# Patient Record
Sex: Female | Born: 1955 | Race: White | Hispanic: No | Marital: Single | Smoking: Former smoker
Health system: Southern US, Community
[De-identification: ages and names within clinical notes are randomized; demographics above are authoritative.]

## PROBLEM LIST (undated history)

## (undated) DIAGNOSIS — C189 Malignant neoplasm of colon, unspecified: Secondary | ICD-10-CM

## (undated) DIAGNOSIS — E039 Hypothyroidism, unspecified: Secondary | ICD-10-CM

## (undated) HISTORY — DX: Malignant neoplasm of colon, unspecified: C18.9

---

## 2014-06-03 ENCOUNTER — Emergency Department (HOSPITAL_COMMUNITY): Payer: BC Managed Care – PPO

## 2014-06-03 ENCOUNTER — Emergency Department (HOSPITAL_COMMUNITY)
Admission: EM | Admit: 2014-06-03 | Discharge: 2014-06-03 | Disposition: A | Discharge status: Home / Self Care | Payer: BC Managed Care – PPO | Attending: Emergency Medicine | Admitting: Emergency Medicine

## 2014-06-03 ENCOUNTER — Encounter (HOSPITAL_COMMUNITY): Payer: Self-pay | Admitting: Emergency Medicine

## 2014-06-03 DIAGNOSIS — K529 Noninfective gastroenteritis and colitis, unspecified: Secondary | ICD-10-CM

## 2014-06-03 DIAGNOSIS — R1031 Right lower quadrant pain: Secondary | ICD-10-CM | POA: Diagnosis present

## 2014-06-03 DIAGNOSIS — Z9889 Other specified postprocedural states: Secondary | ICD-10-CM | POA: Insufficient documentation

## 2014-06-03 DIAGNOSIS — R1909 Other intra-abdominal and pelvic swelling, mass and lump: Secondary | ICD-10-CM | POA: Diagnosis not present

## 2014-06-03 DIAGNOSIS — Z79899 Other long term (current) drug therapy: Secondary | ICD-10-CM | POA: Diagnosis not present

## 2014-06-03 DIAGNOSIS — K5289 Other specified noninfective gastroenteritis and colitis: Secondary | ICD-10-CM | POA: Diagnosis not present

## 2014-06-03 LAB — CBC WITH DIFFERENTIAL/PLATELET
BASOS ABS: 0 10*3/uL (ref 0.0–0.1)
BASOS PCT: 0 % (ref 0–1)
EOS PCT: 1 % (ref 0–5)
Eosinophils Absolute: 0.1 10*3/uL (ref 0.0–0.7)
HEMATOCRIT: 35.5 % — AB (ref 36.0–46.0)
Hemoglobin: 12.1 g/dL (ref 12.0–15.0)
Lymphocytes Relative: 14 % (ref 12–46)
Lymphs Abs: 1.6 10*3/uL (ref 0.7–4.0)
MCH: 30 pg (ref 26.0–34.0)
MCHC: 34.1 g/dL (ref 30.0–36.0)
MCV: 88.1 fL (ref 78.0–100.0)
MONO ABS: 1 10*3/uL (ref 0.1–1.0)
Monocytes Relative: 9 % (ref 3–12)
NEUTROS ABS: 9.1 10*3/uL — AB (ref 1.7–7.7)
Neutrophils Relative %: 76 % (ref 43–77)
Platelets: 292 10*3/uL (ref 150–400)
RBC: 4.03 MIL/uL (ref 3.87–5.11)
RDW: 13.8 % (ref 11.5–15.5)
WBC: 11.8 10*3/uL — ABNORMAL HIGH (ref 4.0–10.5)

## 2014-06-03 LAB — URINE MICROSCOPIC-ADD ON

## 2014-06-03 LAB — BASIC METABOLIC PANEL
ANION GAP: 12 (ref 5–15)
BUN: 7 mg/dL (ref 6–23)
CALCIUM: 9.8 mg/dL (ref 8.4–10.5)
CO2: 28 mEq/L (ref 19–32)
CREATININE: 0.85 mg/dL (ref 0.50–1.10)
Chloride: 98 mEq/L (ref 96–112)
GFR calc non Af Amer: 75 mL/min — ABNORMAL LOW (ref >90)
GFR, EST AFRICAN AMERICAN: 87 mL/min — AB (ref >90)
Glucose, Bld: 101 mg/dL — ABNORMAL HIGH (ref 70–99)
Potassium: 4.4 mEq/L (ref 3.7–5.3)
Sodium: 138 mEq/L (ref 137–147)

## 2014-06-03 LAB — URINALYSIS, ROUTINE W REFLEX MICROSCOPIC
BILIRUBIN URINE: NEGATIVE
Glucose, UA: NEGATIVE mg/dL
Hgb urine dipstick: NEGATIVE
KETONES UR: NEGATIVE mg/dL
Nitrite: NEGATIVE
Protein, ur: NEGATIVE mg/dL
Specific Gravity, Urine: <1.005 — ABNORMAL LOW (ref 1.005–1.030)
UROBILINOGEN UA: 0.2 mg/dL (ref 0.0–1.0)
pH: 7 (ref 5.0–8.0)

## 2014-06-03 MED ORDER — ONDANSETRON HCL 4 MG PO TABS: 4.0000 mg | ORAL_TABLET | Freq: Four times a day (QID) | ORAL | Status: DC

## 2014-06-03 MED ORDER — SODIUM CHLORIDE 0.9 % IJ SOLN
INTRAMUSCULAR | Status: AC
  Filled 2014-06-03: qty 750

## 2014-06-03 MED ORDER — SODIUM CHLORIDE 0.9 % IJ SOLN
INTRAMUSCULAR | Status: DC
Start: 2014-06-03 — End: 2014-06-03
  Filled 2014-06-03: qty 60

## 2014-06-03 MED ORDER — CIPROFLOXACIN IN D5W 400 MG/200ML IV SOLN
400.0000 mg | Freq: Once | INTRAVENOUS | Status: AC
  Administered 2014-06-03: 400 mg via INTRAVENOUS
  Filled 2014-06-03: qty 200

## 2014-06-03 MED ORDER — METRONIDAZOLE 500 MG PO TABS: 500.0000 mg | ORAL_TABLET | Freq: Two times a day (BID) | ORAL | Status: DC

## 2014-06-03 MED ORDER — METRONIDAZOLE IN NACL 5-0.79 MG/ML-% IV SOLN
500.0000 mg | Freq: Once | INTRAVENOUS | Status: AC
  Administered 2014-06-03: 500 mg via INTRAVENOUS
  Filled 2014-06-03: qty 100

## 2014-06-03 MED ORDER — IOHEXOL 300 MG/ML  SOLN
50.0000 mL | Freq: Once | INTRAMUSCULAR | Status: AC | PRN
  Administered 2014-06-03: 50 mL via ORAL

## 2014-06-03 MED ORDER — CIPROFLOXACIN HCL 500 MG PO TABS: 500.0000 mg | ORAL_TABLET | Freq: Two times a day (BID) | ORAL | Status: DC

## 2014-06-03 MED ORDER — IOHEXOL 300 MG/ML  SOLN
100.0000 mL | Freq: Once | INTRAMUSCULAR | Status: AC | PRN
  Administered 2014-06-03: 100 mL via INTRAVENOUS

## 2014-06-03 MED ORDER — HYDROCODONE-ACETAMINOPHEN 5-325 MG PO TABS: 2.0000 | ORAL_TABLET | ORAL | Status: DC | PRN

## 2014-06-03 NOTE — Discharge Instructions (Signed)
Abdominal Pain Take the antibiotics as prescribed for inflammation of your colon. Follow up with gastroenterologist for a colonoscopy. Return to the ED if you develop new or worsening symptoms. Many things can cause abdominal pain. Usually, abdominal pain is not caused by a disease and will improve without treatment. It can often be observed and treated at home. Your health care provider will do a physical exam and possibly order blood tests and X-rays to help determine the seriousness of your pain. However, in many cases, more time must pass before a clear cause of the pain can be found. Before that point, your health care provider may not know if you need more testing or further treatment. HOME CARE INSTRUCTIONS  Monitor your abdominal pain for any changes. The following actions may help to alleviate any discomfort you are experiencing:  Only take over-the-counter or prescription medicines as directed by your health care provider.  Do not take laxatives unless directed to do so by your health care provider.  Try a clear liquid diet (broth, tea, or water) as directed by your health care provider. Slowly move to a bland diet as tolerated. SEEK MEDICAL CARE IF:  You have unexplained abdominal pain.  You have abdominal pain associated with nausea or diarrhea.  You have pain when you urinate or have a bowel movement.  You experience abdominal pain that wakes you in the night.  You have abdominal pain that is worsened or improved by eating food.  You have abdominal pain that is worsened with eating fatty foods.  You have a fever. SEEK IMMEDIATE MEDICAL CARE IF:   Your pain does not go away within 2 hours.  You keep throwing up (vomiting).  Your pain is felt only in portions of the abdomen, such as the right side or the left lower portion of the abdomen.  You pass bloody or black tarry stools. MAKE SURE YOU:  Understand these instructions.   Will watch your condition.   Will get  help right away if you are not doing well or get worse.  Document Released: 06/07/2005 Document Revised: 09/02/2013 Document Reviewed: 05/07/2013 [facility] Patient Information 2015 [place], [place]. This information is not intended to replace advice given to you by your health care provider. Make sure you discuss any questions you have with your health care provider.

## 2014-06-03 NOTE — ED Notes (Addendum)
Pt sent from [place] office for further evaluation of RLQ pain that started 5 days ago. Denies fever, denies n/v and reports she has had loose stools. Reports pain "comes and goes". Denies burning or frequency with urination.

## 2014-06-03 NOTE — ED Provider Notes (Signed)
CSN: [id number]     Arrival date & time 06/03/14  1249 History  This chart was scribed for Ezequiel Essex, MD by Molli Posey, ED Scribe. This patient was seen in room APA16A/APA16A and the patient's care was started 3:45 PM.   Chief Complaint  Patient presents with  . Abdominal Pain    The history is provided by the patient. No language interpreter was used.   HPI Comments: Martha Becker is a 58 y.o. female who presents to the Emergency Department complaining of 1 week of constant RLQ abdominal pain. She state the pain is worse with movement and applied pressure. She describes the pain as a pulling sensation. She reports having a similar episode 2 months ago and was prescribed Abx for a possible ovarian cyst. She denies having abdominal US or CT scan at that time. She reports complete resolution of symptoms until onset of current episode. She reports that she is eating and drinking normally. She has not taken any OTC medications for her symptoms. She denies  vomiting, fever, nausea, diarrhea, dysuria, hematuria, vaginal bleeding, vaginal discharge. She denies history of abdominal surgery aside from cesarean section.    History reviewed. No pertinent past medical history. Past Surgical History  Procedure Laterality Date  . Cesarean section     No family history on file. History  Substance Use Topics  . Smoking status: Not on file  . Smokeless tobacco: Not on file  . Alcohol Use: Not on file   OB History   Grav Para Term Preterm Abortions TAB SAB Ect Mult Living                 Review of Systems  A complete 10 system review of systems was obtained and all systems are negative except as noted in the HPI and PMH.   Allergies  Codeine  Home Medications   Prior to Admission medications   Medication Sig Start Date End Date Taking? Authorizing Provider  Calcium Carb-Cholecalciferol (CALCIUM + D3) 600-200 MG-UNIT TABS Take 1 tablet by mouth daily.   Yes Historical Provider, MD   Cholecalciferol (VITAMIN D3) 2000 UNITS TABS Take 2,000 mg by mouth daily.   Yes Historical Provider, MD  ferrous sulfate 325 (65 FE) MG tablet Take 325 mg by mouth daily with breakfast.   Yes Historical Provider, MD  Multiple Vitamin (MULTIVITAMIN) tablet Take 1 tablet by mouth daily.   Yes Historical Provider, MD  Omega-3 Fatty Acids (FISH OIL) 1200 MG CAPS Take 1,200 mg by mouth 2 (two) times daily.   Yes Historical Provider, MD  vitamin B-12 (CYANOCOBALAMIN) 1000 MCG tablet Take 1,000 mcg by mouth daily.   Yes Historical Provider, MD  ciprofloxacin (CIPRO) 500 MG tablet Take 1 tablet (500 mg total) by mouth every 12 (twelve) hours. 06/03/14   Ezequiel Essex, MD  HYDROcodone-acetaminophen (NORCO/VICODIN) 5-325 MG per tablet Take 2 tablets by mouth every 4 (four) hours as needed. 06/03/14   Ezequiel Essex, MD  metroNIDAZOLE (FLAGYL) 500 MG tablet Take 1 tablet (500 mg total) by mouth 2 (two) times daily. 06/03/14   Ezequiel Essex, MD  ondansetron (ZOFRAN) 4 MG tablet Take 1 tablet (4 mg total) by mouth every 6 (six) hours. 06/03/14   Ezequiel Essex, MD   BP 115/77  Pulse 82  Temp(Src) 98.7 F (37.1 C) (Oral)  Resp 14  Ht 5\' 5"  (1.651 m)  Wt 143 lb (64.864 kg)  BMI 23.80 kg/m2  SpO2 100%  Physical Exam  Nursing note and vitals reviewed.  Constitutional: She is oriented to person, place, and time. She appears well-developed and well-nourished. No distress.  Non-toxic, no distress  HENT:  Head: Normocephalic and atraumatic.  Mouth/Throat: Oropharynx is clear and moist. No oropharyngeal exudate.  Eyes: Conjunctivae and EOM are normal. Pupils are equal, round, and reactive to light.  Neck: Normal range of motion. Neck supple.  No meningismus.  Cardiovascular: Normal rate, regular rhythm, normal heart sounds and intact distal pulses.   No murmur heard. Pulmonary/Chest: Effort normal and breath sounds normal. No respiratory distress.  Abdominal: Soft. She exhibits mass (Palpable mass  RLQ). There is tenderness (RLQ). There is no rebound and no guarding.  Genitourinary:  No CVA tenderness  Musculoskeletal: Normal range of motion. She exhibits no edema and no tenderness.  Neurological: She is alert and oriented to person, place, and time. No cranial nerve deficit. She exhibits normal muscle tone. Coordination normal.  No ataxia on finger to nose bilaterally. No pronator drift. 5/5 strength throughout. CN 2-12 intact. Negative Romberg. Equal grip strength. Sensation intact. Gait is normal.   Skin: Skin is warm.  Psychiatric: She has a normal mood and affect. Her behavior is normal.    ED Course  Procedures (including critical care time) DIAGNOSTIC STUDIES: Oxygen Saturation is 99% on RA, normal by my interpretation.    COORDINATION OF CARE: 3:50 PM Discussed treatment plan with pt at bedside and pt agreed to plan.  Labs Review Labs Reviewed  CBC WITH DIFFERENTIAL - Abnormal; Notable for the following:    WBC 11.8 (*)    HCT 35.5 (*)    Neutro Abs 9.1 (*)    All other components within normal limits  BASIC METABOLIC PANEL - Abnormal; Notable for the following:    Glucose, Bld 101 (*)    GFR calc non Af Amer 75 (*)    GFR calc Af Amer 87 (*)    All other components within normal limits  URINALYSIS, ROUTINE W REFLEX MICROSCOPIC - Abnormal; Notable for the following:    Specific Gravity, Urine <1.005 (*)    Leukocytes, UA SMALL (*)    All other components within normal limits  URINE MICROSCOPIC-ADD ON - Abnormal; Notable for the following:    Bacteria, UA FEW (*)    All other components within normal limits    Imaging Review US Transvaginal Non-ob  06/03/2014   CLINICAL DATA:  Pain for 5 days. Right lower quadrant centered. Palpable abnormality.  EXAM: TRANSABDOMINAL AND TRANSVAGINAL ULTRASOUND OF PELVIS  DOPPLER ULTRASOUND OF OVARIES  TECHNIQUE: Both transabdominal and transvaginal ultrasound examinations of the pelvis were performed. Transabdominal technique  was performed for global imaging of the pelvis including uterus, ovaries, adnexal regions, and pelvic cul-de-sac.  It was necessary to proceed with endovaginal exam following the transabdominal exam to visualize the uterus, ovaries, and adnexa. Color and duplex Doppler ultrasound was utilized to evaluate blood flow to the ovaries.  COMPARISON:  None.  FINDINGS: Uterus:  7.7 x 2.5 x 4.1 cm.  Normal in morphology.  Endometrium:  Normal, 5 mm.  Right Ovary:  3.2 x 1.0 x 2.0 cm.  Normal in morphology.  Left Ovary:  2.9 x 1.3 x 1.9 cm.  Normal in morphology.  Other Findings:  Trace free pelvic fluid.  Pulsed Doppler evaluation of both ovaries demonstrates normal low-resistance arterial and venous waveforms.  IMPRESSION:  1. Normal appearance of the uterus and ovaries, without evidence of ovarian or adnexal torsion. 2. Trace fluid in the pelvis, likely related to the right-sided  colonic abnormality which will be detailed on CT.   Electronically Signed   By: Abigail Miyamoto M.D.   On: 06/03/2014 17:29   US Pelvis Complete  06/03/2014   CLINICAL DATA:  Pain for 5 days. Right lower quadrant centered. Palpable abnormality.  EXAM: TRANSABDOMINAL AND TRANSVAGINAL ULTRASOUND OF PELVIS  DOPPLER ULTRASOUND OF OVARIES  TECHNIQUE: Both transabdominal and transvaginal ultrasound examinations of the pelvis were performed. Transabdominal technique was performed for global imaging of the pelvis including uterus, ovaries, adnexal regions, and pelvic cul-de-sac.  It was necessary to proceed with endovaginal exam following the transabdominal exam to visualize the uterus, ovaries, and adnexa. Color and duplex Doppler ultrasound was utilized to evaluate blood flow to the ovaries.  COMPARISON:  None.  FINDINGS: Uterus:  7.7 x 2.5 x 4.1 cm.  Normal in morphology.  Endometrium:  Normal, 5 mm.  Right Ovary:  3.2 x 1.0 x 2.0 cm.  Normal in morphology.  Left Ovary:  2.9 x 1.3 x 1.9 cm.  Normal in morphology.  Other Findings:  Trace free pelvic  fluid.  Pulsed Doppler evaluation of both ovaries demonstrates normal low-resistance arterial and venous waveforms.  IMPRESSION:  1. Normal appearance of the uterus and ovaries, without evidence of ovarian or adnexal torsion. 2. Trace fluid in the pelvis, likely related to the right-sided colonic abnormality which will be detailed on CT.   Electronically Signed   By: Abigail Miyamoto M.D.   On: 06/03/2014 17:29   Ct Abdomen Pelvis W Contrast  06/03/2014   CLINICAL DATA:  58 year old female with right lower quadrant pain intermittent for 3 weeks. Previous ovarian cyst several months prior.  EXAM: CT ABDOMEN AND PELVIS WITH CONTRAST  TECHNIQUE: Multidetector CT imaging of the abdomen and pelvis was performed using the standard protocol following bolus administration of intravenous contrast.  CONTRAST:  26mL OMNIPAQUE IOHEXOL 300 MG/ML SOLN, 152mL OMNIPAQUE IOHEXOL 300 MG/ML SOLN  COMPARISON:  No prior CT for comparison.  FINDINGS: Chest:  Unremarkable appearance of the soft tissues of the chest wall. Respiratory motion somewhat limits evaluation of the visualized lungs for small nodules. No pleural effusion or visualized pneumothorax.  Heart size within normal limits.  No pericardial fluid/ thickening.  Abdomen:  Craniocaudal span of the liver measures approximately 15 cm. There are a few small hypodense rounded lesions within the right liver lobe, image 10, 18, 16, 25. All of these are too small to characterize though are most likely benign biliary cysts. Focal fatty infiltration adjacent to the false form ligament. The intervening liver parenchyma is unremarkable in attenuation/ enhancement.  Unremarkable appearance of the spleen.  Uniform attenuation/enhancement of pancreatic parenchyma. No pericholecystic fluid or inflammatory changes. No radiopaque gallstones. No intrahepatic or extrahepatic biliary ductal dilatation.  Unremarkable appearance of the kidneys without hydronephrosis or nephrolithiasis.  No abnormally  distended small bowel or colon.  Inflammatory changes are present at the wall of the cecum and proximal ascending colon with edema in the adjacent mesenteric. There are several borderline enlarged lymph nodes in the in the ileocolic nodal station. No evidence of associated free air.  Unremarkable appearance of the urinary bladder. Unremarkable appearance of the uterus and adnexum.  Trace free fluid within the pelvis.  Vascular:  No evidence of aneurysm, dissection flap, or periaortic fluid. No significant atherosclerotic changes.  IMPRESSION: Nonspecific inflammatory changes involving the cecum and proximal ascending colon, with borderline enlarged lymph nodes in the ileocolic nodal station. Differential diagnosis includes infectious/inflammatory process such as enterocolitis, primary conditions such  as a inflammatory bowel syndrome, or potentially diverticulitis. Once the patient has been treated for this acute process, she should be referred for GI evaluation and colonoscopy, as malignancy could have this appearance.  Signed,  Dulcy Fanny. Earleen Newport, DO  [facility]  [facility]   Electronically Signed   By: Corrie Mckusick O.D.   On: 06/03/2014 18:01   [place] Art/ven Flow Abd Pelv Doppler  06/03/2014   CLINICAL DATA:  Pain for 5 days. Right lower quadrant centered. Palpable abnormality.  EXAM: TRANSABDOMINAL AND TRANSVAGINAL ULTRASOUND OF PELVIS  DOPPLER ULTRASOUND OF OVARIES  TECHNIQUE: Both transabdominal and transvaginal ultrasound examinations of the pelvis were performed. Transabdominal technique was performed for global imaging of the pelvis including uterus, ovaries, adnexal regions, and pelvic cul-de-sac.  It was necessary to proceed with endovaginal exam following the transabdominal exam to visualize the uterus, ovaries, and adnexa. Color and duplex Doppler ultrasound was utilized to evaluate blood flow to the ovaries.  COMPARISON:  None.  FINDINGS: Uterus:  7.7 x 2.5  x 4.1 cm.  Normal in morphology.  Endometrium:  Normal, 5 mm.  Right Ovary:  3.2 x 1.0 x 2.0 cm.  Normal in morphology.  Left Ovary:  2.9 x 1.3 x 1.9 cm.  Normal in morphology.  Other Findings:  Trace free pelvic fluid.  Pulsed Doppler evaluation of both ovaries demonstrates normal low-resistance arterial and venous waveforms.  IMPRESSION:  1. Normal appearance of the uterus and ovaries, without evidence of ovarian or adnexal torsion. 2. Trace fluid in the pelvis, likely related to the right-sided colonic abnormality which will be detailed on CT.   Electronically Signed   By: Abigail Miyamoto M.D.   On: 06/03/2014 17:29     EKG Interpretation None      MDM   Final diagnoses:  Right lower quadrant abdominal pain  Colitis   Several month history of right lower quadrant abdominal pain intermittent but constant for the past one week. No associated symptoms. No fever.  WBC 11.8, UA negative. Normal uterus and ovaries on [place], no evidence of torsion.  CT scan shows inflammatory change at cecum and proximal descending colon. Differential discussed with patient including risk of malignancy. Discussed with Dr. Earleen Newport radiology. Appendix is normal.  Discussed with Dr. Gala Romney who agrees with treating with antibiotics at this time and he will arrange followup for colonoscopy.  Patient understands need for GI followup and risk of possible malignancy.  Will discharge with cipro, flagyl, pain control.  Follow up with GI.  Return to the ED with new or worsening symptoms.  BP 115/77  Pulse 82  Temp(Src) 98.7 F (37.1 C) (Oral)  Resp 14  Ht 5\' 5"  (1.651 m)  Wt 143 lb (64.864 kg)  BMI 23.80 kg/m2  SpO2 100%  I personally performed the services described in this documentation, which was scribed in my presence. The recorded information has been reviewed and is accurate.     Ezequiel Essex, MD 06/03/14 [phone]

## 2014-06-03 NOTE — ED Notes (Signed)
Pt back from US/CT

## 2014-06-04 ENCOUNTER — Encounter: Payer: Self-pay | Admitting: Internal Medicine

## 2014-07-01 ENCOUNTER — Ambulatory Visit: Payer: BC Managed Care – PPO | Admitting: Gastroenterology

## 2014-07-14 ENCOUNTER — Telehealth: Payer: Self-pay | Admitting: Gastroenterology

## 2014-07-14 NOTE — Telephone Encounter (Signed)
I called and LMOM for pt to return call.

## 2014-07-14 NOTE — Telephone Encounter (Signed)
I received a referral on this patient once before and she cancelled her OV because she wanted to pick her own GI doctor. I received another referral today from Dr Nadara Mustard and called patient to see if she wanted to make OV or not. She agreed to be seen on 12/11 at 1030 with LSL, but wants to be seen sooner and have her procedure done ASAP and she has multiple medical questions with what is going on with her. I told her that I would let the nurse review her records and call her back at [phone]

## 2014-07-15 ENCOUNTER — Telehealth: Payer: Self-pay

## 2014-07-15 NOTE — Telephone Encounter (Addendum)
I returned pt's call. She stated she first started having problems with her stomach at the end of June. She has been on antibiotics several times. At this time she is feeling much better and has gone back to work.  Her CT of abdomen and pelvis done on 06/03/2014 at [facility]:  Impression: Nonspecific inflammatory changes involving the cecum and proximal ascending colon, with borderline enlarged lymph nodes.  Differential Diagnosis includes infectious/inflammatory process such as enterocolitis, primary conditions such a inflammatory bowel syndrome or potentially diverticulitis. Once the pt has been treated for this acute process, she should be referred for GI evaluation and colonosocpy, as malignancy could have this appearance.  I told the patient that since she is doing better and I do not any earlier appts at this time, I will leave her on the schedule for 08/21/2014 at 10:30 Am with Neil Crouch, PA.   She agreed to call her PCP or here if her symptoms worsen and if she has any severe pain she will go to the ED.  I told her we will put her on the cancellation list and she said if you find out a day before it would be very helpful, instead of day of.

## 2014-07-15 NOTE — Telephone Encounter (Signed)
Agree 

## 2014-07-15 NOTE — Telephone Encounter (Signed)
See note dated 07/14/2014.

## 2014-07-15 NOTE — Telephone Encounter (Signed)
PATIENT RETURNED [place], [place]

## 2014-08-11 DIAGNOSIS — C189 Malignant neoplasm of colon, unspecified: Secondary | ICD-10-CM

## 2014-08-11 HISTORY — DX: Malignant neoplasm of colon, unspecified: C18.9

## 2014-08-21 ENCOUNTER — Other Ambulatory Visit: Payer: Self-pay

## 2014-08-21 ENCOUNTER — Ambulatory Visit (INDEPENDENT_AMBULATORY_CARE_PROVIDER_SITE_OTHER): Payer: BC Managed Care – PPO | Admitting: Gastroenterology

## 2014-08-21 ENCOUNTER — Telehealth: Payer: Self-pay | Admitting: Gastroenterology

## 2014-08-21 ENCOUNTER — Encounter: Payer: Self-pay | Admitting: Gastroenterology

## 2014-08-21 VITALS — BP 107/70 | HR 78 | Temp 98.9°F | Ht 66.0 in | Wt 148.0 lb

## 2014-08-21 DIAGNOSIS — R933 Abnormal findings on diagnostic imaging of other parts of digestive tract: Secondary | ICD-10-CM

## 2014-08-21 DIAGNOSIS — R1031 Right lower quadrant pain: Secondary | ICD-10-CM | POA: Insufficient documentation

## 2014-08-21 MED ORDER — CIPROFLOXACIN HCL 500 MG PO TABS: 500.0000 mg | ORAL_TABLET | Freq: Two times a day (BID) | ORAL | Status: DC

## 2014-08-21 MED ORDER — METRONIDAZOLE 500 MG PO TABS: 500.0000 mg | ORAL_TABLET | Freq: Three times a day (TID) | ORAL | Status: DC

## 2014-08-21 MED ORDER — PEG-KCL-NACL-NASULF-NA ASC-C 100 G PO SOLR: 1.0000 | Freq: Once | ORAL | Status: DC

## 2014-08-21 NOTE — Telephone Encounter (Signed)
Candy is aware.

## 2014-08-21 NOTE — Patient Instructions (Signed)
1. RX for cipro and flagyl sent to your pharmacy. 2. Colonoscopy with Dr. Oneida Alar as scheduled.  3. Hold iron for now.

## 2014-08-21 NOTE — Progress Notes (Signed)
Primary Care Physician:  [name], MD  Primary Gastroenterologist:  Barney Drain, MD   Chief Complaint  Patient presents with  . Abdominal Pain    R side since June 2015    HPI:  Martha Becker is a 58 y.o. female here for further evaluation of abdominal pain and abnormal CT scan. Patient referred by Dr. [name]. Patient reports first episode of abdominal pain dating back to June 2015. Patient states she was told that she may have had a ruptured ovarian cyst and was given antibiotic therapy. Symptoms improved. Next episode began in September at which time she was evaluated in the emergency department. She had a CT of the abdomen and pelvis with contrast which revealed a few small hypodense rounded lesions within the right liver lobe too small to characterize, focal fatty infiltration adjacent to the falciform ligament, inflammatory changes present at the wall of the cecum and proximal ascending colon with edema in the adjacent mesenteric. Several borderline enlarged lymph nodes in the ileocolic nodal station. White count was mildly elevated 11,800 at that time. Normal hemoglobin. Again treated with antibiotic therapy and patient felt she improved. Patient reports that she was referred to GI at that time but she was feeling better and canceled the appointment. She developed recurrent abdominal pain again in October and treated with antibiotics for third time. Patient states current episode began about 2 days ago when she started Flagyl that she had left over at home. Feels slightly better at this time.  Pain is located in the right lower quadrant/right mid abdomen, cramping in nature. Bowel movements generally normal except for on antibiotics the stools become looser. Complains of associated fatigue. She has known mildly elevated TSH but she's been trying to use naturalistic products/foods to improve this. Patient also reports couple month h/o feeling a fullness or mass in RLQ. Notes it was  present when she had her CT done. No gerd, dysphagia, weight loss. Appetite is good.     Current Outpatient Prescriptions  Medication Sig Dispense Refill  . Calcium Carb-Cholecalciferol (CALCIUM + D3) 600-200 MG-UNIT TABS Take 1 tablet by mouth daily.    . ferrous sulfate 325 (65 FE) MG tablet Take 325 mg by mouth daily with breakfast.    . metroNIDAZOLE (FLAGYL) 500 MG tablet Take 1 tablet (500 mg total) by mouth 2 (two) times daily. 30 tablet 0  . Multiple Vitamin (MULTIVITAMIN) tablet Take 1 tablet by mouth daily.    . Omega-3 Fatty Acids (FISH OIL) 1200 MG CAPS Take 1,200 mg by mouth 2 (two) times daily.    . Probiotic Product (PROBIOTIC DAILY PO) Take by mouth daily.    . vitamin B-12 (CYANOCOBALAMIN) 1000 MCG tablet Take 1,000 mcg by mouth daily.     No current facility-administered medications for this visit.    Allergies as of 08/21/2014 - Review Complete 08/21/2014  Allergen Reaction Noted  . Codeine Nausea And Vomiting 06/03/2014    No past medical history on file.  Past Surgical History  Procedure Laterality Date  . Cesarean section      Family History  Problem Relation Age of Onset  . Colon cancer Cousin     age late 15s  . Inflammatory bowel disease Neg Hx     History   Social History  . Marital Status: Single    Spouse Name: N/A    Number of Children: 2  . Years of Education: N/A   Occupational History  . Not on file.  Social History Main Topics  . Smoking status: Never Smoker   . Smokeless tobacco: Not on file  . Alcohol Use: 0.0 oz/week    0 Not specified per week     Comment: occasional  . Drug Use: No  . Sexual Activity: Not on file   Other Topics Concern  . Not on file   Social History Narrative      ROS:  General: +fatigue. No weight loss.  Eyes: Negative for vision changes.  ENT: Negative for hoarseness, difficulty swallowing , nasal congestion. CV: Negative for chest pain, angina, palpitations, dyspnea on exertion, peripheral  edema.  Respiratory: Negative for dyspnea at rest, dyspnea on exertion, cough, sputum, wheezing.  GI: See history of present illness. GU:  Negative for dysuria, hematuria, urinary incontinence, urinary frequency, nocturnal urination.  MS: Negative for joint pain, low back pain.  Derm: Negative for rash or itching.  Neuro: Negative for weakness, abnormal sensation, seizure, frequent headaches, memory loss, confusion.  Psych: Negative for anxiety, depression, suicidal ideation, hallucinations.  Endo: Negative for unusual weight change.  Heme: Negative for bruising or bleeding. Allergy: Negative for rash or hives.    Physical Examination:  BP 107/70 mmHg  Pulse 78  Temp(Src) 98.9 F (37.2 C) (Oral)  Ht 5\' 6"  (1.676 m)  Wt 148 lb (67.132 kg)  BMI 23.90 kg/m2   General: Well-nourished, well-developed in no acute distress.  Head: Normocephalic, atraumatic.   Eyes: Conjunctiva pink, no icterus. Mouth: Oropharyngeal mucosa moist and pink , no lesions erythema or exudate. Neck: Supple without thyromegaly, masses, or lymphadenopathy.  Lungs: Clear to auscultation bilaterally.  Heart: Regular rate and rhythm, no murmurs rubs or gallops.  Abdomen: Bowel sounds are normal. She has fullness noted in the right mid abd extending to RLQ for several inches. Tender with palpation, no guarding. No hepatosplenomegaly, no abdominal bruits or    hernia , no rebound or guarding.   Rectal: not performed Extremities: No lower extremity edema. No clubbing or deformities.  Neuro: Alert and oriented x 4 , grossly normal neurologically.  Skin: Warm and dry, no rash or jaundice.   Psych: Alert and cooperative, normal mood and affect.  Labs: Lab Results  Component Value Date   WBC 11.8* 06/03/2014   HGB 12.1 06/03/2014   HCT 35.5* 06/03/2014   MCV 88.1 06/03/2014   PLT 292 06/03/2014   Lab Results  Component Value Date   CREATININE 0.85 06/03/2014   BUN 7 06/03/2014   NA 138 06/03/2014   K 4.4  06/03/2014   CL 98 06/03/2014   CO2 28 06/03/2014   Labs from 06/29/2014 White blood cell count 7400, hemoglobin 11.4 normal, hematocrit 34.6 normal, MCV 89, platelets 478,000, BUN 9, creatinine 0.9, total bilirubin less than 0.2, alkaline phosphatase 47, AST 14, ALT 10, albumin 3.9, TSH 7.280  Imaging Studies:  CLINICAL DATA: 58 year old female with right lower quadrant pain intermittent for 3 weeks. Previous ovarian cyst several months prior.  EXAM: CT ABDOMEN AND PELVIS WITH CONTRAST  TECHNIQUE: Multidetector CT imaging of the abdomen and pelvis was performed using the standard protocol following bolus administration of intravenous contrast.  CONTRAST: 15mL OMNIPAQUE IOHEXOL 300 MG/ML SOLN, 175mL OMNIPAQUE IOHEXOL 300 MG/ML SOLN  COMPARISON: No prior CT for comparison.  FINDINGS: Chest:  Unremarkable appearance of the soft tissues of the chest wall. Respiratory motion somewhat limits evaluation of the visualized lungs for small nodules. No pleural effusion or visualized pneumothorax.  Heart size within normal limits. No pericardial fluid/ thickening.  Abdomen:  Craniocaudal span of the liver measures approximately 15 cm. There are a few small hypodense rounded lesions within the right liver lobe, image 10, 18, 16, 25. All of these are too small to characterize though are most likely benign biliary cysts. Focal fatty infiltration adjacent to the false form ligament. The intervening liver parenchyma is unremarkable in attenuation/ enhancement.  Unremarkable appearance of the spleen.  Uniform attenuation/enhancement of pancreatic parenchyma. No pericholecystic fluid or inflammatory changes. No radiopaque gallstones. No intrahepatic or extrahepatic biliary ductal dilatation.  Unremarkable appearance of the kidneys without hydronephrosis or nephrolithiasis.  No abnormally distended small bowel or colon.  Inflammatory changes are present at the  wall of the cecum and proximal ascending colon with edema in the adjacent mesenteric. There are several borderline enlarged lymph nodes in the in the ileocolic nodal station. No evidence of associated free air.  Unremarkable appearance of the urinary bladder. Unremarkable appearance of the uterus and adnexum.  Trace free fluid within the pelvis.  Vascular:  No evidence of aneurysm, dissection flap, or periaortic fluid. No significant atherosclerotic changes.  IMPRESSION: Nonspecific inflammatory changes involving the cecum and proximal ascending colon, with borderline enlarged lymph nodes in the ileocolic nodal station. Differential diagnosis includes infectious/inflammatory process such as enterocolitis, primary conditions such as a inflammatory bowel syndrome, or potentially diverticulitis. Once the patient has been treated for this acute process, she should be referred for GI evaluation and colonoscopy, as malignancy could have this appearance.  Signed,  Dulcy Fanny. Earleen Newport, DO  [facility]  [facility]   Electronically Signed  By: Corrie Mckusick O.D.  On: 06/03/2014 18:01

## 2014-08-21 NOTE — Telephone Encounter (Signed)
Please let patient know that Dr. Oneida Alar is requesting she have a CT A/P with contrast prior to her scheduled colonoscopy. Need to get it done sometime early next week. Please schedule.  Her last creatinine was 0.9 on 06/19/14.

## 2014-08-24 ENCOUNTER — Telehealth: Payer: Self-pay

## 2014-08-24 ENCOUNTER — Other Ambulatory Visit: Payer: Self-pay

## 2014-08-24 DIAGNOSIS — R933 Abnormal findings on diagnostic imaging of other parts of digestive tract: Secondary | ICD-10-CM

## 2014-08-24 DIAGNOSIS — R1031 Right lower quadrant pain: Secondary | ICD-10-CM

## 2014-08-24 NOTE — Assessment & Plan Note (Signed)
58 year old female with recurrent right lower quadrant pain and abnormal CT scan as outlined above. While the patient was here today, I spoke with radiologist Dr. Weber Cooks about her September CT scan. My concern was that she has recurrent abdominal pain and a palpable fullness/mass in the right mid to lower quadrant. He reports the previous CT scan showed significant abnormality in that region extending approximately 10 cm in length. He did not feel it was related to appendicitis and actually in retrospect findings may be more consistent with mass rather than inflammatory component. He encouraged colonoscopy. I discussed findings with Dr. Oneida Alar. Dr. Oneida Alar recommended updated CT scan prior to colonoscopy given current CT is 70 months old. Based on findings will likely proceed with colonoscopy. Ddx includes malignancy, IBD.  Discussed plan with patient. She is in agreement. Discussed colonoscopy as we will likely proceed with this as the next step.  I have discussed the risks, alternatives, benefits with regards to but not limited to the risk of reaction to medication, bleeding, infection, perforation and the patient is agreeable to proceed. Written consent to be obtained.  Patient requested continuing antibiotic therapy until further work up is done. 10 day course of cipro/flagyl provided.

## 2014-08-24 NOTE — Telephone Encounter (Signed)
Called pt insurance and no pre-cert is needed.  Ref. # [id number].  Procedure is scheduled for 08/27/2014 @ 1000am. Called pt to notify her. No answer. LMOM and asked pt to call office back to verify orders.

## 2014-08-24 NOTE — Telephone Encounter (Signed)
Pt called this am in regards to her CT Scan that was ordered. Pt is upset and is wondering why she is having to have another one since she just had one 3 months ago. I told pt that the doctors are wanting to compare the previous scan with the new one ordered. She stated that having another scan is an expensive comparison and wants to talk to Neil Crouch, [place].

## 2014-08-24 NOTE — Telephone Encounter (Signed)
Ok thanks 

## 2014-08-24 NOTE — Telephone Encounter (Signed)
Discussed need for CT with patient. Patient took antibiotics for 2 days but had to stop due to nausea and pain. She will call and reschedule her CT hopefully for tomorrow.

## 2014-08-24 NOTE — Progress Notes (Signed)
cc'ed to pcp °

## 2014-08-25 ENCOUNTER — Telehealth: Payer: Self-pay | Admitting: Gastroenterology

## 2014-08-25 NOTE — Telephone Encounter (Signed)
PATIENT HAD A QUESTION ABOUT INFECTED AREA IN THE COLON SEEN ON CT SCAN IN October.  [place] HER [id number]

## 2014-08-26 NOTE — Telephone Encounter (Signed)
I spoke with the pt, she wants to know again what the size of the part of the colon that we were worried about. I talked to LSL- area is approximately 10cm or 5 inches. I informed pt and she said she understood. Her ct is scheduled for tomorrow.

## 2014-08-26 NOTE — Telephone Encounter (Signed)
Martha Becker, please touch base with patient first.

## 2014-08-26 NOTE — Telephone Encounter (Signed)
Tried to call pt- LMOM to return call 

## 2014-08-27 ENCOUNTER — Telehealth: Payer: Self-pay | Admitting: Internal Medicine

## 2014-08-27 ENCOUNTER — Ambulatory Visit (HOSPITAL_COMMUNITY): Payer: BC Managed Care – PPO

## 2014-08-27 ENCOUNTER — Ambulatory Visit (HOSPITAL_COMMUNITY)
Admission: RE | Admit: 2014-08-27 | Discharge: 2014-08-27 | Disposition: A | Discharge status: Home / Self Care | Payer: BC Managed Care – PPO | Source: Ambulatory Visit | Attending: Gastroenterology | Admitting: Gastroenterology

## 2014-08-27 ENCOUNTER — Telehealth: Payer: Self-pay | Admitting: Gastroenterology

## 2014-08-27 DIAGNOSIS — R140 Abdominal distension (gaseous): Secondary | ICD-10-CM

## 2014-08-27 DIAGNOSIS — C7801 Secondary malignant neoplasm of right lung: Secondary | ICD-10-CM | POA: Diagnosis present

## 2014-08-27 DIAGNOSIS — R933 Abnormal findings on diagnostic imaging of other parts of digestive tract: Secondary | ICD-10-CM | POA: Insufficient documentation

## 2014-08-27 DIAGNOSIS — C182 Malignant neoplasm of ascending colon: Principal | ICD-10-CM | POA: Diagnosis present

## 2014-08-27 DIAGNOSIS — K56600 Partial intestinal obstruction, unspecified as to cause: Secondary | ICD-10-CM

## 2014-08-27 DIAGNOSIS — R1031 Right lower quadrant pain: Secondary | ICD-10-CM | POA: Insufficient documentation

## 2014-08-27 DIAGNOSIS — C7802 Secondary malignant neoplasm of left lung: Secondary | ICD-10-CM | POA: Diagnosis present

## 2014-08-27 DIAGNOSIS — D638 Anemia in other chronic diseases classified elsewhere: Secondary | ICD-10-CM | POA: Diagnosis present

## 2014-08-27 DIAGNOSIS — K56609 Unspecified intestinal obstruction, unspecified as to partial versus complete obstruction: Secondary | ICD-10-CM

## 2014-08-27 DIAGNOSIS — C786 Secondary malignant neoplasm of retroperitoneum and peritoneum: Secondary | ICD-10-CM | POA: Diagnosis present

## 2014-08-27 DIAGNOSIS — R112 Nausea with vomiting, unspecified: Secondary | ICD-10-CM | POA: Diagnosis present

## 2014-08-27 MED ORDER — IOHEXOL 300 MG/ML  SOLN
100.0000 mL | Freq: Once | INTRAMUSCULAR | Status: AC | PRN
  Administered 2014-08-27: 100 mL via INTRAVENOUS

## 2014-08-27 NOTE — Telephone Encounter (Signed)
Pt called this afternoon. She had a CT done today around 1pm and afterwards ate a biscuit and now she is having severe abdominal pains and is crying on the phone because she doesn't know what to do.  She was seen recently and said she has had problems with being bloated, but today she is having abdomen pains. Please advise. Call transferred to [facility]

## 2014-08-27 NOTE — Telephone Encounter (Signed)
I spoke with the pt. She said she felt ok immediately after the ct but on the way home, she stopped for something to eat and began to have severe nausea. She said she has had 3 episodes of diarrhea since she got home. Pt sounds winded on th phone but when I asked her if she had SOB, she said she didn't know if her breathing was really SOB or if she was doing it to try to control her nausea. I spoke with Randall Hiss, ok for pt to take benadryl 25mg  and she needs to sit down and try to relax. If her throats starts to swell she needs to go to the ED. Pt stated her throat feels fine, she will try the benadryl and I will call pt back to check on her before I leave today.

## 2014-08-27 NOTE — Telephone Encounter (Signed)
Radiology called with CT report.  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 I called pt this evening.   Pt vomited after scan after eating a "biscuit"; feels better this pm; +BM today; no nausea or vomiting currently.  I informed her of current CT findings. I recommended she come to hospital or wait until am to get instructions from Dr. Oneida Alar.  She stated she was doing Ok at home.  I instructed her to back off on diet to clear liquids; if worsening of symptoms come to the ED; she voiced understanding of the plan.

## 2014-08-27 NOTE — Telephone Encounter (Signed)
Tried to call pt- LMOM- informed pt that I was calling to check up on her and reminded her that if she started developing hives or severe SOB or she feels like her throat is closing she should go to the ED. Asked her to call me in the AM and let me know how she is doing.

## 2014-08-28 ENCOUNTER — Inpatient Hospital Stay (HOSPITAL_COMMUNITY): Payer: BC Managed Care – PPO

## 2014-08-28 ENCOUNTER — Inpatient Hospital Stay (HOSPITAL_COMMUNITY)
Admission: AD | Admit: 2014-08-28 | Discharge: 2014-09-04 | DRG: 330 | Disposition: A | Discharge status: Home / Self Care | Payer: BC Managed Care – PPO | Source: Ambulatory Visit | Attending: General Surgery | Admitting: General Surgery

## 2014-08-28 ENCOUNTER — Encounter: Payer: Self-pay | Admitting: Internal Medicine

## 2014-08-28 ENCOUNTER — Encounter (HOSPITAL_COMMUNITY): Payer: Self-pay | Admitting: *Deleted

## 2014-08-28 DIAGNOSIS — C7800 Secondary malignant neoplasm of unspecified lung: Secondary | ICD-10-CM | POA: Diagnosis present

## 2014-08-28 DIAGNOSIS — R112 Nausea with vomiting, unspecified: Secondary | ICD-10-CM | POA: Diagnosis present

## 2014-08-28 DIAGNOSIS — R1031 Right lower quadrant pain: Secondary | ICD-10-CM | POA: Diagnosis present

## 2014-08-28 DIAGNOSIS — R918 Other nonspecific abnormal finding of lung field: Secondary | ICD-10-CM | POA: Diagnosis present

## 2014-08-28 DIAGNOSIS — C801 Malignant (primary) neoplasm, unspecified: Secondary | ICD-10-CM

## 2014-08-28 DIAGNOSIS — C7801 Secondary malignant neoplasm of right lung: Secondary | ICD-10-CM | POA: Diagnosis present

## 2014-08-28 DIAGNOSIS — C7802 Secondary malignant neoplasm of left lung: Secondary | ICD-10-CM | POA: Diagnosis present

## 2014-08-28 DIAGNOSIS — K5660 Unspecified intestinal obstruction: Secondary | ICD-10-CM | POA: Diagnosis present

## 2014-08-28 DIAGNOSIS — D638 Anemia in other chronic diseases classified elsewhere: Secondary | ICD-10-CM | POA: Diagnosis present

## 2014-08-28 DIAGNOSIS — C786 Secondary malignant neoplasm of retroperitoneum and peritoneum: Secondary | ICD-10-CM | POA: Diagnosis present

## 2014-08-28 DIAGNOSIS — K56609 Unspecified intestinal obstruction, unspecified as to partial versus complete obstruction: Secondary | ICD-10-CM | POA: Diagnosis present

## 2014-08-28 DIAGNOSIS — C180 Malignant neoplasm of cecum: Secondary | ICD-10-CM | POA: Diagnosis present

## 2014-08-28 DIAGNOSIS — C182 Malignant neoplasm of ascending colon: Secondary | ICD-10-CM | POA: Diagnosis present

## 2014-08-28 DIAGNOSIS — Z01811 Encounter for preprocedural respiratory examination: Secondary | ICD-10-CM

## 2014-08-28 LAB — PROTIME-INR
INR: 1.08 (ref 0.00–1.49)
Prothrombin Time: 14.1 seconds (ref 11.6–15.2)

## 2014-08-28 LAB — CBC
HEMATOCRIT: 34.3 % — AB (ref 36.0–46.0)
Hemoglobin: 11.7 g/dL — ABNORMAL LOW (ref 12.0–15.0)
MCH: 29.2 pg (ref 26.0–34.0)
MCHC: 34.1 g/dL (ref 30.0–36.0)
MCV: 85.5 fL (ref 78.0–100.0)
Platelets: 459 10*3/uL — ABNORMAL HIGH (ref 150–400)
RBC: 4.01 MIL/uL (ref 3.87–5.11)
RDW: 14.4 % (ref 11.5–15.5)
WBC: 9.3 10*3/uL (ref 4.0–10.5)

## 2014-08-28 LAB — COMPREHENSIVE METABOLIC PANEL
ALBUMIN: 3.2 g/dL — AB (ref 3.5–5.2)
ALT: 12 U/L (ref 0–35)
AST: 20 U/L (ref 0–37)
Alkaline Phosphatase: 63 U/L (ref 39–117)
Anion gap: 14 (ref 5–15)
BUN: 5 mg/dL — ABNORMAL LOW (ref 6–23)
CO2: 25 mEq/L (ref 19–32)
CREATININE: 0.75 mg/dL (ref 0.50–1.10)
Calcium: 9.2 mg/dL (ref 8.4–10.5)
Chloride: 93 mEq/L — ABNORMAL LOW (ref 96–112)
GFR calc Af Amer: >90 mL/min (ref >90)
GFR calc non Af Amer: >90 mL/min (ref >90)
Glucose, Bld: 107 mg/dL — ABNORMAL HIGH (ref 70–99)
Potassium: 4 mEq/L (ref 3.7–5.3)
SODIUM: 132 meq/L — AB (ref 137–147)
Total Bilirubin: 0.2 mg/dL — ABNORMAL LOW (ref 0.3–1.2)
Total Protein: 6.6 g/dL (ref 6.0–8.3)

## 2014-08-28 MED ORDER — CIPROFLOXACIN IN D5W 400 MG/200ML IV SOLN
400.0000 mg | Freq: Two times a day (BID) | INTRAVENOUS | Status: DC
  Administered 2014-08-28 – 2014-09-03 (×11): 400 mg via INTRAVENOUS
  Filled 2014-08-28 (×11): qty 200

## 2014-08-28 MED ORDER — SODIUM CHLORIDE 0.9 % IJ SOLN: 3.0000 mL | INTRAMUSCULAR | Status: DC | PRN

## 2014-08-28 MED ORDER — ACETAMINOPHEN 325 MG PO TABS: 650.0000 mg | ORAL_TABLET | Freq: Four times a day (QID) | ORAL | Status: DC | PRN

## 2014-08-28 MED ORDER — SODIUM CHLORIDE 0.9 % IJ SOLN
3.0000 mL | Freq: Two times a day (BID) | INTRAMUSCULAR | Status: DC
  Administered 2014-08-28 – 2014-09-04 (×7): 3 mL via INTRAVENOUS

## 2014-08-28 MED ORDER — ONDANSETRON HCL 4 MG/2ML IJ SOLN
4.0000 mg | Freq: Four times a day (QID) | INTRAMUSCULAR | Status: DC | PRN
  Administered 2014-08-30 – 2014-09-04 (×8): 4 mg via INTRAVENOUS
  Filled 2014-08-28 (×9): qty 2

## 2014-08-28 MED ORDER — ONDANSETRON HCL 4 MG PO TABS: 4.0000 mg | ORAL_TABLET | Freq: Four times a day (QID) | ORAL | Status: DC | PRN

## 2014-08-28 MED ORDER — SODIUM CHLORIDE 0.9 % IV SOLN
250.0000 mL | INTRAVENOUS | Status: DC | PRN
  Administered 2014-08-28: 250 mL via INTRAVENOUS

## 2014-08-28 MED ORDER — ACETAMINOPHEN 650 MG RE SUPP: 650.0000 mg | Freq: Four times a day (QID) | RECTAL | Status: DC | PRN

## 2014-08-28 MED ORDER — ALUM & MAG HYDROXIDE-SIMETH 200-200-20 MG/5ML PO SUSP: 30.0000 mL | Freq: Four times a day (QID) | ORAL | Status: DC | PRN

## 2014-08-28 NOTE — Progress Notes (Signed)
Chart is reviewed.  This appears to be a surgical issue at this time.  We will see the patient in consultation next week.  CT chest without contrast ordered for complete staging.  Biopsy will be needed for diagnosis.  KEFALAS,THOMAS 08/28/2014

## 2014-08-28 NOTE — H&P (Signed)
[facility] History and Physical  Kauri Garson DPO:[id number] DOB: 20-Jan-1956 DOA: 08/28/2014  Referring physician: fields PCP: [name], MD   Chief Complaint: sbo  HPI: Martha Becker is a 58 y.o. female with a history of recurrent right lower quadrant pain and an abnormal CT who is directly admitted to room 312 from Dr. Oneida Alar office for diagnosis of small bowel obstruction discovered on abdominal CT done 12/17.  Patient reports several day history of abdominal pain nausea with vomiting unable to keep food down. She states she called her gastroenterologist who ordered a CT which revealed high-grade small bowel obstruction. Associated symptoms include nausea with vomiting and diarrhea. She denies any emesis of coffee-ground nature bright red blood she denies melena or bright red blood per rectum. She denies fever chills or unintentional weight loss.  Admitting labs pending at the time of this dictation. CT of the abdomen done yesterday revealsProgression of cecal mass, compatible with colon cancer. There is extension of tumor from the cecum to the proximal transverse colon which is now adherent to the cecal mass. Calcified ileocecal lymph node is present and there is now LEFT (contralateral) retroperitoneal spread of disease. New high grade partial small bowel obstruction associated with the cecal mass. Surgical diversion should be considered based on the small bowel obstruction. RIGHT lower quadrant peritoneal fat stranding consistent with peritoneal carcinomatosis. New pulmonary nodules compatible with hematogenous metastases. The appendix is fluid-filled and mildly dilated and enhancing, secondary to obstruction of the appendiceal orifice at the cecal mass. Primary acute appendicitis is unlikely and chronic antibioticsmay have suppressed acute appendicitis associated with appendiceal obstruction. On admission she is afebrile hemodynamically stable and not hypoxic. She is being admitted  for surgical consult and consideration of hemicolectomy as well as oncology consult  Review of Systems:  10 point review of systems complete and all systems are negative except as indicated by history of present illness History reviewed. No pertinent past medical history. Past Surgical History  Procedure Laterality Date  . Cesarean section     Social History:  reports that she has never smoked. She does not have any smokeless tobacco history on file. She reports that she drinks alcohol. She reports that she does not use illicit drugs.  Allergies  Allergen Reactions  . Codeine Nausea And Vomiting    Family History  Problem Relation Age of Onset  . Colon cancer Cousin     age late 64s  . Inflammatory bowel disease Neg Hx      Prior to Admission medications   Medication Sig Start Date End Date Taking? Authorizing Provider  B Complex-C-E-Zn (B COMPLEX-C-E-ZINC) tablet Take 1 tablet by mouth daily.    Historical Provider, MD  Calcium Carb-Cholecalciferol (CALCIUM + D3) 600-200 MG-UNIT TABS Take 1 tablet by mouth daily.    Historical Provider, MD  ciprofloxacin (CIPRO) 500 MG tablet Take 1 tablet (500 mg total) by mouth 2 (two) times daily. Patient not taking: Reported on 08/25/2014 08/21/14   Mahala Menghini, PA-C  ferrous sulfate 325 (65 FE) MG tablet Take 325 mg by mouth daily with breakfast.    Historical Provider, MD  metroNIDAZOLE (FLAGYL) 500 MG tablet Take 1 tablet (500 mg total) by mouth 3 (three) times daily. 08/21/14   Mahala Menghini, PA-C  Omega-3 Fatty Acids (FISH OIL) 1200 MG CAPS Take 1,200 mg by mouth 2 (two) times daily.    Historical Provider, MD  peg 3350 powder (MOVIPREP) 100 G SOLR Take 1 kit (200 g total) by  mouth once. 08/21/14 09/20/14  Mahala Menghini, PA-C  Probiotic Product (PROBIOTIC DAILY PO) Take 1 tablet by mouth daily.     Historical Provider, MD  vitamin B-12 (CYANOCOBALAMIN) 1000 MCG tablet Take 1,000 mcg by mouth daily.    Historical Provider, MD    Physical Exam: Filed Vitals:   08/28/14 1338  BP: 126/75  Pulse: 81  Temp: 98.2 F (36.8 C)  TempSrc: Oral  Resp: 20  Height: _0  (1.676 m)  Weight: 65.545 kg (144 lb 8 oz)  SpO2: 98%    Wt Readings from Last 3 Encounters:  08/28/14 65.545 kg (144 lb 8 oz)  08/21/14 67.132 kg (148 lb)  06/03/14 64.864 kg (143 lb)    General:  Appears somewhat anxious but comfortable Eyes: PERRL, normal lids, irises & conjunctiva ENT: grossly normal hearing, lips & tongue Neck: no LAD, masses or thyromegaly Cardiovascular: RRR, no m/r/g. No LE edema.   Respiratory: CTA bilaterally, no w/r/r. Normal respiratory effort. Abdomen: Mildly distended mildly tender particularly in the lower quadrants Skin: no rash or induration seen on limited exam Musculoskeletal: grossly normal tone BUE/BLE Psychiatric: grossly normal mood and affect, speech fluent and appropriate Neurologic: grossly non-focal.          Labs on Admission:  Basic Metabolic Panel: No results for input(s): NA, K, CL, CO2, GLUCOSE, BUN, CREATININE, CALCIUM, MG, PHOS in the last 168 hours. Liver Function Tests: No results for input(s): AST, ALT, ALKPHOS, BILITOT, PROT, ALBUMIN in the last 168 hours. No results for input(s): LIPASE, AMYLASE in the last 168 hours. No results for input(s): AMMONIA in the last 168 hours. CBC: No results for input(s): WBC, NEUTROABS, HGB, HCT, MCV, PLT in the last 168 hours. Cardiac Enzymes: No results for input(s): CKTOTAL, CKMB, CKMBINDEX, TROPONINI in the last 168 hours.  BNP (last 3 results) No results for input(s): PROBNP in the last 8760 hours. CBG: No results for input(s): GLUCAP in the last 168 hours.  Radiological Exams on Admission: Ct Abdomen Pelvis W Contrast  08/27/2014   CLINICAL DATA:  Inflamed area in the RIGHT side of the colon. On antibiotics for 3 months. Bloating for 1 week. RIGHT lower quadrant pain. Abnormal CT scan.  EXAM: EXAM CT ABDOMEN AND PELVIS WITH CONTRAST   TECHNIQUE: Multidetector CT imaging of the abdomen and pelvis was performed using the standard protocol following bolus administration of intravenous contrast.  CONTRAST:  133m OMNIPAQUE IOHEXOL 300 MG/ML  SOLN  COMPARISON:  06/03/2014.  FINDINGS: Musculoskeletal: Thoracolumbar vertebral bodies appear within normal limits. Visible ribs appear normal. No osseous metastatic disease is identified.  Lung Bases: New pulmonary nodules are present at the lung bases, compatible with hematogenous metastatic disease. The largest is in the LEFT lower lobe and measures 11 mm. This demonstrates low attenuation centrally suggesting mucinous neoplasm.  Liver: Multiple hepatic cystic lesions appears similar to the prior exam. Fatty infiltration at the falciform ligament of the liver. No evidence of hepatic metastatic disease by CT.  Spleen:  Normal.  Gallbladder:  Partially contracted.  No calcified stones.  Common bile duct:  Normal.  Pancreas:  Normal.  Adrenal glands:  Normal bilaterally.  Kidneys: Normal enhancement and delayed excretion of contrast. No ureteral obstruction. There is LEFT retroperitoneal adenopathy with a conglomeration of LEFT retroperitoneal  Renal and periaortic lymph nodes just deep to the LEFT renal vein measuring 24 mm x 39 mm (image 25 series 2). Other smaller LEFT retroperitoneal lymph nodes are present.  Stomach: Mild thickening of the  antrum of the stomach is present, probably due to underdistention. Attention on follow-up is recommended. No gastric outlet obstruction.  Small bowel: Small bowel obstruction is present with fecalization of distal small bowel contents. This becomes progressively worse approaching the ileocecal valve which appears to be the point of obstruction.  Colon: Cecal mass is present. This is at the ileocecal valve. Enhancing mural mass measures up to 3.3 cm in thickness. The appendix extends off the inferior aspect of the cecum in demonstrates mild mucosal hyper enhancement  although the appearance is unlikely to represent acute appendicitis. On the coronal images, there is extension of tumor from the cecum to the proximal transverse colon. Small amount of fluid is present around the ascending colon, probably representing malignant ascites. 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  Pelvic Genitourinary: Uterus and urinary bladder appear within normal limits.  Peritoneum: Stranding of the small bowel mesenteric and peritoneal surfaces present, most prominent in the RIGHT lower quadrant. This is most compatible with peritoneal carcinomatosis in this patient with a likely advanced neoplasm of the cecum.  Vasculature: Retroaortic LEFT renal vein. No acute vascular abnormality.  Body Wall: Tiny fat containing periumbilical hernia.  IMPRESSION: 1. Progression of cecal mass, compatible with colon cancer. There is extension of tumor from the cecum to the proximal transverse colon which is now adherent to the cecal mass. Calcified ileocecal lymph node is present and there is now LEFT (contralateral) retroperitoneal spread of disease. 2. New high grade partial small bowel obstruction associated with the cecal mass. Surgical diversion should be considered based on the small bowel obstruction. 3. RIGHT lower quadrant peritoneal fat stranding consistent with peritoneal carcinomatosis. 4. New pulmonary nodules compatible with hematogenous metastases. 5. The appendix is fluid-filled and mildly dilated and enhancing, secondary to obstruction of the appendiceal orifice at the cecal mass. Primary acute appendicitis is unlikely and chronic antibiotics may have suppressed acute appendicitis associated with appendiceal obstruction. 6. These results will be called to the ordering clinician or representative by the Radiologist Assistant, and communication documented in the PACS or zVision Dashboard.   Electronically  Signed   By: Dereck Ligas M.D.   On: 08/27/2014 17:40    EKG:   Assessment/Plan Principal Problem:   Small bowel obstruction: Per CT. Admitting to medical floor requested surgical consult may need hemicolectomy. Clear liquids initially will provide pain medicine and nausea medicine as needed  Active Problems:   Abnormal CT scan, colon: See above cecum mass along with pulmonary nodules concern for metastatic disease. Have requested oncology consult    RLQ abdominal pain: Related to #1 and #2 pain medicine as needed    Nausea with vomiting: Currently stable will provide clear liquids initially. Zofran as needed for nausea and vomiting    Pulmonary nodules: Concern for metastatic disease. Have requested oncology consult. Will defer any further imaging to oncology    Cecal mass: Per CT. Surgical consult as indicated above. Oncology consult as noted above.    Peritoneal carcinomatosis; prior CT. Oncology consult.     Code Status: full (must indicate code status--if unknown or must be presumed, indicate so) DVT Prophylaxis: Family Communication: none present Disposition Plan: home when ready  Time spent: 60 minutes  [facility]

## 2014-08-28 NOTE — Addendum Note (Signed)
Addended by: Danie Binder on: 08/28/2014 11:04 AM   Modules accepted: Orders

## 2014-08-28 NOTE — Telephone Encounter (Addendum)
I PERSONALLY REVIEWED THE CT WITH DR. Thornton Papas. PT HAS HIGH GRADE SMALL BOWEL OBSTRUCTION DISCUSSED WITH DR. Arnoldo Morale. RECOMMENDED PT BE ADMITTED. CONSIDER R HEMICOLECTOMY. PT NEED ONCOLOGY CONSULT.  1037: AWAITING CALL FROM DR. HERNANDEZ FOR DIRECT ADMISSION. 1047: ACCEPTED PT FOR ADMISSION. SPOKE TO PT. THINKS SX BETTER WITH ABX. VOMITED AFTER CT BUT NOT REALLY HAVING NV. HAD RLQ PAIN OFF AND ON. BEEN EATING WELL. STOMACH PUFFY OVER PAST WEEK. EXPLAINED FINDINGS. ANSWERED QUESTIONS. EXPLAINED SHE NEEDS TO BE ADMITTED AND HAVE SURGERY. PT MAY HAVE SOFT DIET IF TOLERATED. OTHERWISE SHE NEEDS FULL LIQUIDS. PROCEED TO ADMISSIONS.

## 2014-08-29 ENCOUNTER — Inpatient Hospital Stay (HOSPITAL_COMMUNITY): Payer: BC Managed Care – PPO

## 2014-08-29 DIAGNOSIS — K56609 Unspecified intestinal obstruction, unspecified as to partial versus complete obstruction: Secondary | ICD-10-CM | POA: Diagnosis present

## 2014-08-29 DIAGNOSIS — D638 Anemia in other chronic diseases classified elsewhere: Secondary | ICD-10-CM | POA: Diagnosis present

## 2014-08-29 DIAGNOSIS — K5669 Other intestinal obstruction: Secondary | ICD-10-CM

## 2014-08-29 DIAGNOSIS — C7801 Secondary malignant neoplasm of right lung: Secondary | ICD-10-CM

## 2014-08-29 DIAGNOSIS — C180 Malignant neoplasm of cecum: Secondary | ICD-10-CM

## 2014-08-29 DIAGNOSIS — C7800 Secondary malignant neoplasm of unspecified lung: Secondary | ICD-10-CM | POA: Diagnosis present

## 2014-08-29 DIAGNOSIS — C786 Secondary malignant neoplasm of retroperitoneum and peritoneum: Secondary | ICD-10-CM

## 2014-08-29 DIAGNOSIS — C7802 Secondary malignant neoplasm of left lung: Secondary | ICD-10-CM

## 2014-08-29 DIAGNOSIS — C801 Malignant (primary) neoplasm, unspecified: Secondary | ICD-10-CM

## 2014-08-29 LAB — CBC
HCT: 31 % — ABNORMAL LOW (ref 36.0–46.0)
HEMOGLOBIN: 10.6 g/dL — AB (ref 12.0–15.0)
MCH: 29.4 pg (ref 26.0–34.0)
MCHC: 34.2 g/dL (ref 30.0–36.0)
MCV: 85.9 fL (ref 78.0–100.0)
Platelets: 407 10*3/uL — ABNORMAL HIGH (ref 150–400)
RBC: 3.61 MIL/uL — ABNORMAL LOW (ref 3.87–5.11)
RDW: 14.7 % (ref 11.5–15.5)
WBC: 6.7 10*3/uL (ref 4.0–10.5)

## 2014-08-29 LAB — COMPREHENSIVE METABOLIC PANEL
ALK PHOS: 56 U/L (ref 39–117)
ALT: 12 U/L (ref 0–35)
AST: 20 U/L (ref 0–37)
Albumin: 2.7 g/dL — ABNORMAL LOW (ref 3.5–5.2)
Anion gap: 13 (ref 5–15)
BILIRUBIN TOTAL: 0.2 mg/dL — AB (ref 0.3–1.2)
BUN: 4 mg/dL — ABNORMAL LOW (ref 6–23)
CO2: 24 meq/L (ref 19–32)
Calcium: 9 mg/dL (ref 8.4–10.5)
Chloride: 102 mEq/L (ref 96–112)
Creatinine, Ser: 0.78 mg/dL (ref 0.50–1.10)
GFR calc non Af Amer: >90 mL/min (ref >90)
GLUCOSE: 106 mg/dL — AB (ref 70–99)
POTASSIUM: 3.8 meq/L (ref 3.7–5.3)
Sodium: 139 mEq/L (ref 137–147)
TOTAL PROTEIN: 5.7 g/dL — AB (ref 6.0–8.3)

## 2014-08-29 LAB — APTT: aPTT: 26 seconds (ref 24–37)

## 2014-08-29 MED ORDER — HYDROMORPHONE HCL 1 MG/ML IJ SOLN
0.5000 mg | INTRAMUSCULAR | Status: DC | PRN
  Administered 2014-08-31 (×2): 0.5 mg via INTRAVENOUS
  Filled 2014-08-29 (×2): qty 1

## 2014-08-29 NOTE — Progress Notes (Signed)
Patient ID: Martha Becker, female   DOB: 13-Jan-1956, 58 y.o.   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 TOLERATING POs. PASSING GAS AND STOOL.  PLAN:  1. ANSWERED QUESTIONS REGARDING CURRENT PLAN. 2. R HEMICOLECTOMY  SCHEDULED FOR MON 3. CHECK CEA TODAY   Subjective: PT ADMITTED TOLERATING POS. MILD RLQ PAIN. BM: TODAY. NO NAUSEA OR VOMITING.  Objective: Vital signs in last 24 hours: Filed Vitals:   08/29/14 0633  BP: 111/74  Pulse: 60  Temp: 98 F (36.7 C)  Resp: 20   General appearance: alert, cooperative and no distress Resp: clear to auscultation bilaterally Cardio: regular rate and rhythm GI: soft, MILD RLQ TTP, NO REBOUND OR GUARDING, MILD DISTENTION, bowel sounds normal;   Lab Results: REVIEWED  Studies/Results: Dg Chest 2 View  08/28/2014   CLINICAL DATA:  Small bowel obstruction, cecal wall thickening question neoplasm, preoperative evaluation  EXAM: CHEST  2 VIEW  COMPARISON:  None  FINDINGS: Normal heart size, mediastinal contours, and pulmonary vascularity.  Emphysematous and bronchitic changes consistent with COPD.  Small BILATERAL pulmonary nodules identified concerning for pulmonary metastases.  These measure 8 mm diameter inferior RIGHT chest medially and 8 mm diameter at lateral lower LEFT lung.  Suspect additional small nodules 7 mm diameter at inferior LEFT lung adjacent to nipple shadow.  No acute infiltrate, pleural effusion, or pneumothorax.  Osseous structures unremarkable.  IMPRESSION: Bilateral pulmonary nodules worrisome for pulmonary metastases.  Recommend further evaluation by CT chest with contrast for further evaluation.   Electronically Signed   By: [name] M.D.   On: 08/28/2014 16:30    Medications: I have reviewed the patient's current medications.   LOS: 5 days   Barney Drain 02/19/2014, 2:23 PM

## 2014-08-29 NOTE — Consult Note (Signed)
Reason for Consult: Bowel obstruction secondary to colonic mass Referring Physician: Dr. Barney Drain  Martha Becker is an 58 y.o. female.  HPI: Patient is a 58 year old white female who in September of this year was found to have an inflammatory mass in the cecum. This was initially treated with antibiotics. She underwent a follow-up CT scan several days ago which revealed a bowel obstruction at the cecum with an enlarging inflammatory mass present. There are several liver lesions present. Multiple enlarged lymph nodes are noted in the abdomen. She is also had a CT scan of the chest which reveals multiple pulmonary nodules, consistent with metastatic disease.  History reviewed. No pertinent past medical history.  Past Surgical History  Procedure Laterality Date  . Cesarean section      Family History  Problem Relation Age of Onset  . Colon cancer Cousin     age late 58s  . Inflammatory bowel disease Neg Hx     Social History:  reports that she has never smoked. She does not have any smokeless tobacco history on file. She reports that she drinks alcohol. She reports that she does not use illicit drugs.  Allergies:  Allergies  Allergen Reactions  . Codeine Nausea And Vomiting    Medications: I have reviewed the patient's current medications.  Results for orders placed or performed during the hospital encounter of 08/28/14 (from the past 48 hour(s))  Comprehensive metabolic panel     Status: Abnormal   Collection Time: 08/28/14  3:49 PM  Result Value Ref Range   Sodium 132 (L) 137 - 147 mEq/L   Potassium 4.0 3.7 - 5.3 mEq/L   Chloride 93 (L) 96 - 112 mEq/L   CO2 25 19 - 32 mEq/L   Glucose, Bld 107 (H) 70 - 99 mg/dL   BUN 5 (L) 6 - 23 mg/dL   Creatinine, Ser 0.75 0.50 - 1.10 mg/dL   Calcium 9.2 8.4 - 10.5 mg/dL   Total Protein 6.6 6.0 - 8.3 g/dL   Albumin 3.2 (L) 3.5 - 5.2 g/dL   AST 20 0 - 37 U/L   ALT 12 0 - 35 U/L   Alkaline Phosphatase 63 39 - 117 U/L   Total Bilirubin  0.2 (L) 0.3 - 1.2 mg/dL   GFR calc non Af Amer >90 >90 mL/min   GFR calc Af Amer >90 >90 mL/min    Comment: (NOTE) The eGFR has been calculated using the CKD EPI equation. This calculation has not been validated in all clinical situations. eGFR's persistently <90 mL/min signify possible Chronic Kidney Disease.    Anion gap 14 5 - 15  CBC     Status: Abnormal   Collection Time: 08/28/14  3:49 PM  Result Value Ref Range   WBC 9.3 4.0 - 10.5 K/uL   RBC 4.01 3.87 - 5.11 MIL/uL   Hemoglobin 11.7 (L) 12.0 - 15.0 g/dL   HCT 34.3 (L) 36.0 - 46.0 %   MCV 85.5 78.0 - 100.0 fL   MCH 29.2 26.0 - 34.0 pg   MCHC 34.1 30.0 - 36.0 g/dL   RDW 14.4 11.5 - 15.5 %   Platelets 459 (H) 150 - 400 K/uL  Protime-INR     Status: None   Collection Time: 08/28/14  3:49 PM  Result Value Ref Range   Prothrombin Time 14.1 11.6 - 15.2 seconds   INR 1.08 0.00 - 1.49  Comprehensive metabolic panel     Status: Abnormal   Collection Time: 08/29/14  6:13  AM  Result Value Ref Range   Sodium 139 137 - 147 mEq/L    Comment: DELTA CHECK NOTED   Potassium 3.8 3.7 - 5.3 mEq/L   Chloride 102 96 - 112 mEq/L   CO2 24 19 - 32 mEq/L   Glucose, Bld 106 (H) 70 - 99 mg/dL   BUN 4 (L) 6 - 23 mg/dL   Creatinine, Ser 0.78 0.50 - 1.10 mg/dL   Calcium 9.0 8.4 - 10.5 mg/dL   Total Protein 5.7 (L) 6.0 - 8.3 g/dL   Albumin 2.7 (L) 3.5 - 5.2 g/dL   AST 20 0 - 37 U/L   ALT 12 0 - 35 U/L   Alkaline Phosphatase 56 39 - 117 U/L   Total Bilirubin 0.2 (L) 0.3 - 1.2 mg/dL   GFR calc non Af Amer >90 >90 mL/min   GFR calc Af Amer >90 >90 mL/min    Comment: (NOTE) The eGFR has been calculated using the CKD EPI equation. This calculation has not been validated in all clinical situations. eGFR's persistently <90 mL/min signify possible Chronic Kidney Disease.    Anion gap 13 5 - 15  CBC     Status: Abnormal   Collection Time: 08/29/14  6:13 AM  Result Value Ref Range   WBC 6.7 4.0 - 10.5 K/uL   RBC 3.61 (L) 3.87 - 5.11 MIL/uL    Hemoglobin 10.6 (L) 12.0 - 15.0 g/dL   HCT 31.0 (L) 36.0 - 46.0 %   MCV 85.9 78.0 - 100.0 fL   MCH 29.4 26.0 - 34.0 pg   MCHC 34.2 30.0 - 36.0 g/dL   RDW 14.7 11.5 - 15.5 %   Platelets 407 (H) 150 - 400 K/uL  APTT     Status: None   Collection Time: 08/29/14  6:13 AM  Result Value Ref Range   aPTT 26 24 - 37 seconds    Dg Chest 2 View  08/28/2014   CLINICAL DATA:  Small bowel obstruction, cecal wall thickening question neoplasm, preoperative evaluation  EXAM: CHEST  2 VIEW  COMPARISON:  None  FINDINGS: Normal heart size, mediastinal contours, and pulmonary vascularity.  Emphysematous and bronchitic changes consistent with COPD.  Small BILATERAL pulmonary nodules identified concerning for pulmonary metastases.  These measure 8 mm diameter inferior RIGHT chest medially and 8 mm diameter at lateral lower LEFT lung.  Suspect additional small nodules 7 mm diameter at inferior LEFT lung adjacent to nipple shadow.  No acute infiltrate, pleural effusion, or pneumothorax.  Osseous structures unremarkable.  IMPRESSION: Bilateral pulmonary nodules worrisome for pulmonary metastases.  Recommend further evaluation by CT chest with contrast for further evaluation.   Electronically Signed   By: [name] M.D.   On: 08/28/2014 16:30   Ct Chest Wo Contrast  08/29/2014   CLINICAL DATA:  Pt. Has cecal mass seen on CT abdomen. Lung nodules seen on CT. History of SBO.  EXAM: CT CHEST WITHOUT CONTRAST  TECHNIQUE: Multidetector CT imaging of the chest was performed following the standard protocol without IV contrast.  COMPARISON:  Chest radiograph, 08/28/2014 showing evidence of bilateral pulmonary nodules.  FINDINGS: There are multiple bilateral pulmonary nodules. On the right, the largest Hise peripherally in the right lower lobe, image 42, series 3, measuring 8.4 mm. On the left the largest lies in the left lower lobe at the posterior diaphragmatic lung base, image 54, series 3, measuring 10.4 mm. Nodules are seen  and all lobes. Nodules predominate in the lower lobes.  Small  right pleural effusion. There is no lung consolidation to suggest pneumonia. No pulmonary edema. Minor apical parenchymal scarring is noted.  Heart is normal in size and configuration. No neck base, axillary, mediastinal or hilar masses or adenopathy.  There are low-density liver lesions in the visualized upper abdomen, better evaluated on the previous day's contrast enhanced abdomen and pelvis CT.  No osteoblastic or osteolytic lesions.  IMPRESSION: 1. Multiple pulmonary nodules consistent with metastatic disease. 2. No acute findings. No other evidence of metastatic disease to the chest. 3. Small right pleural effusion.   Electronically Signed   By: [name] M.D.   On: 08/29/2014 08:45   Ct Abdomen Pelvis W Contrast  08/27/2014   CLINICAL DATA:  Inflamed area in the RIGHT side of the colon. On antibiotics for 3 months. Bloating for 1 week. RIGHT lower quadrant pain. Abnormal CT scan.  EXAM: EXAM CT ABDOMEN AND PELVIS WITH CONTRAST  TECHNIQUE: Multidetector CT imaging of the abdomen and pelvis was performed using the standard protocol following bolus administration of intravenous contrast.  CONTRAST:  143m OMNIPAQUE IOHEXOL 300 MG/ML  SOLN  COMPARISON:  06/03/2014.  FINDINGS: Musculoskeletal: Thoracolumbar vertebral bodies appear within normal limits. Visible ribs appear normal. No osseous metastatic disease is identified.  Lung Bases: New pulmonary nodules are present at the lung bases, compatible with hematogenous metastatic disease. The largest is in the LEFT lower lobe and measures 11 mm. This demonstrates low attenuation centrally suggesting mucinous neoplasm.  Liver: Multiple hepatic cystic lesions appears similar to the prior exam. Fatty infiltration at the falciform ligament of the liver. No evidence of hepatic metastatic disease by CT.  Spleen:  Normal.  Gallbladder:  Partially contracted.  No calcified stones.  Common bile duct:   Normal.  Pancreas:  Normal.  Adrenal glands:  Normal bilaterally.  Kidneys: Normal enhancement and delayed excretion of contrast. No ureteral obstruction. There is LEFT retroperitoneal adenopathy with a conglomeration of LEFT retroperitoneal  Renal and periaortic lymph nodes just deep to the LEFT renal vein measuring 24 mm x 39 mm (image 25 series 2). Other smaller LEFT retroperitoneal lymph nodes are present.  Stomach: Mild thickening of the antrum of the stomach is present, probably due to underdistention. Attention on follow-up is recommended. No gastric outlet obstruction.  Small bowel: Small bowel obstruction is present with fecalization of distal small bowel contents. This becomes progressively worse approaching the ileocecal valve which appears to be the point of obstruction.  Colon: Cecal mass is present. This is at the ileocecal valve. Enhancing mural mass measures up to 3.3 cm in thickness. The appendix extends off the inferior aspect of the cecum in demonstrates mild mucosal hyper enhancement although the appearance is unlikely to represent acute appendicitis. On the coronal images, there is extension of tumor from the cecum to the proximal transverse colon. Small amount of fluid is present around the ascending colon, probably representing malignant ascites. 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  Pelvic Genitourinary: Uterus and urinary bladder appear within normal limits.  Peritoneum: Stranding of the small bowel mesenteric and peritoneal surfaces present, most prominent in the RIGHT lower quadrant. This is most compatible with peritoneal carcinomatosis in this patient with a likely advanced neoplasm of the cecum.  Vasculature: Retroaortic LEFT renal vein. No acute vascular abnormality.  Body Wall: Tiny fat containing periumbilical hernia.  IMPRESSION: 1. Progression of cecal mass, compatible with colon cancer.  There is extension of tumor from the cecum to the proximal transverse colon which is now adherent to the cecal mass. Calcified ileocecal lymph node is present and there is now LEFT (contralateral) retroperitoneal spread of disease. 2. New high grade partial small bowel obstruction associated with the cecal mass. Surgical diversion should be considered based on the small bowel obstruction. 3. RIGHT lower quadrant peritoneal fat stranding consistent with peritoneal carcinomatosis. 4. New pulmonary nodules compatible with hematogenous metastases. 5. The appendix is fluid-filled and mildly dilated and enhancing, secondary to obstruction of the appendiceal orifice at the cecal mass. Primary acute appendicitis is unlikely and chronic antibiotics may have suppressed acute appendicitis associated with appendiceal obstruction. 6. These results will be called to the ordering clinician or representative by the Radiologist Assistant, and communication documented in the PACS or zVision Dashboard.   Electronically Signed   By: Dereck Ligas M.D.   On: 08/27/2014 17:40    ROS: See chart Blood pressure 111/74, pulse 60, temperature 98 F (36.7 C), temperature source Oral, resp. rate 20, height 5' 6"  (1.676 m), weight 65.545 kg (144 lb 8 oz), SpO2 100 %. Physical Exam: Pleasant white female in no acute distress. Abdomen is soft with some fullness in the right side of the abdomen. No significant distention noted. No hepatosplenomegaly, rigidity, or hernias are noted.  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 We will start bowel prep tomorrow for right hemicolectomy on Monday.  Martha Becker A 08/29/2014, 11:06 AM

## 2014-08-29 NOTE — Discharge Instructions (Addendum)
Metastatic Cancer, Questions and Answers KEY POINTS  Cancer happens when cells become abnormal and grow without control.  Where the cancer started is called the primary cancer or the primary tumor.  Metastatic cancer happens when cancer cells spread from the place where it started to other parts of the body.  When cancer spreads, the metastatic cancer keeps the same type of cells and the same name as the primary tumor.  The most common sites of metastasis are the lungs, bones, liver, and brain.  Treatment for metastatic cancer usually depends on the type of cancer. It also depends on the size and location of the metastasis. WHAT IS CANCER?   Cancer is a group of many related diseases. All cancers begin in cells. Cells are the building blocks that make up tissues. Cancer that arises from organs and solid tissues is called a solid tumor. Cancer that begins in blood cells is called leukemia, multiple myeloma, or lymphoma.  Normally, cells grow and divide to form new cells as the body needs them. When cells grow old and die, new cells take their place. Sometimes this orderly process goes wrong. New cells form when the body does not need them. Old cells do not die when they should.  The extra cells form a mass of tissue. This is called a growth or tumor. Tumors can be either not cancerous (benign) or cancerous (malignant). Benign tumors do not spread to other parts of the body. They are rarely a threat to life. Malignant tumors can spread (metastasize) and may be life threatening. WHAT IS PRIMARY CANCER?  Cancer can begin in any organ or tissue of the body. The original tumor is called the primary cancer or primary tumor. It is usually named for the part of the body or the type of cell in which it begins. WHAT IS METASTASIS, AND HOW DOES IT HAPPEN?   Metastasis means the spread of cancer. Cancer cells can break away from a primary tumor and enter the bloodstream or lymphatic system. This is the  system that produces, stores, and carries the cells that fight infections. That is how cancer cells spread to other parts of the body.  When cancer cells spread and form a new tumor in a different organ, the new tumor is a metastatic tumor. The cells in the metastatic tumor come from the original tumor. For example, if breast cancer spreads to the lungs, the metastatic tumor in the lung is made up of cancerous breast cells. It is not made of lung cells. In this case, the disease in the lungs is metastatic breast cancer (not lung cancer). Under a microscope, metastatic breast cancer cells generally look the same as the cancer cells in the breast. [place]?   Cancer cells can spread to almost any part of the body. Cancer cells frequently spread to lymph nodes (rounded masses of lymphatic tissue) near the primary tumor (regional lymph nodes). This is called lymph node involvement or regional disease. Cancer that spreads to other organs or to lymph nodes far from the primary tumor is called metastatic disease. Caregivers sometimes also call this distant disease.  The most common sites of metastasis from solid tumors are the lungs, bones, liver, and brain. Some cancers tend to spread to certain parts of the body. For example, lung cancer often metastasizes to the brain or bones. Colon cancer often spreads to the liver. Prostate cancer tends to spread to the bones. Breast cancer commonly spreads to the bones, lungs, liver,  the liver. Prostate cancer tends to spread to the bones. Breast cancer commonly spreads to the bones, lungs, liver, or brain. But each of these cancers can spread to other parts of the body as well.  · Because blood cells travel throughout the body, leukemia, multiple myeloma, and lymphoma cells are usually not localized when the cancer is diagnosed. Tumor cells may be found in the blood, several lymph nodes, or other parts of the body such as the liver or bones. This type of spread is not referred to as metastasis.  ARE THERE SYMPTOMS OF METASTATIC CANCER?   · Some people with metastatic  cancer do not have symptoms. Their metastases are found by X-rays and other tests performed for other reasons.  · When symptoms of metastatic cancer occur, the type and frequency of the symptoms will depend on the size and location of the metastasis. For example, cancer that spreads to the bones is likely to cause pain and can lead to bone fractures. Cancer that spreads to the brain can cause a variety of symptoms. These include headaches, seizures, and unsteadiness. Shortness of breath may be a sign of lung involvement. Abdominal swelling or yellowing of the skin (jaundice) can indicate that cancer has spread to the liver.  · Sometimes a person's primary cancer is discovered only after the metastatic tumor causes symptoms. For example, a man whose prostate cancer has spread to the bones in his pelvis may have lower back pain (caused by the cancer in his bones) before he experiences any symptoms from the primary tumor in his prostate.  HOW DOES THE CAREGIVER KNOW WHETHER A CANCER IS PRIMARY OR A METASTATIC TUMOR?  · To determine whether a tumor is primary or metastatic, the tumor will be examined under a microscope. In general, cancer cells look like abnormal versions of cells in the tissue where the cancer began. Using specialized diagnostic tests, a trained person is often able to tell where the cancer cells came from. Markers or antigens found in or on the cancer cells can indicate the primary site of the cancer.  · Metastatic cancers may be found before or at the same time as the primary tumor, or months or years later. When a new tumor is found in a patient who has been treated for cancer in the past, it is more often a metastasis than another primary tumor.  IS IT POSSIBLE TO HAVE A METASTATIC TUMOR WITHOUT HAVING A PRIMARY CANCER?   No. A metastatic tumor always starts from cancer cells in another part of the body. In most cases, when a metastatic tumor is found first, the primary tumor can be found. The  search for the primary tumor may involve lab tests, X-rays, and other procedures. However, in a small number of cases, a metastatic tumor is diagnosed but the primary tumor cannot be found, in spite of extensive tests. The tumor is metastatic because the cells are not like those in the organ or tissue in which the tumor is found. The primary tumor is called unknown or hidden (occult). The patient is said to have cancer of unknown primary origin (CUP). Because diagnostic techniques are constantly improving, the number of cases of CUP is going down.   WHAT TREATMENTS ARE USED FOR METASTATIC CANCER?   · When cancer has metastasized, it may be treated with:  ¨ Chemotherapy.  ¨ Radiation therapy.  ¨ Biological therapy.  ¨ Hormone therapy.  ¨ Surgery.  ¨ Cryosurgery.  ¨ A combination of these.  ·   patients with CUP, it is possible to treat the disease even though the primary tumor has not been located. The goal of treatment may be to control the cancer, or to relieve symptoms or side effects of treatment. ARE NEW TREATMENTS FOR METASTATIC CANCER BEING DEVELOPED?  Yes, many new cancer treatments are under study. To develop new treatments, the [facility] sponsors clinical trials (research studies) with cancer patients in many hospitals, universities, medical schools, and cancer centers around the country. Clinical trials are a critical step in the improvement of treatment. Before any new treatment can be recommended for general use, doctors conduct studies to find out whether the treatment is both safe for patients and effective against the disease. The results of such studies have led to progress not only in the treatment of cancer, but in the detection, diagnosis, and prevention of the disease as well. Patients  interested in taking part in a clinical trial should talk with their caregivers. [place] www.cancer.gov Document Released: 01/02/2005 Document Revised: 11/20/2011 Document Reviewed: 08/20/2008 [facility] Patient Information 2015 [place], [place]. This information is not intended to replace advice given to you by your health care provider. Make sure you discuss any questions you have with your health care provider. Open Colectomy, Care After Refer to this sheet in the next few weeks. These instructions provide you with information on caring for yourself after your procedure. Your health care provider may also give you more specific instructions. Your treatment has been planned according to current medical practices, but problems sometimes occur. Call your health care provider if you have any problems or questions after your procedure. WHAT TO EXPECT AFTER THE PROCEDURE After your procedure, it is typical to have the following:  Pain in your abdomen, especially along your incision. You will be given medicines to control the pain.  Tiredness. This is a normal part of the recovery process. Your energy level will return to normal over the next several weeks.  Constipation. You may be given a stool softener to prevent this. HOME CARE INSTRUCTIONS  Only take over-the-counter or prescription medicines as directed by your health care provider.  Ask your health care provider whether you may take a shower when you go home.  You may resume a normal diet and activities as directed. Eat plenty of fruits and vegetables to help prevent constipation.  Drink enough fluids to keep your urine clear or pale yellow. This also helps prevent constipation.  Take rest breaks during the day as needed.  Avoid lifting anything heavier than 25 pounds (11.3 kg) or driving for 4 weeks or until your health care provider says it is okay.  Follow up with your health care provider  as directed. Ask your health care provider when you need to return to have your stitches or staples removed. SEEK MEDICAL CARE IF:  You have redness, swelling, or increasing pain in the incision area.  You see pus coming from the incision area.  You have a fever. SEEK IMMEDIATE MEDICAL CARE IF:   You have chest pain or shortness of breath.  You have pain or swelling in your legs.  You have persistent nausea and vomiting.  Your wound breaks open after stitches or staples have been removed.  You have increasing abdominal pain that is not relieved with medicine. Document Released: 03/21/2011 Document Revised: 06/18/2013 Document Reviewed: 04/09/2013 [facility] Patient Information 2015 [place], [place]. This information is not intended to replace advice given to you by your health care provider.  Make sure you discuss any questions you have with your health care provider. ° °

## 2014-08-29 NOTE — Progress Notes (Signed)
[facility] PROGRESS NOTE  Martha Becker MOL:[id number] DOB: [birth year] DOA: 08/28/2014 PCP: [name], MD    Code Status: Full code Family Communication: Discussed with patient; family not available. Disposition Plan: Discharge when clinically appropriate.   Consultants:  Gen. surgery, Dr. Arnoldo Morale, pending.  (Referring physician gastroenterologist, Dr. Oneida Alar)  Curbside consult oncology, full consult pending biopsy diagnosis.  Procedures:  None  Antibiotics:  None  HPI/Subjective: Patient has mild intermittent right lower quadrant abdominal pain that eases with pain medication. She denies nausea vomiting this morning. She is tolerating her clear liquid diet.  Objective: Filed Vitals:   08/29/14 0633  BP: 111/74  Pulse: 60  Temp: 98 F (36.7 C)  Resp: 20   oxygen saturation 100%.  Intake/Output Summary (Last 24 hours) at 08/29/14 0851 Last data filed at 08/29/14 2010  Gross per 24 hour  Intake      0 ml  Output   1450 ml  Net  -1450 ml   Filed Weights   08/28/14 1338  Weight: 65.545 kg (144 lb 8 oz)    Exam:   General:  Pleasant alert 58 year old sitting up in bed, in no acute distress.  Cardiovascular: S1, S2, with no murmurs rubs or gallops.  Respiratory: Clear to auscultation bilaterally.  Abdomen: Positive bowel sounds, soft, mildly tender right lower quadrant; minimal distention; no masses palpated.  Musculoskeletal: No acute hot red joints. Pedal pulses palpable. No pedal edema.  Neurologic: She is alert and oriented 3. Cranial nerves II through XII are intact.   Data Reviewed: Basic Metabolic Panel:  Recent Labs Lab 08/28/14 1549 08/29/14 0613  NA 132* 139  K 4.0 3.8  CL 93* 102  CO2 25 24  GLUCOSE 107* 106*  BUN 5* 4*  CREATININE 0.75 0.78  CALCIUM 9.2 9.0   Liver Function Tests:  Recent Labs Lab 08/28/14 1549 08/29/14 0613  AST 20 20  ALT 12 12  ALKPHOS 63 56  BILITOT 0.2* 0.2*  PROT 6.6 5.7*  ALBUMIN  3.2* 2.7*   No results for input(s): LIPASE, AMYLASE in the last 168 hours. No results for input(s): AMMONIA in the last 168 hours. CBC:  Recent Labs Lab 08/28/14 1549 08/29/14 0613  WBC 9.3 6.7  HGB 11.7* 10.6*  HCT 34.3* 31.0*  MCV 85.5 85.9  PLT 459* 407*   Cardiac Enzymes: No results for input(s): CKTOTAL, CKMB, CKMBINDEX, TROPONINI in the last 168 hours. BNP (last 3 results) No results for input(s): PROBNP in the last 8760 hours. CBG: No results for input(s): GLUCAP in the last 168 hours.  No results found for this or any previous visit (from the past 240 hour(s)).   Studies: Dg Chest 2 View  08/28/2014   CLINICAL DATA:  Small bowel obstruction, cecal wall thickening question neoplasm, preoperative evaluation  EXAM: CHEST  2 VIEW  COMPARISON:  None  FINDINGS: Normal heart size, mediastinal contours, and pulmonary vascularity.  Emphysematous and bronchitic changes consistent with COPD.  Small BILATERAL pulmonary nodules identified concerning for pulmonary metastases.  These measure 8 mm diameter inferior RIGHT chest medially and 8 mm diameter at lateral lower LEFT lung.  Suspect additional small nodules 7 mm diameter at inferior LEFT lung adjacent to nipple shadow.  No acute infiltrate, pleural effusion, or pneumothorax.  Osseous structures unremarkable.  IMPRESSION: Bilateral pulmonary nodules worrisome for pulmonary metastases.  Recommend further evaluation by CT chest with contrast for further evaluation.   Electronically Signed   By: [name] M.D.   On: 08/28/2014  16:30   Ct Chest Wo Contrast  08/29/2014   CLINICAL DATA:  Pt. Has cecal mass seen on CT abdomen. Lung nodules seen on CT. History of SBO.  EXAM: CT CHEST WITHOUT CONTRAST  TECHNIQUE: Multidetector CT imaging of the chest was performed following the standard protocol without IV contrast.  COMPARISON:  Chest radiograph, 08/28/2014 showing evidence of bilateral pulmonary nodules.  FINDINGS: There are multiple  bilateral pulmonary nodules. On the right, the largest Hise peripherally in the right lower lobe, image 42, series 3, measuring 8.4 mm. On the left the largest lies in the left lower lobe at the posterior diaphragmatic lung base, image 54, series 3, measuring 10.4 mm. Nodules are seen and all lobes. Nodules predominate in the lower lobes.  Small right pleural effusion. There is no lung consolidation to suggest pneumonia. No pulmonary edema. Minor apical parenchymal scarring is noted.  Heart is normal in size and configuration. No neck base, axillary, mediastinal or hilar masses or adenopathy.  There are low-density liver lesions in the visualized upper abdomen, better evaluated on the previous day's contrast enhanced abdomen and pelvis CT.  No osteoblastic or osteolytic lesions.  IMPRESSION: 1. Multiple pulmonary nodules consistent with metastatic disease. 2. No acute findings. No other evidence of metastatic disease to the chest. 3. Small right pleural effusion.   Electronically Signed   By: [name] M.D.   On: 08/29/2014 08:45   Ct Abdomen Pelvis W Contrast  08/27/2014   CLINICAL DATA:  Inflamed area in the RIGHT side of the colon. On antibiotics for 3 months. Bloating for 1 week. RIGHT lower quadrant pain. Abnormal CT scan.  EXAM: EXAM CT ABDOMEN AND PELVIS WITH CONTRAST  TECHNIQUE: Multidetector CT imaging of the abdomen and pelvis was performed using the standard protocol following bolus administration of intravenous contrast.  CONTRAST:  153mL OMNIPAQUE IOHEXOL 300 MG/ML  SOLN  COMPARISON:  06/03/2014.  FINDINGS: Musculoskeletal: Thoracolumbar vertebral bodies appear within normal limits. Visible ribs appear normal. No osseous metastatic disease is identified.  Lung Bases: New pulmonary nodules are present at the lung bases, compatible with hematogenous metastatic disease. The largest is in the LEFT lower lobe and measures 11 mm. This demonstrates low attenuation centrally suggesting mucinous  neoplasm.  Liver: Multiple hepatic cystic lesions appears similar to the prior exam. Fatty infiltration at the falciform ligament of the liver. No evidence of hepatic metastatic disease by CT.  Spleen:  Normal.  Gallbladder:  Partially contracted.  No calcified stones.  Common bile duct:  Normal.  Pancreas:  Normal.  Adrenal glands:  Normal bilaterally.  Kidneys: Normal enhancement and delayed excretion of contrast. No ureteral obstruction. There is LEFT retroperitoneal adenopathy with a conglomeration of LEFT retroperitoneal  Renal and periaortic lymph nodes just deep to the LEFT renal vein measuring 24 mm x 39 mm (image 25 series 2). Other smaller LEFT retroperitoneal lymph nodes are present.  Stomach: Mild thickening of the antrum of the stomach is present, probably due to underdistention. Attention on follow-up is recommended. No gastric outlet obstruction.  Small bowel: Small bowel obstruction is present with fecalization of distal small bowel contents. This becomes progressively worse approaching the ileocecal valve which appears to be the point of obstruction.  Colon: Cecal mass is present. This is at the ileocecal valve. Enhancing mural mass measures up to 3.3 cm in thickness. The appendix extends off the inferior aspect of the cecum in demonstrates mild mucosal hyper enhancement although the appearance is unlikely to represent acute appendicitis. On  the coronal images, there is extension of tumor from the cecum to the proximal transverse colon. Small amount of fluid is present around the ascending colon, probably representing malignant ascites. 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  Pelvic Genitourinary: Uterus and urinary bladder appear within normal limits.  Peritoneum: Stranding of the small bowel mesenteric and peritoneal surfaces present, most prominent in the RIGHT lower quadrant. This is most compatible with  peritoneal carcinomatosis in this patient with a likely advanced neoplasm of the cecum.  Vasculature: Retroaortic LEFT renal vein. No acute vascular abnormality.  Body Wall: Tiny fat containing periumbilical hernia.  IMPRESSION: 1. Progression of cecal mass, compatible with colon cancer. There is extension of tumor from the cecum to the proximal transverse colon which is now adherent to the cecal mass. Calcified ileocecal lymph node is present and there is now LEFT (contralateral) retroperitoneal spread of disease. 2. New high grade partial small bowel obstruction associated with the cecal mass. Surgical diversion should be considered based on the small bowel obstruction. 3. RIGHT lower quadrant peritoneal fat stranding consistent with peritoneal carcinomatosis. 4. New pulmonary nodules compatible with hematogenous metastases. 5. The appendix is fluid-filled and mildly dilated and enhancing, secondary to obstruction of the appendiceal orifice at the cecal mass. Primary acute appendicitis is unlikely and chronic antibiotics may have suppressed acute appendicitis associated with appendiceal obstruction. 6. These results will be called to the ordering clinician or representative by the Radiologist Assistant, and communication documented in the PACS or zVision Dashboard.   Electronically Signed   By: Dereck Ligas M.D.   On: 08/27/2014 17:40    Scheduled Meds: . ciprofloxacin  400 mg Intravenous Q12H  . sodium chloride  3 mL Intravenous Q12H   Continuous Infusions:   Assessment and plan: Principal Problem:   Colonic obstruction Active Problems:   Pulmonary nodules   Lung metastasis   Cecal cancer   Peritoneal carcinomatosis   RLQ abdominal pain   Nausea with vomiting   Anemia of chronic disease    1. Cecal mass, consistent with cecal cancer, with associated peritoneal carcinomatosis liver metastasis. Oncology has deferred full consultation until tissue biopsy. Gastroenterologist, Dr. Oneida Alar who  referred patient for admission yesterday, has discussed the patient with Dr. Arnoldo Morale. His full consultation is pending. We'll add when necessary IV hydromorphone for pain. Continue Cipro for consideration of superimposed colonic infection; continued from outpatient management. Flagyl not restarted, ? because of nausea.  High-grade small bowel obstruction. Surprisingly, the patient has no significant nausea or vomiting. She had a bowel movement a couple days ago, nonbloody according to her. She has no significant distention on exam. Dr. Arnoldo Morale consultation pending for consideration of a hemicolectomy. Continue when necessary Zofran.  Pulmonary nodules, consistent with metastatic disease.  Anemia, likely of chronic disease-metastatic cancer. The patient does not endorse hematochezia or melena. We'll continue to hold her sulfate.    Time spent: 30 minutes.    [facility] Pager [phone]. If 7PM-7AM, please contact night-coverage at [email or web address], password [facility] 08/29/2014, 8:51 AM  LOS: 1 day

## 2014-08-30 LAB — BASIC METABOLIC PANEL
ANION GAP: 11 (ref 5–15)
BUN: 5 mg/dL — ABNORMAL LOW (ref 6–23)
CALCIUM: 9.3 mg/dL (ref 8.4–10.5)
CHLORIDE: 102 meq/L (ref 96–112)
CO2: 26 mEq/L (ref 19–32)
Creatinine, Ser: 0.86 mg/dL (ref 0.50–1.10)
GFR calc Af Amer: 86 mL/min — ABNORMAL LOW (ref >90)
GFR calc non Af Amer: 74 mL/min — ABNORMAL LOW (ref >90)
Glucose, Bld: 125 mg/dL — ABNORMAL HIGH (ref 70–99)
Potassium: 4.1 mEq/L (ref 3.7–5.3)
Sodium: 139 mEq/L (ref 137–147)

## 2014-08-30 LAB — CBC WITH DIFFERENTIAL/PLATELET
BASOS ABS: 0 10*3/uL (ref 0.0–0.1)
BASOS PCT: 0 % (ref 0–1)
Eosinophils Absolute: 0.4 10*3/uL (ref 0.0–0.7)
Eosinophils Relative: 5 % (ref 0–5)
HEMATOCRIT: 31.4 % — AB (ref 36.0–46.0)
Hemoglobin: 10.6 g/dL — ABNORMAL LOW (ref 12.0–15.0)
Lymphocytes Relative: 20 % (ref 12–46)
Lymphs Abs: 1.4 10*3/uL (ref 0.7–4.0)
MCH: 29.5 pg (ref 26.0–34.0)
MCHC: 33.8 g/dL (ref 30.0–36.0)
MCV: 87.5 fL (ref 78.0–100.0)
Monocytes Absolute: 0.7 10*3/uL (ref 0.1–1.0)
Monocytes Relative: 9 % (ref 3–12)
NEUTROS ABS: 4.8 10*3/uL (ref 1.7–7.7)
Neutrophils Relative %: 66 % (ref 43–77)
Platelets: 415 10*3/uL — ABNORMAL HIGH (ref 150–400)
RBC: 3.59 MIL/uL — ABNORMAL LOW (ref 3.87–5.11)
RDW: 14.9 % (ref 11.5–15.5)
WBC: 7.3 10*3/uL (ref 4.0–10.5)

## 2014-08-30 LAB — HEMOGLOBIN A1C
HEMOGLOBIN A1C: 5.5 % (ref <5.7)
MEAN PLASMA GLUCOSE: 111 mg/dL (ref <117)

## 2014-08-30 LAB — ABO/RH: ABO/RH(D): A POS

## 2014-08-30 LAB — CEA: CEA: 2.2 ng/mL (ref 0.0–5.0)

## 2014-08-30 LAB — SURGICAL PCR SCREEN
MRSA, PCR: NEGATIVE
STAPHYLOCOCCUS AUREUS: NEGATIVE

## 2014-08-30 LAB — PREPARE RBC (CROSSMATCH)

## 2014-08-30 MED ORDER — ALVIMOPAN 12 MG PO CAPS: 12.0000 mg | ORAL_CAPSULE | Freq: Once | ORAL | Status: DC

## 2014-08-30 MED ORDER — CHLORHEXIDINE GLUCONATE CLOTH 2 % EX PADS
6.0000 | MEDICATED_PAD | Freq: Once | CUTANEOUS | Status: AC
Start: 2014-08-30 — End: 2014-08-30
  Administered 2014-08-30: 6 via TOPICAL

## 2014-08-30 MED ORDER — DEXTROSE 5 % IV SOLN: 2.0000 g | INTRAVENOUS | Status: DC

## 2014-08-30 MED ORDER — SODIUM CHLORIDE 0.9 % IV SOLN: 250.0000 mL | INTRAVENOUS | Status: DC | PRN

## 2014-08-30 MED ORDER — NEOMYCIN SULFATE 500 MG PO TABS
1000.0000 mg | ORAL_TABLET | ORAL | Status: AC
  Administered 2014-08-30 – 2014-08-31 (×3): 1000 mg via ORAL
  Filled 2014-08-30 (×3): qty 2

## 2014-08-30 MED ORDER — ENOXAPARIN SODIUM 40 MG/0.4ML ~~LOC~~ SOLN
40.0000 mg | Freq: Every day | SUBCUTANEOUS | Status: DC
  Administered 2014-08-30 – 2014-09-04 (×6): 40 mg via SUBCUTANEOUS
  Filled 2014-08-30 (×6): qty 0.4

## 2014-08-30 MED ORDER — ERYTHROMYCIN BASE 250 MG PO TABS
1000.0000 mg | ORAL_TABLET | ORAL | Status: AC
  Administered 2014-08-30 – 2014-08-31 (×3): 1000 mg via ORAL
  Filled 2014-08-30 (×3): qty 4

## 2014-08-30 NOTE — Progress Notes (Signed)
Patient received bowel prep today for right hemicolectomy tomorrow. The risks and benefits of the procedure including bleeding, infection, cardiopulmonary difficulties, and the possibility of a blood transfusion were fully explained to the patient, who gave informed consent. All questions were answered. She is aware of all the test results. CEA is pending. I have told her that I am concerned for metastatic colon cancer. I have transferred care to my service.

## 2014-08-31 ENCOUNTER — Inpatient Hospital Stay (HOSPITAL_COMMUNITY): Payer: BC Managed Care – PPO | Admitting: Anesthesiology

## 2014-08-31 ENCOUNTER — Encounter (HOSPITAL_COMMUNITY): Payer: Self-pay | Admitting: *Deleted

## 2014-08-31 ENCOUNTER — Encounter (HOSPITAL_COMMUNITY)
Admission: AD | Disposition: A | Discharge status: Home / Self Care | Payer: Self-pay | Source: Ambulatory Visit | Attending: General Surgery

## 2014-08-31 ENCOUNTER — Encounter (HOSPITAL_COMMUNITY): Admission: RE | Payer: Self-pay | Source: Ambulatory Visit

## 2014-08-31 ENCOUNTER — Ambulatory Visit (HOSPITAL_COMMUNITY)
Admission: RE | Admit: 2014-08-31 | Payer: BC Managed Care – PPO | Source: Ambulatory Visit | Admitting: Gastroenterology

## 2014-08-31 HISTORY — PX: PARTIAL COLECTOMY: SHX5273

## 2014-08-31 HISTORY — PX: LIVER BIOPSY: SHX301

## 2014-08-31 SURGERY — COLECTOMY, PARTIAL
Anesthesia: General | Site: Abdomen

## 2014-08-31 SURGERY — COLONOSCOPY
Anesthesia: Moderate Sedation

## 2014-08-31 MED ORDER — LORAZEPAM 2 MG/ML IJ SOLN
0.5000 mg | Freq: Once | INTRAMUSCULAR | Status: AC
  Administered 2014-08-31: 0.5 mg via INTRAVENOUS

## 2014-08-31 MED ORDER — ALVIMOPAN 12 MG PO CAPS
12.0000 mg | ORAL_CAPSULE | Freq: Two times a day (BID) | ORAL | Status: DC
  Administered 2014-09-01 – 2014-09-04 (×7): 12 mg via ORAL
  Filled 2014-08-31 (×7): qty 1

## 2014-08-31 MED ORDER — BUPIVACAINE LIPOSOME 1.3 % IJ SUSP
INTRAMUSCULAR | Status: DC | PRN
  Administered 2014-08-31: 15 mL

## 2014-08-31 MED ORDER — MIDAZOLAM HCL 2 MG/2ML IJ SOLN
1.0000 mg | INTRAMUSCULAR | Status: DC | PRN
  Administered 2014-08-31: 2 mg via INTRAVENOUS

## 2014-08-31 MED ORDER — ALVIMOPAN 12 MG PO CAPS
12.0000 mg | ORAL_CAPSULE | Freq: Once | ORAL | Status: AC
  Administered 2014-08-31: 12 mg via ORAL

## 2014-08-31 MED ORDER — KETOROLAC TROMETHAMINE 30 MG/ML IJ SOLN
INTRAMUSCULAR | Status: AC
  Filled 2014-08-31: qty 1

## 2014-08-31 MED ORDER — FENTANYL CITRATE 0.05 MG/ML IJ SOLN
INTRAMUSCULAR | Status: DC | PRN
  Administered 2014-08-31 (×2): 50 ug via INTRAVENOUS
  Administered 2014-08-31: 25 ug via INTRAVENOUS
  Administered 2014-08-31: 50 ug via INTRAVENOUS
  Administered 2014-08-31: 25 ug via INTRAVENOUS
  Administered 2014-08-31: 50 ug via INTRAVENOUS

## 2014-08-31 MED ORDER — LACTATED RINGERS IV SOLN
INTRAVENOUS | Status: DC
  Administered 2014-09-01 – 2014-09-02 (×2): via INTRAVENOUS

## 2014-08-31 MED ORDER — ACETAMINOPHEN 325 MG PO TABS: 650.0000 mg | ORAL_TABLET | Freq: Four times a day (QID) | ORAL | Status: DC | PRN

## 2014-08-31 MED ORDER — POVIDONE-IODINE 10 % EX OINT
TOPICAL_OINTMENT | CUTANEOUS | Status: AC
  Filled 2014-08-31: qty 2

## 2014-08-31 MED ORDER — GLYCOPYRROLATE 0.2 MG/ML IJ SOLN
INTRAMUSCULAR | Status: DC | PRN
  Administered 2014-08-31: 0.4 mg via INTRAVENOUS

## 2014-08-31 MED ORDER — LACTATED RINGERS IV SOLN
INTRAVENOUS | Status: DC
  Administered 2014-08-31: 15:00:00 via INTRAVENOUS
  Administered 2014-08-31: 1000 mL via INTRAVENOUS

## 2014-08-31 MED ORDER — PROPOFOL 10 MG/ML IV BOLUS
INTRAVENOUS | Status: DC | PRN
  Administered 2014-08-31: 30 mg via INTRAVENOUS
  Administered 2014-08-31: 150 mg via INTRAVENOUS
  Administered 2014-08-31: 20 mg via INTRAVENOUS

## 2014-08-31 MED ORDER — 0.9 % SODIUM CHLORIDE (POUR BTL) OPTIME
TOPICAL | Status: DC | PRN
  Administered 2014-08-31: 1000 mL

## 2014-08-31 MED ORDER — DEXTROSE 5 % IV SOLN
2.0000 g | INTRAVENOUS | Status: AC
  Administered 2014-08-31: 2 g via INTRAVENOUS
  Filled 2014-08-31: qty 2

## 2014-08-31 MED ORDER — FENTANYL CITRATE 0.05 MG/ML IJ SOLN
INTRAMUSCULAR | Status: AC
  Filled 2014-08-31: qty 2

## 2014-08-31 MED ORDER — BUPIVACAINE LIPOSOME 1.3 % IJ SUSP
INTRAMUSCULAR | Status: AC
  Filled 2014-08-31: qty 20

## 2014-08-31 MED ORDER — LORAZEPAM 2 MG/ML IJ SOLN
INTRAMUSCULAR | Status: AC
  Filled 2014-08-31: qty 1

## 2014-08-31 MED ORDER — ONDANSETRON HCL 4 MG/2ML IJ SOLN
4.0000 mg | Freq: Once | INTRAMUSCULAR | Status: AC
  Administered 2014-08-31: 4 mg via INTRAVENOUS

## 2014-08-31 MED ORDER — DEXTROSE 5 % IV SOLN
2.0000 g | INTRAVENOUS | Status: DC
  Filled 2014-08-31: qty 2

## 2014-08-31 MED ORDER — ROCURONIUM BROMIDE 100 MG/10ML IV SOLN
INTRAVENOUS | Status: DC | PRN
  Administered 2014-08-31: 5 mg via INTRAVENOUS
  Administered 2014-08-31: 25 mg via INTRAVENOUS
  Administered 2014-08-31: 10 mg via INTRAVENOUS

## 2014-08-31 MED ORDER — HYDROMORPHONE HCL 1 MG/ML IJ SOLN
1.0000 mg | INTRAMUSCULAR | Status: DC | PRN
  Administered 2014-08-31 – 2014-09-03 (×5): 1 mg via INTRAVENOUS
  Filled 2014-08-31 (×5): qty 1

## 2014-08-31 MED ORDER — ONDANSETRON HCL 4 MG/2ML IJ SOLN: 4.0000 mg | Freq: Once | INTRAMUSCULAR | Status: DC | PRN

## 2014-08-31 MED ORDER — NEOSTIGMINE METHYLSULFATE 10 MG/10ML IV SOLN
INTRAVENOUS | Status: DC | PRN
Start: 2014-08-31 — End: 2014-08-31
  Administered 2014-08-31: 2 mg via INTRAVENOUS

## 2014-08-31 MED ORDER — KETOROLAC TROMETHAMINE 30 MG/ML IJ SOLN
30.0000 mg | Freq: Four times a day (QID) | INTRAMUSCULAR | Status: AC | PRN
  Administered 2014-08-31 – 2014-09-02 (×3): 30 mg via INTRAVENOUS
  Filled 2014-08-31 (×2): qty 1

## 2014-08-31 MED ORDER — LIDOCAINE HCL (CARDIAC) 10 MG/ML IV SOLN
INTRAVENOUS | Status: DC | PRN
  Administered 2014-08-31: 20 mg via INTRAVENOUS

## 2014-08-31 MED ORDER — POVIDONE-IODINE 10 % EX OINT
TOPICAL_OINTMENT | CUTANEOUS | Status: DC | PRN
  Administered 2014-08-31: 2 via TOPICAL

## 2014-08-31 MED ORDER — FENTANYL CITRATE 0.05 MG/ML IJ SOLN
25.0000 ug | INTRAMUSCULAR | Status: DC | PRN
  Administered 2014-08-31: 50 ug via INTRAVENOUS

## 2014-08-31 SURGICAL SUPPLY — 71 items
APPLIER CLIP 11 MED OPEN (CLIP)
APPLIER CLIP 13 LRG OPEN (CLIP)
BAG HAMPER (MISCELLANEOUS) ×4 IMPLANT
BARRIER SKIN 2 3/4 (OSTOMY) IMPLANT
BARRIER SKIN 2 3/4 INCH (OSTOMY)
CHLORAPREP W/TINT 26ML (MISCELLANEOUS) ×4 IMPLANT
CLAMP POUCH DRAINAGE QUIET (OSTOMY) IMPLANT
CLIP APPLIE 11 MED OPEN (CLIP) IMPLANT
CLIP APPLIE 13 LRG OPEN (CLIP) IMPLANT
CLOTH BEACON ORANGE TIMEOUT ST (SAFETY) ×4 IMPLANT
COVER LIGHT HANDLE STERIS (MISCELLANEOUS) ×8 IMPLANT
COVER MAYO STAND XLG (DRAPE) ×4 IMPLANT
DRAPE UTILITY W/TAPE 26X15 (DRAPES) ×8 IMPLANT
DRAPE WARM FLUID 44X44 (DRAPE) ×4 IMPLANT
DRSG OPSITE POSTOP 4X10 (GAUZE/BANDAGES/DRESSINGS) ×4 IMPLANT
DRSG OPSITE POSTOP 4X8 (GAUZE/BANDAGES/DRESSINGS) ×8 IMPLANT
DRSG TELFA 3X8 NADH (GAUZE/BANDAGES/DRESSINGS) ×4 IMPLANT
ELECT BLADE 6 FLAT ULTRCLN (ELECTRODE) ×4 IMPLANT
ELECT REM PT RETURN 9FT ADLT (ELECTROSURGICAL) ×4
ELECTRODE REM PT RTRN 9FT ADLT (ELECTROSURGICAL) ×2 IMPLANT
FORMALIN 10 PREFIL 480ML (MISCELLANEOUS) IMPLANT
GLOVE BIOGEL PI IND STRL 7.0 (GLOVE) ×4 IMPLANT
GLOVE BIOGEL PI INDICATOR 7.0 (GLOVE) ×4
GLOVE ECLIPSE 6.5 STRL STRAW (GLOVE) ×16 IMPLANT
GLOVE SURG SS PI 7.5 STRL IVOR (GLOVE) ×8 IMPLANT
GOWN STRL REUS W/ TWL XL LVL3 (GOWN DISPOSABLE) ×4 IMPLANT
GOWN STRL REUS W/TWL LRG LVL3 (GOWN DISPOSABLE) ×16 IMPLANT
GOWN STRL REUS W/TWL XL LVL3 (GOWN DISPOSABLE) ×4
INST SET MAJOR GENERAL (KITS) ×4 IMPLANT
KIT BLADEGUARD II DBL (SET/KITS/TRAYS/PACK) ×4 IMPLANT
KIT ROOM TURNOVER APOR (KITS) ×4 IMPLANT
LIGASURE IMPACT 36 18CM CVD LR (INSTRUMENTS) ×4 IMPLANT
MANIFOLD NEPTUNE II (INSTRUMENTS) ×4 IMPLANT
NEEDLE BIOPSY 14GX4.5 SOFT TIS (NEEDLE) ×4 IMPLANT
NEEDLE HYPO 18GX1.5 BLUNT FILL (NEEDLE) ×4 IMPLANT
NEEDLE HYPO 21X1.5 SAFETY (NEEDLE) ×4 IMPLANT
NEEDLE HYPO 25X1 1.5 SAFETY (NEEDLE) ×4 IMPLANT
NS IRRIG 1000ML POUR BTL (IV SOLUTION) ×8 IMPLANT
PACK ABDOMINAL MAJOR (CUSTOM PROCEDURE TRAY) ×4 IMPLANT
PAD ARMBOARD 7.5X6 YLW CONV (MISCELLANEOUS) ×4 IMPLANT
PENCIL HANDSWITCHING (ELECTRODE) ×4 IMPLANT
POUCH OSTOMY 2 3/4  H 3804 (WOUND CARE)
POUCH OSTOMY 2 PC DRNBL 2.25 (WOUND CARE) IMPLANT
POUCH OSTOMY 2 PC DRNBL 2.75 (WOUND CARE) IMPLANT
POUCH OSTOMY DRNBL 2 1/4 (WOUND CARE)
RELOAD LINEAR CUT PROX 55 BLUE (ENDOMECHANICALS) IMPLANT
RELOAD PROXIMATE 75MM BLUE (ENDOMECHANICALS) ×8 IMPLANT
RETRACTOR WND ALEXIS 25 LRG (MISCELLANEOUS) ×2 IMPLANT
RTRCTR WOUND ALEXIS 25CM LRG (MISCELLANEOUS) ×4
SET BASIN LINEN APH (SET/KITS/TRAYS/PACK) ×4 IMPLANT
SPONGE LAP 18X18 X RAY DECT (DISPOSABLE) ×4 IMPLANT
STAPLER GUN LINEAR PROX 60 (STAPLE) ×4 IMPLANT
STAPLER PROXIMATE 55 BLUE (STAPLE) IMPLANT
STAPLER PROXIMATE 75MM BLUE (STAPLE) ×4 IMPLANT
STAPLER VISISTAT (STAPLE) IMPLANT
SUCTION POOLE TIP (SUCTIONS) ×8 IMPLANT
SUT CHROMIC 0 SH (SUTURE) IMPLANT
SUT CHROMIC 2 0 SH (SUTURE) ×4 IMPLANT
SUT CHROMIC 3 0 SH 27 (SUTURE) IMPLANT
SUT NOVA NAB GS-26 0 60 (SUTURE) IMPLANT
SUT PDS AB 0 CTX 60 (SUTURE) IMPLANT
SUT PDS AB CT VIOLET #0 27IN (SUTURE) ×8 IMPLANT
SUT SILK 2 0 (SUTURE)
SUT SILK 2 0 REEL (SUTURE) IMPLANT
SUT SILK 2-0 18XBRD TIE 12 (SUTURE) IMPLANT
SUT SILK 3 0 SH CR/8 (SUTURE) ×4 IMPLANT
SYR 20CC LL (SYRINGE) ×4 IMPLANT
TOWEL BLUE STERILE X RAY DET (MISCELLANEOUS) ×4 IMPLANT
TOWEL OR 17X26 4PK STRL BLUE (TOWEL DISPOSABLE) ×4 IMPLANT
TRAY FOLEY CATH 16FR SILVER (SET/KITS/TRAYS/PACK) ×4 IMPLANT
YANKAUER SUCT BULB TIP NO VENT (SUCTIONS) ×8 IMPLANT

## 2014-08-31 NOTE — Op Note (Signed)
Patient:  Martha Becker  DOB:  03-Feb-1956  MRN:  [id number]   Preop Diagnosis:  Right colon neoplasm, obstructing, liver lesions  Postop Diagnosis:  Same  Procedure:  Right hemicolectomy, liver biopsy  Surgeon:  Aviva Signs, M.D.  Anes:  Gen. endotracheal  Indications:  Patient is a 58 year old white female who was found by CAT scan recently to have a near obstructing mass in the ascending colon. She is also had a workup which reveals several possible lesions in the liver as well as multiple lung nodules. The patient now presents for a right hemicolectomy with possible liver biopsy. Risks and benefits of the procedure including bleeding, infection, cardiopulmonary difficulties, and a strong possibly malignancy, and the possible blood transfusion were fully explained to the patient, who gave informed consent.  Procedure note:  The patient was placed the supine position. After induction of general endotracheal anesthesia, the abdomen was prepped and draped using usual sterile technique with ChloraPrep. Surgical site confirmation was performed.  A midline incision was made from just above the umbilicus to below the umbilicus. The peritoneal cavity was entered into without difficulty. On inspection of liver, several minute nodules noted in the right lobe of liver. A Tru-Cut needle biopsy of one of these close to the falciform ligament was done and sent to pathology. The puncture site was cauterized using Bovie electrocautery. A large mass lesion was noted in the cecal area of the colon. Multiple lymph nodes were palpable down to the base of the sputum mesenteric artery middle colic artery. The right colon was mobilized along the peritoneal reflection. The hepatic flexure was taken down using the LigaSure. A GIA stapler was placed across the terminal ileum and fired. This was likewise done across the mid transverse colon, proximal to mid colic artery takeoff. The descending colon was within normal  limits. No palpable lesions noted. The mesentery of the ascending colon was divided using the LigaSure. The specimen was removed from the operative field and sent to pathology for further examination. A side-to-side ileocolic anastomosis was performed using a GIA-75 stapler. The enterotomy was closed using a TA 60 stapler. The staple line was bolstered using 3-0 silk sutures. Surrounding greater omentum was then placed over the anastomosis and secured to it using 3-0 silk sutures. The mesenteric defect was closed using a 2-0 chromic gut running suture. The bowel was returned into the abdominal cavity an orderly fashion. The abdominal cavity was copiously irrigated with normal saline. All operating personnel then changed gowns and gloves. A new setup was then used.  The fascia was reapproximated using an 0 PDS running suture. Subcutaneous layer was irrigated normal saline and the skin was closed using staples. A Ointment and dry sterile dressings were applied.  All tape and needle counts were correct at the end of the procedure. The patient was extubated in the operating room and transferred to PACU in stable condition.  Complications:  None  EBL:  25 cc  Specimen:  Right colon, liver biopsy

## 2014-08-31 NOTE — Anesthesia Postprocedure Evaluation (Signed)
  Anesthesia Post-op Note  Patient: Martha Becker  Procedure(s) Performed: Procedure(s): Right Colon Resection  (N/A) LIVER BIOPSY (N/A)  Patient Location: PACU  Anesthesia Type:General  Level of Consciousness: awake and patient cooperative  Airway and Oxygen Therapy: Patient Spontanous Breathing and non-rebreather face mask  Post-op Pain: mild  Post-op Assessment: Post-op Vital signs reviewed, Patient's Cardiovascular Status Stable, Respiratory Function Stable and Patent Airway  Post-op Vital Signs: Reviewed and stable  Last Vitals:  Filed Vitals:   08/31/14 1515  BP: 129/78  Pulse: 71  Temp:   Resp: 20    Complications: No apparent anesthesia complications

## 2014-08-31 NOTE — Consult Note (Signed)
[facility] Consultation Oncology  Name: Martha Becker      MRN: [id number]    Location: A312/A312-01  Date: 08/31/2014 Time:9:01 AM   REFERRING PHYSICIAN:  Radene Gunning, NP  REASON FOR CONSULT:  New diagnosis colon cancer   DIAGNOSIS:  Metastatic malignancy, not yet biopsy proven, likely colonic primary by radiographic review.  HISTORY OF PRESENT ILLNESS:   [name] 58 year old Caucasian female who was directly admitted to the [facility] from Dr. Oneida Alar' office for a a new high grade partial small bowel obstruction associated with a cecal mass who has an unsignificant past medical history according to chart review in [facility].  The patient is seen earlier this AM and she was not too interested in waking up to discuss her current situation and how it relates to the [facility].  Therefore, I ascertained a brief history.  She reports that she lives in [place], [place].  She is not married.  She has 1 living child, a 68 yo son.  Her other son, who was a twin, passed away last year secondary to chronic co-morbid conditions.  She works part time (somewhere) in order to [employer].  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  She admits to EtOH socially, but denies any heavy drinking.  She admits to smoking 1-1.5 ppd when she was young, quitting 10-15 years ago (approximately a 45 pack years).    She admits to nausea without vomiting.  She denies any pain.  She reports a history with bowel habits, improved with probiotics.  She denies any blood in stool, black tarry stool, change in caliber or frequency, pencil thin stools, etc.  She denies hemoptysis.  She admits to RUQ abdominal pain.  She denies any unintentional weight loss, but notes a 5 lb weight loss with changes in her diet.   Chart is reviewed.  I reviewed her labs and imaging.  She is not interested in waking up to review data.  She is scheduled for a right hemicolectomy by Dr. Arnoldo Morale  later today.  After pathology is ascertained, we will see the patient again.  She appears to have a Stage IV malignancy, likely colon primary.   PAST MEDICAL HISTORY:   History reviewed. No pertinent past medical history.  ALLERGIES: Allergies  Allergen Reactions  . Codeine Nausea And Vomiting      MEDICATIONS: I have reviewed the patient's current medications.     PAST SURGICAL HISTORY Past Surgical History  Procedure Laterality Date  . Cesarean section      FAMILY HISTORY: Family History  Problem Relation Age of Onset  . Colon cancer Cousin     age late 40s  . Inflammatory bowel disease Neg Hx     SOCIAL HISTORY:  reports that she has never smoked. She does not have any smokeless tobacco history on file. She reports that she drinks alcohol. She reports that she does not use illicit drugs.  PERFORMANCE STATUS: The patient's performance status is 1 - Symptomatic but completely ambulatory  PHYSICAL EXAM: Most Recent Vital Signs: Blood pressure 93/57, pulse 62, temperature 97.5 F (36.4 C), temperature source Oral, resp. rate 20, height _0  (1.676 m), weight 144 lb 8 oz (65.545 kg), SpO2 98 %. General appearance: cooperative, no distress and uncooperative Head: Normocephalic, without obvious abnormality, atraumatic Neurologic: Grossly normal Lack of patient participation.  LABORATORY DATA:  Results for orders placed or performed during the hospital encounter of 08/28/14 (  from the past 48 hour(s))  CEA     Status: None   Collection Time: 08/29/14 12:35 PM  Result Value Ref Range   CEA 2.2 0.0 - 5.0 ng/mL    Comment: Performed at [facility] metabolic panel     Status: Abnormal   Collection Time: 08/30/14  5:50 AM  Result Value Ref Range   Sodium 139 137 - 147 mEq/L   Potassium 4.1 3.7 - 5.3 mEq/L   Chloride 102 96 - 112 mEq/L   CO2 26 19 - 32 mEq/L   Glucose, Bld 125 (H) 70 - 99 mg/dL   BUN 5 (L) 6 - 23 mg/dL   Creatinine, Ser 0.86 0.50 - 1.10  mg/dL   Calcium 9.3 8.4 - 10.5 mg/dL   GFR calc non Af Amer 74 (L) >90 mL/min   GFR calc Af Amer 86 (L) >90 mL/min    Comment: (NOTE) The eGFR has been calculated using the CKD EPI equation. This calculation has not been validated in all clinical situations. eGFR's persistently <90 mL/min signify possible Chronic Kidney Disease.    Anion gap 11 5 - 15  Hemoglobin A1c     Status: None   Collection Time: 08/30/14  5:50 AM  Result Value Ref Range   Hgb A1c MFr Bld 5.5 <5.7 %    Comment: (NOTE)                                                                       According to the ADA Clinical Practice Recommendations for 2011, when HbA1c is used as a screening test:  >=6.5%   Diagnostic of Diabetes Mellitus           (if abnormal result is confirmed) 5.7-6.4%   Increased risk of developing Diabetes Mellitus References:Diagnosis and Classification of Diabetes Mellitus,Diabetes UXLK,4401,02(VOZDG 1):S62-S69 and Standards of Medical Care in         Diabetes - 2011,Diabetes Care,2011,34 (Suppl 1):S11-S61.    Mean Plasma Glucose 111 <117 mg/dL    Comment: Performed at [facility]  CBC WITH DIFFERENTIAL     Status: Abnormal   Collection Time: 08/30/14  5:52 AM  Result Value Ref Range   WBC 7.3 4.0 - 10.5 K/uL   RBC 3.59 (L) 3.87 - 5.11 MIL/uL   Hemoglobin 10.6 (L) 12.0 - 15.0 g/dL   HCT 31.4 (L) 36.0 - 46.0 %   MCV 87.5 78.0 - 100.0 fL   MCH 29.5 26.0 - 34.0 pg   MCHC 33.8 30.0 - 36.0 g/dL   RDW 14.9 11.5 - 15.5 %   Platelets 415 (H) 150 - 400 K/uL   Neutrophils Relative % 66 43 - 77 %   Neutro Abs 4.8 1.7 - 7.7 K/uL   Lymphocytes Relative 20 12 - 46 %   Lymphs Abs 1.4 0.7 - 4.0 K/uL   Monocytes Relative 9 3 - 12 %   Monocytes Absolute 0.7 0.1 - 1.0 K/uL   Eosinophils Relative 5 0 - 5 %   Eosinophils Absolute 0.4 0.0 - 0.7 K/uL   Basophils Relative 0 0 - 1 %   Basophils Absolute 0.0 0.0 - 0.1 K/uL  Prepare RBC (crossmatch)     Status: None  Collection Time: 08/30/14   7:53 AM  Result Value Ref Range   Order Confirmation ORDER PROCESSED BY BLOOD BANK   Type and screen     Status: None (Preliminary result)   Collection Time: 08/30/14  7:53 AM  Result Value Ref Range   ABO/RH(D) A POS    Antibody Screen NEG    Sample Expiration 09/02/2014    Unit Number [id number]    Blood Component Type RED CELLS,LR    Unit division 00    Status of Unit ALLOCATED    Transfusion Status OK TO TRANSFUSE    Crossmatch Result Compatible    Unit Number [id number]    Blood Component Type RED CELLS,LR    Unit division 00    Status of Unit ALLOCATED    Transfusion Status OK TO TRANSFUSE    Crossmatch Result Compatible   ABO/Rh     Status: None   Collection Time: 08/30/14  7:54 AM  Result Value Ref Range   ABO/RH(D) A POS   Surgical pcr screen     Status: None   Collection Time: 08/30/14 12:56 PM  Result Value Ref Range   MRSA, PCR NEGATIVE NEGATIVE   Staphylococcus aureus NEGATIVE NEGATIVE    Comment:        The Xpert SA Assay (FDA approved for NASAL specimens in patients over 39 years of age), is one component of a comprehensive surveillance program.  Test performance has been validated by [employer] for patients greater than or equal to 28 year old. It is not intended to diagnose infection nor to guide or monitor treatment.       RADIOGRAPHY: No results found.     PATHOLOGY:  Pending  ASSESSMENT:  1. Metastatic, Stage IV malignancy, S/P right hemicolectomy for nearly obstructing colonic mass, suspect colorectal carcinoma with contralateral retroperitoneal spread, peritoneal carcinomatosis, and pulmonary metastases with a small right pleural effusion. 2. High grade small bowel obstruction  Patient Active Problem List   Diagnosis Date Noted  . Lung metastasis 08/29/2014  . Anemia of chronic disease 08/29/2014  . SBO (small bowel obstruction) 08/29/2014  . Colonic obstruction 08/28/2014  . Nausea with vomiting 08/28/2014  . Pulmonary  nodules 08/28/2014  . Cecal cancer 08/28/2014  . Peritoneal carcinomatosis 08/28/2014  . Abnormal CT scan, colon 08/21/2014  . RLQ abdominal pain 08/21/2014    PLAN:  1. Chart reviewed. 2. Imaging studies reviewed 3. Labs reviewed 4. Patient was not participating this AM and therefore, I ascertained a brief history.  Physical exam was deferred due to lack of patient participation.   5. After biopsy results, we will see the patient again, discussed her malignancy, its treatment options, prognosis, etc. 6. Will follow along as an inpatient.  She will need outpatient follow-up at the [facility] after discharge.  All questions were answered. The patient knows to call the clinic with any problems, questions or concerns. We can certainly see the patient much sooner if necessary.  Patient and plan discussed with Dr. Ancil Linsey and she is in agreement with the aforementioned.   KEFALAS,THOMAS  08/31/2014      Agree with above.  We will check back with the patient again prior to her discharge to review available records and discuss current diagnosis. Donald Pore MD

## 2014-08-31 NOTE — Anesthesia Preprocedure Evaluation (Signed)
Anesthesia Evaluation  Patient identified by MRN, date of birth, ID band Patient awake    Reviewed: Allergy & Precautions, H&P , NPO status , Patient's Chart, lab work & pertinent test results  Airway Mallampati: II  TM Distance: >3 FB     Dental  (+) Poor Dentition, Missing   Pulmonary  Lung mets  breath sounds clear to auscultation        Cardiovascular negative cardio ROS  Rhythm:Regular Rate:Normal     Neuro/Psych    GI/Hepatic Obstructing colon neoplasm    Endo/Other    Renal/GU      Musculoskeletal   Abdominal   Peds  Hematology  (+) anemia ,   Anesthesia Other Findings   Reproductive/Obstetrics                             Anesthesia Physical Anesthesia Plan  ASA: III  Anesthesia Plan: General   Post-op Pain Management:    Induction: Intravenous, Rapid sequence and Cricoid pressure planned  Airway Management Planned: Oral ETT  Additional Equipment:   Intra-op Plan:   Post-operative Plan: Extubation in OR  Informed Consent: I have reviewed the patients History and Physical, chart, labs and discussed the procedure including the risks, benefits and alternatives for the proposed anesthesia with the patient or authorized representative who has indicated his/her understanding and acceptance.     Plan Discussed with:   Anesthesia Plan Comments:         Anesthesia Quick Evaluation

## 2014-08-31 NOTE — Transfer of Care (Signed)
Immediate Anesthesia Transfer of Care Note  Patient: Martha Becker  Procedure(s) Performed: Procedure(s) (LRB): Right Colon Resection  (N/A) LIVER BIOPSY (N/A)  Patient Location: PACU  Anesthesia Type: General  Level of Consciousness: awake  Airway & Oxygen Therapy: Patient Spontanous Breathing and non-rebreather face mask  Post-op Assessment: Report given to PACU RN, Post -op Vital signs reviewed and stable and Patient moving all extremities  Post vital signs: Reviewed and stable  Complications: No apparent anesthesia complications

## 2014-09-01 ENCOUNTER — Encounter (HOSPITAL_COMMUNITY): Payer: Self-pay | Admitting: General Surgery

## 2014-09-01 LAB — BASIC METABOLIC PANEL
Anion gap: 5 (ref 5–15)
BUN: <5 mg/dL — ABNORMAL LOW (ref 6–23)
CALCIUM: 8.7 mg/dL (ref 8.4–10.5)
CO2: 29 mmol/L (ref 19–32)
Chloride: 99 mEq/L (ref 96–112)
Creatinine, Ser: 0.78 mg/dL (ref 0.50–1.10)
GFR calc Af Amer: >90 mL/min (ref >90)
GFR calc non Af Amer: >90 mL/min (ref >90)
GLUCOSE: 112 mg/dL — AB (ref 70–99)
Potassium: 4.3 mmol/L (ref 3.5–5.1)
SODIUM: 133 mmol/L — AB (ref 135–145)

## 2014-09-01 LAB — CBC
HEMATOCRIT: 32.6 % — AB (ref 36.0–46.0)
HEMOGLOBIN: 10.9 g/dL — AB (ref 12.0–15.0)
MCH: 28.9 pg (ref 26.0–34.0)
MCHC: 33.4 g/dL (ref 30.0–36.0)
MCV: 86.5 fL (ref 78.0–100.0)
Platelets: 408 10*3/uL — ABNORMAL HIGH (ref 150–400)
RBC: 3.77 MIL/uL — ABNORMAL LOW (ref 3.87–5.11)
RDW: 14.6 % (ref 11.5–15.5)
WBC: 9.4 10*3/uL (ref 4.0–10.5)

## 2014-09-01 LAB — PHOSPHORUS: Phosphorus: 3.4 mg/dL (ref 2.3–4.6)

## 2014-09-01 LAB — MAGNESIUM: Magnesium: 1.6 mg/dL (ref 1.5–2.5)

## 2014-09-01 MED ORDER — SODIUM CHLORIDE 0.9 % IV BOLUS (SEPSIS)
500.0000 mL | Freq: Once | INTRAVENOUS | Status: AC
  Administered 2014-09-01: 500 mL via INTRAVENOUS

## 2014-09-01 MED ORDER — PROMETHAZINE HCL 25 MG/ML IJ SOLN
25.0000 mg | Freq: Four times a day (QID) | INTRAMUSCULAR | Status: DC | PRN
  Administered 2014-09-01: 25 mg via INTRAVENOUS
  Filled 2014-09-01 (×2): qty 1

## 2014-09-01 NOTE — Progress Notes (Signed)
1 Day Post-Op  Subjective: Moderate incisional pain, well controlled.  Objective: Vital signs in last 24 hours: Temp:  [97.5 F (36.4 C)-98.9 F (37.2 C)] 98.6 F (37 C) (12/22 0625) Pulse Rate:  [58-78] 63 (12/22 0625) Resp:  [13-26] 16 (12/22 0625) BP: (106-129)/(63-78) 112/71 mmHg (12/22 0625) SpO2:  [94 %-100 %] 100 % (12/22 0625) Last BM Date: 08/31/14  Intake/Output from previous day: 12/21 0701 - 12/22 0700 In: 1600 [I.V.:1600] Out: 365 [Urine:340; Blood:25] Intake/Output this shift:    General appearance: alert, cooperative and no distress Resp: clear to auscultation bilaterally Cardio: regular rate and rhythm, S1, S2 normal, no murmur, click, rub or gallop GI: Soft, incision healing well. No bowel sounds appreciated yet. Nondistended.  Lab Results:   Recent Labs  08/30/14 0552 09/01/14 0547  WBC 7.3 9.4  HGB 10.6* 10.9*  HCT 31.4* 32.6*  PLT 415* 408*   BMET  Recent Labs  08/30/14 0550 09/01/14 0547  NA 139 133*  K 4.1 4.3  CL 102 99  CO2 26 29  GLUCOSE 125* 112*  BUN 5* <5*  CREATININE 0.86 0.78  CALCIUM 9.3 8.7   PT/INR No results for input(s): LABPROT, INR in the last 72 hours.  Studies/Results: No results found.  Anti-infectives: Anti-infectives    Start     Dose/Rate Route Frequency Ordered Stop   09/01/14 0600  cefoTEtan (CEFOTAN) 2 g in dextrose 5 % 50 mL IVPB  Status:  Discontinued     2 g100 mL/hr over 30 Minutes Intravenous On call to O.R. 08/31/14 1227 08/31/14 1235   08/31/14 1245  cefoTEtan (CEFOTAN) 2 g in dextrose 5 % 50 mL IVPB     2 g100 mL/hr over 30 Minutes Intravenous On call to O.R. 08/31/14 1235 08/31/14 1349   08/30/14 1400  neomycin (MYCIFRADIN) tablet 1,000 mg     1,000 mg Oral 3 times per day 08/30/14 1110 08/31/14 0031   08/30/14 1400  erythromycin (E-MYCIN) tablet 1,000 mg     1,000 mg Oral 3 times per day 08/30/14 1110 08/31/14 0032   08/30/14 1115  cefoTEtan (CEFOTAN) 2 g in dextrose 5 % 50 mL IVPB  Status:   Discontinued     2 g100 mL/hr over 30 Minutes Intravenous On call to O.R. 08/30/14 1110 08/30/14 1113   08/28/14 1600  ciprofloxacin (CIPRO) IVPB 400 mg     400 mg200 mL/hr over 60 Minutes Intravenous Every 12 hours 08/28/14 1535        Assessment/Plan: s/p Procedure(s): Right Colon Resection  LIVER BIOPSY Impression: Stable on postoperative day 1. Urine output was low, thus will give Becker fluid bolus. Lab work is within normal limits. The patient is fully aware that I'm concerned that she does have metastatic colon cancer. Awaiting return of bowel function.  LOS: 4 days    Martha Becker 09/01/2014

## 2014-09-01 NOTE — Addendum Note (Signed)
Addendum  created 09/01/14 1106 by Charmaine Downs, CRNA   Modules edited: Notes Section   Notes Section:  File: [id number]

## 2014-09-01 NOTE — Care Management Utilization Note (Signed)
UR complete 

## 2014-09-01 NOTE — Anesthesia Postprocedure Evaluation (Signed)
  Anesthesia Post-op Note  Patient: Martha Becker  Procedure(s) Performed: Procedure(s): Right Colon Resection  (N/A) LIVER BIOPSY (N/A)  Patient Location: room 312  Anesthesia Type:General  Level of Consciousness: awake, alert , oriented and patient cooperative  Airway and Oxygen Therapy: Patient Spontanous Breathing  Post-op Pain: 4 /10, moderate  Post-op Assessment: Post-op Vital signs reviewed, Patient's Cardiovascular Status Stable, Respiratory Function Stable, Patent Airway and No signs of Nausea or vomiting  Post-op Vital Signs: Reviewed and stable  Last Vitals:  Filed Vitals:   09/01/14 0625  BP: 112/71  Pulse: 63  Temp: 37 C  Resp: 16    Complications: No apparent anesthesia complications

## 2014-09-01 NOTE — Care Management Note (Addendum)
    Page 1 of 1   09/03/2014     12:32:37 PM CARE MANAGEMENT NOTE 09/03/2014  Patient:  Martha Becker, Martha Becker   Account Number:  [id number]  Date Initiated:  09/01/2014  Documentation initiated by:  Jolene Provost  Subjective/Objective Assessment:   Pt is from home with self care. Pt admitted for partial hemicolectomy. Pt independent at home with self care. Pt plans to discharge back home with self care. Pt had no CM needs at this time. Will continue to follow for CM needs.     Action/Plan:   Anticipated DC Date:  09/03/2014   Anticipated DC Plan:  [place]  CM consult      Choice offered to / List presented to:             Status of service:  Completed, signed off Medicare Important Message given?   (If response is "NO", the following Medicare IM given date fields will be blank) Date Medicare IM given:   Medicare IM given by:   Date Additional Medicare IM given:   Additional Medicare IM given by:    Discharge Disposition:  HOME/SELF CARE  Per UR Regulation:  Reviewed for med. necessity/level of care/duration of stay  If discussed at [place] of Stay Meetings, dates discussed:   09/03/2014    Comments:  09/03/2014 1200 Jessic aChildress, RN, MSN, PCCN Pt plans to discharge home with self care in next 24-48 hours. No CM needs at this time.  09/01/2014 [place], RN, MSN, [facility]

## 2014-09-02 DIAGNOSIS — C169 Malignant neoplasm of stomach, unspecified: Secondary | ICD-10-CM

## 2014-09-02 LAB — BASIC METABOLIC PANEL
ANION GAP: 5 (ref 5–15)
CALCIUM: 9 mg/dL (ref 8.4–10.5)
CHLORIDE: 99 meq/L (ref 96–112)
CO2: 30 mmol/L (ref 19–32)
CREATININE: 0.73 mg/dL (ref 0.50–1.10)
GFR calc Af Amer: >90 mL/min (ref >90)
GFR calc non Af Amer: >90 mL/min (ref >90)
Glucose, Bld: 107 mg/dL — ABNORMAL HIGH (ref 70–99)
Potassium: 3.8 mmol/L (ref 3.5–5.1)
Sodium: 134 mmol/L — ABNORMAL LOW (ref 135–145)

## 2014-09-02 LAB — CBC
HEMATOCRIT: 30.3 % — AB (ref 36.0–46.0)
Hemoglobin: 10.3 g/dL — ABNORMAL LOW (ref 12.0–15.0)
MCH: 29.3 pg (ref 26.0–34.0)
MCHC: 34 g/dL (ref 30.0–36.0)
MCV: 86.1 fL (ref 78.0–100.0)
PLATELETS: 377 10*3/uL (ref 150–400)
RBC: 3.52 MIL/uL — ABNORMAL LOW (ref 3.87–5.11)
RDW: 14.8 % (ref 11.5–15.5)
WBC: 6.7 10*3/uL (ref 4.0–10.5)

## 2014-09-02 LAB — PHOSPHORUS: PHOSPHORUS: 3.2 mg/dL (ref 2.3–4.6)

## 2014-09-02 LAB — MAGNESIUM: MAGNESIUM: 1.7 mg/dL (ref 1.5–2.5)

## 2014-09-02 NOTE — Progress Notes (Signed)
2 Days Post-Op  Subjective: No bowel movement or flatus yet. Incisional pain well controlled.  Objective: Vital signs in last 24 hours: Temp:  [98.2 F (36.8 C)-99.1 F (37.3 C)] 98.6 F (37 C) (12/23 0634) Pulse Rate:  [65-72] 72 (12/23 0634) Resp:  [16] 16 (12/23 0634) BP: (104-126)/(63-78) 116/75 mmHg (12/23 0634) SpO2:  [94 %-97 %] 96 % (12/23 0634) Last BM Date: 08/31/14  Intake/Output from previous day: 12/22 0701 - 12/23 0700 In: 3365.4 [P.O.:480; I.V.:2685.4; IV Piggyback:200] Out: 4600 [Urine:4600] Intake/Output this shift:    General appearance: alert, cooperative and no distress Resp: clear to auscultation bilaterally Cardio: regular rate and rhythm, S1, S2 normal, no murmur, click, rub or gallop GI: Soft, incision healing well. Minimal bowel sounds appreciated.  Lab Results:   Recent Labs  09/01/14 0547 09/02/14 0610  WBC 9.4 6.7  HGB 10.9* 10.3*  HCT 32.6* 30.3*  PLT 408* 377   BMET  Recent Labs  09/01/14 0547 09/02/14 0610  NA 133* 134*  K 4.3 3.8  CL 99 99  CO2 29 30  GLUCOSE 112* 107*  BUN <5* <5*  CREATININE 0.78 0.73  CALCIUM 8.7 9.0   PT/INR No results for input(s): LABPROT, INR in the last 72 hours.  Studies/Results: No results found.  Anti-infectives: Anti-infectives    Start     Dose/Rate Route Frequency Ordered Stop   09/01/14 0600  cefoTEtan (CEFOTAN) 2 g in dextrose 5 % 50 mL IVPB  Status:  Discontinued     2 g100 mL/hr over 30 Minutes Intravenous On call to O.R. 08/31/14 1227 08/31/14 1235   08/31/14 1245  cefoTEtan (CEFOTAN) 2 g in dextrose 5 % 50 mL IVPB     2 g100 mL/hr over 30 Minutes Intravenous On call to O.R. 08/31/14 1235 08/31/14 1349   08/30/14 1400  neomycin (MYCIFRADIN) tablet 1,000 mg     1,000 mg Oral 3 times per day 08/30/14 1110 08/31/14 0031   08/30/14 1400  erythromycin (E-MYCIN) tablet 1,000 mg     1,000 mg Oral 3 times per day 08/30/14 1110 08/31/14 0032   08/30/14 1115  cefoTEtan (CEFOTAN) 2 g in  dextrose 5 % 50 mL IVPB  Status:  Discontinued     2 g100 mL/hr over 30 Minutes Intravenous On call to O.R. 08/30/14 1110 08/30/14 1113   08/28/14 1600  ciprofloxacin (CIPRO) IVPB 400 mg     400 mg200 mL/hr over 60 Minutes Intravenous Every 12 hours 08/28/14 1535        Assessment/Plan: s/p Procedure(s): Right Colon Resection  LIVER BIOPSY Impression: Stable postoperatively. Awaiting return of bowel function. Will start patient ambulating.  LOS: 5 days    Stylianos Stradling A 09/02/2014

## 2014-09-02 NOTE — Progress Notes (Signed)
Martha Becker   DOB:1955-11-01   PR#:[id number]    Subjective: doing better today.  Aware of possibility of stage IV CRC. Somewhat anxious but tryint to stay positive.  Objective:  Filed Vitals:   09/02/14 0634  BP: 116/75  Pulse: 72  Temp: 98.6 F (37 C)  Resp: 16     Intake/Output Summary (Last 24 hours) at 09/02/14 1403 Last data filed at 09/02/14 0500  Gross per 24 hour  Intake 3125.42 ml  Output   4600 ml  Net -1474.58 ml    GENERAL:alert, no distress and comfortable SKIN: skin color, texture, turgor are normal, no rashes or significant lesions EYES: normal, Conjunctiva are pink and non-injected, sclera clear OROPHARYNX:no exudate, no erythema and lips, buccal mucosa, and tongue normal  NECK: supple, thyroid normal size, non-tender, without nodularity LYMPH:  no palpable lymphadenopathy in the cervical, axillary or inguinal LUNGS: clear to auscultation and percussion with normal breathing effort HEART: regular rate & rhythm and no murmurs and no lower extremity edema ABDOMEN:Incision well healing, hypoactive BS NEURO: alert & oriented x 3 with fluent speech, no focal motor/sensory deficits   Labs:  Lab Results  Component Value Date   WBC 6.7 09/02/2014   HGB 10.3* 09/02/2014   HCT 30.3* 09/02/2014   MCV 86.1 09/02/2014   PLT 377 09/02/2014   NEUTROABS 4.8 08/30/2014    Lab Results  Component Value Date   NA 134* 09/02/2014   K 3.8 09/02/2014   CL 99 09/02/2014   CO2 30 09/02/2014    Studies:  Path Pending  Assessment & Plan:  Pleasant 58 year old female with 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 Doing fairly well. Possible discharge tomorrow.  Will arrange for our patient navigator to touch base with her at the beginning of the week and plan on seeing her in the office right after the new year. Please call with problems or concerns.Molli Hazard, MD 09/02/2014  2:03 PM

## 2014-09-03 LAB — TYPE AND SCREEN
ABO/RH(D): A POS
ANTIBODY SCREEN: NEGATIVE
UNIT DIVISION: 0
Unit division: 0

## 2014-09-03 MED ORDER — OXYCODONE-ACETAMINOPHEN 7.5-325 MG PO TABS: 1.0000 | ORAL_TABLET | ORAL | Status: DC | PRN

## 2014-09-03 MED ORDER — OXYCODONE-ACETAMINOPHEN 5-325 MG PO TABS
1.0000 | ORAL_TABLET | ORAL | Status: DC | PRN
  Administered 2014-09-03 – 2014-09-04 (×4): 2 via ORAL
  Filled 2014-09-03 (×5): qty 2

## 2014-09-03 MED ORDER — MAGNESIUM HYDROXIDE 400 MG/5ML PO SUSP
30.0000 mL | Freq: Two times a day (BID) | ORAL | Status: DC
  Administered 2014-09-03: 30 mL via ORAL
  Filled 2014-09-03 (×2): qty 30

## 2014-09-03 MED ORDER — ONDANSETRON HCL 4 MG PO TABS
4.0000 mg | ORAL_TABLET | Freq: Once | ORAL | Status: AC
Start: 2014-09-03 — End: 2014-09-03
  Administered 2014-09-03: 4 mg via ORAL
  Filled 2014-09-03: qty 1

## 2014-09-03 NOTE — Progress Notes (Signed)
3 Days Post-Op  Subjective: No flatus or bowel movement yet. Some indigestion noted. No emesis noted.  Objective: Vital signs in last 24 hours: Temp:  [98.6 F (37 C)] 98.6 F (37 C) (12/24 0600) Pulse Rate:  [72-75] 75 (12/24 0600) Resp:  [20] 20 (12/24 0600) BP: (113-117)/(59-83) 117/83 mmHg (12/24 0600) SpO2:  [65 %-98 %] 98 % (12/24 0600) Last BM Date: 08/31/14  Intake/Output from previous day: 12/23 0701 - 12/24 0700 In: 480 [P.O.:480] Out: -  Intake/Output this shift:    General appearance: alert, cooperative and no distress Resp: clear to auscultation bilaterally Cardio: regular rate and rhythm, S1, S2 normal, no murmur, click, rub or gallop GI: Soft, incision healing well. Occasional bowel sounds appreciated.  Lab Results:   Recent Labs  09/01/14 0547 09/02/14 0610  WBC 9.4 6.7  HGB 10.9* 10.3*  HCT 32.6* 30.3*  PLT 408* 377   BMET  Recent Labs  09/01/14 0547 09/02/14 0610  NA 133* 134*  K 4.3 3.8  CL 99 99  CO2 29 30  GLUCOSE 112* 107*  BUN <5* <5*  CREATININE 0.78 0.73  CALCIUM 8.7 9.0   PT/INR No results for input(s): LABPROT, INR in the last 72 hours.  Studies/Results: No results found.  Anti-infectives: Anti-infectives    Start     Dose/Rate Route Frequency Ordered Stop   09/01/14 0600  cefoTEtan (CEFOTAN) 2 g in dextrose 5 % 50 mL IVPB  Status:  Discontinued     2 g100 mL/hr over 30 Minutes Intravenous On call to O.R. 08/31/14 1227 08/31/14 1235   08/31/14 1245  cefoTEtan (CEFOTAN) 2 g in dextrose 5 % 50 mL IVPB     2 g100 mL/hr over 30 Minutes Intravenous On call to O.R. 08/31/14 1235 08/31/14 1349   08/30/14 1400  neomycin (MYCIFRADIN) tablet 1,000 mg     1,000 mg Oral 3 times per day 08/30/14 1110 08/31/14 0031   08/30/14 1400  erythromycin (E-MYCIN) tablet 1,000 mg     1,000 mg Oral 3 times per day 08/30/14 1110 08/31/14 0032   08/30/14 1115  cefoTEtan (CEFOTAN) 2 g in dextrose 5 % 50 mL IVPB  Status:  Discontinued     2 g100  mL/hr over 30 Minutes Intravenous On call to O.R. 08/30/14 1110 08/30/14 1113   08/28/14 1600  ciprofloxacin (CIPRO) IVPB 400 mg     400 mg200 mL/hr over 60 Minutes Intravenous Every 12 hours 08/28/14 1535        Assessment/Plan: s/p Procedure(s): Right Colon Resection  LIVER BIOPSY Impression: Stable, awaiting full return of bowel function. Final pathology still pending. If patient does have a bowel movement today, will discharge home with follow-up in my office next week.  LOS: 6 days    Amarissa Koerner A 09/03/2014

## 2014-09-03 NOTE — Progress Notes (Signed)
Patient complaining of 10/10 pain.  Notified MD.  Started new IV in patient's right forearm.  Patient received IV pain medication.  Will continue to monitor patient.

## 2014-09-03 NOTE — Progress Notes (Signed)
Patient complaining of nausea and has no IV access.  Attempted to start new IV in patient but patient refused.  Notified MD.  Received order for one time dose of Zofran, 4 mg tablet.  Will continue to monitor patient.

## 2014-09-04 MED ORDER — ONDANSETRON HCL 4 MG PO TABS: 4.0000 mg | ORAL_TABLET | Freq: Three times a day (TID) | ORAL | Status: DC | PRN

## 2014-09-04 NOTE — Progress Notes (Signed)
Patient with orders to be discharge home. Discharge instructions given, patient verbalized understanding. Prescriptions given. There is no pharmacy open within a 20 mile radius. Dr. Arnoldo Morale notified. New order for pre filled prescriptions, Percocet 5-325mg , 6 tablets and Zofran 4mg , 4 tablets from Emergency Department. This was given to patient till pharmacy is open tomorrow. Patient stable. Patient left in private vehicle with family.

## 2014-09-04 NOTE — Discharge Summary (Signed)
Physician Discharge Summary  Patient ID: Martha Becker MRN: [id number] DOB/AGE: 1956-03-03 58 y.o.  Admit date: 08/28/2014 Discharge date: 09/04/2014  Admission Diagnoses: Colon neoplasm, bowel obstruction  Discharge Diagnoses: Same, T4, N1, M? Adenocarcinoma of colon Principal Problem:   Colonic obstruction Active Problems:   RLQ abdominal pain   Nausea with vomiting   Pulmonary nodules   Cecal cancer   Peritoneal carcinomatosis   Lung metastasis   Anemia of chronic disease   SBO (small bowel obstruction)   Discharged Condition: good  Hospital Course: Patient is a 58 year old white female who was found on an outpatient CT scan the abdomen to have a near obstructing mass in the ascending colon. Further workup revealed pulmonary nodules on CT scan the chest. She underwent a right hemicolectomy with liver biopsy on 08/31/2014. Her preoperative CEA level was 2.2. Her diet was advanced without difficulty once her bowel function returned. She has been seen by oncology and they will see her as an outpatient. Final pathology revealed a T4, N1, M? Adenocarcinoma of the colon. Her liver biopsy was negative. She is being discharged home in good improving condition. She is fully aware of her diagnosis.  Treatments: surgery: Right hemicolectomy, liver biopsy on 08/31/2014  Discharge Exam: Blood pressure 113/70, pulse 63, temperature 97.9 F (36.6 C), temperature source Oral, resp. rate 18, height 5' 6"  (1.676 m), weight 65.545 kg (144 lb 8 oz), SpO2 97 %. General appearance: alert, cooperative and no distress Resp: clear to auscultation bilaterally Cardio: regular rate and rhythm, S1, S2 normal, no murmur, click, rub or gallop GI: Soft. Incision healing well. Bowel sounds active.  Disposition: 01-Home or Self Care     Medication List    TAKE these medications        b complex-C-E-zinc tablet  Take 1 tablet by mouth daily.     Calcium + D3 600-200 MG-UNIT Tabs  Take 1 tablet by  mouth daily.     ciprofloxacin 500 MG tablet  Commonly known as:  CIPRO  Take 1 tablet (500 mg total) by mouth 2 (two) times daily.     ferrous sulfate 325 (65 FE) MG tablet  Take 325 mg by mouth daily with breakfast.     Fish Oil 1200 MG Caps  Take 1,200 mg by mouth 2 (two) times daily.     metroNIDAZOLE 500 MG tablet  Commonly known as:  FLAGYL  Take 1 tablet (500 mg total) by mouth 3 (three) times daily.     ondansetron 4 MG tablet  Commonly known as:  ZOFRAN  Take 1 tablet (4 mg total) by mouth every 8 (eight) hours as needed for nausea or vomiting.     oxyCODONE-acetaminophen 7.5-325 MG per tablet  Commonly known as:  PERCOCET  Take 1-2 tablets by mouth every 4 (four) hours as needed.     peg 3350 powder 100 G Solr  Commonly known as:  MOVIPREP  Take 1 kit (200 g total) by mouth once.     PROBIOTIC DAILY PO  Take 1 tablet by mouth daily.     vitamin B-12 1000 MCG tablet  Commonly known as:  CYANOCOBALAMIN  Take 1,000 mcg by mouth daily.           Follow-up Information    Follow up with Jamesetta So, MD. Schedule an appointment as soon as possible for a visit on 09/10/2014.   Specialty:  General Surgery   Contact information:   [address] [address] [phone]  SignedAviva Signs A 09/04/2014, 10:24 AM

## 2014-09-04 NOTE — Progress Notes (Signed)
Patient reports that she is feeling better and pain is being managed with PO pain medication. Instructed patient to not let pain get to 7/10-10/10 before asking for pain medication. Patient voiced understanding. Patient also reports that she is now passing gas and had another small bowel movement.

## 2014-09-07 MED FILL — Oxycodone w/ Acetaminophen Tab 5-325 MG: ORAL | Qty: 6 | Status: AC

## 2014-09-07 MED FILL — Ondansetron HCl Tab 4 MG: ORAL | Qty: 4 | Status: AC

## 2014-09-14 ENCOUNTER — Encounter (HOSPITAL_COMMUNITY): Payer: Self-pay

## 2014-09-14 NOTE — Progress Notes (Signed)
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 Lorenda Peck 09/01/2014  CEA 2.2 ng/ml prior to surgery  CURRENT THERAPY:  INTERVAL HISTORY: Martha Becker 59 y.o. female returns for follow-up of newly diagnosed Colon Cancer. She is doing very well after her surgery. She was first seen in the ED at [facility] in September.  She had an appointment arranged with GI after that visit. She had to cancel that appointment, but notes she was feeling well.. She then followed up with the PA at her PCP's office. She was referred back to Dr. Oneida Alar but prior to that appointment was admitted with a SBO.  She is doing well from a surgical perspective.  Her diagnosis has taken her completely off guard.  MEDICAL HISTORY: Past Medical History  Diagnosis Date  . Colon cancer     has Abnormal CT scan, colon; RLQ abdominal pain; Colonic obstruction; Nausea with vomiting; Pulmonary nodules; Cecal cancer; Peritoneal carcinomatosis; Lung metastasis; Anemia of chronic disease; SBO (small bowel obstruction); and Hypothyroidism on her problem list.     No history exists.     is allergic to codeine.  Martha Becker had no medications administered during this visit.  SURGICAL HISTORY: Past Surgical History  Procedure Laterality Date  . Cesarean section    . Partial colectomy N/A 08/31/2014    Procedure: Right Colon Resection ;  Surgeon: Jamesetta So, MD;  Location: [facility];  Service: General;  Laterality: N/A;  . Liver biopsy N/A 08/31/2014    Procedure: LIVER BIOPSY;  Surgeon: Jamesetta So, MD;  Location: [facility];  Service: General;  Laterality: N/A;    SOCIAL HISTORY: History   Social History  . Marital Status: Single    Spouse Name: N/A    Number of Children: 2  . Years of Education: N/A   Occupational History  . Not on file.   Social History Main Topics  . Smoking status: Former Smoker -- 1.50 packs/day for 18 years      Types: Cigarettes    Quit date: 09/15/1994  . Smokeless tobacco: Not on file  . Alcohol Use: 0.6 oz/week    0 Not specified, 1 Glasses of wine per week     Comment: Maybe 1 glass a week  . Drug Use: No  . Sexual Activity: Not on file   Other Topics Concern  . Not on file   Social History Narrative  Had twins, one son had multiple complications at birth  Died at age 25. Works part time for [employer] in [place]. Divorced 6 years ago.   FAMILY HISTORY: Family History  Problem Relation Age of Onset  . Colon cancer Cousin     age late 70s  . Inflammatory bowel disease Neg Hx     Review of Systems  Constitutional: Negative.   HENT: Negative.   Eyes: Negative.   Respiratory: Negative.   Cardiovascular: Negative.   Gastrointestinal: Positive for constipation. Negative for heartburn, nausea, vomiting, abdominal pain, blood in stool and melena.  Genitourinary: Negative.   Musculoskeletal: Negative.   Skin: Negative.   Neurological: Negative.   Endo/Heme/Allergies: Negative.   Psychiatric/Behavioral: Negative.     PHYSICAL EXAMINATION  ECOG PERFORMANCE STATUS: 0 - Asymptomatic  Filed Vitals:   09/15/14 1204  BP: 123/91  Pulse: 93  Temp: 97.7 F (36.5 C)  Resp: 20    Physical Exam  Constitutional: She is  oriented to person, place, and time and well-developed, well-nourished, and in no distress.  HENT:  Head: Normocephalic and atraumatic.  Nose: Nose normal.  Mouth/Throat: Oropharynx is clear and moist. No oropharyngeal exudate.  Eyes: Conjunctivae and EOM are normal. Pupils are equal, round, and reactive to light. Right eye exhibits no discharge. Left eye exhibits no discharge. No scleral icterus.  Neck: Normal range of motion. Neck supple. No tracheal deviation present. No thyromegaly present.  Cardiovascular: Normal rate, regular rhythm and normal heart sounds.  Exam reveals no gallop and no friction rub.   No murmur heard. Pulmonary/Chest: Effort normal and  breath sounds normal. She has no wheezes. She has no rales.  Abdominal: Soft. Bowel sounds are normal. She exhibits no distension and no mass. There is no tenderness. There is no rebound and no guarding.  Well healing surgical incision site  Musculoskeletal: Normal range of motion. She exhibits no edema.  Lymphadenopathy:    She has no cervical adenopathy.  Neurological: She is alert and oriented to person, place, and time. She has normal reflexes. No cranial nerve deficit. Gait normal. Coordination normal.  Skin: Skin is warm and dry. No rash noted.  Psychiatric: Mood, memory, affect and judgment normal.  Nursing note and vitals reviewed.   LABORATORY DATA:  CBC    Component Value Date/Time   WBC 8.6 09/15/2014 1324   RBC 3.73* 09/15/2014 1324   HGB 10.9* 09/15/2014 1324   HCT 32.3* 09/15/2014 1324   PLT 399 09/15/2014 1324   MCV 86.6 09/15/2014 1324   MCH 29.2 09/15/2014 1324   MCHC 33.7 09/15/2014 1324   RDW 15.2 09/15/2014 1324   LYMPHSABS 1.5 09/15/2014 1324   MONOABS 0.7 09/15/2014 1324   EOSABS 0.4 09/15/2014 1324   BASOSABS 0.0 09/15/2014 1324   CMP     Component Value Date/Time   NA 138 09/15/2014 1324   K 3.8 09/15/2014 1324   CL 102 09/15/2014 1324   CO2 28 09/15/2014 1324   GLUCOSE 103* 09/15/2014 1324   BUN 9 09/15/2014 1324   CREATININE 0.80 09/15/2014 1324   CALCIUM 9.9 09/15/2014 1324   PROT 7.1 09/15/2014 1324   ALBUMIN 3.9 09/15/2014 1324   AST 20 09/15/2014 1324   ALT 16 09/15/2014 1324   ALKPHOS 73 09/15/2014 1324   BILITOT 0.5 09/15/2014 1324   GFRNONAA 81* 09/15/2014 1324   GFRAA >90 09/15/2014 1324     PENDING LABS:   RADIOGRAPHIC STUDIES:  Comparison Scans 06/03/2014 08/27/2014 EXAM CT ABDOMEN AND PELVIS WITH CONTRAST Liver: Multiple hepatic cystic lesions appears similar to the prior exam. Fatty infiltration at the falciform ligament of the liver. No evidence of hepatic metastatic disease by CT. IMPRESSION: 1. Progression of  cecal mass, compatible with colon cancer. There is extension of tumor from the cecum to the proximal transverse colon which is now adherent to the cecal mass. Calcified ileocecal lymph node is present and there is now LEFT (contralateral) retroperitoneal spread of disease. 2. New high grade partial small bowel obstruction associated with the cecal mass. Surgical diversion should be considered based on the small bowel obstruction. 3. RIGHT lower quadrant peritoneal fat stranding consistent with peritoneal carcinomatosis. 4. New pulmonary nodules compatible with hematogenous metastases. 5. The appendix is fluid-filled and mildly dilated and enhancing, secondary to obstruction of the appendiceal orifice at the cecal mass. Primary acute appendicitis is unlikely and chronic antibiotics may have suppressed acute appendicitis associated with appendiceal obstruction. 6. These results will be called to the ordering  clinician or representative by the Radiologist Assistant, and communication documented in the PACS or zVision Dashboard.  Electronically Signed  By: Dereck Ligas M.D.  On: 08/27/2014 17:40  EXAM: CT CHEST WITHOUT CONTRAST IMPRESSION: 1. Multiple pulmonary nodules consistent with metastatic disease. 2. No acute findings. No other evidence of metastatic disease to the chest. 3. Small right pleural effusion.  Electronically Signed  By: [name] M.D.  On: 08/29/2014 08:45   PATHOLOGY:  Diagnosis 1. Colon, segmental resection for tumor, right - INVASIVE MODERATELY DIFFERENTIATED COLONIC ADENOCARCINOMA, INVADING THROUGH THE MUSCULARIS PROPRIA INTO PERICOLONIC FATTY TISSUE, INVOLVING THE SEROSA, AND ADHERENT TO THE DISTAL COLON SEGMENT.. - A SEPARATE TUBULAR ADENOMA WITH NO EVIDENCE OF HIGH GRADE DYSPLASIA OR MALIGNANCY. - THREE OF FOURTEEN LYMPH NODES, POSITIVE FOR METASTATIC CARCINOMA (3/14). - APPENDIX: FIBROUS OBLITERANS, NO EVIDENCE OF ACTIVE INFLAMMATION  OR MALIGNANCY. - RESECTION MARGINS, NEGATIVE FOR ATYPIA OR MALIGNANCY. 2. Liver, biopsy - LIVER PARENCHYMA, NO EVIDENCE OF METASTATIC CARCINOMA.   ASSESSMENT and THERAPY PLAN:    Cecal cancer  59 year old female who presented with small bowel obstruction, she underwent surgical therapy consisting of a right colon resection and liver biopsy with Dr. Arnoldo Morale on 09/01/2014. Preoperative CEA was not elevated. CT imaging shows multiple pulmonary nodules. She presented back initially in September with abdominal pain and was treated for potential diverticulitis. She had an abnormal CT of the abdomen in the ER, and was referred to GI. She reports she was feeling better and canceled her GI appointment.  She is now been diagnosed with colorectal cancer. Imaging studies are suggestive of stage IV disease. Becker discussed with Martha Becker would recommend additional imaging with PET. Based upon the results of PET imaging we will refer her for additional biopsy. She has a difficult time understanding how she could have cancer. She also has a difficult time accepting that it may be advanced. Becker spent a good amount of time addressing these issues with her. We will obtain laboratory studies today including CBC, iron studies and repeat CMP. Becker will see her back post PET.     Hypothyroidism She reports a history of hypothyroidism but does not take medication as she fears the side effects. Becker will recheck her TSH today and address this at her follow-up as well.          All questions were answered. The patient knows to call the clinic with any problems, questions or concerns. We can certainly see the patient much sooner if necessary.  Molli Hazard 09/28/2014

## 2014-09-15 ENCOUNTER — Encounter (HOSPITAL_COMMUNITY): Payer: Self-pay | Admitting: Hematology & Oncology

## 2014-09-15 ENCOUNTER — Encounter (HOSPITAL_COMMUNITY): Payer: 59 | Attending: Hematology & Oncology | Admitting: Hematology & Oncology

## 2014-09-15 ENCOUNTER — Encounter (HOSPITAL_COMMUNITY): Payer: 59

## 2014-09-15 VITALS — BP 123/91 | HR 93 | Temp 97.7°F | Resp 20 | Ht 66.0 in | Wt 142.0 lb

## 2014-09-15 DIAGNOSIS — E039 Hypothyroidism, unspecified: Secondary | ICD-10-CM | POA: Insufficient documentation

## 2014-09-15 DIAGNOSIS — E038 Other specified hypothyroidism: Secondary | ICD-10-CM

## 2014-09-15 DIAGNOSIS — Z9049 Acquired absence of other specified parts of digestive tract: Secondary | ICD-10-CM | POA: Diagnosis not present

## 2014-09-15 DIAGNOSIS — Z87891 Personal history of nicotine dependence: Secondary | ICD-10-CM | POA: Diagnosis not present

## 2014-09-15 DIAGNOSIS — C19 Malignant neoplasm of rectosigmoid junction: Secondary | ICD-10-CM | POA: Insufficient documentation

## 2014-09-15 DIAGNOSIS — C779 Secondary and unspecified malignant neoplasm of lymph node, unspecified: Secondary | ICD-10-CM | POA: Diagnosis not present

## 2014-09-15 DIAGNOSIS — C786 Secondary malignant neoplasm of retroperitoneum and peritoneum: Secondary | ICD-10-CM | POA: Diagnosis not present

## 2014-09-15 DIAGNOSIS — C7800 Secondary malignant neoplasm of unspecified lung: Secondary | ICD-10-CM | POA: Diagnosis not present

## 2014-09-15 DIAGNOSIS — R300 Dysuria: Secondary | ICD-10-CM

## 2014-09-15 DIAGNOSIS — R911 Solitary pulmonary nodule: Secondary | ICD-10-CM

## 2014-09-15 DIAGNOSIS — C180 Malignant neoplasm of cecum: Secondary | ICD-10-CM | POA: Insufficient documentation

## 2014-09-15 LAB — URINALYSIS, DIPSTICK ONLY
Bilirubin Urine: NEGATIVE
GLUCOSE, UA: NEGATIVE mg/dL
HGB URINE DIPSTICK: NEGATIVE
Ketones, ur: NEGATIVE mg/dL
LEUKOCYTES UA: NEGATIVE
Nitrite: NEGATIVE
Protein, ur: NEGATIVE mg/dL
Specific Gravity, Urine: 1.01 (ref 1.005–1.030)
Urobilinogen, UA: 0.2 mg/dL (ref 0.0–1.0)
pH: 6.5 (ref 5.0–8.0)

## 2014-09-15 LAB — COMPREHENSIVE METABOLIC PANEL
ALT: 16 U/L (ref 0–35)
ANION GAP: 8 (ref 5–15)
AST: 20 U/L (ref 0–37)
Albumin: 3.9 g/dL (ref 3.5–5.2)
Alkaline Phosphatase: 73 U/L (ref 39–117)
BUN: 9 mg/dL (ref 6–23)
CALCIUM: 9.9 mg/dL (ref 8.4–10.5)
CO2: 28 mmol/L (ref 19–32)
Chloride: 102 mEq/L (ref 96–112)
Creatinine, Ser: 0.8 mg/dL (ref 0.50–1.10)
GFR calc Af Amer: >90 mL/min (ref >90)
GFR, EST NON AFRICAN AMERICAN: 81 mL/min — AB (ref >90)
Glucose, Bld: 103 mg/dL — ABNORMAL HIGH (ref 70–99)
Potassium: 3.8 mmol/L (ref 3.5–5.1)
Sodium: 138 mmol/L (ref 135–145)
Total Bilirubin: 0.5 mg/dL (ref 0.3–1.2)
Total Protein: 7.1 g/dL (ref 6.0–8.3)

## 2014-09-15 LAB — CBC WITH DIFFERENTIAL/PLATELET
BASOS PCT: 1 % (ref 0–1)
Basophils Absolute: 0 10*3/uL (ref 0.0–0.1)
EOS ABS: 0.4 10*3/uL (ref 0.0–0.7)
EOS PCT: 4 % (ref 0–5)
HCT: 32.3 % — ABNORMAL LOW (ref 36.0–46.0)
Hemoglobin: 10.9 g/dL — ABNORMAL LOW (ref 12.0–15.0)
LYMPHS ABS: 1.5 10*3/uL (ref 0.7–4.0)
Lymphocytes Relative: 17 % (ref 12–46)
MCH: 29.2 pg (ref 26.0–34.0)
MCHC: 33.7 g/dL (ref 30.0–36.0)
MCV: 86.6 fL (ref 78.0–100.0)
MONO ABS: 0.7 10*3/uL (ref 0.1–1.0)
MONOS PCT: 8 % (ref 3–12)
NEUTROS PCT: 70 % (ref 43–77)
Neutro Abs: 6 10*3/uL (ref 1.7–7.7)
PLATELETS: 399 10*3/uL (ref 150–400)
RBC: 3.73 MIL/uL — ABNORMAL LOW (ref 3.87–5.11)
RDW: 15.2 % (ref 11.5–15.5)
WBC: 8.6 10*3/uL (ref 4.0–10.5)

## 2014-09-15 LAB — TSH: TSH: 7.553 u[IU]/mL — AB (ref 0.350–4.500)

## 2014-09-15 LAB — IRON AND TIBC
IRON: 36 ug/dL — AB (ref 42–145)
Saturation Ratios: 11 % — ABNORMAL LOW (ref 20–55)
TIBC: 321 ug/dL (ref 250–470)
UIBC: 285 ug/dL (ref 125–400)

## 2014-09-15 LAB — FERRITIN: Ferritin: 28 ng/mL (ref 10–291)

## 2014-09-15 NOTE — Patient Instructions (Addendum)
[facility] Discharge Instructions  RECOMMENDATIONS MADE BY THE CONSULTANT AND ANY TEST RESULTS WILL BE SENT TO YOUR REFERRING PHYSICIAN.  EXAM FINDINGS BY THE PHYSICIAN TODAY AND SIGNS OR SYMPTOMS TO REPORT TO CLINIC OR PRIMARY PHYSICIAN:    Your diagnosis: Adenocarcinoma of Colon (cancer). This is the most common type of colon cancer. It was in some of the lymph nodes that Dr. Arnoldo Morale removed. This is at least a Stage III colon cancer because it was in the lymph nodes.  Surgery alone the cure rate is 50%.   Chemotherapy with FOLFOX (Oxaliplatin, Leucovorin, 5FU) increases your chances of cure from this cancer to 85%. With chemotherapy, it is less likely that this cancer will reoccur.   There were 14 lymph nodes removed from your colonic area during surgery. 3 out of the 14 lymph nodes were positive for cancer. It was determined that there was cancer in these 3 lymph nodes. The staging of a cancer is based on 3 variables "T"umor, "L"ymph nodes, and "M"etastasis - did it spread/go anywhere else.   The Next Step for You: We are going to have you do a PET scan to look @ the pulmonary nodules a little closer. Prior to the PET Scan you will not need to eat or drink 6 hours prior to the test. No gum, hard candy, or sugarfree candy 6 hours prior to the test either. This test will take place @ [facility].    We are also giving you some additional reading materials/education on Adenocarcinoma of the Colon.  National Guidelines for Cancer Treatment are called NCCN guidelines. That national standard of care for STAGE III colon cancer is FOLFOX (oxaliplatin, leucovorin, 5FU) x 12 treatments. This is given on 1 day every 14 days. This is approximately 6 months of treatment.   The goal is for you to go through chemotherapy with minimal side effects. Patients mostly go through this chemotherapy regimen with little problems/side effects.  We are checking your  urine today to see if you have any infection. If you do, we will call you and call in an antibiotic for you.  Return to see Dr. Whitney Muse after the PET scan to schedule the next steps.   Please call [facility] Nurse Navigator @ [place] @ [phone] for any questions.    Thank you for choosing [facility] to provide your oncology and hematology care.  To afford each patient quality time with our providers, please arrive at least 15 minutes before your scheduled appointment time.  With your help, our goal is to use those 15 minutes to complete the necessary work-up to ensure our physicians have the information they need to help with your evaluation and healthcare recommendations.    Effective January 1st, 2014, we ask that you re-schedule your appointment with our physicians should you arrive 10 or more minutes late for your appointment.  We strive to give you quality time with our providers, and arriving late affects you and other patients whose appointments are after yours.    Again, thank you for choosing [facility].  Our hope is that these requests will decrease the amount of time that you wait before being seen by our physicians.       _____________________________________________________________  Should you have questions after your visit to [facility], please contact our office at [phone] between the hours of 8:30 a.m. and 4:30 p.m.  Voicemails left after 4:30 p.m.  will not be returned until the following business day.  For prescription refill requests, have your pharmacy contact our office with your prescription refill request.    _______________________________________________________________  We hope that we have given you very good care.  You may receive a patient satisfaction survey in the mail, please complete it and return it as soon as possible.  We value your  feedback!  _______________________________________________________________  Have you asked about our STAR program?  STAR stands for Survivorship Training and Rehabilitation, and this is a nationally recognized cancer care program that focuses on survivorship and rehabilitation.  Cancer and cancer treatments may cause problems, such as, pain, making you feel tired and keeping you from doing the things that you need or want to do. Cancer rehabilitation can help. Our goal is to reduce these troubling effects and help you have the best quality of life possible.  You may receive a survey from a nurse that asks questions about your current state of health.  Based on the survey results, all eligible patients will be referred to the [facility] program for an evaluation so we can better serve you!  A frequently asked questions sheet is available upon request.  Positron Emission Tomography (PET Scan) PET stands for positron emission tomography. This is a test similar to an X-ray. Pictures can be taken of a body part after injection of a very small dose of a chemical called a radionuclide. This is combined with sugar, water, or ammonia to give off tiny particles called positrons. The positrons emitted are like small bursts of energy that can be detected by a scanner. They are processed by a computer to create images. These images can be used to study different diseases. They are often used to study cancer and cancer therapy. A scan of the entire body can be done and used to study all its parts. Because this test is tagged to a sugar used by cells, the bursts of energy show up differently in cells that use sugar faster. The computer is able to produce a color-coded picture based on this. The colors and amount of brightness on a PET image show different levels of tissue or organ function. For example, a cancer grows faster than healthy tissue and uses more sugar than normal tissue. It will absorb more of the substance  injected. This causes it to appear brighter than normal tissue on the PET image. A specialist will read and explain the images. Other examinations, such as recent CT (or CAT) scans or MRI scans may help with interpretation and should be brought along. There are usually no restrictions after the test. You should drink plenty of fluids to flush the radioactive substance from your body. BEFORE THE PROCEDURE   PET is usually an outpatient procedure. Wear comfortable, loose-fitting clothes.  Do not eat for four hours before the scan. You will be encouraged to drink water.  Your caregiver will instruct you regarding the use of medications before the test.  Note: Diabetic patients should ask for any specific diet guidelines to control glucose (sugar) levels during the day of the test. There are limitations with the test if your blood sugar is not controlled during or before the test.  Be on time because of the rapid decay of the radioactive material that must be injected. PROCEDURE  Before the procedure begins a small amount of harmless radioactive material will be injected into a vein. This means you will have a needle stick. It will take from 30 minutes to  one hour for the material to travel around your body in preparation for the scan. You will lie on a cushioned table and be moved through the center of a machine that looks like a large doughnut. This is the machine that detects the positrons. It is connected to a computer that produces images that can be viewed on a monitor. This will take about 30 minutes to an hour, during which you must remain still. Let your caregiver know if this will be difficult for you. Also, let your caregiver know if you need a sedative or help dealing with claustrophobia (feeling uncomfortable in enclosed spaces). HOME CARE INSTRUCTIONS   For the protection of your privacy, test results can not be given over the phone. Make sure you receive the results of your test. Ask as  to how these results are to be obtained if you have not been informed. It is your responsibility to obtain your test results.  Drink several 8-once glasses of water following the test to flush the small amount of radioactive material out of your body.  Keep your follow-up appointments. Document Released: 03/04/2003 Document Revised: 11/20/2011 Document Reviewed: 12/10/2013 [facility] Patient Information 2015 [place], [place]. This information is not intended to replace advice given to you by your health care provider. Make sure you discuss any questions you have with your health care provider. Colorectal Cancer Colorectal cancer is an abnormal growth of tissue (tumor) in the colon or rectum that is cancerous (malignant). Unlike noncancerous (benign) tumors, malignant tumors can spread to other parts of your body. The colon is the large bowel or large intestine. The rectum is the last several inches of the colon.  RISK FACTORS The exact cause of colorectal cancer is unknown. However, the following factors may increase your chances of getting colorectal cancer:   Age older than 26 years.   Abnormal growths (polyps) on the inner wall of the colon or rectum.   Diabetes.   African American race.   Family history of hereditary nonpolyposis colorectal cancer. This condition is caused by changes in the genes that are responsible for repairing mismatched DNA.   Personal history of cancer. A person who has already had colorectal cancer may develop it a second time. Also, women with a history of ovarian, uterine, or breast cancer are at a somewhat higher risk of developing colorectal cancer.  Certain hereditary conditions.  Eating a diet that is high in fat (especially animal fat) and low in fiber, fruits, and vegetables.  Sedentary lifestyle.  Inflammatory bowel disease, including ulcerative colitis and Crohn's disease.   Smoking.   Excessive alcohol use.  SYMPTOMS Early colorectal  cancer often does not cause symptoms. As the cancer grows, symptoms may include:   Changes in bowel habits.  Diarrhea.   Constipation.   Feeling like the bowel does not empty completely after a bowel movement.   Blood in the stool.   Stools that are narrower than usual.   Abdominal discomfort, pain, bloating, fullness, or cramps.  Frequent gas pain.   Unexplained weight loss.   Constant tiredness.   Nausea and vomiting.  DIAGNOSIS  Your health care provider will ask about your medical history. He or she may also perform a number of procedures, such as:   A physical exam.  A digital rectal exam.  A fecal occult blood test.  A barium enema.  Blood tests.   X-rays.   Imaging tests, such as CT scans or MRIs.   Taking a tissue sample (biopsy)  from your colon or rectum to look for cancer cells.   A sigmoidoscopy to view the inside of the last part of your colon.   A colonoscopy to view the inside of your entire colon.   An endorectal ultrasound to see how deep a rectal tumor has grown and whether the cancer has spread to lymph nodes or other nearby tissues.  Your cancer will be staged to determine its severity and extent. Staging is a careful attempt to find out the size of the tumor, whether the cancer has spread, and if so, to what parts of the body. You may need to have more tests to determine the stage of your cancer. The test results will help determine what treatment plan is best for you.   Stage 0. The cancer is found only in the innermost lining of the colon or rectum.   Stage I. The cancer has grown into the inner wall of the colon or rectum. The cancer has not yet reached the outer wall of the colon.   Stage II. The cancer extends more deeply into or through the wall of the colon or rectum. It may have invaded nearby tissue, but cancer cells have not spread to the lymph nodes.   Stage III. The cancer has spread to nearby lymph nodes but  not to other parts of the body.   Stage IV. The cancer has spread to other parts of the body, such as the liver or lungs.  Your health care provider may tell you the detailed stage of your cancer, which includes both a number and a letter.  TREATMENT  Depending on the type and stage, colorectal cancer may be treated with surgery, radiation therapy, chemotherapy, targeted therapy, or radiofrequency ablation. Some people have a combination of these therapies. Surgery may be done to remove the polyps from your colon. In early stages, your health care provider may be able to do this during a colonoscopy. In later stages, surgery may be done to remove part of your colon.  HOME CARE INSTRUCTIONS   Take medicines only as directed by your health care provider.   Maintain a healthy diet.   Consider joining a support group. This may help you learn to cope with the stress of having colorectal cancer.   Seek advice to help you manage treatment of side effects.   Keep all follow-up visits as directed by your health care provider.   Inform your cancer specialist if you are admitted to the hospital.  SEEK MEDICAL CARE IF:  Your diarrhea or constipation does not go away.   Your bowel habits change.  You have increased abdominal pain.   You notice new fatigue or weakness.  You lose weight. Document Released: 08/28/2005 Document Revised: 01/12/2014 Document Reviewed: 02/20/2013 [facility] Patient Information 2015 [place], [place]. This information is not intended to replace advice given to you by your health care provider. Make sure you discuss any questions you have with your health care provider. Chemotherapy Many people are apprehensive about chemotherapy due to concerns over uncomfortable side effects. However, managements for side effects have come a long way. Many side effects once associated with chemotherapy can be prevented and/or controlled. WHAT IS CHEMOTHERAPY? Chemotherapy is  the general term for any treatment involving the use of chemical agents. Chemotherapy can be given through a vein, most commonly through an implanted port* or PICC line.* It can also be delivered by mouth (orally) in the form of a pill. The main goal of chemotherapy is  to kill cancer cells and stop them from growing. It can destroy and eliminate cancer cells where the cancer started (primary tumor location) and throughout the body, often far away from the original cancer. It is a treatment that not only targets the original cancer location, but also the entire body (systemic treatment) for full effect and results. Chemotherapy works by destroying cancer cells. Unfortunately, it cannot tell the difference between a cancer cell and some healthy cells. This results in the death of noncancerous cells, such as hair and blood cells. Harm to healthy cells is what causes side effects. These cells usually repair themselves after chemotherapy. Because some drugs work better together rather than alone, 2 or more drugs are often given at the same time. This is called combination chemotherapy. Depending on the type of cancer and how advanced it is, chemotherapy can be used for different goals:  Cure the cancer.  Keep the cancer from spreading.  Slow the cancer's growth.  Kill cancer cells that may have spread to other parts of the body from the original tumor.  Relieve symptoms caused by cancer. You and your caregiver will decide what drug or combination of drugs you will get. Your caregiver will choose the doses, how the drugs will be given, how often, and how long you will get treatment. All of these decisions will depend on the type of cancer, where it is, how big it is, and how it is affecting your normal body functions and overall health. *Implanted port - A device that is implanted under your skin so that medicines may be delivered directly into your blood system. *PICC line (peripherally inserted central  catheter) - A long, slender, flexible tube. This tube is often inserted into a vein, typically in the upper arm. The tip stops in the large central vein that leads to your heart. Document Released: 06/25/2007 Document Revised: 11/20/2011 Document Reviewed: 12/10/2008 [facility] Patient Information 2015 [place], [place]. This information is not intended to replace advice given to you by your health care provider. Make sure you discuss any questions you have with your health care provider.

## 2014-09-16 ENCOUNTER — Other Ambulatory Visit (HOSPITAL_COMMUNITY): Payer: Self-pay | Admitting: Hematology & Oncology

## 2014-09-16 DIAGNOSIS — E038 Other specified hypothyroidism: Secondary | ICD-10-CM

## 2014-09-16 DIAGNOSIS — D509 Iron deficiency anemia, unspecified: Secondary | ICD-10-CM

## 2014-09-16 MED ORDER — POLYSACCHARIDE IRON COMPLEX 150 MG PO CAPS: 150.0000 mg | ORAL_CAPSULE | Freq: Every day | ORAL | Status: DC

## 2014-09-16 MED ORDER — LEVOTHYROXINE SODIUM 25 MCG PO TABS: 25.0000 ug | ORAL_TABLET | Freq: Every day | ORAL | Status: DC

## 2014-09-21 ENCOUNTER — Encounter: Payer: Self-pay | Admitting: Gastroenterology

## 2014-09-21 ENCOUNTER — Telehealth: Payer: Self-pay | Admitting: Gastroenterology

## 2014-09-21 NOTE — Telephone Encounter (Signed)
I checked with Manuela Schwartz on this. She said the paperwork that was received ( Dated 08/28/2014) got mixed up with Tamara's papers and it is now in Dr. Nona Dell chair. Pt is needing this right away, Please advise!

## 2014-09-21 NOTE — Telephone Encounter (Signed)
PATIENT CALLED STATING THAT SHE WAS TOLD THAT HER HR DEPARTMENT FAXED PAPERS HERE FOR HER FMLA AND THEY NEVER GOT ANYTHING BACK FROM [place].  PLEASE ADVISE [phone]   PATIENT NEEDS TO TURN IN TODAY THE INFORMATION THAT SHE NEEDS FOR WORK.

## 2014-09-22 ENCOUNTER — Ambulatory Visit (HOSPITAL_COMMUNITY)
Admission: RE | Admit: 2014-09-22 | Discharge: 2014-09-22 | Disposition: A | Discharge status: Home / Self Care | Payer: 59 | Source: Ambulatory Visit | Attending: Hematology & Oncology | Admitting: Hematology & Oncology

## 2014-09-22 ENCOUNTER — Encounter (HOSPITAL_COMMUNITY): Payer: Self-pay

## 2014-09-22 DIAGNOSIS — C180 Malignant neoplasm of cecum: Secondary | ICD-10-CM | POA: Diagnosis present

## 2014-09-22 DIAGNOSIS — C7800 Secondary malignant neoplasm of unspecified lung: Secondary | ICD-10-CM | POA: Insufficient documentation

## 2014-09-22 DIAGNOSIS — R300 Dysuria: Secondary | ICD-10-CM

## 2014-09-22 LAB — GLUCOSE, CAPILLARY: Glucose-Capillary: 84 mg/dL (ref 70–99)

## 2014-09-22 MED ORDER — FLUDEOXYGLUCOSE F - 18 (FDG) INJECTION
7.9400 | Freq: Once | INTRAVENOUS | Status: AC | PRN
  Administered 2014-09-22: 7.94 via INTRAVENOUS

## 2014-09-22 NOTE — Telephone Encounter (Signed)
Left the message on VM the full message.

## 2014-09-22 NOTE — Telephone Encounter (Signed)
PLEASE CALL PT. SHE WILL NEED TO PAY $29 BEFORE PAPERWORK IS FAXED. IT WILL BE COMPLETE TOMORROW BY [place]B.

## 2014-09-22 NOTE — Telephone Encounter (Signed)
PLEASE CALL PT. i will complete the PAPERWORK THROUGH  MAR 2016, BUT ONCOLOGY WILL HAVE TO RENEW IT.

## 2014-09-22 NOTE — Telephone Encounter (Signed)
I called pt to inform her the paperwork will be faxed tomorrow by the close of the day, she just needs to get the $29.00 here before then. She said she does have an appt in [place] tomorrow and she will bring the money tomorrow.  She said to tell Dr. Oneida Alar that the date should begin on 08/26/2014. She will be beginning chemo and if the date could say thru 3 months such as 11/25/2014 and then if she has to renew it she can.

## 2014-09-23 ENCOUNTER — Telehealth: Payer: Self-pay | Admitting: Gastroenterology

## 2014-09-23 ENCOUNTER — Encounter (HOSPITAL_BASED_OUTPATIENT_CLINIC_OR_DEPARTMENT_OTHER): Payer: 59 | Admitting: Hematology & Oncology

## 2014-09-23 ENCOUNTER — Encounter (HOSPITAL_COMMUNITY): Payer: Self-pay | Admitting: Hematology & Oncology

## 2014-09-23 DIAGNOSIS — E038 Other specified hypothyroidism: Secondary | ICD-10-CM

## 2014-09-23 DIAGNOSIS — C7802 Secondary malignant neoplasm of left lung: Principal | ICD-10-CM

## 2014-09-23 DIAGNOSIS — R918 Other nonspecific abnormal finding of lung field: Secondary | ICD-10-CM

## 2014-09-23 DIAGNOSIS — C189 Malignant neoplasm of colon, unspecified: Secondary | ICD-10-CM

## 2014-09-23 DIAGNOSIS — C7801 Secondary malignant neoplasm of right lung: Secondary | ICD-10-CM

## 2014-09-23 DIAGNOSIS — R935 Abnormal findings on diagnostic imaging of other abdominal regions, including retroperitoneum: Secondary | ICD-10-CM

## 2014-09-23 DIAGNOSIS — K56609 Unspecified intestinal obstruction, unspecified as to partial versus complete obstruction: Secondary | ICD-10-CM

## 2014-09-23 DIAGNOSIS — C180 Malignant neoplasm of cecum: Secondary | ICD-10-CM

## 2014-09-23 NOTE — Patient Instructions (Signed)
[facility] at [facility]  Discharge Instructions:   Please call with any problems or concerns prior to follow-up I will see you back one week after your biopsy _______________________________________________________________  Thank you for choosing [place] at [facility] to provide your oncology and hematology care.  To afford each patient quality time with our providers, please arrive at least 15 minutes before your scheduled appointment.  You need to re-schedule your appointment if you arrive 10 or more minutes late.  We strive to give you quality time with our providers, and arriving late affects you and other patients whose appointments are after yours.  Also, if you no show three or more times for appointments you may be dismissed from the clinic.  Again, thank you for choosing [place] at [facility] hope is that these requests will allow you access to exceptional care and in a timely manner. _______________________________________________________________  If you have questions after your visit, please contact our office at [phone] between the hours of 8:30 a.m. and 5:00 p.m. Voicemails left after 4:30 p.m. will not be returned until the following business day. _______________________________________________________________  For prescription refill requests, have your pharmacy contact our office. _______________________________________________________________  Recommendations made by the consultant and any test results will be sent to your referring physician. _______________________________________________________________

## 2014-09-23 NOTE — Telephone Encounter (Signed)
FMLA PAPERWORK COMPLETE.

## 2014-09-23 NOTE — Telephone Encounter (Signed)
Manuela Schwartz has paperwork for pt to fax or give to her after the patient comes by and pays the fee today.

## 2014-09-23 NOTE — Telephone Encounter (Signed)
Pt came by to pay for her FMLA forms and wasn't happy with the price. She said that they needed to be faxed to Leron Croak in HR ASAP.  I told her that I was faxing them now as Erline Levine gave her her receipt.  Pt didn't take her return to work slip. I made a copy to be scanned and mailed her the original.

## 2014-09-25 ENCOUNTER — Telehealth: Payer: Self-pay | Admitting: Gastroenterology

## 2014-09-25 ENCOUNTER — Other Ambulatory Visit: Payer: Self-pay | Admitting: Radiology

## 2014-09-25 NOTE — Telephone Encounter (Signed)
Leron Croak with HR at patient's employer called to say she did receive the FMLA paper, but on page 3 she needed SF to change the date to [place] [address] to a specific date in March. Either March 1st or March 31st. Also, she needs a doctor's note saying that she was out from a particular date and can return on a particular date.  Patient has already paid for these forms to be completed and HR are needing them ASAP with corrections. I have laid them back in [facility] office chair.

## 2014-09-25 NOTE — Telephone Encounter (Signed)
Pt is aware and she would like a copy of the note to be faxed to HR also.

## 2014-09-25 NOTE — Telephone Encounter (Signed)
PLEASE CALL PT will make corrections. Pt already had a work note that was in her FMLA paperwork when she picked it up. She should let us know if she needs something else.

## 2014-09-27 NOTE — Progress Notes (Addendum)
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 Lorenda Peck 09/01/2014  Pre-operative CEA 2.2 ng/ml   EXAM: 09/22/2013 NUCLEAR MEDICINE PET SKULL BASE TO THIGH IMPRESSION: Retroperitoneal hypermetabolic nodal metastasis. The left periaortic index nodal mass measures 3.9 x 1.9 cm and a S.U.V. max of 6.9 on image 125. 3.9 x 2.4 cm on 08/27/2014.  1. Status post right hemicolectomy with hypermetabolic pulmonary and abdominal nodal metastasis. The pulmonary metastasis appear slightly progressive since 08/29/2014. 2. Findings suspicious for isolated hepatic metastasis. 3. Thyroid hypermetabolism which is nonspecific but could represent thyroiditis.  CURRENT THERAPY: workup in progress  INTERVAL HISTORY: Martha Becker 59 y.o. female returns for follow-up of newly diagnosed Colon Cancer. She is doing very well after her surgery.  Her brother is with her today.  She is extremely frustrated about her diagnosis. "I do not have time for this." She is recently divorced and is afraid of losing everything she has worked on to get her "life back on track."  She is here today for review of her PET/CT and additional recommendations.   MEDICAL HISTORY: Past Medical History  Diagnosis Date  . Colon cancer     has Abnormal CT scan, colon; RLQ abdominal pain; Colonic obstruction; Nausea with vomiting; Pulmonary nodules; Cecal cancer; Peritoneal carcinomatosis; Lung metastasis; Anemia of chronic disease; SBO (small bowel obstruction); and Hypothyroidism on her problem list.     No history exists.     is allergic to codeine.  Martha Becker does not currently have medications on file.  SURGICAL HISTORY: Past Surgical History  Procedure Laterality Date  . Cesarean section    . Partial colectomy N/A 08/31/2014    Procedure: Right Colon Resection ;  Surgeon: Jamesetta So, Becker;  Location: [facility];  Service: General;   Laterality: N/A;  . Liver biopsy N/A 08/31/2014    Procedure: LIVER BIOPSY;  Surgeon: Jamesetta So, Becker;  Location: [facility];  Service: General;  Laterality: N/A;    SOCIAL HISTORY: History   Social History  . Marital Status: Single    Spouse Name: N/A    Number of Children: 2  . Years of Education: N/A   Occupational History  . Not on file.   Social History Main Topics  . Smoking status: Former Smoker -- 1.50 packs/day for 18 years    Types: Cigarettes    Quit date: 09/15/1994  . Smokeless tobacco: Not on file  . Alcohol Use: 0.6 oz/week    0 Not specified, 1 Glasses of wine per week     Comment: Maybe 1 glass a week  . Drug Use: No  . Sexual Activity: Not on file   Other Topics Concern  . Not on file   Social History Narrative  Had twins, one son had multiple complications at birth  Died at age 37. Works part time for [employer] in [place]. Divorced 6 years ago.   FAMILY HISTORY: Family History  Problem Relation Age of Onset  . Colon cancer Cousin     age late 22s  . Inflammatory bowel disease Neg Hx     Review of Systems  Constitutional: Negative for fever, chills, weight loss and malaise/fatigue.  HENT: Negative for congestion, hearing loss, nosebleeds, sore throat and tinnitus.   Eyes: Negative for blurred vision, double vision, pain and discharge.  Respiratory: Negative for cough, hemoptysis, sputum production, shortness of breath and wheezing.  Cardiovascular: Negative for chest pain, palpitations, claudication, leg swelling and PND.  Gastrointestinal: Negative for heartburn, nausea, vomiting, abdominal pain, diarrhea, constipation, blood in stool and melena.  Genitourinary: Negative for dysuria, urgency, frequency and hematuria.  Musculoskeletal: Negative for myalgias, joint pain and falls.  Skin: Negative for itching and rash.  Neurological: Negative for dizziness, tingling, tremors, sensory change, speech change, focal weakness, seizures, loss of  consciousness, weakness and headaches.  Endo/Heme/Allergies: Does not bruise/bleed easily.  Psychiatric/Behavioral: Negative for depression, suicidal ideas, memory loss and substance abuse. The patient is not nervous/anxious and does not have insomnia.     PHYSICAL EXAMINATION  ECOG PERFORMANCE STATUS: 0 - Asymptomatic  There were no vitals filed for this visit.  Physical Exam  Constitutional: She is oriented to person, place, and time and well-developed, well-nourished, and in no distress.  HENT:  Head: Normocephalic and atraumatic.  Nose: Nose normal.  Mouth/Throat: Oropharynx is clear and moist. No oropharyngeal exudate.  Eyes: Conjunctivae and EOM are normal. Pupils are equal, round, and reactive to light. Right eye exhibits no discharge. Left eye exhibits no discharge. No scleral icterus.  Neck: Normal range of motion. Neck supple. No tracheal deviation present. No thyromegaly present.  Cardiovascular: Normal rate, regular rhythm and normal heart sounds.  Exam reveals no gallop and no friction rub.   No murmur heard. Pulmonary/Chest: Effort normal and breath sounds normal. She has no wheezes. She has no rales.  Abdominal: Soft. Bowel sounds are normal. She exhibits no distension and no mass. There is no tenderness. There is no rebound and no guarding.  Well healing midline surgical incision site  Musculoskeletal: Normal range of motion. She exhibits no edema.  Lymphadenopathy:    She has no cervical adenopathy.  Neurological: She is alert and oriented to person, place, and time. She has normal reflexes. No cranial nerve deficit. Gait normal. Coordination normal.  Skin: Skin is warm and dry. No rash noted.  Psychiatric: Mood, memory, affect and judgment normal.  Nursing note and vitals reviewed.   LABORATORY DATA:  CBC    Component Value Date/Time   WBC 8.6 09/15/2014 1324   RBC 3.73* 09/15/2014 1324   HGB 10.9* 09/15/2014 1324   HCT 32.3* 09/15/2014 1324   PLT 399  09/15/2014 1324   MCV 86.6 09/15/2014 1324   MCH 29.2 09/15/2014 1324   MCHC 33.7 09/15/2014 1324   RDW 15.2 09/15/2014 1324   LYMPHSABS 1.5 09/15/2014 1324   MONOABS 0.7 09/15/2014 1324   EOSABS 0.4 09/15/2014 1324   BASOSABS 0.0 09/15/2014 1324   CMP     Component Value Date/Time   NA 138 09/15/2014 1324   K 3.8 09/15/2014 1324   CL 102 09/15/2014 1324   CO2 28 09/15/2014 1324   GLUCOSE 103* 09/15/2014 1324   BUN 9 09/15/2014 1324   CREATININE 0.80 09/15/2014 1324   CALCIUM 9.9 09/15/2014 1324   PROT 7.1 09/15/2014 1324   ALBUMIN 3.9 09/15/2014 1324   AST 20 09/15/2014 1324   ALT 16 09/15/2014 1324   ALKPHOS 73 09/15/2014 1324   BILITOT 0.5 09/15/2014 1324   GFRNONAA 81* 09/15/2014 1324   GFRAA >90 09/15/2014 1324     PENDING LABS:   RADIOGRAPHIC STUDIES:  Comparison Scans 06/03/2014 08/27/2014 EXAM CT ABDOMEN AND PELVIS WITH CONTRAST Liver: Multiple hepatic cystic lesions appears similar to the prior exam. Fatty infiltration at the falciform ligament of the liver. No evidence of hepatic metastatic disease by CT. IMPRESSION: 1. Progression of cecal mass, compatible  with colon cancer. There is extension of tumor from the cecum to the proximal transverse colon which is now adherent to the cecal mass. Calcified ileocecal lymph node is present and there is now LEFT (contralateral) retroperitoneal spread of disease. 2. New high grade partial small bowel obstruction associated with the cecal mass. Surgical diversion should be considered based on the small bowel obstruction. 3. RIGHT lower quadrant peritoneal fat stranding consistent with peritoneal carcinomatosis. 4. New pulmonary nodules compatible with hematogenous metastases. 5. The appendix is fluid-filled and mildly dilated and enhancing, secondary to obstruction of the appendiceal orifice at the cecal mass. Primary acute appendicitis is unlikely and chronic antibiotics may have suppressed acute appendicitis  associated with appendiceal obstruction. 6. These results will be called to the ordering clinician or representative by the Radiologist Assistant, and communication documented in the PACS or zVision Dashboard.  Electronically Signed  By: Dereck Ligas M.D.  On: 08/27/2014 17:40  EXAM: CT CHEST WITHOUT CONTRAST IMPRESSION: 1. Multiple pulmonary nodules consistent with metastatic disease. 2. No acute findings. No other evidence of metastatic disease to the chest. 3. Small right pleural effusion.  Electronically Signed  By: [name] M.D.  On: 08/29/2014 08:45   PATHOLOGY:  Diagnosis 1. Colon, segmental resection for tumor, right - INVASIVE MODERATELY DIFFERENTIATED COLONIC ADENOCARCINOMA, INVADING THROUGH THE MUSCULARIS PROPRIA INTO PERICOLONIC FATTY TISSUE, INVOLVING THE SEROSA, AND ADHERENT TO THE DISTAL COLON SEGMENT.. - A SEPARATE TUBULAR ADENOMA WITH NO EVIDENCE OF HIGH GRADE DYSPLASIA OR MALIGNANCY. - THREE OF FOURTEEN LYMPH NODES, POSITIVE FOR METASTATIC CARCINOMA (3/14). - APPENDIX: FIBROUS OBLITERANS, NO EVIDENCE OF ACTIVE INFLAMMATION OR MALIGNANCY. - RESECTION MARGINS, NEGATIVE FOR ATYPIA OR MALIGNANCY. 2. Liver, biopsy - LIVER PARENCHYMA, NO EVIDENCE OF METASTATIC CARCINOMA.  Mismatch Repair (MMR) Protein Immunohistochemistry (IHC)  Interpretation: NORMAL There is preserved expression of the major and minor MMR proteins. There is a very low probability that microsatellite instability (MSI) is present.   ASSESSMENT and THERAPY PLAN:    Cecal cancer 59 year old female who presented with small bowel obstruction, she underwent surgical therapy consisting of a right colon resection and liver biopsy with Dr. Arnoldo Morale on 09/01/2014. Preoperative CEA was not elevated. PET scan was reviewed with the patient in detail. I expressed my concerns that she may have stage IV disease based upon her current imaging studies. I advised Anthea that we must biopsy and  histologically confirm our suspicion. I have provided her with the NCCN guidelines for patients., She did read them and I answered her questions today.  She would like to have every suspicious area biopsied. I discussed with Phillis in detail but that is not how we typically proceed. That whether we obtain a positive biopsy in the lung or in the retroperitoneal mass, she has stage IV disease.   She was very frustrated that the radiologist did not come out and tell her the results of her imaging after it was complete. I discussed with her that this is also not how things are done. It is in her best interest to review the results of her scans with her medical oncologist.  We will set her up for a biopsy in interventional radiology. I have also recommended referring her to an outside institution for additional input.      Hypothyroidism I reviewed the results of her TSH today and advised her that I agree with her primary physician, she would benefit from thyroid replacement. In addition, her PET imaging suggests thyroiditis. She states the side effects of Synthroid scare her  more than hypothyroidism. I spent a long time discussing with Sonny untreated hypothyroidism and the risks that it presents. She is currently unwilling to take any other medication.     All questions were answered. The patient knows to call the clinic with any problems, questions or concerns. We can certainly see the patient much sooner if necessary.  Molli Hazard 09/28/2014

## 2014-09-28 DIAGNOSIS — E039 Hypothyroidism, unspecified: Secondary | ICD-10-CM | POA: Insufficient documentation

## 2014-09-28 NOTE — Assessment & Plan Note (Addendum)
59 year old female who presented with small bowel obstruction, she underwent surgical therapy consisting of a right colon resection and liver biopsy with Dr. Arnoldo Morale on 09/01/2014. Preoperative CEA was not elevated. PET scan was reviewed with the patient in detail. I expressed my concerns that she may have stage IV disease based upon her current imaging studies. I advised Vita that we must biopsy and histologically confirm our suspicion. I have provided her with the NCCN guidelines for patients., She did read them and I answered her questions today.  She would like to have every suspicious area biopsied. I discussed with Phillis in detail but that is not how we typically proceed. That whether we obtain a positive biopsy in the lung or in the retroperitoneal mass, she has stage IV disease.   She was very frustrated that the radiologist did not come out and tell her the results of her imaging after it was complete. I discussed with her that this is also not how things are done. It is in her best interest to review the results of her scans with her medical oncologist.  We will set her up for a biopsy in interventional radiology. I have also recommended referring her to an outside institution for additional input.

## 2014-09-28 NOTE — Telephone Encounter (Signed)
Paper work faxed by Almyra Free on 09/25/2014.

## 2014-09-28 NOTE — Assessment & Plan Note (Signed)
She reports a history of hypothyroidism but does not take medication as she fears the side effects. I will recheck her TSH today and address this at her follow-up as well.

## 2014-09-28 NOTE — Assessment & Plan Note (Addendum)
I reviewed the results of her TSH today and advised her that I agree with her primary physician, she would benefit from thyroid replacement. In addition, her PET imaging suggests thyroiditis. She states the side effects of Synthroid scare her more than hypothyroidism. I spent a long time discussing with Martha Becker untreated hypothyroidism and the risks that it presents. She is currently unwilling to take any other medication.

## 2014-09-28 NOTE — Assessment & Plan Note (Signed)
  60 year old female who presented with small bowel obstruction, she underwent surgical therapy consisting of a right colon resection and liver biopsy with Dr. Arnoldo Morale on 09/01/2014. Preoperative CEA was not elevated. CT imaging shows multiple pulmonary nodules. She presented back initially in September with abdominal pain and was treated for potential diverticulitis. She had an abnormal CT of the abdomen in the ER, and was referred to GI. She reports she was feeling better and canceled her GI appointment.  She is now been diagnosed with colorectal cancer. Imaging studies are suggestive of stage IV disease. I discussed with Martha Becker I would recommend additional imaging with PET. Based upon the results of PET imaging we will refer her for additional biopsy. She has a difficult time understanding how she could have cancer. She also has a difficult time accepting that it may be advanced. I spent a good amount of time addressing these issues with her. We will obtain laboratory studies today including CBC, iron studies and repeat CMP. I will see her back post PET.

## 2014-09-29 ENCOUNTER — Ambulatory Visit (HOSPITAL_COMMUNITY): Admission: RE | Admit: 2014-09-29 | Payer: 59 | Source: Ambulatory Visit

## 2014-09-29 NOTE — Telephone Encounter (Signed)
REVIEWED-NO ADDITIONAL RECOMMENDATIONS. 

## 2014-09-29 NOTE — Telephone Encounter (Signed)
FMLA papers have been completed, paid for, faxed to HR and are being sent to be scanned.

## 2014-10-08 ENCOUNTER — Ambulatory Visit (HOSPITAL_COMMUNITY): Payer: 59 | Admitting: Hematology & Oncology

## 2014-10-13 ENCOUNTER — Telehealth: Payer: Self-pay | Admitting: Oncology

## 2014-10-13 NOTE — Telephone Encounter (Signed)
Gave np referral to Dr. Sherrill for review. °

## 2014-10-14 ENCOUNTER — Telehealth: Payer: Self-pay | Admitting: *Deleted

## 2014-10-14 NOTE — Telephone Encounter (Signed)
Left VM for patient to return call and request GI Navigator.

## 2014-10-15 ENCOUNTER — Other Ambulatory Visit: Payer: Self-pay | Admitting: *Deleted

## 2014-10-15 ENCOUNTER — Telehealth: Payer: Self-pay | Admitting: Oncology

## 2014-10-15 NOTE — Telephone Encounter (Signed)
Delivered chart

## 2014-10-19 ENCOUNTER — Telehealth: Payer: Self-pay | Admitting: *Deleted

## 2014-10-19 NOTE — Telephone Encounter (Signed)
Left VM to confirm her appointment with Dr. Benay Spice tomorrow at 2 pm.

## 2014-10-20 ENCOUNTER — Encounter: Payer: Self-pay | Admitting: Oncology

## 2014-10-20 ENCOUNTER — Ambulatory Visit (HOSPITAL_BASED_OUTPATIENT_CLINIC_OR_DEPARTMENT_OTHER): Payer: 59 | Admitting: Oncology

## 2014-10-20 ENCOUNTER — Ambulatory Visit: Payer: 59

## 2014-10-20 ENCOUNTER — Telehealth: Payer: Self-pay | Admitting: Oncology

## 2014-10-20 VITALS — BP 129/75 | HR 69 | Temp 98.4°F | Resp 18 | Ht 66.0 in | Wt 143.1 lb

## 2014-10-20 DIAGNOSIS — C7802 Secondary malignant neoplasm of left lung: Secondary | ICD-10-CM

## 2014-10-20 DIAGNOSIS — C7801 Secondary malignant neoplasm of right lung: Secondary | ICD-10-CM

## 2014-10-20 DIAGNOSIS — Z809 Family history of malignant neoplasm, unspecified: Secondary | ICD-10-CM

## 2014-10-20 DIAGNOSIS — Z800 Family history of malignant neoplasm of digestive organs: Secondary | ICD-10-CM

## 2014-10-20 DIAGNOSIS — C180 Malignant neoplasm of cecum: Secondary | ICD-10-CM

## 2014-10-20 DIAGNOSIS — C772 Secondary and unspecified malignant neoplasm of intra-abdominal lymph nodes: Secondary | ICD-10-CM

## 2014-10-20 NOTE — Telephone Encounter (Signed)
per pof to sch pt appt-cld [facility] office to get appt for poracath-left pt demo-adv to c/b with pt appt

## 2014-10-20 NOTE — Progress Notes (Signed)
[facility] Patient Consult   Referring MD: [facility]  Martha Becker 59 y.o.  01-25-1956    Reason for Referral: Colon cancer   HPI: Martha Becker reports intermittent right lower abdomen/pelvic pain beginning in June 2015. The pain initially resolved after a course of antibiotics. The pain returned in September 2015 and she was seen in the emergency room. A CT revealed too small to characterize liver lesions felt to most likely be cysts. Inflammatory changes were noted at the wall of the cecum and proximal ascending colon with adjacent mesenteric edema and borderline enlarged lymph nodes in the ileocolic station.  She was treated with antibiotics and referred to gastroenterology and a colonoscopy was scheduled. Prior to the colonoscopy she was referred for a CT of the abdomen and pelvis 08/27/2014. Compared to 06/03/2014 new nodules were noted at the lung bases compatible with metastatic disease. Multiple hepatic cysts and fatty infiltration at the thousand former ligament. Left retroperitoneal lymphadenopathy. Small bowel obstruction with fecal is a patient of the distal small bowel contents. A cecal mass was noted with extension of tumor from the cecum to the proximal transverse colon. Calcified ileocecal lymph node. There was evidence of peritoneal carcinomatosis in the right lower quadrant.  A CT of the chest 08/29/2014 confirmed multiple pulmonary nodules consistent with metastatic disease. Small right pleural effusion.  She was referred to Dr. Arnoldo Morale and was taken the operating room 08/31/2014 for a right hemicolectomy and liver biopsy. Minute nodules were noted in the right lobe of the liver. One nodule close to the falciform ligament was biopsied. A large mass was noted at the cecum with multiple palpable lymph nodes. The pathology ([id number]) confirmed an invasive moderately differentiated adenocarcinoma. Tumor invaded through the muscular propria into pericolic fat  involving the serosa and adherent to a distal colon segment. A separate tubular adenoma with no high-grade dysplasia or malignancy. 3 of 14 lymph nodes were positive for metastatic carcinoma. The resection margins are negative. The liver biopsy returned negative for metastatic carcinoma. No macroscopic tumor perforation. Lymphovascular and perineural invasion were not identified. One tumor deposit. An acute abscess was noted in the pericolonic fatty tissue. Mismatch repair protein expression is preserved. The tumor returned microsatellite stable.  Martha Becker has been seen was seen in consultation by Dr. Whitney Muse. She was referred for a staging PET scan on 09/22/2014. This revealed hypermetabolic pulmonary metastases, an area of hypermetabolism in the lateral segment of the left liver with no CT correlate. Hypermetabolic retroperitoneal nodal metastases and hypermetabolic nodes in the transverse and ileocolic mesentery were noted. Thyroid hypermetabolism was also noted.  She was seen by Dr. Fanny Skates for a second opinion on 10/08/2014. Dr. Fanny Skates explained she has incurable metastatic colon cancer. She recommended FOLFOX/Avastin or CAPOX/Avastin. Martha Becker decided she would like to have chemotherapy in [place].     Past Medical History  Diagnosis Date  . Colon cancer-stage IV   December 2015     .   G2 P1, 1 abortion, 1 set of 20    .   Elevated TSH  Past Surgical History  Procedure Laterality Date  . Cesarean section    . Partial colectomy N/A 08/31/2014    Procedure: Right Colon Resection ;  Surgeon: Jamesetta So, MD;  Location: [facility];  Service: General;  Laterality: N/A;  . Liver biopsy N/A 08/31/2014    Procedure: LIVER BIOPSY;  Surgeon: Jamesetta So, MD;  Location: [facility];  Service: General;  Laterality: N/A;  Medications: Reviewed  Allergies:  Allergies  Allergen Reactions  . Codeine Nausea And Vomiting    Family history: Her paternal grandfather had colon cancer and died at  age 42. A paternal first cousin had rectal cancer. Her maternal uncle had "leukemia ". A maternal on had lymphoma.   Social History:   She lives in [place]. She lives alone. 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 She quit smoking cigarettes in her 60s. She reports social alcohol use. No transfusion history. No risk factor for HIV or hepatitis.   History  Alcohol Use  . 0.6 oz/week  . 0 Not specified, 1 Glasses of wine per week    Comment: Maybe 1 glass a week    History  Smoking status  . Former Smoker -- 1.50 packs/day for 18 years  . Types: Cigarettes  . Quit date: 09/15/1994  Smokeless tobacco  . Not on file      ROS:   Positives include:Intermittent right low abdomen and pelvic pain prior to surgery, intermittent low back pain following surgery, "hip "pain for the past week   A complete ROS was otherwise negative.  Physical Exam:  Blood pressure 129/75, pulse 69, temperature 98.4 F (36.9 C), temperature source Oral, resp. rate 18, height 5' 6"  (1.676 m), weight 143 lb 1.6 oz (64.91 kg), SpO2 100 %.  HEENT: Oropharynx without visible mass, neck without mass Lungs: End inspiratory rhonchi at the posterior bases bilaterally, no respiratory distress  Cardiac: Regular rate and rhythm GI: The abdomen is soft, healed surgical incision, no hepatosplenomegaly, nontender, no mass  Vascular: No leg edema Lymph nodes: No cervical, supra-clavicular, axillary, or inguinal nodes  Neurologic: Alert and oriented, the motor exam appears intact in the upper and lower extremities skin: No rash Musculoskeletal: No spine tenderness   LAB:  CBC  Lab Results  Component Value Date   WBC 8.6 09/15/2014   HGB 10.9* 09/15/2014   HCT 32.3* 09/15/2014   MCV 86.6 09/15/2014   PLT 399 09/15/2014   NEUTROABS 6.0 09/15/2014     CMP      Component Value Date/Time   NA 138 09/15/2014 1324   K 3.8 09/15/2014 1324   CL 102 09/15/2014 1324   CO2 28  09/15/2014 1324   GLUCOSE 103* 09/15/2014 1324   BUN 9 09/15/2014 1324   CREATININE 0.80 09/15/2014 1324   CALCIUM 9.9 09/15/2014 1324   PROT 7.1 09/15/2014 1324   ALBUMIN 3.9 09/15/2014 1324   AST 20 09/15/2014 1324   ALT 16 09/15/2014 1324   ALKPHOS 73 09/15/2014 1324   BILITOT 0.5 09/15/2014 1324   GFRNONAA 81* 09/15/2014 1324   GFRAA >90 09/15/2014 1324    Lab Results  Component Value Date   CEA 2.2 08/29/2014    Imaging:  As per history of present illness, I reviewed the December 2015 CT images and the 09/22/2014 PET images with her   Assessment/Plan:   1. 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    Elevated TSH, PET scan 09/22/2014 with diffuse increased metabolic uptake in the thyroid   3.    Family history of multiple cancers including colon cancer in her paternal grandfather and rectal cancer  in a paternal first cousin  Disposition:   Martha Becker has been diagnosed with stage IV colon cancer. The imaging studies are consistent with metastatic colon cancer. I do not recommend additional biopsies.   I explained the metastatic colon cancer is incurable, but there is a significant chance of obtaining a partial clinical remission with systemic therapy. We discussed various systemic therapy options including FOLFOX, CAPOX, FOLFIRI, and the [facility] irinotecan dose escalation study. I recommend the addition of Avastin to one of these regimens. She does not appear interested in participating in the clinical trial and 2 oncologists have recommended oxaliplatin-based therapy.  We decided to proceed with 5 fluorouracil and oxaliplatin  chemotherapy. I reviewed the specifics of the FOLFOX and CAPOX regimens. She is most comfortable with CAPOX/Avastin. I reviewed the potential toxicities associated with this regimen including the chance for nausea/vomiting, mucositis, diarrhea, alopecia, and hematologic toxicity. We discussed the various types of neuropathy and allergic reaction associated with oxaliplatin. We reviewed the rash, hyperpigmentation, and hand/foot syndrome seen with capecitabine. She agrees to proceed. She will attend a chemotherapy teaching class. We discussed the allergic reaction, hypertension, bleeding, thromboembolic disease, delayed wound healing, nephrotoxicity, and bowel perforation associated with Avastin.  She will be referred to Dr. Arnoldo Morale for placement of a Port-A-Cath with the plan to begin a first cycle of chemotherapy on 10/29/2014. I will see her prior to cycle 2 on 11/20/2014.   She does not appear to have hereditary non-polyposis colon cancer syndrome. However her family members are at increased risk of developing colon cancer and should receive appropriate screening. We will request K-ras testing on the resected tumor.  Approximately 60 minutes were spent with the patient today. The majority of the time was used for counseling and coordination of care.  [place], [place] 10/20/2014, 5:35 PM

## 2014-10-20 NOTE — Telephone Encounter (Signed)
Gave avs & calendar for February/March. Sent message to schedule treatment. °

## 2014-10-20 NOTE — Progress Notes (Signed)
Met with patient and sister, Martha Becker during new patient visit. Explained role of GI Nurse Navigator and provided new patient packet with information on:  Colorectal cancer  Support groups  Advanced Directive  Fall Safety Plan Answered questions, reviewed current treatment plan, showed her PAC using TEACH back. Provided emotional support. Will refer to dietician and social worker for insight into filing for disability and her stress/anxiety level.

## 2014-10-20 NOTE — Progress Notes (Signed)
Checked in new pt with no financial concerns prior to seeing the dr.  Informed pt that Raquel will get auth from her ins for chemo as well as contact foundations that offer copay assistance for chemo if needed.  She has Raquel's card for any billing questions or concerns.

## 2014-10-21 ENCOUNTER — Telehealth: Payer: Self-pay | Admitting: Oncology

## 2014-10-21 ENCOUNTER — Other Ambulatory Visit: Payer: Self-pay | Admitting: *Deleted

## 2014-10-21 ENCOUNTER — Encounter (HOSPITAL_COMMUNITY)
Admission: RE | Admit: 2014-10-21 | Discharge: 2014-10-21 | Disposition: A | Discharge status: Home / Self Care | Payer: 59 | Source: Ambulatory Visit | Attending: General Surgery | Admitting: General Surgery

## 2014-10-21 ENCOUNTER — Encounter: Payer: Self-pay | Admitting: *Deleted

## 2014-10-21 ENCOUNTER — Encounter: Payer: Self-pay | Admitting: Oncology

## 2014-10-21 ENCOUNTER — Telehealth: Payer: Self-pay | Admitting: *Deleted

## 2014-10-21 ENCOUNTER — Encounter (HOSPITAL_COMMUNITY): Payer: Self-pay

## 2014-10-21 DIAGNOSIS — C189 Malignant neoplasm of colon, unspecified: Secondary | ICD-10-CM | POA: Diagnosis not present

## 2014-10-21 LAB — CBC WITH DIFFERENTIAL/PLATELET
Basophils Absolute: 0 10*3/uL (ref 0.0–0.1)
Basophils Relative: 0 % (ref 0–1)
Eosinophils Absolute: 0.4 10*3/uL (ref 0.0–0.7)
Eosinophils Relative: 4 % (ref 0–5)
HEMATOCRIT: 34.3 % — AB (ref 36.0–46.0)
HEMOGLOBIN: 11.4 g/dL — AB (ref 12.0–15.0)
LYMPHS PCT: 16 % (ref 12–46)
Lymphs Abs: 1.5 10*3/uL (ref 0.7–4.0)
MCH: 28.9 pg (ref 26.0–34.0)
MCHC: 33.2 g/dL (ref 30.0–36.0)
MCV: 86.8 fL (ref 78.0–100.0)
Monocytes Absolute: 0.8 10*3/uL (ref 0.1–1.0)
Monocytes Relative: 9 % (ref 3–12)
NEUTROS ABS: 6.8 10*3/uL (ref 1.7–7.7)
Neutrophils Relative %: 71 % (ref 43–77)
Platelets: 302 10*3/uL (ref 150–400)
RBC: 3.95 MIL/uL (ref 3.87–5.11)
RDW: 15 % (ref 11.5–15.5)
WBC: 9.6 10*3/uL (ref 4.0–10.5)

## 2014-10-21 LAB — BASIC METABOLIC PANEL
Anion gap: 7 (ref 5–15)
BUN: 11 mg/dL (ref 6–23)
CALCIUM: 9.5 mg/dL (ref 8.4–10.5)
CO2: 26 mmol/L (ref 19–32)
CREATININE: 1.02 mg/dL (ref 0.50–1.10)
Chloride: 104 mmol/L (ref 96–112)
GFR calc Af Amer: 70 mL/min — ABNORMAL LOW (ref >90)
GFR, EST NON AFRICAN AMERICAN: 60 mL/min — AB (ref >90)
GLUCOSE: 92 mg/dL (ref 70–99)
Potassium: 4.3 mmol/L (ref 3.5–5.1)
Sodium: 137 mmol/L (ref 135–145)

## 2014-10-21 MED ORDER — CAPECITABINE 500 MG PO TABS: ORAL_TABLET | ORAL | Status: DC

## 2014-10-21 NOTE — H&P (Signed)
Martha Becker is an 59 y.o. female.   Chief Complaint: Colon carcinoma, need for central venous access HPI: Patient is a 59 year old white female with stage for colon carcinoma who now presents for a Port-A-Cath insertion. She is about to undergo chemotherapy.  Past Medical History  Diagnosis Date  . Colon cancer     Past Surgical History  Procedure Laterality Date  . Cesarean section    . Partial colectomy N/A 08/31/2014    Procedure: Right Colon Resection ;  Surgeon: Jamesetta So, MD;  Location: [facility];  Service: General;  Laterality: N/A;  . Liver biopsy N/A 08/31/2014    Procedure: LIVER BIOPSY;  Surgeon: Jamesetta So, MD;  Location: [facility];  Service: General;  Laterality: N/A;    Family History  Problem Relation Age of Onset  . Colon cancer Cousin     age late 61s  . Inflammatory bowel disease Neg Hx    Social History:  reports that she quit smoking about 20 years ago. Her smoking use included Cigarettes. She has a 27 pack-year smoking history. She does not have any smokeless tobacco history on file. She reports that she drinks about 0.6 oz of alcohol per week. She reports that she does not use illicit drugs.  Allergies:  Allergies  Allergen Reactions  . Codeine Nausea And Vomiting    No prescriptions prior to admission    No results found for this or any previous visit (from the past 48 hour(s)). No results found.  Review of Systems  All other systems reviewed and are negative.   There were no vitals taken for this visit. Physical Exam  Constitutional: She is oriented to person, place, and time. She appears well-developed and well-nourished.  HENT:  Head: Normocephalic and atraumatic.  Neck: Normal range of motion. Neck supple.  Cardiovascular: Normal rate, regular rhythm and normal heart sounds.   Respiratory: Effort normal and breath sounds normal.  GI: Soft. Bowel sounds are normal.  Incision well-healed.  Neurological: She is alert and oriented to  person, place, and time.  Skin: Skin is warm and dry.     Assessment/Plan Impression: Colon carcinoma, need for central venous access Plan: Patient will undergo Port-A-Cath insertion on 10/23/2014. The risks and benefits of the procedure including bleeding and pneumothorax were fully explained to the patient, who gave informed consent.  Martha Becker A 10/21/2014, 10:40 AM

## 2014-10-21 NOTE — Telephone Encounter (Signed)
Received fax from [place], Xeloda Rx will need to go to specialty pharmacy. Rx given to [facility] in managed care to be sent to appropriate pharmacy.

## 2014-10-21 NOTE — Patient Instructions (Signed)
Martha Becker  10/21/2014   Your procedure is scheduled on:   10/23/2014  Report to [facility] at  52  AM.  Call this number if you have problems the morning of surgery: [phone]   Remember:   Do not eat food or drink liquids after midnight.   Take these medicines the morning of surgery with A SIP OF WATER:  oxycodone   Do not wear jewelry, make-up or nail polish.  Do not wear lotions, powders, or perfumes.   Do not shave 48 hours prior to surgery. Men may shave face and neck.  Do not bring valuables to the hospital.  [facility] is not responsible for any belongings or valuables.               Contacts, dentures or bridgework may not be worn into surgery.  Leave suitcase in the car. After surgery it may be brought to your room.  For patients admitted to the hospital, discharge time is determined by your treatment team.               Patients discharged the day of surgery will not be allowed to drive home.  Name and phone number of your driver: family  Special Instructions: Shower using CHG 2 nights before surgery and the night before surgery.  If you shower the day of surgery use CHG.  Use special wash - you have one bottle of CHG for all showers.  You should use approximately 1/3 of the bottle for each shower.   Please read over the following fact sheets that you were given: Pain Booklet, Coughing and Deep Breathing, Surgical Site Infection Prevention, Anesthesia Post-op Instructions and Care and Recovery After Surgery Implanted Port Insertion An implanted port is a central line that has a round shape and is placed under the skin. It is used as a long-term IV access for:   Medicines, such as chemotherapy.   Fluids.   Liquid nutrition, such as total parenteral nutrition (TPN).   Blood samples.  LET [facility] CARE PROVIDER KNOW ABOUT:  Allergies to food or medicine.   Medicines taken, including vitamins, herbs, eye drops, creams, and over-the-counter  medicines.   Any allergies to heparin.  Use of steroids (by mouth or creams).   Previous problems with anesthetics or numbing medicines.   History of bleeding problems or blood clots.   Previous surgery.   Other health problems, including diabetes and kidney problems.   Possibility of pregnancy, if this applies. RISKS AND COMPLICATIONS Generally, this is a safe procedure. However, as with any procedure, problems can occur. Possible problems include:  Damage to the blood vessel, bruising, or bleeding at the puncture site.   Infection.  Blood clot in the vessel that the port is in.  Breakdown of the skin over your port.  Very rarely a person may develop a condition called a pneumothorax, a collection of air in the chest that may cause one of the lungs to collapse. The placement of these catheters with the appropriate imaging guidance significantly decreases the risk of a pneumothorax.  BEFORE THE PROCEDURE   Your health care provider may want you to have blood tests. These tests can help tell how well your kidneys and liver are working. They can also show how well your blood clots.   If you take blood thinners (anticoagulant medicines), ask your health care provider when you should stop taking them.   Make arrangements for someone to drive  you home. This is necessary if you have been sedated for your procedure.  PROCEDURE  Port insertion usually takes about 30-45 minutes.   An IV needle will be inserted in your arm. Medicine for pain and medicine to help relax you (sedative) will flow directly into your body through this needle.   You will lie on an exam table, and you will be connected to monitors to keep track of your heart rate, blood pressure, and breathing throughout the procedure.  An oxygen monitoring device may be attached to your finger. Oxygen will be given.   Everything will be kept as germ free (sterile) as possible during the procedure. The skin near  the point of the incision will be cleansed with antiseptic, and the area will be draped with sterile towels. The skin and deeper tissues over the port area will be made numb with a local anesthetic.  Two small cuts (incisions) will be made in the skin to insert the port. One will be made in the neck to obtain access to the vein where the catheter will lie.   Because the port reservoir will be placed under the skin, a small skin incision will be made in the upper chest, and a small pocket for the port will be made under the skin. The catheter that will be connected to the port tunnels to a large central vein in the chest. A small, raised area will remain on your body at the site of the reservoir when the procedure is complete.  The port placement will be done under imaging guidance to ensure the proper placement.  The reservoir has a silicone covering that can be punctured with a special needle.   The port will be flushed with normal saline, and blood will be drawn to make sure it is working properly.  There will be nothing remaining outside the skin when the procedure is finished.   Incisions will be held together by stitches, surgical glue, or a special tape. AFTER THE PROCEDURE  You will stay in a recovery area until the anesthesia has worn off. Your blood pressure and pulse will be checked.  A final chest X-ray will be taken to check the placement of the port and to ensure that there is no injury to your lung. Document Released: 06/18/2013 Document Revised: 01/12/2014 Document Reviewed: 06/18/2013 [facility] Patient Information 2015 [place], [place]. This information is not intended to replace advice given to you by your health care provider. Make sure you discuss any questions you have with your health care provider. PATIENT INSTRUCTIONS POST-ANESTHESIA  IMMEDIATELY FOLLOWING SURGERY:  Do not drive or operate machinery for the first twenty four hours after surgery.  Do not make any  important decisions for twenty four hours after surgery or while taking narcotic pain medications or sedatives.  If you develop intractable nausea and vomiting or a severe headache please notify your doctor immediately.  FOLLOW-UP:  Please make an appointment with your surgeon as instructed. You do not need to follow up with anesthesia unless specifically instructed to do so.  WOUND CARE INSTRUCTIONS (if applicable):  Keep a dry clean dressing on the anesthesia/puncture wound site if there is drainage.  Once the wound has quit draining you may leave it open to air.  Generally you should leave the bandage intact for twenty four hours unless there is drainage.  If the epidural site drains for more than 36-48 hours please call the anesthesia department.  QUESTIONS?:  Please feel free to call your  physician or the hospital operator if you have any questions, and they will be happy to assist you.

## 2014-10-21 NOTE — Progress Notes (Signed)
Faxed xeloda prescription to [facility] °

## 2014-10-21 NOTE — [facility] Oncology Navigator Note (Signed)
Called Veta at [facility] and requested K-RAS testing per Dr. Sherrill on following case:  Patient: [name], Martha Collected: 08/31/2014 Client: [place] Accession: [id number] Received: 09/01/2014 Mark Jenkins  

## 2014-10-21 NOTE — Telephone Encounter (Signed)
Per staff message and POF I have scheduled appts. Advised scheduler of appts. JMW  

## 2014-10-21 NOTE — Telephone Encounter (Signed)
Debbie from Dr. Aviva Signs cld to state they have sch pt port appt for 2/12 @7 :30 am-stated she will contact pt to adv she just wanted [place] to know she has made appt

## 2014-10-21 NOTE — Telephone Encounter (Signed)
Martha Becker had left VM earlier today requesting return call in regards to question she had. Returned call-left message on her VM to call back today, or I will find her tomorrow when she is here for chemo class.

## 2014-10-22 ENCOUNTER — Encounter (HOSPITAL_COMMUNITY): Payer: Self-pay | Admitting: Anesthesiology

## 2014-10-22 ENCOUNTER — Other Ambulatory Visit: Payer: 59

## 2014-10-22 ENCOUNTER — Encounter: Payer: Self-pay | Admitting: *Deleted

## 2014-10-22 ENCOUNTER — Other Ambulatory Visit (HOSPITAL_BASED_OUTPATIENT_CLINIC_OR_DEPARTMENT_OTHER): Payer: 59

## 2014-10-22 ENCOUNTER — Ambulatory Visit: Payer: 59

## 2014-10-22 ENCOUNTER — Telehealth: Payer: Self-pay | Admitting: *Deleted

## 2014-10-22 DIAGNOSIS — C180 Malignant neoplasm of cecum: Secondary | ICD-10-CM

## 2014-10-22 DIAGNOSIS — C7802 Secondary malignant neoplasm of left lung: Secondary | ICD-10-CM

## 2014-10-22 DIAGNOSIS — C182 Malignant neoplasm of ascending colon: Secondary | ICD-10-CM

## 2014-10-22 DIAGNOSIS — C7801 Secondary malignant neoplasm of right lung: Secondary | ICD-10-CM

## 2014-10-22 LAB — LACTATE DEHYDROGENASE (CC13): LDH: 289 U/L — ABNORMAL HIGH (ref 125–245)

## 2014-10-22 LAB — CBC WITH DIFFERENTIAL/PLATELET
BASO%: 0.8 % (ref 0.0–2.0)
BASOS ABS: 0.1 10*3/uL (ref 0.0–0.1)
EOS%: 4 % (ref 0.0–7.0)
Eosinophils Absolute: 0.4 10*3/uL (ref 0.0–0.5)
HCT: 36.1 % (ref 34.8–46.6)
HEMOGLOBIN: 11.6 g/dL (ref 11.6–15.9)
LYMPH%: 18.7 % (ref 14.0–49.7)
MCH: 27.9 pg (ref 25.1–34.0)
MCHC: 32 g/dL (ref 31.5–36.0)
MCV: 87.2 fL (ref 79.5–101.0)
MONO#: 0.7 10*3/uL (ref 0.1–0.9)
MONO%: 7.9 % (ref 0.0–14.0)
NEUT#: 6.1 10*3/uL (ref 1.5–6.5)
NEUT%: 68.6 % (ref 38.4–76.8)
Platelets: 333 10*3/uL (ref 145–400)
RBC: 4.14 10*6/uL (ref 3.70–5.45)
RDW: 15.3 % — AB (ref 11.2–14.5)
WBC: 8.9 10*3/uL (ref 3.9–10.3)
lymph#: 1.7 10*3/uL (ref 0.9–3.3)

## 2014-10-22 LAB — COMPREHENSIVE METABOLIC PANEL (CC13)
ALBUMIN: 3.8 g/dL (ref 3.5–5.0)
ALT: 13 U/L (ref 0–55)
AST: 22 U/L (ref 5–34)
Alkaline Phosphatase: 84 U/L (ref 40–150)
Anion Gap: 11 mEq/L (ref 3–11)
BUN: 10.7 mg/dL (ref 7.0–26.0)
CALCIUM: 10 mg/dL (ref 8.4–10.4)
CHLORIDE: 104 meq/L (ref 98–109)
CO2: 27 meq/L (ref 22–29)
CREATININE: 0.9 mg/dL (ref 0.6–1.1)
EGFR: 76 mL/min/{1.73_m2} — ABNORMAL LOW (ref >90)
Glucose: 98 mg/dl (ref 70–140)
POTASSIUM: 4.2 meq/L (ref 3.5–5.1)
Sodium: 141 mEq/L (ref 136–145)
TOTAL PROTEIN: 7.2 g/dL (ref 6.4–8.3)
Total Bilirubin: 0.36 mg/dL (ref 0.20–1.20)

## 2014-10-22 LAB — CEA: CEA: 9.7 ng/mL — ABNORMAL HIGH (ref 0.0–5.0)

## 2014-10-22 NOTE — Telephone Encounter (Signed)
Pt declined implanted port. Per Dr. Benay Spice: Avastin to start with Cycle 1 of chemo. Jaclyn Shaggy, PharmD made aware.

## 2014-10-23 ENCOUNTER — Encounter (HOSPITAL_COMMUNITY): Admission: RE | Payer: Self-pay | Source: Ambulatory Visit

## 2014-10-23 ENCOUNTER — Ambulatory Visit (HOSPITAL_COMMUNITY): Admission: RE | Admit: 2014-10-23 | Payer: 59 | Source: Ambulatory Visit | Admitting: General Surgery

## 2014-10-23 ENCOUNTER — Other Ambulatory Visit (HOSPITAL_COMMUNITY)
Admission: RE | Admit: 2014-10-23 | Discharge: 2014-10-23 | Disposition: A | Discharge status: Home / Self Care | Payer: 59 | Source: Ambulatory Visit | Attending: Oncology | Admitting: Oncology

## 2014-10-23 ENCOUNTER — Encounter: Payer: Self-pay | Admitting: Oncology

## 2014-10-23 DIAGNOSIS — C189 Malignant neoplasm of colon, unspecified: Secondary | ICD-10-CM | POA: Insufficient documentation

## 2014-10-23 SURGERY — INSERTION, TUNNELED CENTRAL VENOUS DEVICE, WITH PORT
Anesthesia: General

## 2014-10-23 NOTE — Progress Notes (Signed)
[facility] approved xeloda from 10/22/14-10/22/16

## 2014-10-25 ENCOUNTER — Other Ambulatory Visit: Payer: Self-pay | Admitting: Oncology

## 2014-10-26 ENCOUNTER — Telehealth: Payer: Self-pay | Admitting: *Deleted

## 2014-10-26 NOTE — Telephone Encounter (Signed)
Martha Becker called expressing her fear of the cancer treatment and wondering if it is "worth it". Confirmed that fear is a normal feeling, but she needs to focus on why she is getting chemo and stop listening to others telling her about their or other's experiences were. Different drugs, doses, diagnoses determine what side effects she may have. Encouraged her to use previous coping skills in this situation also and not let fear paralyze her or cause her to make an unwise decision. Reassured her that we will be keeping close eye on her and staying in touch and will adjust her regimen as needed. She inquired about Marinol for nausea/appetite. Confirmed that we are aware of this drug and that she may not even need it. Reviewed holistic treatment and reminded her that chemo is a part of this also. Again admits that she is feeling stressed. Allowed her to vent and she appreciates nurse's time. Total time on phone with patient 25 minutes.

## 2014-10-26 NOTE — Telephone Encounter (Signed)
Called [facility]: No script for Xeloda received. Called [facility]: They have order, but on hold for PA. Notified that PA was received on 10/23/14. Ran script through and told it can't be filled there. [place] will determine where they need to send it and call GI Navigator back.

## 2014-10-26 NOTE — Telephone Encounter (Signed)
Return call from [facility] w/[facility]. They will forward script to [facility]-can follow up by calling #[phone]. Spoke with Charlena Cross in managed care, she will also fax script with authorization # [facility].

## 2014-10-27 ENCOUNTER — Telehealth: Payer: Self-pay | Admitting: *Deleted

## 2014-10-27 ENCOUNTER — Other Ambulatory Visit: Payer: Self-pay | Admitting: *Deleted

## 2014-10-27 ENCOUNTER — Telehealth: Payer: Self-pay

## 2014-10-27 ENCOUNTER — Encounter: Payer: Self-pay | Admitting: Oncology

## 2014-10-27 MED ORDER — CAPECITABINE 500 MG PO TABS: ORAL_TABLET | ORAL | Status: DC

## 2014-10-27 NOTE — Telephone Encounter (Signed)
PT. RETURNED [place]. IT WAS REQUESTED THAT A FINANCIAL ADVOCATE CALL PT. TODAY.

## 2014-10-27 NOTE — Telephone Encounter (Signed)
[facility] called to clarify some information before they shipped xeloda. 1) verify the Rx b/c they received it from another pharmacy 2) cycle information how many days on and how many days off 3) is generic OK? The call back number is [phone] opt 1.

## 2014-10-27 NOTE — Telephone Encounter (Signed)
S/w Rosalie Doctor at [facility]. She transferred me to pharmacist. S/w Clarene Critchley pharmacist. She took clarification information and will get the medication shipped. 1) yes it is our Rx 2) 2 weeks on 1 week off. Only 1 cycle ordered until we see how she does. 3) generic is OK

## 2014-10-27 NOTE — Telephone Encounter (Signed)
LEFT PT. A VOICE MAIL TO CONTACT, FINANCIAL ADVOCATE, RAQUEL BROWNING, FOR FINANCIAL ASSISTANCE.

## 2014-10-27 NOTE — Telephone Encounter (Signed)
INFORMED PT. THAT THE INFORMATION FOR XELODA HAS BEEN CLARIFIED WITH [facility]. PT. WILL CONTINUE TO CALL ABOUT FINANCIAL ASSISTANCE PROGRAMS GIVEN TO HER BY [facility].

## 2014-10-27 NOTE — Progress Notes (Signed)
Called [facility] back with [facility] on the phone. They gave her multiple copay programs and I went on the internet to help her find assistance.  She was denied for many but only one told her that they will give her an answer in 24-72 hours.  Her insurance paid 80% and she is responsible for 20%, she is upset that she has to pay $500+, but she went ahead and paid it so it will be shipped tonight.  I told her MAYBE if she is approved she can ask the company if they reimburse.

## 2014-10-27 NOTE — Telephone Encounter (Signed)
[facility] and spoke with representative John. Just received the script yesterday from [facility] and still in insurance verification. After he looked up her benefits, said her copay for this drug is 25%, which is about $615 as far as his information is. Says if she reports she can't afford the drug, they will look into other options-there is no co pay assist with this drug due to it being generic. Patient should be hearing from them late today or tomorrow. It is possible to have drug available for evening of 10/29/14.

## 2014-10-28 ENCOUNTER — Telehealth: Payer: Self-pay | Admitting: *Deleted

## 2014-10-28 NOTE — Telephone Encounter (Signed)
Called to follow up on status of her Xeloda shipment. She expects shipment this afternoon. Stressed about her co pay, but she will continue to pay as long as she is able. Awaiting to hear from [employer] about some assist with her co pay. Does not want to go on Medicaid or have to reveal too much about her income. Seems less worried today, and just hoping she will still be able to be independent. Stressed for her to eat properly, stay active and continue to do what brings her joy (loves to remodel her home). Expressed concern over dosing of Xeloda and confirmed this is standard dose based on her weight. We will monitor her response to chemo and make changes as needed.

## 2014-10-28 NOTE — Telephone Encounter (Signed)
Patient called to inquire if it was necessary for 6 months of chemotherapy? Expresses concern again regarding her quality of life. Has been researching on internet and fears the adverse events from chemo. Asking RN for opinion on "proton therapy". Informed her to only rely on reliable websites such as ACS, NIH. Reminded her that current treatment is standard of care and she will be monitored frequently.Inqired if ok to use retinol lotion on her face? Suggested she bring the lotion to tx area tomorrow and we can get pharmacist recommendation. Made her aware it may not be in her best interest to use this with dermatologic side effects of xeloda.

## 2014-10-29 ENCOUNTER — Encounter: Payer: Self-pay | Admitting: *Deleted

## 2014-10-29 ENCOUNTER — Other Ambulatory Visit: Payer: 59

## 2014-10-29 ENCOUNTER — Ambulatory Visit: Payer: 59 | Admitting: Nutrition

## 2014-10-29 ENCOUNTER — Ambulatory Visit (HOSPITAL_BASED_OUTPATIENT_CLINIC_OR_DEPARTMENT_OTHER): Payer: 59

## 2014-10-29 ENCOUNTER — Other Ambulatory Visit (HOSPITAL_BASED_OUTPATIENT_CLINIC_OR_DEPARTMENT_OTHER): Payer: 59

## 2014-10-29 DIAGNOSIS — C180 Malignant neoplasm of cecum: Secondary | ICD-10-CM

## 2014-10-29 DIAGNOSIS — Z5112 Encounter for antineoplastic immunotherapy: Secondary | ICD-10-CM

## 2014-10-29 DIAGNOSIS — Z5111 Encounter for antineoplastic chemotherapy: Secondary | ICD-10-CM

## 2014-10-29 LAB — COMPREHENSIVE METABOLIC PANEL (CC13)
ALBUMIN: 3.7 g/dL (ref 3.5–5.0)
ALT: 14 U/L (ref 0–55)
ANION GAP: 9 meq/L (ref 3–11)
AST: 23 U/L (ref 5–34)
Alkaline Phosphatase: 91 U/L (ref 40–150)
BUN: 12.6 mg/dL (ref 7.0–26.0)
CALCIUM: 10.1 mg/dL (ref 8.4–10.4)
CHLORIDE: 104 meq/L (ref 98–109)
CO2: 28 meq/L (ref 22–29)
Creatinine: 0.8 mg/dL (ref 0.6–1.1)
EGFR: 81 mL/min/{1.73_m2} — AB (ref >90)
GLUCOSE: 88 mg/dL (ref 70–140)
POTASSIUM: 4.6 meq/L (ref 3.5–5.1)
SODIUM: 141 meq/L (ref 136–145)
TOTAL PROTEIN: 6.9 g/dL (ref 6.4–8.3)
Total Bilirubin: 0.43 mg/dL (ref 0.20–1.20)

## 2014-10-29 LAB — CBC WITH DIFFERENTIAL/PLATELET
BASO%: 0.7 % (ref 0.0–2.0)
BASOS ABS: 0.1 10*3/uL (ref 0.0–0.1)
EOS%: 3.6 % (ref 0.0–7.0)
Eosinophils Absolute: 0.3 10*3/uL (ref 0.0–0.5)
HEMATOCRIT: 34.3 % — AB (ref 34.8–46.6)
HGB: 11.2 g/dL — ABNORMAL LOW (ref 11.6–15.9)
LYMPH%: 16.8 % (ref 14.0–49.7)
MCH: 28 pg (ref 25.1–34.0)
MCHC: 32.5 g/dL (ref 31.5–36.0)
MCV: 86.1 fL (ref 79.5–101.0)
MONO#: 0.9 10*3/uL (ref 0.1–0.9)
MONO%: 10.1 % (ref 0.0–14.0)
NEUT%: 68.8 % (ref 38.4–76.8)
NEUTROS ABS: 6.4 10*3/uL (ref 1.5–6.5)
PLATELETS: 313 10*3/uL (ref 145–400)
RBC: 3.99 10*6/uL (ref 3.70–5.45)
RDW: 15.4 % — ABNORMAL HIGH (ref 11.2–14.5)
WBC: 9.2 10*3/uL (ref 3.9–10.3)
lymph#: 1.5 10*3/uL (ref 0.9–3.3)

## 2014-10-29 MED ORDER — OXALIPLATIN CHEMO INJECTION 100 MG/20ML
100.0000 mg/m2 | Freq: Once | INTRAVENOUS | Status: AC
  Administered 2014-10-29: 175 mg via INTRAVENOUS
  Filled 2014-10-29: qty 35

## 2014-10-29 MED ORDER — DEXAMETHASONE SODIUM PHOSPHATE 10 MG/ML IJ SOLN
10.0000 mg | Freq: Once | INTRAMUSCULAR | Status: AC
  Administered 2014-10-29: 10 mg via INTRAVENOUS

## 2014-10-29 MED ORDER — SODIUM CHLORIDE 0.9 % IV SOLN
7.5000 mg/kg | Freq: Once | INTRAVENOUS | Status: AC
  Administered 2014-10-29: 475 mg via INTRAVENOUS
  Filled 2014-10-29: qty 19

## 2014-10-29 MED ORDER — DEXTROSE 5 % IV SOLN
Freq: Once | INTRAVENOUS | Status: AC
  Administered 2014-10-29: 15:00:00 via INTRAVENOUS

## 2014-10-29 MED ORDER — DEXAMETHASONE SODIUM PHOSPHATE 10 MG/ML IJ SOLN
INTRAMUSCULAR | Status: AC
  Filled 2014-10-29: qty 1

## 2014-10-29 MED ORDER — ONDANSETRON 8 MG/NS 50 ML IVPB
INTRAVENOUS | Status: AC
  Filled 2014-10-29: qty 8

## 2014-10-29 MED ORDER — ONDANSETRON 8 MG/50ML IVPB (CHCC)
8.0000 mg | Freq: Once | INTRAVENOUS | Status: AC
  Administered 2014-10-29: 8 mg via INTRAVENOUS

## 2014-10-29 MED ORDER — SODIUM CHLORIDE 0.9 % IV SOLN
Freq: Once | INTRAVENOUS | Status: AC
  Administered 2014-10-29: 14:00:00 via INTRAVENOUS

## 2014-10-29 NOTE — Progress Notes (Signed)
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  She is a patient of Dr. [name].  Past medical history nonsignificant.  Medications include B complex vitamin, Xeloda, magnesium, fish oil, Zofran, probiotics, selenium, and vitamin B12.  Labs were reviewed.  Height: 66 inches. Weight: 144 pounds. Usual body weight: Patient reports usual body weight fluctuates between 142 pounds and 148 pounds. BMI: 23.25.  Patient's weight is currently stable.  Today's her first treatment and she is anxious. Patient has the Eating Hints book and has read the information.   She wonders if it is okay for her to drink 2 cups of caffeinated coffee in the morning. She verbalizes that she hopes to avoid nutrition side effects. Patient's chemotherapy includes oxaliplatin.  Nutrition diagnosis:  Food and nutrition related knowledge deficit related to new diagnosis of colon cancer and associated treatments as evidenced by no prior need for nutrition related information.  Intervention: Educated patient to consume adequate calories and protein in small frequent meals daily to promote weight maintenance. Reviewed high protein food choices. Reviewed information on oxaliplatin and food choices.  Fact sheet was provided. Questions were answered.  Teach back method used.  Contact information was given.  Monitoring, evaluation, goals: Patient will tolerate adequate calories and protein with minimal side effects to promote weight maintenance.  Next visit: Friday, March 11, during chemotherapy.  **Disclaimer: This note was dictated with voice recognition software. Similar sounding words can inadvertently be transcribed and this note may contain transcription errors which may not have been corrected upon publication of note.**

## 2014-10-29 NOTE — Progress Notes (Signed)
[place] Work  Clinical Social Work was referred by patient navigator for assessment of psychosocial needs. Clinical Social Worker met with patient at [facility] during her first chemo to offer support and assess for needs. Pt reports to live alone, is divorced and is used to being very independent. She shared she has tried to rebuild her life and has worked hard to save some money and purchase a property. She shared fears of losing her independence, financial concerns and adjusting to her illness. CSW reviewed resources through [place] and the community to assist her during this difficult time. Pt may attend GI Support Group, but is not sure currently. We reviewed [facility] as a resource for assistance and she meets their financial criteria. CSW explained that since she meets their guidelines she also meets income guidelines for medicaid. Pt was not willing to apply for medicaid currently. She reports she went through that with her mother in [place] and she does not want to apply. She has $4500 out of pocket portion of her medical coverage that she is concerned about paying. CSW suggested pt think about medicaid and remember it is there if she changes her mind. She plans to apply for disability and will meet with ss disability in [facility].   She plans to drive herself to treatment after today. Her sister is with her today. She has a brother as well and good support from her family. She agrees to reach out to CSW as needed and plans to bring Cancer Care application back at her next appointment.   Clinical Social Work interventions: Resource education Emotional support  Loren Racer, [place] Worker [place]  [place] Phone: [phone] Fax: [phone]

## 2014-10-29 NOTE — Patient Instructions (Signed)
[place] Discharge Instructions for Patients Receiving Chemotherapy  Today you received the following chemotherapy agents Oxaliplatin and Avastin.  To help prevent nausea and vomiting after your treatment, we encourage you to take your nausea medication every 8 hours as needed. Remember to avoid cold drinks and food, avoid cold because it will cause pain for the next 4-5 days.   If you develop nausea and vomiting that is not controlled by your nausea medication, call the clinic.   BELOW ARE SYMPTOMS THAT SHOULD BE REPORTED IMMEDIATELY:  *FEVER GREATER THAN 100.5 F  *CHILLS WITH OR WITHOUT FEVER  NAUSEA AND VOMITING THAT IS NOT CONTROLLED WITH YOUR NAUSEA MEDICATION  *UNUSUAL SHORTNESS OF BREATH  *UNUSUAL BRUISING OR BLEEDING  TENDERNESS IN MOUTH AND THROAT WITH OR WITHOUT PRESENCE OF ULCERS  *URINARY PROBLEMS  *BOWEL PROBLEMS  UNUSUAL RASH Items with * indicate a potential emergency and should be followed up as soon as possible.  Feel free to call the clinic you have any questions or concerns. The clinic phone number is [phone].

## 2014-10-30 ENCOUNTER — Encounter: Payer: Self-pay | Admitting: *Deleted

## 2014-10-30 ENCOUNTER — Encounter (HOSPITAL_COMMUNITY): Payer: Self-pay

## 2014-10-30 ENCOUNTER — Telehealth: Payer: Self-pay | Admitting: *Deleted

## 2014-10-30 NOTE — Telephone Encounter (Signed)
Chemo Follow up Call: Trouble sleeping last night. Noticed a few hot flashes yesterday- improved today. Strange sensation with first bite of food or drinking. Voiced appropriate cold avoidance techniques. Reports achy/ tender feeling in L arm a few inches above IV site. Area feels "like sandpaper." Pt denies rash, redness or swelling. Instructed her to try warmth to site and call office if this worsens. She voiced understanding. Antiemetic and antidiarrheal instructions reviewed, teach back method used. Reviewed hand/foot syndrome associated with Xeloda, pt understands to call office if this occurs.

## 2014-10-30 NOTE — [facility] Oncology Navigator Note (Signed)
Met with patient during initial chemotherapy treatment to provide support and assess for needs to promote continuity of care: discussed the followying 1. Do no use her retinol cream during current regimen due to potential adverse effect to skin 2. How we know that the lung nodules are not something else- 3. Talking about delay of tx for few months-told her she could impact her outcome in negative way. Merceda Elks, RN, BSN GI Oncology [place]

## 2014-10-30 NOTE — Telephone Encounter (Signed)
[facility] was dispensed on 10/27/14.

## 2014-11-02 ENCOUNTER — Telehealth: Payer: Self-pay | Admitting: *Deleted

## 2014-11-02 ENCOUNTER — Other Ambulatory Visit (HOSPITAL_COMMUNITY)
Admission: RE | Admit: 2014-11-02 | Discharge: 2014-11-02 | Disposition: A | Discharge status: Home / Self Care | Payer: 59 | Source: Ambulatory Visit | Attending: Oncology | Admitting: Oncology

## 2014-11-02 DIAGNOSIS — C180 Malignant neoplasm of cecum: Secondary | ICD-10-CM | POA: Insufficient documentation

## 2014-11-02 DIAGNOSIS — C7802 Secondary malignant neoplasm of left lung: Secondary | ICD-10-CM | POA: Diagnosis not present

## 2014-11-02 DIAGNOSIS — C7801 Secondary malignant neoplasm of right lung: Secondary | ICD-10-CM | POA: Insufficient documentation

## 2014-11-02 NOTE — Telephone Encounter (Signed)
Returned call to pt, she reports "severe fatigue" yesterday. Had difficulty fixing something to eat and going to the bathroom. Improved today. Pt reports eye pain when crying similar to the "lockjaw" pain when she first takes a bite of food. Informed her this is likely related to the Oxaliplatin. Call for any persistent pain. Pt voiced understanding. Will make Dr. Benay Spice aware of call.

## 2014-11-02 NOTE — Telephone Encounter (Signed)
Patient left a voice message stating," I have several questions regarding fatigue. I need to talk to Dr. Benay Spice. My return number is [phone]." This RN left a message on her phone and asked her to call back to [phone] and ask for triage.

## 2014-11-02 NOTE — Telephone Encounter (Signed)
Requested Extended K-RAS testing for specimen #Patient: Martha Becker, Martha Becker Collected: 08/31/2014 Client: [facility] Accession: [id number] from [facility].

## 2014-11-06 ENCOUNTER — Telehealth: Payer: Self-pay | Admitting: *Deleted

## 2014-11-06 DIAGNOSIS — C180 Malignant neoplasm of cecum: Secondary | ICD-10-CM

## 2014-11-06 MED ORDER — PROCHLORPERAZINE MALEATE 10 MG PO TABS: 10.0000 mg | ORAL_TABLET | Freq: Four times a day (QID) | ORAL | Status: AC | PRN

## 2014-11-06 NOTE — Telephone Encounter (Signed)
PT.'S STOOLS HAVE BEEN LOOSE ALL WEEK. SHE HAS TAKEN ONLY TWO IMODIUM. INSTRUCTED PT. HOW TO TAKE THE IMODIUM. HER FLUID INTAKE IN THE PAST 24 HOURS HAS BEEN 30 OUNCES. SHE HAS BEEN NAUSEATED BUT NO VOMITING. PT. TOOK ONE ZOFRAN WHICH HELPED SOME. HER STOMACH "HURTS". SHE IS BURPING A LOT. PT. FEELS VERY "EDGY". SHE HAD OXALIPLATIN AND START XELODA ON 10/29/14. VERBAL ORDER AND READ BACK TO DR.[place]. NEEDS TO TAKE IMODIUM AND ZOFRAN AS DIRECTED. PT. CAN ALSO TAKE COMPAZINE. SHE NEEDS TO FORCE FLUIDS. PT. TO HOLD XELODA UNTIL DIARRHEA HAS STOPPED. INSTRUCTED PT. IN THE ABOVE ORDERS. ESCRIBED COMPAZINE TO PT.'S PHARMACY. ALSO GAVE PT. INSTRUCTIONS WITH HER DIET. PT.WROTE DOWN INSTRUCTIONS AND UNDERSTANDING BY TEACH BACK METHOD. IF PT. HAS ANY PROBLEMS THIS WEEKEND SHE WILL CONTACT THE ON CALL PHYSICIAN.

## 2014-11-09 ENCOUNTER — Telehealth: Payer: Self-pay | Admitting: *Deleted

## 2014-11-09 NOTE — Telephone Encounter (Signed)
This RN returned patient's phone call concerning explosive diarrhea over the weekend. Patient stated,"this is just not working out. Imodium isn't working. I took two pills yesterday morning and had diarrhea 5x. I can't live like this for 6 months. I didn't expect chemo to be like this." I explained to her how to take Imodium. Take 2 pills after each bowel movement. She can take a total of 8 pills in a 24 hour period. Reinforced that she needs to be drinking at least 68 ounces of water daily and eating. Patient stated," I drank 32 ounces yesterday. I'm trying. I can't live like this." Per Dr. Benay Spice, have her come in today for labs/MD appointment since, he is on call. Patient stated,"I live by myself and I can't keep running up and down the road for everything that goes wrong. I'm so fatigued I can't get off the couch. I don't have anyone to bring me. I don't think I need to come in today." Reviewed instructions on how to take Imodium, Zofran and Compazine. Patient stated,"Compazine. What's that?" I stated,"Compazine is used for nausea and it was phoned in to your pharmacy last Friday. Are you not taking it?" Patient stated,"I never picked up the prescription." Instructed patient to go and get the prescription. Instructed patient to call [facility] tomorrow and let us know how she is doing. Reinforced to patient that Dr. Benay Spice would really like to see her today so, he can assess the situation. Patient stated, "No. I can't come today." This conversation was 20 minutes long and teaching was done by Teach Back method. Patient verbalized understanding of medications and fluid intake. This message will be given to Dr. Benay Spice.

## 2014-11-10 ENCOUNTER — Other Ambulatory Visit: Payer: Self-pay | Admitting: *Deleted

## 2014-11-10 ENCOUNTER — Telehealth: Payer: Self-pay | Admitting: *Deleted

## 2014-11-10 ENCOUNTER — Ambulatory Visit (HOSPITAL_BASED_OUTPATIENT_CLINIC_OR_DEPARTMENT_OTHER): Payer: 59 | Admitting: Oncology

## 2014-11-10 ENCOUNTER — Ambulatory Visit (HOSPITAL_BASED_OUTPATIENT_CLINIC_OR_DEPARTMENT_OTHER): Payer: 59

## 2014-11-10 ENCOUNTER — Other Ambulatory Visit (HOSPITAL_BASED_OUTPATIENT_CLINIC_OR_DEPARTMENT_OTHER): Payer: 59

## 2014-11-10 ENCOUNTER — Encounter: Payer: Self-pay | Admitting: *Deleted

## 2014-11-10 VITALS — BP 104/48 | HR 72 | Temp 98.7°F | Resp 18 | Ht 66.0 in | Wt 141.6 lb

## 2014-11-10 VITALS — BP 97/51 | HR 75 | Temp 98.7°F | Resp 18

## 2014-11-10 DIAGNOSIS — R110 Nausea: Secondary | ICD-10-CM

## 2014-11-10 DIAGNOSIS — C182 Malignant neoplasm of ascending colon: Secondary | ICD-10-CM

## 2014-11-10 DIAGNOSIS — C180 Malignant neoplasm of cecum: Secondary | ICD-10-CM

## 2014-11-10 DIAGNOSIS — R197 Diarrhea, unspecified: Secondary | ICD-10-CM

## 2014-11-10 DIAGNOSIS — C786 Secondary malignant neoplasm of retroperitoneum and peritoneum: Secondary | ICD-10-CM

## 2014-11-10 DIAGNOSIS — C7800 Secondary malignant neoplasm of unspecified lung: Secondary | ICD-10-CM

## 2014-11-10 LAB — COMPREHENSIVE METABOLIC PANEL (CC13)
ALT: <6 U/L (ref 0–55)
AST: 13 U/L (ref 5–34)
Albumin: 2.9 g/dL — ABNORMAL LOW (ref 3.5–5.0)
Alkaline Phosphatase: 75 U/L (ref 40–150)
Anion Gap: 11 mEq/L (ref 3–11)
BILIRUBIN TOTAL: 0.36 mg/dL (ref 0.20–1.20)
BUN: 10 mg/dL (ref 7.0–26.0)
CO2: 23 mEq/L (ref 22–29)
CREATININE: 0.8 mg/dL (ref 0.6–1.1)
Calcium: 9 mg/dL (ref 8.4–10.4)
Chloride: 99 mEq/L (ref 98–109)
EGFR: 89 mL/min/{1.73_m2} — ABNORMAL LOW (ref >90)
Glucose: 101 mg/dl (ref 70–140)
Potassium: 3.8 mEq/L (ref 3.5–5.1)
Sodium: 133 mEq/L — ABNORMAL LOW (ref 136–145)
Total Protein: 5.6 g/dL — ABNORMAL LOW (ref 6.4–8.3)

## 2014-11-10 LAB — CBC WITH DIFFERENTIAL/PLATELET
BASO%: 0.4 % (ref 0.0–2.0)
Basophils Absolute: 0 10*3/uL (ref 0.0–0.1)
EOS%: 2.9 % (ref 0.0–7.0)
Eosinophils Absolute: 0.3 10*3/uL (ref 0.0–0.5)
HEMATOCRIT: 36.5 % (ref 34.8–46.6)
HGB: 11.7 g/dL (ref 11.6–15.9)
LYMPH%: 12.3 % — ABNORMAL LOW (ref 14.0–49.7)
MCH: 27.2 pg (ref 25.1–34.0)
MCHC: 32.1 g/dL (ref 31.5–36.0)
MCV: 84.6 fL (ref 79.5–101.0)
MONO#: 0.8 10*3/uL (ref 0.1–0.9)
MONO%: 7.6 % (ref 0.0–14.0)
NEUT#: 8.5 10*3/uL — ABNORMAL HIGH (ref 1.5–6.5)
NEUT%: 76.8 % (ref 38.4–76.8)
PLATELETS: 323 10*3/uL (ref 145–400)
RBC: 4.31 10*6/uL (ref 3.70–5.45)
RDW: 14.8 % — ABNORMAL HIGH (ref 11.2–14.5)
WBC: 11.1 10*3/uL — AB (ref 3.9–10.3)
lymph#: 1.4 10*3/uL (ref 0.9–3.3)

## 2014-11-10 MED ORDER — DEXAMETHASONE SODIUM PHOSPHATE 10 MG/ML IJ SOLN
INTRAMUSCULAR | Status: AC
  Filled 2014-11-10: qty 1

## 2014-11-10 MED ORDER — SODIUM CHLORIDE 0.9 % IV SOLN
INTRAVENOUS | Status: DC
  Administered 2014-11-10: 16:00:00 via INTRAVENOUS

## 2014-11-10 MED ORDER — PROCHLORPERAZINE EDISYLATE 5 MG/ML IJ SOLN
INTRAMUSCULAR | Status: AC
  Filled 2014-11-10: qty 2

## 2014-11-10 MED ORDER — PROCHLORPERAZINE EDISYLATE 5 MG/ML IJ SOLN
5.0000 mg | Freq: Once | INTRAMUSCULAR | Status: AC
Start: 2014-11-10 — End: 2014-11-10
  Administered 2014-11-10: 5 mg via INTRAVENOUS

## 2014-11-10 MED ORDER — SODIUM CHLORIDE 0.9 % IV SOLN: INTRAVENOUS | Status: DC

## 2014-11-10 MED ORDER — DIPHENOXYLATE-ATROPINE 2.5-0.025 MG PO TABS: 1.0000 | ORAL_TABLET | Freq: Four times a day (QID) | ORAL | Status: DC | PRN

## 2014-11-10 MED ORDER — DEXAMETHASONE SODIUM PHOSPHATE 10 MG/ML IJ SOLN
10.0000 mg | Freq: Once | INTRAMUSCULAR | Status: AC
  Administered 2014-11-10: 10 mg via INTRAVENOUS

## 2014-11-10 MED ORDER — DEXAMETHASONE SODIUM PHOSPHATE 10 MG/ML IJ SOLN: 10.0000 mg | Freq: Once | INTRAMUSCULAR | Status: DC

## 2014-11-10 NOTE — Progress Notes (Signed)
  [place] OFFICE PROGRESS NOTE   Diagnosis: Colon cancer  INTERVAL HISTORY:   Ms. Lucchetti returns prior to a scheduled visit. She completed a first cycle of CAPOX beginning on 10/29/2014. She reports developing fatigue beginning on day 3. Diarrhea began approximately day 5. The diarrhea has not improved with Imodium. She discontinued Xeloda on day 9 secondary to persistent diarrhea. She reports 4-5 bowel movements yesterday and 1 today. The stool is "watery ". She reports persistent nausea for the past week. No vomiting. No fever. Cold sensitivity lasted 6-7 days following chemotherapy. She is tolerating liquids. She had a sore at the right side of the tongue and right buccal mucosa.  Objective:  Vital signs in last 24 hours:  Blood pressure 104/48, pulse 72, temperature 98.7 F (37.1 C), temperature source Oral, resp. rate 18, height _0  (1.676 m), weight 141 lb 9.6 oz (64.229 kg).    HEENT: 2 mm ulcer at the right side of the tongue, no buccal ulcers, no thrush, the mucus membranes are moist Resp: Lungs clear bilaterally Cardio: Regular rate and rhythm GI: No hepatosplenomegaly, soft, bowel sounds are present, mild tenderness in the bilateral mid abdomen Vascular: No leg edema  Skin: Mildly diminished skin turgor, palms and soles without erythema   Lab Results:  Lab Results  Component Value Date   WBC 11.1* 11/10/2014   HGB 11.7 11/10/2014   HCT 36.5 11/10/2014   MCV 84.6 11/10/2014   PLT 323 11/10/2014   NEUTROABS 8.5* 11/10/2014   Potassium 3.8, BUN 10, creatinine 0.8, albumin 2.9   Lab Results  Component Value Date   CEA 9.7* 10/22/2014    Medications: I have reviewed the patient's current medications  Assessment/plan:  1. 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 Elevated TSH, PET scan 09/22/2014 with diffuse increased metabolic uptake in the thyroid   3. Family history of multiple cancers including colon cancer in her paternal grandfather and rectal cancer in a paternal first cousin  46.    [place] likely secondary to capecitabine   Disposition:  Ms. Plaisted is now at day 4 following a first cycle of CAPOX. She presents with diarrhea, malaise, and nausea. The diarrhea is most likely secondary to capecitabine. The capecitabine will remain on hold. She will try Lomotil for the diarrhea. She will receive intravenous fluids today and tomorrow. We will add Decadron for delayed nausea and she will try Compazine as needed. She will contact us for persistent diarrhea and nausea. We will check a stool for the C. difficile toxin. She will return as scheduled for cycle 2 chemotherapy on 11/20/2014. We will see her sooner if the diarrhea persists.    Betsy Coder, MD  11/10/2014  4:17 PM

## 2014-11-10 NOTE — Patient Instructions (Signed)
Dehydration, Adult Dehydration is when you lose more fluids from the body than you take in. Vital organs like the kidneys, brain, and heart cannot function without a proper amount of fluids and salt. Any loss of fluids from the body can cause dehydration.  CAUSES   Vomiting.  Diarrhea.  Excessive sweating.  Excessive urine output.  Fever. SYMPTOMS  Mild dehydration  Thirst.  Dry lips.  Slightly dry mouth. Moderate dehydration  Very dry mouth.  Sunken eyes.  Skin does not bounce back quickly when lightly pinched and released.  Dark urine and decreased urine production.  Decreased tear production.  Headache. Severe dehydration  Very dry mouth.  Extreme thirst.  Rapid, weak pulse (more than 100 beats per minute at rest).  Cold hands and feet.  Not able to sweat in spite of heat and temperature.  Rapid breathing.  Blue lips.  Confusion and lethargy.  Difficulty being awakened.  Minimal urine production.  No tears. DIAGNOSIS  Your caregiver will diagnose dehydration based on your symptoms and your exam. Blood and urine tests will help confirm the diagnosis. The diagnostic evaluation should also identify the cause of dehydration. TREATMENT  Treatment of mild or moderate dehydration can often be done at home by increasing the amount of fluids that you drink. It is best to drink small amounts of fluid more often. Drinking too much at one time can make vomiting worse. Refer to the home care instructions below. Severe dehydration needs to be treated at the hospital where you will probably be given intravenous (IV) fluids that contain water and electrolytes. HOME CARE INSTRUCTIONS   Ask your caregiver about specific rehydration instructions.  Drink enough fluids to keep your urine clear or pale yellow.  Drink small amounts frequently if you have nausea and vomiting.  Eat as you normally do.  Avoid:  Foods or drinks high in sugar.  Carbonated  drinks.  Juice.  Extremely hot or cold fluids.  Drinks with caffeine.  Fatty, greasy foods.  Alcohol.  Tobacco.  Overeating.  Gelatin desserts.  Wash your hands well to avoid spreading bacteria and viruses.  Only take over-the-counter or prescription medicines for pain, discomfort, or fever as directed by your caregiver.  Ask your caregiver if you should continue all prescribed and over-the-counter medicines.  Keep all follow-up appointments with your caregiver. SEEK MEDICAL CARE IF:  You have abdominal pain and it increases or stays in one area (localizes).  You have a rash, stiff neck, or severe headache.  You are irritable, sleepy, or difficult to awaken.  You are weak, dizzy, or extremely thirsty. SEEK IMMEDIATE MEDICAL CARE IF:   You are unable to keep fluids down or you get worse despite treatment.  You have frequent episodes of vomiting or diarrhea.  You have blood or green matter (bile) in your vomit.  You have blood in your stool or your stool looks black and tarry.  You have not urinated in 6 to 8 hours, or you have only urinated a small amount of very dark urine.  You have a fever.  You faint. MAKE SURE YOU:   Understand these instructions.  Will watch your condition.  Will get help right away if you are not doing well or get worse. Document Released: 08/28/2005 Document Revised: 11/20/2011 Document Reviewed: 04/17/2011 ExitCare Patient Information 2015 [employer]. This information is not intended to replace advice given to you by your health care provider. Make sure you discuss any questions you have with your health care   provider.  

## 2014-11-10 NOTE — [facility] Oncology Navigator Note (Signed)
Visited patient in treatment area as her IV fluids were going. She reports no diarrhea since early this afternoon and her nausea is better. Reinforced pushing fluids and being aggressive and intentional with her antidiarrhea meds. Suggested she keep note pad to document the doses she is taking and times. She is concerned that it is not convenient for her sister to bring her in, although her sister reports having no difficulty bringing her in. She will return for fluids again tomorrow. She will pick up her Lomotil and Compazine scripts on the way home.

## 2014-11-10 NOTE — Telephone Encounter (Signed)
Called Martha Becker to check on her: Tells nurse "I can't go on like this for 6 months, I can't do it". Reassured her that then next 6 months will not be this difficult for her. Will make adjustments to her antiemetic regimen with next treatment. She has been on the bed or sofa for last 2 days and feels "terrible". Had diarrhea X 2 yesterday during the day and she took #2 Imodium after each. Then had another at 8 pm and she repeated the Imodium. Did not get up during the night to have BM. Had loose stool today and has taken #2 Imodium and ate some toast and took her nausea med. Says she is trying to drink and eat, but "nothing tastes good". Informed her she has most likely become dehydrated and probably needs lab work , to be seen and IV fluids. She says she can't drive and has no transportation-suggested she call her sister to bring her. Her recovery is going to take longer if she does not help [place] get her better. She will call her sister and see if she can bring her in today.

## 2014-11-10 NOTE — Progress Notes (Signed)
Dr. Benay Spice notified of pt's BP, nausea rx's clarified. Okay to discharge patient home. Pt verbalized understanding regarding her nausea rx.

## 2014-11-10 NOTE — Telephone Encounter (Signed)
Martha Becker agrees to come to office today. Her sister will drive her and hope to get her here by 2 pm today. Will draw stat labs and see MD w/probable IV fluids.

## 2014-11-11 ENCOUNTER — Ambulatory Visit (HOSPITAL_BASED_OUTPATIENT_CLINIC_OR_DEPARTMENT_OTHER): Payer: 59

## 2014-11-11 ENCOUNTER — Other Ambulatory Visit: Payer: Self-pay | Admitting: *Deleted

## 2014-11-11 ENCOUNTER — Ambulatory Visit (HOSPITAL_COMMUNITY)
Admission: RE | Admit: 2014-11-11 | Discharge: 2014-11-11 | Disposition: A | Discharge status: Home / Self Care | Payer: 59 | Source: Ambulatory Visit | Attending: Oncology | Admitting: Oncology

## 2014-11-11 VITALS — BP 119/67 | HR 72 | Temp 98.3°F

## 2014-11-11 DIAGNOSIS — C182 Malignant neoplasm of ascending colon: Secondary | ICD-10-CM

## 2014-11-11 DIAGNOSIS — R110 Nausea: Secondary | ICD-10-CM

## 2014-11-11 DIAGNOSIS — C180 Malignant neoplasm of cecum: Secondary | ICD-10-CM

## 2014-11-11 LAB — CLOSTRIDIUM DIFFICILE BY PCR: Toxigenic C. Difficile by PCR: NEGATIVE

## 2014-11-11 MED ORDER — DEXAMETHASONE SODIUM PHOSPHATE 10 MG/ML IJ SOLN
INTRAMUSCULAR | Status: AC
  Filled 2014-11-11: qty 1

## 2014-11-11 MED ORDER — SODIUM CHLORIDE 0.9 % IV SOLN
Freq: Once | INTRAVENOUS | Status: AC
  Administered 2014-11-11: 15:00:00 via INTRAVENOUS

## 2014-11-11 MED ORDER — DEXAMETHASONE SODIUM PHOSPHATE 10 MG/ML IJ SOLN
10.0000 mg | Freq: Once | INTRAMUSCULAR | Status: AC
  Administered 2014-11-11: 10 mg via INTRAVENOUS

## 2014-11-11 NOTE — Patient Instructions (Signed)

## 2014-11-13 ENCOUNTER — Telehealth: Payer: Self-pay

## 2014-11-13 ENCOUNTER — Telehealth: Payer: Self-pay | Admitting: *Deleted

## 2014-11-13 NOTE — Telephone Encounter (Signed)
Left message requesting pt call office. Need to be sure she is maximizing the Lomotil QID and also taking Imodium (up to 8 tablets per day). Pt needs to continue to push fluids. May need to repeat CDiff testing.

## 2014-11-13 NOTE — Telephone Encounter (Signed)
Patient complains of diarrhea and abdominal cramping, stating that Lomotil and increasing her fluid intake is not working; advised patient to come in today to be seen by Selena Lesser, NP in symptom management clinic. Patient refused to come in today to be seen.  Message left for collaborative nurse.  Patient advised to go to ED if symptoms worsen.

## 2014-11-13 NOTE — Telephone Encounter (Signed)
Pt returned call, she reports she has not had a loose stool since 1230 AM. Took #2 Lomotil then. Pt has not eaten anything today. Has had about 38 oz of liquids. Instructed her to try dry toast. Take #2 Lomotil if she has a loose stool. Will call pt later today to follow up.

## 2014-11-14 ENCOUNTER — Inpatient Hospital Stay (HOSPITAL_COMMUNITY)
Admission: EM | Admit: 2014-11-14 | Discharge: 2014-11-18 | DRG: 392 | Disposition: A | Discharge status: Home / Self Care | Payer: 59 | Attending: Internal Medicine | Admitting: Internal Medicine

## 2014-11-14 ENCOUNTER — Encounter (HOSPITAL_COMMUNITY): Payer: Self-pay | Admitting: Emergency Medicine

## 2014-11-14 ENCOUNTER — Emergency Department (HOSPITAL_COMMUNITY): Payer: 59

## 2014-11-14 DIAGNOSIS — E871 Hypo-osmolality and hyponatremia: Secondary | ICD-10-CM | POA: Diagnosis present

## 2014-11-14 DIAGNOSIS — K529 Noninfective gastroenteritis and colitis, unspecified: Secondary | ICD-10-CM | POA: Diagnosis not present

## 2014-11-14 DIAGNOSIS — K219 Gastro-esophageal reflux disease without esophagitis: Secondary | ICD-10-CM | POA: Diagnosis present

## 2014-11-14 DIAGNOSIS — Z9049 Acquired absence of other specified parts of digestive tract: Secondary | ICD-10-CM | POA: Diagnosis present

## 2014-11-14 DIAGNOSIS — E860 Dehydration: Secondary | ICD-10-CM | POA: Diagnosis present

## 2014-11-14 DIAGNOSIS — Z85038 Personal history of other malignant neoplasm of large intestine: Secondary | ICD-10-CM

## 2014-11-14 DIAGNOSIS — E870 Hyperosmolality and hypernatremia: Secondary | ICD-10-CM | POA: Diagnosis present

## 2014-11-14 DIAGNOSIS — R197 Diarrhea, unspecified: Secondary | ICD-10-CM | POA: Diagnosis not present

## 2014-11-14 DIAGNOSIS — C189 Malignant neoplasm of colon, unspecified: Secondary | ICD-10-CM | POA: Diagnosis present

## 2014-11-14 DIAGNOSIS — Z87891 Personal history of nicotine dependence: Secondary | ICD-10-CM

## 2014-11-14 DIAGNOSIS — R110 Nausea: Secondary | ICD-10-CM

## 2014-11-14 DIAGNOSIS — R509 Fever, unspecified: Secondary | ICD-10-CM

## 2014-11-14 DIAGNOSIS — C787 Secondary malignant neoplasm of liver and intrahepatic bile duct: Secondary | ICD-10-CM

## 2014-11-14 DIAGNOSIS — E876 Hypokalemia: Secondary | ICD-10-CM | POA: Diagnosis present

## 2014-11-14 DIAGNOSIS — Z885 Allergy status to narcotic agent status: Secondary | ICD-10-CM

## 2014-11-14 LAB — MAGNESIUM: Magnesium: 1.4 mg/dL — ABNORMAL LOW (ref 1.5–2.5)

## 2014-11-14 LAB — CBC WITH DIFFERENTIAL/PLATELET
BASOS PCT: 0 % (ref 0–1)
Basophils Absolute: 0 10*3/uL (ref 0.0–0.1)
EOS PCT: 0 % (ref 0–5)
Eosinophils Absolute: 0 10*3/uL (ref 0.0–0.7)
HEMATOCRIT: 33.2 % — AB (ref 36.0–46.0)
Hemoglobin: 11.4 g/dL — ABNORMAL LOW (ref 12.0–15.0)
Lymphocytes Relative: 13 % (ref 12–46)
Lymphs Abs: 1.1 10*3/uL (ref 0.7–4.0)
MCH: 27.9 pg (ref 26.0–34.0)
MCHC: 34.3 g/dL (ref 30.0–36.0)
MCV: 81.2 fL (ref 78.0–100.0)
MONOS PCT: 27 % — AB (ref 3–12)
Monocytes Absolute: 2.2 10*3/uL — ABNORMAL HIGH (ref 0.1–1.0)
NEUTROS PCT: 60 % (ref 43–77)
Neutro Abs: 4.8 10*3/uL (ref 1.7–7.7)
Platelets: 277 10*3/uL (ref 150–400)
RBC: 4.09 MIL/uL (ref 3.87–5.11)
RDW: 14.3 % (ref 11.5–15.5)
WBC: 8.2 10*3/uL (ref 4.0–10.5)

## 2014-11-14 LAB — COMPREHENSIVE METABOLIC PANEL
ALT: 12 U/L (ref 0–35)
ANION GAP: 9 (ref 5–15)
AST: 15 U/L (ref 0–37)
Albumin: 2.6 g/dL — ABNORMAL LOW (ref 3.5–5.2)
Alkaline Phosphatase: 56 U/L (ref 39–117)
BUN: 7 mg/dL (ref 6–23)
CO2: 28 mmol/L (ref 19–32)
Calcium: 8 mg/dL — ABNORMAL LOW (ref 8.4–10.5)
Chloride: 91 mmol/L — ABNORMAL LOW (ref 96–112)
Creatinine, Ser: 0.74 mg/dL (ref 0.50–1.10)
GFR calc Af Amer: >90 mL/min (ref >90)
GFR calc non Af Amer: >90 mL/min (ref >90)
GLUCOSE: 108 mg/dL — AB (ref 70–99)
Potassium: 2.4 mmol/L — CL (ref 3.5–5.1)
SODIUM: 128 mmol/L — AB (ref 135–145)
Total Bilirubin: 0.5 mg/dL (ref 0.3–1.2)
Total Protein: 5.4 g/dL — ABNORMAL LOW (ref 6.0–8.3)

## 2014-11-14 LAB — URINALYSIS, ROUTINE W REFLEX MICROSCOPIC
BILIRUBIN URINE: NEGATIVE
Glucose, UA: NEGATIVE mg/dL
Leukocytes, UA: NEGATIVE
NITRITE: NEGATIVE
PROTEIN: NEGATIVE mg/dL
Specific Gravity, Urine: <1.005 — ABNORMAL LOW (ref 1.005–1.030)
Urobilinogen, UA: 0.2 mg/dL (ref 0.0–1.0)
pH: 6.5 (ref 5.0–8.0)

## 2014-11-14 LAB — LACTIC ACID, PLASMA: Lactic Acid, Venous: 1.2 mmol/L (ref 0.5–2.0)

## 2014-11-14 LAB — LIPASE, BLOOD: Lipase: 43 U/L (ref 11–59)

## 2014-11-14 LAB — URINE MICROSCOPIC-ADD ON

## 2014-11-14 MED ORDER — ACETAMINOPHEN 500 MG PO TABS
1000.0000 mg | ORAL_TABLET | Freq: Once | ORAL | Status: AC
  Administered 2014-11-14: 1000 mg via ORAL
  Filled 2014-11-14: qty 2

## 2014-11-14 MED ORDER — RISAQUAD PO CAPS
1.0000 | ORAL_CAPSULE | Freq: Two times a day (BID) | ORAL | Status: DC
  Administered 2014-11-15 – 2014-11-18 (×7): 1 via ORAL
  Filled 2014-11-14 (×7): qty 1

## 2014-11-14 MED ORDER — ONDANSETRON HCL 4 MG/2ML IJ SOLN
4.0000 mg | INTRAMUSCULAR | Status: AC | PRN
  Administered 2014-11-14 (×2): 4 mg via INTRAVENOUS
  Filled 2014-11-14 (×2): qty 2

## 2014-11-14 MED ORDER — ALUM & MAG HYDROXIDE-SIMETH 200-200-20 MG/5ML PO SUSP: 30.0000 mL | Freq: Four times a day (QID) | ORAL | Status: DC | PRN

## 2014-11-14 MED ORDER — ENOXAPARIN SODIUM 40 MG/0.4ML ~~LOC~~ SOLN
40.0000 mg | SUBCUTANEOUS | Status: DC
  Administered 2014-11-15 – 2014-11-18 (×4): 40 mg via SUBCUTANEOUS
  Filled 2014-11-14 (×4): qty 0.4

## 2014-11-14 MED ORDER — ACETAMINOPHEN 650 MG RE SUPP: 650.0000 mg | Freq: Four times a day (QID) | RECTAL | Status: DC | PRN

## 2014-11-14 MED ORDER — IOHEXOL 300 MG/ML  SOLN
100.0000 mL | Freq: Once | INTRAMUSCULAR | Status: AC | PRN
  Administered 2014-11-14: 100 mL via INTRAVENOUS

## 2014-11-14 MED ORDER — IOHEXOL 300 MG/ML  SOLN
25.0000 mL | Freq: Once | INTRAMUSCULAR | Status: AC | PRN
  Administered 2014-11-14: 25 mL via ORAL

## 2014-11-14 MED ORDER — ACETAMINOPHEN 325 MG PO TABS
650.0000 mg | ORAL_TABLET | Freq: Four times a day (QID) | ORAL | Status: DC | PRN
  Administered 2014-11-17: 650 mg via ORAL
  Filled 2014-11-14: qty 2

## 2014-11-14 MED ORDER — METRONIDAZOLE IN NACL 5-0.79 MG/ML-% IV SOLN
500.0000 mg | Freq: Once | INTRAVENOUS | Status: AC
  Administered 2014-11-14: 500 mg via INTRAVENOUS
  Filled 2014-11-14: qty 100

## 2014-11-14 MED ORDER — MAGNESIUM SULFATE 2 GM/50ML IV SOLN
2.0000 g | Freq: Once | INTRAVENOUS | Status: AC
  Administered 2014-11-14: 2 g via INTRAVENOUS
  Filled 2014-11-14: qty 50

## 2014-11-14 MED ORDER — SODIUM CHLORIDE 0.9 % IV BOLUS (SEPSIS)
500.0000 mL | Freq: Once | INTRAVENOUS | Status: AC
Start: 2014-11-14 — End: 2014-11-14
  Administered 2014-11-14: 500 mL via INTRAVENOUS

## 2014-11-14 MED ORDER — PROCHLORPERAZINE MALEATE 5 MG PO TABS
10.0000 mg | ORAL_TABLET | Freq: Four times a day (QID) | ORAL | Status: DC | PRN
  Administered 2014-11-15 – 2014-11-16 (×5): 10 mg via ORAL
  Filled 2014-11-14 (×5): qty 2

## 2014-11-14 MED ORDER — POTASSIUM CHLORIDE CRYS ER 20 MEQ PO TBCR
40.0000 meq | EXTENDED_RELEASE_TABLET | Freq: Two times a day (BID) | ORAL | Status: DC
  Administered 2014-11-15 (×2): 40 meq via ORAL
  Filled 2014-11-14 (×2): qty 2

## 2014-11-14 MED ORDER — POTASSIUM CHLORIDE 10 MEQ/100ML IV SOLN
10.0000 meq | Freq: Once | INTRAVENOUS | Status: AC
  Administered 2014-11-14: 10 meq via INTRAVENOUS
  Filled 2014-11-14: qty 100

## 2014-11-14 MED ORDER — DOCUSATE SODIUM 100 MG PO CAPS: 100.0000 mg | ORAL_CAPSULE | Freq: Every day | ORAL | Status: DC | PRN

## 2014-11-14 MED ORDER — OXYCODONE-ACETAMINOPHEN 5-325 MG PO TABS
1.0000 | ORAL_TABLET | ORAL | Status: DC | PRN
  Administered 2014-11-15: 2 via ORAL
  Administered 2014-11-15 – 2014-11-16 (×4): 1 via ORAL
  Filled 2014-11-14: qty 1
  Filled 2014-11-14: qty 2
  Filled 2014-11-14: qty 1
  Filled 2014-11-14: qty 2
  Filled 2014-11-14 (×2): qty 1

## 2014-11-14 MED ORDER — POTASSIUM CHLORIDE CRYS ER 20 MEQ PO TBCR
40.0000 meq | EXTENDED_RELEASE_TABLET | Freq: Once | ORAL | Status: AC
  Administered 2014-11-14: 40 meq via ORAL
  Filled 2014-11-14: qty 2

## 2014-11-14 MED ORDER — ONDANSETRON HCL 4 MG/2ML IJ SOLN
INTRAMUSCULAR | Status: AC
  Filled 2014-11-14: qty 2

## 2014-11-14 MED ORDER — CIPROFLOXACIN IN D5W 400 MG/200ML IV SOLN
400.0000 mg | Freq: Two times a day (BID) | INTRAVENOUS | Status: DC
  Administered 2014-11-14 – 2014-11-18 (×8): 400 mg via INTRAVENOUS
  Filled 2014-11-14 (×8): qty 200

## 2014-11-14 MED ORDER — ONDANSETRON HCL 4 MG/2ML IJ SOLN
4.0000 mg | Freq: Once | INTRAMUSCULAR | Status: AC
  Administered 2014-11-14: 4 mg via INTRAMUSCULAR

## 2014-11-14 MED ORDER — SODIUM CHLORIDE 0.9 % IV SOLN
INTRAVENOUS | Status: DC
  Administered 2014-11-14: 18:00:00 via INTRAVENOUS

## 2014-11-14 MED ORDER — METRONIDAZOLE IN NACL 5-0.79 MG/ML-% IV SOLN
500.0000 mg | Freq: Three times a day (TID) | INTRAVENOUS | Status: DC
  Administered 2014-11-15 – 2014-11-18 (×10): 500 mg via INTRAVENOUS
  Filled 2014-11-14 (×10): qty 100

## 2014-11-14 MED ORDER — POTASSIUM CHLORIDE IN NACL 40-0.9 MEQ/L-% IV SOLN
INTRAVENOUS | Status: DC
  Administered 2014-11-15 – 2014-11-16 (×5): 125 mL/h via INTRAVENOUS

## 2014-11-14 NOTE — ED Provider Notes (Signed)
CSN: [id number]     Arrival date & time 11/14/14  1641 History   First MD Initiated Contact with Patient 11/14/14 1703     Chief Complaint  Patient presents with  . Weakness  . Abdominal Pain  . Diarrhea  . Nausea      HPI Pt was seen at 1715. Per pt, c/o gradual onset and persistence of constant generalized fatigue, nausea, multiple daily episodes of diarrhea, and generalized abd "cramping" for the past 1.5 weeks. Pt states she had a dose of chemo on 10/29/14 and developed generalized fatigue a few days later. Pt then developed nausea and multiple daily stools approximately 5 days after her chemo treatment. Pt was evaluated by her Heme/Onc MD x2 for same, told her "labs were ok" and "I didn't have cdiff." Pt states she was told to maximize her dosing of lomotil and imodium today, which stopped her diarrhea, but increased her abd cramping. Denies home fevers, no CP/SOB, no cough, no vomiting, no back pain, no rash, no black or blood in stools.    Past Medical History  Diagnosis Date  . Colon cancer    Past Surgical History  Procedure Laterality Date  . Cesarean section    . Partial colectomy N/A 08/31/2014    Procedure: Right Colon Resection ;  Surgeon: Jamesetta So, MD;  Location: [facility];  Service: General;  Laterality: N/A;  . Liver biopsy N/A 08/31/2014    Procedure: LIVER BIOPSY;  Surgeon: Jamesetta So, MD;  Location: [facility];  Service: General;  Laterality: N/A;   Family History  Problem Relation Age of Onset  . Colon cancer Cousin     age late 8s  . Inflammatory bowel disease Neg Hx    History  Substance Use Topics  . Smoking status: Former Smoker -- 1.50 packs/day for 18 years    Types: Cigarettes    Quit date: 09/15/1994  . Smokeless tobacco: Not on file  . Alcohol Use: 0.6 oz/week    1 Glasses of wine, 0 Standard drinks or equivalent per week     Comment: Maybe 1 glass a week   OB History    Gravida Para Term Preterm AB TAB SAB Ectopic Multiple Living   2          1     Review of Systems ROS: Statement: All systems negative except as marked or noted in the HPI; Constitutional: Negative for fever and chills. +generalized weakness/fatigue. ; ; Eyes: Negative for eye pain, redness and discharge. ; ; ENMT: Negative for ear pain, hoarseness, nasal congestion, sinus pressure and sore throat. ; ; Cardiovascular: Negative for chest pain, palpitations, diaphoresis, dyspnea and peripheral edema. ; ; Respiratory: Negative for cough, wheezing and stridor. ; ; Gastrointestinal: +nausea, diarrhea, abd pain. Negative for vomiting, blood in stool, hematemesis, jaundice and rectal bleeding. . ; ; Genitourinary: Negative for dysuria, flank pain and hematuria. ; ; Musculoskeletal: Negative for back pain and neck pain. Negative for swelling and trauma.; ; Skin: Negative for pruritus, rash, abrasions, blisters, bruising and skin lesion.; ; Neuro: Negative for headache, lightheadedness and neck stiffness. Negative for altered level of consciousness , altered mental status, extremity weakness, paresthesias, involuntary movement, seizure and syncope.     Allergies  Codeine  Home Medications   Prior to Admission medications   Medication Sig Start Date End Date Taking? Authorizing Provider  diphenoxylate-atropine (LOMOTIL) 2.5-0.025 MG per tablet Take 1 tablet by mouth 4 (four) times daily as needed for diarrhea or  loose stools. 11/10/14  Yes Ladell Pier, MD  loperamide (IMODIUM) 2 MG capsule Take 2-4 mg by mouth as needed for diarrhea or loose stools.   Yes Historical Provider, MD  ondansetron (ZOFRAN) 4 MG tablet Take 1 tablet (4 mg total) by mouth every 8 (eight) hours as needed for nausea or vomiting. 09/04/14  Yes Jamesetta So, MD  oxyCODONE-acetaminophen (PERCOCET) 7.5-325 MG per tablet Take 1-2 tablets by mouth every 4 (four) hours as needed. 09/03/14  Yes Jamesetta So, MD  prochlorperazine (COMPAZINE) 10 MG tablet Take 1 tablet (10 mg total) by mouth every 6 (six)  hours as needed for nausea or vomiting. 11/06/14  Yes Ladell Pier, MD  B Complex-C-E-Zn (B COMPLEX-C-E-ZINC) tablet Take 1 tablet by mouth daily.    Historical Provider, MD  Calcium Carb-Cholecalciferol (CALCIUM + D3) 600-200 MG-UNIT TABS Take 1 tablet by mouth daily. 1-2 tablets (1200-2400mg  daily)    Historical Provider, MD  capecitabine (XELODA) 500 MG tablet Take 4 tabs (2,000 mg) PO QAM 3 tabs (1,500 mg) QPM for 14 days then 7 days off; 3 week cycle. Patient not taking: Reported on 11/10/2014 10/29/14   Ladell Pier, MD  iron polysaccharides (NIFEREX) 150 MG capsule Take 1 capsule (150 mg total) by mouth daily. 09/16/14   Molli Hazard, MD  Magnesium 200 MG TABS Take 200 mg by mouth daily.    Historical Provider, MD  Multiple Vitamin (STRESS B PO) Take by mouth.    Historical Provider, MD  Omega-3 Fatty Acids (FISH OIL) 1200 MG CPDR Take 1,200 mg by mouth daily.    Historical Provider, MD  Probiotic Product (PROBIOTIC DAILY PO) Take 1 tablet by mouth daily.     Historical Provider, MD  Selenium 200 MCG CAPS Take by mouth.    Historical Provider, MD  vitamin B-12 (CYANOCOBALAMIN) 1000 MCG tablet Take 1,000 mcg by mouth daily.    Historical Provider, MD   BP 110/66 mmHg  Pulse 72  Temp(Src) 99.8 F (37.7 C) (Oral)  Resp 16  Ht 5\' 6"  (1.676 m)  Wt 140 lb (63.504 kg)  BMI 22.61 kg/m2  SpO2 97%  Filed Vitals:   11/14/14 1830 11/14/14 1900 11/14/14 1930 11/14/14 1946  BP: 117/77 107/71 110/66 110/66  Pulse: 77  76 72  Temp:    99.8 F (37.7 C)  TempSrc:    Oral  Resp:    16  Height:      Weight:      SpO2: 99%  98% 97%      Physical Exam  1720; Physical examination:  Nursing notes reviewed; Vital signs and O2 SAT reviewed; +febrile.;; Constitutional: Well developed, Well nourished, appears fatigued. In no acute distress; Head:  Normocephalic, atraumatic; Eyes: EOMI, PERRL, No scleral icterus; ENMT: Mouth and pharynx normal, Mucous membranes dry; Neck: Supple, Full  range of motion, No lymphadenopathy; Cardiovascular: Regular rate and rhythm, No gallop; Respiratory: Breath sounds clear & equal bilaterally, No rales, rhonchi, wheezes.  Speaking full sentences with ease, Normal respiratory effort/excursion; Chest: Nontender, Movement normal; Abdomen: Soft, +diffuse tenderness to palp. No rebound or guarding. Nondistended, Normal bowel sounds; Genitourinary: No CVA tenderness; Extremities: Pulses normal, No tenderness, No edema, No calf edema or asymmetry.; Neuro: AA&Ox3, Major CN grossly intact.  Speech clear. No gross focal motor or sensory deficits in extremities.; Skin: Color normal, Warm, Dry.   ED Course  Procedures     EKG Interpretation None      MDM  MDM Reviewed:  previous chart, nursing note and vitals Reviewed previous: labs Interpretation: labs, CT scan and x-ray Total time providing critical care: 30-74 minutes. This excludes time spent performing separately reportable procedures and services. Consults: admitting MD     CRITICAL CARE Performed by: Alfonzo Feller Total critical care time: 35 Critical care time was exclusive of separately billable procedures and treating other patients. Critical care was necessary to treat or prevent imminent or life-threatening deterioration. Critical care was time spent personally by me on the following activities: development of treatment plan with patient and/or surrogate as well as nursing, discussions with consultants, evaluation of patient's response to treatment, examination of patient, obtaining history from patient or surrogate, ordering and performing treatments and interventions, ordering and review of laboratory studies, ordering and review of radiographic studies, pulse oximetry and re-evaluation of patient's condition.   Results for orders placed or performed during the hospital encounter of 11/14/14  CBC with Differential  Result Value Ref Range   WBC 8.2 4.0 - 10.5 K/uL   RBC 4.09  3.87 - 5.11 MIL/uL   Hemoglobin 11.4 (L) 12.0 - 15.0 g/dL   HCT 33.2 (L) 36.0 - 46.0 %   MCV 81.2 78.0 - 100.0 fL   MCH 27.9 26.0 - 34.0 pg   MCHC 34.3 30.0 - 36.0 g/dL   RDW 14.3 11.5 - 15.5 %   Platelets 277 150 - 400 K/uL   Neutrophils Relative % 60 43 - 77 %   Neutro Abs 4.8 1.7 - 7.7 K/uL   Lymphocytes Relative 13 12 - 46 %   Lymphs Abs 1.1 0.7 - 4.0 K/uL   Monocytes Relative 27 (H) 3 - 12 %   Monocytes Absolute 2.2 (H) 0.1 - 1.0 K/uL   Eosinophils Relative 0 0 - 5 %   Eosinophils Absolute 0.0 0.0 - 0.7 K/uL   Basophils Relative 0 0 - 1 %   Basophils Absolute 0.0 0.0 - 0.1 K/uL  Comprehensive metabolic panel  Result Value Ref Range   Sodium 128 (L) 135 - 145 mmol/L   Potassium 2.4 (LL) 3.5 - 5.1 mmol/L   Chloride 91 (L) 96 - 112 mmol/L   CO2 28 19 - 32 mmol/L   Glucose, Bld 108 (H) 70 - 99 mg/dL   BUN 7 6 - 23 mg/dL   Creatinine, Ser 0.74 0.50 - 1.10 mg/dL   Calcium 8.0 (L) 8.4 - 10.5 mg/dL   Total Protein 5.4 (L) 6.0 - 8.3 g/dL   Albumin 2.6 (L) 3.5 - 5.2 g/dL   AST 15 0 - 37 U/L   ALT 12 0 - 35 U/L   Alkaline Phosphatase 56 39 - 117 U/L   Total Bilirubin 0.5 0.3 - 1.2 mg/dL   GFR calc non Af Amer >90 >90 mL/min   GFR calc Af Amer >90 >90 mL/min   Anion gap 9 5 - 15  Urinalysis, Routine w reflex microscopic  Result Value Ref Range   Color, Urine YELLOW YELLOW   APPearance CLEAR CLEAR   Specific Gravity, Urine <1.005 (L) 1.005 - 1.030   pH 6.5 5.0 - 8.0   Glucose, UA NEGATIVE NEGATIVE mg/dL   Hgb urine dipstick TRACE (A) NEGATIVE   Bilirubin Urine NEGATIVE NEGATIVE   Ketones, ur TRACE (A) NEGATIVE mg/dL   Protein, ur NEGATIVE NEGATIVE mg/dL   Urobilinogen, UA 0.2 0.0 - 1.0 mg/dL   Nitrite NEGATIVE NEGATIVE   Leukocytes, UA NEGATIVE NEGATIVE  Lipase, blood  Result Value Ref Range   Lipase 43  11 - 59 U/L  Lactic acid, plasma  Result Value Ref Range   Lactic Acid, Venous 1.2 0.5 - 2.0 mmol/L  Magnesium  Result Value Ref Range   Magnesium 1.4 (L) 1.5 - 2.5  mg/dL  Urine microscopic-add on  Result Value Ref Range   Squamous Epithelial / LPF RARE RARE   WBC, UA 0-2 <3 WBC/hpf   RBC / HPF 3-6 <3 RBC/hpf   Bacteria, UA RARE RARE   Ct Abdomen Pelvis W Contrast 11/14/2014   CLINICAL DATA:  Worsening weakness, with nausea, vomiting and diarrhea. Current history of colon cancer with metastatic disease, on chemotherapy. Initial encounter.  EXAM: CT ABDOMEN AND PELVIS WITH CONTRAST  TECHNIQUE: Multidetector CT imaging of the abdomen and pelvis was performed using the standard protocol following bolus administration of intravenous contrast.  CONTRAST:  26mL OMNIPAQUE IOHEXOL 300 MG/ML SOLN, 154mL OMNIPAQUE IOHEXOL 300 MG/ML SOLN  COMPARISON:  PET/CT performed 09/22/2014  FINDINGS: Scattered pulmonary nodules at the lung bases are stable or mildly worsened from the prior study, with a few new pulmonary nodules seen. This is compatible with metastatic disease.  Multiple hepatic masses are seen, measuring up to 3.7 cm. These appear mostly new from the prior study. This is compatible with metastatic disease. The liver and spleen are unremarkable in appearance. The pancreas and adrenal glands are within normal limits.  Retroperitoneal lymphadenopathy is apparently mildly improved from the prior study. Matted lymphadenopathy still measures up to 4.0 cm in size.  The kidneys are unremarkable in appearance. There is no evidence of hydronephrosis. No renal or ureteral stones are seen. No perinephric stranding is appreciated.  There is diffuse wall thickening and soft tissue inflammation along the mid to distal ileum, to the level of the patient's ileocolic anastomosis at the mid abdomen. This is compatible with acute infectious or inflammatory ileitis. Associated prominent mesenteric nodes may reflect the acute infectious or inflammatory process, or possibly metastatic disease.  The more proximal small bowel is unremarkable in appearance. The stomach is within normal limits. No  acute vascular abnormalities are seen.  The remaining colon is partially filled with fluid and air, and is unremarkable in appearance. The sigmoid colon is mildly redundant.  The bladder is moderately distended grossly remarkable in appearance. The uterus is grossly unremarkable. The ovaries are relatively symmetric. No suspicious adnexal masses are seen. No inguinal lymphadenopathy is seen.  No acute osseous abnormalities are identified.  IMPRESSION: 1. Acute infectious or inflammatory ileitis involving the mid distal ileum, with diffuse wall thickening and soft tissue inflammation, to the level of the patient's ileocolic anastomosis. 2. Associated prominent mesenteric nodes may reflect the acute infectious or inflammatory process, or possibly metastatic disease. 3. Multiple hepatic masses, measuring up to 3.7 cm. These are mostly new from the prior study and compatible with metastatic disease. 4. Scattered pulmonary nodules at the lung bases are stable or mildly worsened from the prior study, compatible with metastatic disease. 5. Retroperitoneal lymphadenopathy is mildly improved from prior study. Matted lymphadenopathy still measures up to 4.0 cm in size.   Electronically Signed   By: Garald Balding M.D.   On: 11/14/2014 21:00    2050:  Pt stooled a very small amount while in the ED; heme negative. No vomiting while in the ED. New hyponatremia on labs; will continue judicious IVF. Potassium and magnesium repleted IV.  APAP given for fever with improvement.  CT san with ileitis; will dose IV cipro and flagyl. BC and UC have already been  obtained. Dx and testing d/w pt and family.  Questions answered.  Verb understanding, agreeable to admit. T/C to [facility] Dr. Nehemiah Settle, case discussed, including:  HPI, pertinent PM/SHx, VS/PE, dx testing, ED course and treatment:  Agreeable to admit, requests he will come to the ED for evaluation.     Francine Graven, DO 11/15/14 2150

## 2014-11-14 NOTE — ED Notes (Signed)
PT states last chemo treatment on 10/29/14 and starting having increased weakness with n/v/d x1 week. PT stated she has been to [facility] for fluids on 11/10/14 and 11/12/14.

## 2014-11-14 NOTE — ED Notes (Signed)
Brother Maricela Curet, [phone] Call anytime day or night if any information need or to update on patient care.

## 2014-11-14 NOTE — ED Notes (Signed)
CRITICAL VALUE ALERT  Critical value received:  K+ 2.4  Date of notification:  11/14/14  Time of notification:  1802  Critical value read back:Yes.    Nurse who received alert:  Corene Cornea, RN  MD notified (1st page):    Time of first page:    MD notified (2nd page):  Time of second page:  Responding MD:  Dr. Ples Specter  Time MD responded:  [phone]

## 2014-11-14 NOTE — Progress Notes (Signed)
ANTIBIOTIC CONSULT NOTE  Pharmacy Consult for Cipro Indication: Colitis  Allergies  Allergen Reactions  . Codeine Nausea And Vomiting    Patient Measurements: Height: 5\' 6"  (167.6 cm) Weight: 140 lb (63.504 kg) IBW/kg (Calculated) : 59.3  Vital Signs: Temp: 99.8 F (37.7 C) (03/05 1946) Temp Source: Oral (03/05 1946) BP: 110/66 mmHg (03/05 1946) Pulse Rate: 72 (03/05 1946) Intake/Output from previous day:   Intake/Output from this shift:    Labs:  Recent Labs  11/14/14 1709  WBC 8.2  HGB 11.4*  PLT 277  CREATININE 0.74   Estimated Creatinine Clearance: 73.5 mL/min (by C-G formula based on Cr of 0.74). No results for input(s): VANCOTROUGH, VANCOPEAK, VANCORANDOM, GENTTROUGH, GENTPEAK, GENTRANDOM, TOBRATROUGH, TOBRAPEAK, TOBRARND, AMIKACINPEAK, AMIKACINTROU, AMIKACIN in the last 72 hours.   Microbiology: Recent Results (from the past 720 hour(s))  Clostridium Difficile by PCR     Status: None   Collection Time: 11/11/14  3:31 PM  Result Value Ref Range Status   C difficile by pcr NEGATIVE NEGATIVE Final    Anti-infectives    Start     Dose/Rate Route Frequency Ordered Stop   11/14/14 2100  ciprofloxacin (CIPRO) IVPB 400 mg     400 mg 200 mL/hr over 60 Minutes Intravenous Every 12 hours 11/14/14 2047     11/14/14 2045  metroNIDAZOLE (FLAGYL) IVPB 500 mg     500 mg 100 mL/hr over 60 Minutes Intravenous  Once 11/14/14 2036        Assessment: 59 yo F who has had persistant N/V/D since chemo treatment earlier this week.   She was febrile on admission.  Renal function is at patient's baseline.  Cipro 3/5>> Flagyl 3/5>>  Goal of Therapy:  Eradicate infection.  Plan:  Cipro 400mg  IV q12h Monitor renal function and cx data   Biagio Borg 11/14/2014,8:49 PM

## 2014-11-14 NOTE — ED Notes (Signed)
Negative for occult blood in stool.

## 2014-11-14 NOTE — H&P (Signed)
History and Physical  Martha Becker GYB:[id number] DOB: 1956/04/23 DOA: 11/14/2014  Referring physician: Dr Thurnell Garbe, ED physician PCP: [name], MD    Chief Complaint: Diarrhea  HPI: Martha Becker is a 59 y.o. female  With a history of colon cancer status post resection of the right colon in December 2015. She had her initial dose of chemotherapy on February 18. Starting 2 weeks ago the patient has had increasing and worsening watery, nonbloody diarrhea. She has been on Lomotil and Imodium without improvement. She estimates approximately 6-8 stools per day. Additionally, she has been having a lot of abdominal cramping in her right and left lower quadrants associated with her diarrhea. No other provoking or palliating factors.   Review of Systems:   Pt complains of fevers, chills, weakness, fatigue, nausea, light headedness, dizziness   Pt denies any chest pain, shortness of breath, headaches, blurred vision, palpitations, hematemesis, melena.  Review of systems are otherwise negative  Past Medical History  Diagnosis Date  . Colon cancer    Past Surgical History  Procedure Laterality Date  . Cesarean section    . Partial colectomy N/A 08/31/2014    Procedure: Right Colon Resection ;  Surgeon: Jamesetta So, MD;  Location: [facility];  Service: General;  Laterality: N/A;  . Liver biopsy N/A 08/31/2014    Procedure: LIVER BIOPSY;  Surgeon: Jamesetta So, MD;  Location: [facility];  Service: General;  Laterality: N/A;   Social History:  reports that she quit smoking about 20 years ago. Her smoking use included Cigarettes. She has a 27 pack-year smoking history. She does not have any smokeless tobacco history on file. She reports that she drinks about 0.6 oz of alcohol per week. She reports that she does not use illicit drugs. Patient lives at home & is able to participate in activities of daily living  Allergies  Allergen Reactions  . Codeine Nausea And Vomiting    Family History    Problem Relation Age of Onset  . Colon cancer Cousin     age late 1s  . Inflammatory bowel disease Neg Hx       Prior to Admission medications   Medication Sig Start Date End Date Taking? Authorizing Provider  diphenoxylate-atropine (LOMOTIL) 2.5-0.025 MG per tablet Take 1 tablet by mouth 4 (four) times daily as needed for diarrhea or loose stools. 11/10/14  Yes Ladell Pier, MD  loperamide (IMODIUM) 2 MG capsule Take 2-4 mg by mouth as needed for diarrhea or loose stools.   Yes Historical Provider, MD  ondansetron (ZOFRAN) 4 MG tablet Take 1 tablet (4 mg total) by mouth every 8 (eight) hours as needed for nausea or vomiting. 09/04/14  Yes Jamesetta So, MD  oxyCODONE-acetaminophen (PERCOCET) 7.5-325 MG per tablet Take 1-2 tablets by mouth every 4 (four) hours as needed. 09/03/14  Yes Jamesetta So, MD  prochlorperazine (COMPAZINE) 10 MG tablet Take 1 tablet (10 mg total) by mouth every 6 (six) hours as needed for nausea or vomiting. 11/06/14  Yes Ladell Pier, MD  B Complex-C-E-Zn (B COMPLEX-C-E-ZINC) tablet Take 1 tablet by mouth daily.    Historical Provider, MD  Calcium Carb-Cholecalciferol (CALCIUM + D3) 600-200 MG-UNIT TABS Take 1 tablet by mouth daily. 1-2 tablets (1200-2400mg  daily)    Historical Provider, MD  capecitabine (XELODA) 500 MG tablet Take 4 tabs (2,000 mg) PO QAM 3 tabs (1,500 mg) QPM for 14 days then 7 days off; 3 week cycle. Patient not taking: Reported on 11/10/2014  10/29/14   Ladell Pier, MD  iron polysaccharides (NIFEREX) 150 MG capsule Take 1 capsule (150 mg total) by mouth daily. 09/16/14   Molli Hazard, MD  Magnesium 200 MG TABS Take 200 mg by mouth daily.    Historical Provider, MD  Multiple Vitamin (STRESS B PO) Take by mouth.    Historical Provider, MD  Omega-3 Fatty Acids (FISH OIL) 1200 MG CPDR Take 1,200 mg by mouth daily.    Historical Provider, MD  Probiotic Product (PROBIOTIC DAILY PO) Take 1 tablet by mouth daily.     Historical Provider,  MD  Selenium 200 MCG CAPS Take by mouth.    Historical Provider, MD  vitamin B-12 (CYANOCOBALAMIN) 1000 MCG tablet Take 1,000 mcg by mouth daily.    Historical Provider, MD    Physical Exam: BP 110/66 mmHg  Pulse 72  Temp(Src) 99.8 F (37.7 C) (Oral)  Resp 16  Ht 5\' 6"  (1.676 m)  Wt 63.504 kg (140 lb)  BMI 22.61 kg/m2  SpO2 97%  General:  middle-aged Caucasian female. Awake and alert and oriented x3. No acute cardiopulmonary distress.  Eyes: Pupils equal, round, reactive to light. Extraocular muscles are intact. Sclerae anicteric and noninjected.  ENT: dry mucosal membranes. No mucosal lesions.  Neck: Neck supple without lymphadenopathy. No carotid bruits. No masses palpated.  Cardiovascular: Regular rate with normal S1-S2 sounds. No murmurs, rubs, gallops auscultated. No JVD.  Respiratory: Good respiratory effort with no wheezes, rales, rhonchi. Lungs clear to auscultation bilaterally.  Abdomen:  Soft, mild tenderness in the low left lower quadrant, non-distended. Active bowel sounds. No masses or hepatosplenomegaly  Skin: Dry, warm to touch. 2+ dorsalis pedis and radial pulses. Musculoskeletal: No calf or leg pain. All major joints not erythematous nontender.  Psychiatric: Intact judgment and insight.  Neurologic: No focal neurological deficits. Cranial nerves II through XII are grossly intact.           Labs on Admission:  Basic Metabolic Panel:  Recent Labs Lab 11/10/14 1428 11/14/14 1709 11/14/14 1831  NA 133* 128*  --   K 3.8 2.4*  --   CL  --  91*  --   CO2 23 28  --   GLUCOSE 101 108*  --   BUN 10.0 7  --   CREATININE 0.8 0.74  --   CALCIUM 9.0 8.0*  --   MG  --   --  1.4*   Liver Function Tests:  Recent Labs Lab 11/10/14 1428 11/14/14 1709  AST 13 15  ALT <6 12  ALKPHOS 75 56  BILITOT 0.36 0.5  PROT 5.6* 5.4*  ALBUMIN 2.9* 2.6*    Recent Labs Lab 11/14/14 1709  LIPASE 43   No results for input(s): AMMONIA in the last 168  hours. CBC:  Recent Labs Lab 11/10/14 1428 11/14/14 1709  WBC 11.1* 8.2  NEUTROABS 8.5* 4.8  HGB 11.7 11.4*  HCT 36.5 33.2*  MCV 84.6 81.2  PLT 323 277   Cardiac Enzymes: No results for input(s): CKTOTAL, CKMB, CKMBINDEX, TROPONINI in the last 168 hours.  BNP (last 3 results) No results for input(s): BNP in the last 8760 hours.  ProBNP (last 3 results) No results for input(s): PROBNP in the last 8760 hours.  CBG: No results for input(s): GLUCAP in the last 168 hours.  Radiological Exams on Admission: Ct Abdomen Pelvis W Contrast  11/14/2014   CLINICAL DATA:  Worsening weakness, with nausea, vomiting and diarrhea. Current history of colon cancer with metastatic  disease, on chemotherapy. Initial encounter.  EXAM: CT ABDOMEN AND PELVIS WITH CONTRAST  TECHNIQUE: Multidetector CT imaging of the abdomen and pelvis was performed using the standard protocol following bolus administration of intravenous contrast.  CONTRAST:  82mL OMNIPAQUE IOHEXOL 300 MG/ML SOLN, 153mL OMNIPAQUE IOHEXOL 300 MG/ML SOLN  COMPARISON:  PET/CT performed 09/22/2014  FINDINGS: Scattered pulmonary nodules at the lung bases are stable or mildly worsened from the prior study, with a few new pulmonary nodules seen. This is compatible with metastatic disease.  Multiple hepatic masses are seen, measuring up to 3.7 cm. These appear mostly new from the prior study. This is compatible with metastatic disease. The liver and spleen are unremarkable in appearance. The pancreas and adrenal glands are within normal limits.  Retroperitoneal lymphadenopathy is apparently mildly improved from the prior study. Matted lymphadenopathy still measures up to 4.0 cm in size.  The kidneys are unremarkable in appearance. There is no evidence of hydronephrosis. No renal or ureteral stones are seen. No perinephric stranding is appreciated.  There is diffuse wall thickening and soft tissue inflammation along the mid to distal ileum, to the level of  the patient's ileocolic anastomosis at the mid abdomen. This is compatible with acute infectious or inflammatory ileitis. Associated prominent mesenteric nodes may reflect the acute infectious or inflammatory process, or possibly metastatic disease.  The more proximal small bowel is unremarkable in appearance. The stomach is within normal limits. No acute vascular abnormalities are seen.  The remaining colon is partially filled with fluid and air, and is unremarkable in appearance. The sigmoid colon is mildly redundant.  The bladder is moderately distended grossly remarkable in appearance. The uterus is grossly unremarkable. The ovaries are relatively symmetric. No suspicious adnexal masses are seen. No inguinal lymphadenopathy is seen.  No acute osseous abnormalities are identified.  IMPRESSION: 1. Acute infectious or inflammatory ileitis involving the mid distal ileum, with diffuse wall thickening and soft tissue inflammation, to the level of the patient's ileocolic anastomosis. 2. Associated prominent mesenteric nodes may reflect the acute infectious or inflammatory process, or possibly metastatic disease. 3. Multiple hepatic masses, measuring up to 3.7 cm. These are mostly new from the prior study and compatible with metastatic disease. 4. Scattered pulmonary nodules at the lung bases are stable or mildly worsened from the prior study, compatible with metastatic disease. 5. Retroperitoneal lymphadenopathy is mildly improved from prior study. Matted lymphadenopathy still measures up to 4.0 cm in size.   Electronically Signed   By: Garald Balding M.D.   On: 11/14/2014 21:00    Assessment/Plan Present on Admission:  . Colitis . Dehydration . Hyponatremia . Hypokalemia   #1 ileocolitis #2 dehydration #3 hypernatremia #4 hypokalemia #5 colon cancer status post right colon resection  Admit to MedSurg. Given the patient's fever, I would favor infectious process. The patient had blood cultures obtained  in the emergency department ciprofloxacin and Flagyl. We'll continue these antibiotics. The patient did have a C. difficile sample obtained 3 days ago, which was negative. The patient has not received antibiotics since that time, therefore I do not believe that the patient likely has C. difficile. An enteric pathogen panel by PCR is pending.  patient will likely need to be hospitalized for a minimum of 48 hours to assure no bacteremia and negative blood cultures. Given that this is likely an infectious process, will hold the Imodium and Lomotil. We'll continue IV fluids with potassium supplementation for hypokalemia. We'll run IV fluids for hydration and to improve the patient's  hyponatremia.   DVT prophylaxis:  Lovenox   Consultants: none   Code Status: full code   Family Communication: Family in the room   Disposition Plan: home following improvement of colitis   Time spent: 70 minutes was spent with face-to-face time with patient with at least 50% with counseling and coordination of care  Truett Mainland, DO [facility] Pager [phone]

## 2014-11-14 NOTE — ED Notes (Signed)
MD at bedside. 

## 2014-11-15 ENCOUNTER — Other Ambulatory Visit: Payer: Self-pay | Admitting: Oncology

## 2014-11-15 DIAGNOSIS — K529 Noninfective gastroenteritis and colitis, unspecified: Principal | ICD-10-CM

## 2014-11-15 DIAGNOSIS — E871 Hypo-osmolality and hyponatremia: Secondary | ICD-10-CM

## 2014-11-15 DIAGNOSIS — E876 Hypokalemia: Secondary | ICD-10-CM

## 2014-11-15 LAB — BASIC METABOLIC PANEL
Anion gap: 6 (ref 5–15)
BUN: 5 mg/dL — ABNORMAL LOW (ref 6–23)
CHLORIDE: 101 mmol/L (ref 96–112)
CO2: 24 mmol/L (ref 19–32)
Calcium: 7.7 mg/dL — ABNORMAL LOW (ref 8.4–10.5)
Creatinine, Ser: 0.53 mg/dL (ref 0.50–1.10)
GFR calc Af Amer: >90 mL/min (ref >90)
GFR calc non Af Amer: >90 mL/min (ref >90)
Glucose, Bld: 89 mg/dL (ref 70–99)
POTASSIUM: 3.2 mmol/L — AB (ref 3.5–5.1)
SODIUM: 131 mmol/L — AB (ref 135–145)

## 2014-11-15 LAB — MAGNESIUM: Magnesium: 1.8 mg/dL (ref 1.5–2.5)

## 2014-11-15 MED ORDER — POTASSIUM CHLORIDE CRYS ER 20 MEQ PO TBCR
40.0000 meq | EXTENDED_RELEASE_TABLET | Freq: Once | ORAL | Status: AC
  Administered 2014-11-15: 40 meq via ORAL
  Filled 2014-11-15: qty 2

## 2014-11-15 MED ORDER — ONDANSETRON HCL 4 MG/2ML IJ SOLN
4.0000 mg | Freq: Four times a day (QID) | INTRAMUSCULAR | Status: DC | PRN
  Administered 2014-11-15 – 2014-11-16 (×3): 4 mg via INTRAVENOUS
  Filled 2014-11-15 (×4): qty 2

## 2014-11-15 MED ORDER — MAGNESIUM SULFATE 2 GM/50ML IV SOLN
2.0000 g | Freq: Once | INTRAVENOUS | Status: AC
Start: 2014-11-15 — End: 2014-11-15
  Administered 2014-11-15: 2 g via INTRAVENOUS
  Filled 2014-11-15: qty 50

## 2014-11-15 MED ORDER — LORAZEPAM 2 MG/ML IJ SOLN
1.0000 mg | Freq: Once | INTRAMUSCULAR | Status: AC
  Administered 2014-11-15: 1 mg via INTRAVENOUS
  Filled 2014-11-15: qty 1

## 2014-11-15 NOTE — Progress Notes (Signed)
[facility] PROGRESS NOTE  Martha Becker UMP:[id number] DOB: Dec 02, 1955 DOA: 11/14/2014 PCP: [name], MD  Assessment/Plan: Colitis -Likely infectious in origin. -Continue cipro/flagyl. -Still symptomatic with nausea. Will treat with PRN antiemetics. -C Diff 4 days ago negative. See no need to retest. -Only 1 BM so far today.  Hyponatremia -Improving with IVF.  Hypokalemia -Continue to replete PO.  Hypomagnesemia -Replete IV.  Colon Cancer -Follow with oncologist as an OP.   Code Status: Full Code Family Communication: Sister at bedside  Disposition Plan: Home when ready   Consultants:  None   Antibiotics:  Cipro  Flagyl   Subjective: C/o n/v today. Only 1 liquid BM today.  Objective: Filed Vitals:   11/14/14 2319 11/14/14 2327 11/15/14 0615 11/15/14 1554  BP:  102/58 110/71 106/54  Pulse:  67 76 66  Temp:  99.8 F (37.7 C) 98.9 F (37.2 C) 97.5 F (36.4 C)  TempSrc:  Oral Oral Oral  Resp:  16 16 16   Height: 5\' 6"  (1.676 m)     Weight: 68.8 kg (151 lb 10.8 oz)     SpO2:  96% 95% 95%    Intake/Output Summary (Last 24 hours) at 11/15/14 1559 Last data filed at 11/15/14 1500  Gross per 24 hour  Intake  967.5 ml  Output    800 ml  Net  167.5 ml   Filed Weights   11/14/14 1650 11/14/14 2319  Weight: 63.504 kg (140 lb) 68.8 kg (151 lb 10.8 oz)    Exam:   General:  AA Ox3  Cardiovascular: RRR  Respiratory: CTA B  Abdomen: S/NT/ND/+BS  Extremities: no C/C/E   Neurologic:  Intact/non-focal  Data Reviewed: Basic Metabolic Panel:  Recent Labs Lab 11/10/14 1428 11/14/14 1709 11/14/14 1831 11/15/14 0637  NA 133* 128*  --  131*  K 3.8 2.4*  --  3.2*  CL  --  91*  --  101  CO2 23 28  --  24  GLUCOSE 101 108*  --  89  BUN 10.0 7  --  5*  CREATININE 0.8 0.74  --  0.53  CALCIUM 9.0 8.0*  --  7.7*  MG  --   --  1.4* 1.8   Liver Function Tests:  Recent Labs Lab 11/10/14 1428 11/14/14 1709  AST 13 15  ALT <6 12   ALKPHOS 75 56  BILITOT 0.36 0.5  PROT 5.6* 5.4*  ALBUMIN 2.9* 2.6*    Recent Labs Lab 11/14/14 1709  LIPASE 43   No results for input(s): AMMONIA in the last 168 hours. CBC:  Recent Labs Lab 11/10/14 1428 11/14/14 1709  WBC 11.1* 8.2  NEUTROABS 8.5* 4.8  HGB 11.7 11.4*  HCT 36.5 33.2*  MCV 84.6 81.2  PLT 323 277   Cardiac Enzymes: No results for input(s): CKTOTAL, CKMB, CKMBINDEX, TROPONINI in the last 168 hours. BNP (last 3 results) No results for input(s): BNP in the last 8760 hours.  ProBNP (last 3 results) No results for input(s): PROBNP in the last 8760 hours.  CBG: No results for input(s): GLUCAP in the last 168 hours.  Recent Results (from the past 240 hour(s))  Clostridium Difficile by PCR     Status: None   Collection Time: 11/11/14  3:31 PM  Result Value Ref Range Status   C difficile by pcr NEGATIVE NEGATIVE Final  Blood culture (routine x 2)     Status: None (Preliminary result)   Collection Time: 11/14/14  5:30 PM  Result Value Ref  Range Status   Specimen Description BLOOD RIGHT ARM  Final   Special Requests BOTTLES DRAWN AEROBIC AND ANAEROBIC 6CC  Final   Culture NO GROWTH 1 DAY  Final   Report Status PENDING  Incomplete  Blood culture (routine x 2)     Status: None (Preliminary result)   Collection Time: 11/14/14  5:30 PM  Result Value Ref Range Status   Specimen Description BLOOD RIGHT HAND  Final   Special Requests BOTTLES DRAWN AEROBIC AND ANAEROBIC 6CC  Final   Culture NO GROWTH 1 DAY  Final   Report Status PENDING  Incomplete     Studies: Dg Chest 2 View  11/14/2014   CLINICAL DATA:  Initial evaluation for weakness, shortness of breath. History of colon cancer.  Lungs are normally inflated. No focal infiltrate, pulmonary edema, or pleural effusion.  EXAM: CHEST  2 VIEW  COMPARISON:  Prior study from 08/28/2014.  FINDINGS: Cardiac and mediastinal silhouettes are stable in size and contour, and remain within normal limits.  Lungs are  normally inflated. No focal infiltrate, pulmonary edema, or pleural effusion. No pneumothorax.  Multiple bilateral pulmonary nodules again seen, which appear overall increased in size and number as compared to prior radiograph from 08/28/2014. The largest nodule in the right lung located in the right lower lobe and measures 18 mm, previously 8 mm. Largest in the left lung located in the left lower lobe measuring 11 mm, previously approximately 8 mm. New nodule evident within the left upper lobe measures 11 mm.  No acute osseus abnormality. No worrisome lytic or blastic osseous lesions. Prominent gaseous distention noted within several loops of bowel in the left upper quadrant.  IMPRESSION: 1. No active cardiopulmonary disease. 2. Increased size and number of multiple bilateral pulmonary nodules, concerning for progressive metastatic disease.   Electronically Signed   By: Jeannine Boga M.D.   On: 11/14/2014 22:10   Ct Abdomen Pelvis W Contrast  11/14/2014   CLINICAL DATA:  Worsening weakness, with nausea, vomiting and diarrhea. Current history of colon cancer with metastatic disease, on chemotherapy. Initial encounter.  EXAM: CT ABDOMEN AND PELVIS WITH CONTRAST  TECHNIQUE: Multidetector CT imaging of the abdomen and pelvis was performed using the standard protocol following bolus administration of intravenous contrast.  CONTRAST:  42mL OMNIPAQUE IOHEXOL 300 MG/ML SOLN, 146mL OMNIPAQUE IOHEXOL 300 MG/ML SOLN  COMPARISON:  PET/CT performed 09/22/2014  FINDINGS: Scattered pulmonary nodules at the lung bases are stable or mildly worsened from the prior study, with a few new pulmonary nodules seen. This is compatible with metastatic disease.  Multiple hepatic masses are seen, measuring up to 3.7 cm. These appear mostly new from the prior study. This is compatible with metastatic disease. The liver and spleen are unremarkable in appearance. The pancreas and adrenal glands are within normal limits.  Retroperitoneal  lymphadenopathy is apparently mildly improved from the prior study. Matted lymphadenopathy still measures up to 4.0 cm in size.  The kidneys are unremarkable in appearance. There is no evidence of hydronephrosis. No renal or ureteral stones are seen. No perinephric stranding is appreciated.  There is diffuse wall thickening and soft tissue inflammation along the mid to distal ileum, to the level of the patient's ileocolic anastomosis at the mid abdomen. This is compatible with acute infectious or inflammatory ileitis. Associated prominent mesenteric nodes may reflect the acute infectious or inflammatory process, or possibly metastatic disease.  The more proximal small bowel is unremarkable in appearance. The stomach is within normal limits. No acute  vascular abnormalities are seen.  The remaining colon is partially filled with fluid and air, and is unremarkable in appearance. The sigmoid colon is mildly redundant.  The bladder is moderately distended grossly remarkable in appearance. The uterus is grossly unremarkable. The ovaries are relatively symmetric. No suspicious adnexal masses are seen. No inguinal lymphadenopathy is seen.  No acute osseous abnormalities are identified.  IMPRESSION: 1. Acute infectious or inflammatory ileitis involving the mid distal ileum, with diffuse wall thickening and soft tissue inflammation, to the level of the patient's ileocolic anastomosis. 2. Associated prominent mesenteric nodes may reflect the acute infectious or inflammatory process, or possibly metastatic disease. 3. Multiple hepatic masses, measuring up to 3.7 cm. These are mostly new from the prior study and compatible with metastatic disease. 4. Scattered pulmonary nodules at the lung bases are stable or mildly worsened from the prior study, compatible with metastatic disease. 5. Retroperitoneal lymphadenopathy is mildly improved from prior study. Matted lymphadenopathy still measures up to 4.0 cm in size.   Electronically  Signed   By: Garald Balding M.D.   On: 11/14/2014 21:00    Scheduled Meds: . acidophilus  1 capsule Oral BID  . ciprofloxacin  400 mg Intravenous Q12H  . enoxaparin (LOVENOX) injection  40 mg Subcutaneous Q24H  . metronidazole  500 mg Intravenous Q8H  . potassium chloride  40 mEq Oral BID   Continuous Infusions: . 0.9 % NaCl with KCl 40 mEq / L 125 mL/hr (11/15/14 0911)    Active Problems:   Colitis   Dehydration   Hyponatremia   Hypokalemia    Time spent: 35 minutes. Greater than 50% of this time was spent in direct contact with the patient coordinating care.    Lelon Frohlich  [facility] Pager [phone]  If 7PM-7AM, please contact night-coverage at [email or web address], password [facility] 11/15/2014, 3:59 PM  LOS: 1 day

## 2014-11-16 LAB — CBC
HEMATOCRIT: 31 % — AB (ref 36.0–46.0)
Hemoglobin: 10.3 g/dL — ABNORMAL LOW (ref 12.0–15.0)
MCH: 27.2 pg (ref 26.0–34.0)
MCHC: 33.2 g/dL (ref 30.0–36.0)
MCV: 81.8 fL (ref 78.0–100.0)
Platelets: 324 10*3/uL (ref 150–400)
RBC: 3.79 MIL/uL — AB (ref 3.87–5.11)
RDW: 14.6 % (ref 11.5–15.5)
WBC: 10.9 10*3/uL — ABNORMAL HIGH (ref 4.0–10.5)

## 2014-11-16 LAB — BASIC METABOLIC PANEL
Anion gap: 8 (ref 5–15)
BUN: 6 mg/dL (ref 6–23)
CALCIUM: 8.1 mg/dL — AB (ref 8.4–10.5)
CO2: 23 mmol/L (ref 19–32)
Chloride: 102 mmol/L (ref 96–112)
Creatinine, Ser: 0.56 mg/dL (ref 0.50–1.10)
GFR calc Af Amer: >90 mL/min (ref >90)
GLUCOSE: 83 mg/dL (ref 70–99)
Potassium: 4.8 mmol/L (ref 3.5–5.1)
Sodium: 133 mmol/L — ABNORMAL LOW (ref 135–145)

## 2014-11-16 LAB — URINE CULTURE
COLONY COUNT: NO GROWTH
CULTURE: NO GROWTH

## 2014-11-16 LAB — OCCULT BLOOD, POC DEVICE: FECAL OCCULT BLD: NEGATIVE

## 2014-11-16 MED ORDER — METOCLOPRAMIDE HCL 5 MG/ML IJ SOLN
10.0000 mg | Freq: Four times a day (QID) | INTRAMUSCULAR | Status: DC
  Administered 2014-11-16 – 2014-11-18 (×7): 10 mg via INTRAVENOUS
  Filled 2014-11-16 (×7): qty 2

## 2014-11-16 MED ORDER — SODIUM CHLORIDE 0.9 % IV SOLN
INTRAVENOUS | Status: DC
  Administered 2014-11-16 – 2014-11-17 (×3): via INTRAVENOUS

## 2014-11-16 MED ORDER — OXYCODONE-ACETAMINOPHEN 5-325 MG PO TABS
1.0000 | ORAL_TABLET | Freq: Three times a day (TID) | ORAL | Status: DC | PRN
Start: 2014-11-16 — End: 2014-11-18
  Administered 2014-11-16 – 2014-11-17 (×2): 1 via ORAL
  Filled 2014-11-16 (×3): qty 1

## 2014-11-16 MED ORDER — GI COCKTAIL ~~LOC~~: 30.0000 mL | Freq: Three times a day (TID) | ORAL | Status: DC | PRN

## 2014-11-16 MED ORDER — ONDANSETRON HCL 4 MG/2ML IJ SOLN
4.0000 mg | Freq: Four times a day (QID) | INTRAMUSCULAR | Status: DC
  Administered 2014-11-16 – 2014-11-18 (×7): 4 mg via INTRAVENOUS
  Filled 2014-11-16 (×7): qty 2

## 2014-11-16 MED ORDER — PANTOPRAZOLE SODIUM 40 MG IV SOLR
40.0000 mg | Freq: Two times a day (BID) | INTRAVENOUS | Status: DC
  Administered 2014-11-16 – 2014-11-17 (×3): 40 mg via INTRAVENOUS
  Filled 2014-11-16 (×3): qty 40

## 2014-11-16 NOTE — Progress Notes (Signed)
INITIAL NUTRITION ASSESSMENT   INTERVENTION: Magic cup BID with meals, each supplement provides 290 kcal and 9 grams of protein   Recommend re-weight to determine current weight status and evaluate for changes  NUTRITION DIAGNOSIS: Inadequate oral intake related to colitis as evidenced by c.diff and meal intake records and diet hx PTA  Goal: Pt to meet >/= 90% of their estimated nutrition needs; not met   Monitor:  Weight status, labs and meal /supplement tolerance  Reason for Assessment: Malnutrition Screen   59 y.o. female   ASSESSMENT: Pt 59 yo admitted on 3/5 with ileocolitis, dehydration,hyponatremia, hypokalemia. Pt says she's been experiencing diarrhea for past 2 weeks.  Diarrhea has improved per pt but she has been nauseated with and episode of vomiting yesterday. Pt has been drinking fluids mainly water, ginger ale and juices (grape/cranberry).  Her hx also includes colon cancer (stage IV) and s/p right heicolectomy. First tx chemotherapy (oxaliplatin) on 2/18 at [facility]. She's followed by the RD there and provided education (Eating Hints book) to help her maintain/optimize her nutritional status.  She has follow up visit with RD on 3/11 during her chemotherapy. Weight gain of 7% noted in < 1 week? Request re-weight be obtained.  Nutrition focused exam: deferred at this time  Abnormal Labs: sodium-133   Height: Ht Readings from Last 1 Encounters:  11/14/14 $RemoveB'5\' 6"'jWqdPdca$  (1.676 m)    Weight: Wt Readings from Last 1 Encounters:  11/14/14 151 lb 10.8 oz (68.8 kg)    Ideal Body Weight: 135#  % Ideal Body Weight: 113%  Wt Readings from Last 10 Encounters:  11/14/14 151 lb 10.8 oz (68.8 kg)  11/10/14 141 lb 9.6 oz (64.229 kg)  10/21/14 144 lb (65.318 kg)  10/20/14 143 lb 1.6 oz (64.91 kg)  09/15/14 142 lb (64.411 kg)  08/28/14 144 lb 8 oz (65.545 kg)  08/21/14 148 lb (67.132 kg)  06/03/14 143 lb (64.864 kg)    Usual Body Weight: 142-148#  % Usual  Body Weight: 107%  BMI:  Body mass index is 24.49 kg/(m^2). normal range  Estimated Nutritional Needs: Kcal: 1900-2100 Protein: 80-90 gr Fluid: 2000 ml daily (normal needs)  Skin: intact  Diet Order: Diet regular  EDUCATION NEEDS: -Education needs addressed   Intake/Output Summary (Last 24 hours) at 11/16/14 1051 Last data filed at 11/16/14 0645  Gross per 24 hour  Intake 4609.17 ml  Output   1300 ml  Net 3309.17 ml    Last BM: 11/15/14   Labs:   Recent Labs Lab 11/14/14 1709 11/14/14 1831 11/15/14 0637 11/16/14 0558  NA 128*  --  131* 133*  K 2.4*  --  3.2* 4.8  CL 91*  --  101 102  CO2 28  --  24 23  BUN 7  --  5* 6  CREATININE 0.74  --  0.53 0.56  CALCIUM 8.0*  --  7.7* 8.1*  MG  --  1.4* 1.8  --   GLUCOSE 108*  --  89 83    CBG (last 3)  No results for input(s): GLUCAP in the last 72 hours.  Scheduled Meds: . acidophilus  1 capsule Oral BID  . ciprofloxacin  400 mg Intravenous Q12H  . enoxaparin (LOVENOX) injection  40 mg Subcutaneous Q24H  . metronidazole  500 mg Intravenous Q8H    Continuous Infusions:   Past Medical History  Diagnosis Date  . Colon cancer     Past Surgical History  Procedure Laterality Date  .  Cesarean section    . Partial colectomy N/A 08/31/2014    Procedure: Right Colon Resection ;  Surgeon: Jamesetta So, MD;  Location: [facility];  Service: General;  Laterality: N/A;  . Liver biopsy N/A 08/31/2014    Procedure: LIVER BIOPSY;  Surgeon: Jamesetta So, MD;  Location: [facility];  Service: General;  Laterality: N/A;    Colman Cater MS,RD,CSG,LDN Office: [phone] Pager: [phone]

## 2014-11-16 NOTE — Progress Notes (Signed)
[facility] PROGRESS NOTE  Martha Becker ZOX:[id number] DOB: 1956-05-01 DOA: 11/14/2014 PCP: Selinda Flavin, MD  Assessment/Plan: Colitis -Likely infectious in origin, although her recent chemotherapy may also be playing a role -Continue cipro/flagyl. -continue supportive treatment -Still symptomatic with nausea. Schedule Zofran q6h and start reglan -C Diff 4 days ago negative and diarrhea appears to have resolved  GERD -start on protonix and GI cocktail  Hyponatremia -Improving with IVF.  Hypokalemia -Improved.  Hypomagnesemia -Replete IV.  Colon Cancer -Follow with oncologist as an OP.   Code Status: Full Code Family Communication: discussed with patient  Disposition Plan: Home when ready   Consultants:  None   Antibiotics:  Cipro  Flagyl   Subjective: Diarrhea has resolved, having nausea and vomiting as well as periumbilical abdominal pain  Objective: Filed Vitals:   11/15/14 0615 11/15/14 1554 11/15/14 2240 11/16/14 0620  BP: 110/71 106/54 106/70 107/63  Pulse: 76 66 64 68  Temp: 98.9 F (37.2 C) 97.5 F (36.4 C) 98.2 F (36.8 C) 98.5 F (36.9 C)  TempSrc: Oral Oral Oral Oral  Resp: 16 16 16 16   Height:      Weight:      SpO2: 95% 95% 95% 98%    Intake/Output Summary (Last 24 hours) at 11/16/14 1815 Last data filed at 11/16/14 1519  Gross per 24 hour  Intake 4669.17 ml  Output   1300 ml  Net 3369.17 ml   Filed Weights   11/14/14 1650 11/14/14 2319  Weight: 63.504 kg (140 lb) 68.8 kg (151 lb 10.8 oz)    Exam:   General:  AA Ox3  Cardiovascular: RRR  Respiratory: CTA B  Abdomen: S/diffusely tender/ND/+BS  Extremities: no C/C/E   Neurologic:  Intact/non-focal  Data Reviewed: Basic Metabolic Panel:  Recent Labs Lab 11/10/14 1428 11/14/14 1709 11/14/14 1831 11/15/14 0637 11/16/14 0558  NA 133* 128*  --  131* 133*  K 3.8 2.4*  --  3.2* 4.8  CL  --  91*  --  101 102  CO2 23 28  --  24 23  GLUCOSE 101 108*   --  89 83  BUN 10.0 7  --  5* 6  CREATININE 0.8 0.74  --  0.53 0.56  CALCIUM 9.0 8.0*  --  7.7* 8.1*  MG  --   --  1.4* 1.8  --    Liver Function Tests:  Recent Labs Lab 11/10/14 1428 11/14/14 1709  AST 13 15  ALT <6 12  ALKPHOS 75 56  BILITOT 0.36 0.5  PROT 5.6* 5.4*  ALBUMIN 2.9* 2.6*    Recent Labs Lab 11/14/14 1709  LIPASE 43   No results for input(s): AMMONIA in the last 168 hours. CBC:  Recent Labs Lab 11/10/14 1428 11/14/14 1709 11/16/14 0558  WBC 11.1* 8.2 10.9*  NEUTROABS 8.5* 4.8  --   HGB 11.7 11.4* 10.3*  HCT 36.5 33.2* 31.0*  MCV 84.6 81.2 81.8  PLT 323 277 324   Cardiac Enzymes: No results for input(s): CKTOTAL, CKMB, CKMBINDEX, TROPONINI in the last 168 hours. BNP (last 3 results) No results for input(s): BNP in the last 8760 hours.  ProBNP (last 3 results) No results for input(s): PROBNP in the last 8760 hours.  CBG: No results for input(s): GLUCAP in the last 168 hours.  Recent Results (from the past 240 hour(s))  Clostridium Difficile by PCR     Status: None   Collection Time: 11/11/14  3:31 PM  Result Value Ref Range Status  C difficile by pcr NEGATIVE NEGATIVE Final  Blood culture (routine x 2)     Status: None (Preliminary result)   Collection Time: 11/14/14  5:30 PM  Result Value Ref Range Status   Specimen Description BLOOD RIGHT ARM  Final   Special Requests BOTTLES DRAWN AEROBIC AND ANAEROBIC 6CC  Final   Culture NO GROWTH 2 DAYS  Final   Report Status PENDING  Incomplete  Blood culture (routine x 2)     Status: None (Preliminary result)   Collection Time: 11/14/14  5:30 PM  Result Value Ref Range Status   Specimen Description BLOOD RIGHT HAND  Final   Special Requests BOTTLES DRAWN AEROBIC AND ANAEROBIC 6CC  Final   Culture NO GROWTH 2 DAYS  Final   Report Status PENDING  Incomplete  Urine culture     Status: None   Collection Time: 11/14/14  8:10 PM  Result Value Ref Range Status   Specimen Description URINE, CLEAN  CATCH  Final   Special Requests NONE  Final   Colony Count NO GROWTH Performed at [facility]   Final   Culture NO GROWTH Performed at [facility]   Final   Report Status 11/16/2014 FINAL  Final     Studies: Dg Chest 2 View  11/14/2014   CLINICAL DATA:  Initial evaluation for weakness, shortness of breath. History of colon cancer.  Lungs are normally inflated. No focal infiltrate, pulmonary edema, or pleural effusion.  EXAM: CHEST  2 VIEW  COMPARISON:  Prior study from 08/28/2014.  FINDINGS: Cardiac and mediastinal silhouettes are stable in size and contour, and remain within normal limits.  Lungs are normally inflated. No focal infiltrate, pulmonary edema, or pleural effusion. No pneumothorax.  Multiple bilateral pulmonary nodules again seen, which appear overall increased in size and number as compared to prior radiograph from 08/28/2014. The largest nodule in the right lung located in the right lower lobe and measures 18 mm, previously 8 mm. Largest in the left lung located in the left lower lobe measuring 11 mm, previously approximately 8 mm. New nodule evident within the left upper lobe measures 11 mm.  No acute osseus abnormality. No worrisome lytic or blastic osseous lesions. Prominent gaseous distention noted within several loops of bowel in the left upper quadrant.  IMPRESSION: 1. No active cardiopulmonary disease. 2. Increased size and number of multiple bilateral pulmonary nodules, concerning for progressive metastatic disease.   Electronically Signed   By: Rise Mu M.D.   On: 11/14/2014 22:10   Ct Abdomen Pelvis W Contrast  11/14/2014   CLINICAL DATA:  Worsening weakness, with nausea, vomiting and diarrhea. Current history of colon cancer with metastatic disease, on chemotherapy. Initial encounter.  EXAM: CT ABDOMEN AND PELVIS WITH CONTRAST  TECHNIQUE: Multidetector CT imaging of the abdomen and pelvis was performed using the standard protocol following bolus  administration of intravenous contrast.  CONTRAST:  25mL OMNIPAQUE IOHEXOL 300 MG/ML SOLN, OMNIPAQUE IOHEXOL 300 MG/ML SOLN  COMPARISON:  PET/CT performed 09/22/2014  FINDINGS: Scattered pulmonary nodules at the lung bases are stable or mildly worsened from the prior study, with a few new pulmonary nodules seen. This is compatible with metastatic disease.  Multiple hepatic masses are seen, measuring up to 3.7 cm. These appear mostly new from the prior study. This is compatible with metastatic disease. The liver and spleen are unremarkable in appearance. The pancreas and adrenal glands are within normal limits.  Retroperitoneal lymphadenopathy is apparently mildly improved from the prior  study. Matted lymphadenopathy still measures up to 4.0 cm in size.  The kidneys are unremarkable in appearance. There is no evidence of hydronephrosis. No renal or ureteral stones are seen. No perinephric stranding is appreciated.  There is diffuse wall thickening and soft tissue inflammation along the mid to distal ileum, to the level of the patient's ileocolic anastomosis at the mid abdomen. This is compatible with acute infectious or inflammatory ileitis. Associated prominent mesenteric nodes may reflect the acute infectious or inflammatory process, or possibly metastatic disease.  The more proximal small bowel is unremarkable in appearance. The stomach is within normal limits. No acute vascular abnormalities are seen.  The remaining colon is partially filled with fluid and air, and is unremarkable in appearance. The sigmoid colon is mildly redundant.  The bladder is moderately distended grossly remarkable in appearance. The uterus is grossly unremarkable. The ovaries are relatively symmetric. No suspicious adnexal masses are seen. No inguinal lymphadenopathy is seen.  No acute osseous abnormalities are identified.  IMPRESSION: 1. Acute infectious or inflammatory ileitis involving the mid distal ileum, with diffuse wall  thickening and soft tissue inflammation, to the level of the patient's ileocolic anastomosis. 2. Associated prominent mesenteric nodes may reflect the acute infectious or inflammatory process, or possibly metastatic disease. 3. Multiple hepatic masses, measuring up to 3.7 cm. These are mostly new from the prior study and compatible with metastatic disease. 4. Scattered pulmonary nodules at the lung bases are stable or mildly worsened from the prior study, compatible with metastatic disease. 5. Retroperitoneal lymphadenopathy is mildly improved from prior study. Matted lymphadenopathy still measures up to 4.0 cm in size.   Electronically Signed   By: Roanna Raider M.D.   On: 11/14/2014 21:00    Scheduled Meds: . acidophilus  1 capsule Oral BID  . ciprofloxacin  400 mg Intravenous Q12H  . enoxaparin (LOVENOX) injection  40 mg Subcutaneous Q24H  . metoCLOPramide (REGLAN) injection  10 mg Intravenous 4 times per day  . metronidazole  500 mg Intravenous Q8H  . ondansetron (ZOFRAN) IV  4 mg Intravenous 4 times per day  . pantoprazole (PROTONIX) IV  40 mg Intravenous Q12H   Continuous Infusions: . sodium chloride 100 mL/hr at 11/16/14 1729    Active Problems:   Colitis   Dehydration   Hyponatremia   Hypokalemia   Hypomagnesemia    Time spent: 35 minutes. Greater than 50% of this time was spent in direct contact with the patient coordinating care.    Britain Saber  [facility] Pager [phone]  If 7PM-7AM, please contact night-coverage at [email or web address], password [facility] 11/16/2014, 6:15 PM  LOS: 2 days

## 2014-11-16 NOTE — Progress Notes (Signed)
UR chart review completed.  

## 2014-11-17 ENCOUNTER — Telehealth: Payer: Self-pay | Admitting: *Deleted

## 2014-11-17 LAB — BASIC METABOLIC PANEL
Anion gap: 7 (ref 5–15)
CALCIUM: 8.2 mg/dL — AB (ref 8.4–10.5)
CHLORIDE: 101 mmol/L (ref 96–112)
CO2: 24 mmol/L (ref 19–32)
CREATININE: 0.54 mg/dL (ref 0.50–1.10)
GFR calc Af Amer: >90 mL/min (ref >90)
GFR calc non Af Amer: >90 mL/min (ref >90)
GLUCOSE: 98 mg/dL (ref 70–99)
Potassium: 4 mmol/L (ref 3.5–5.1)
Sodium: 132 mmol/L — ABNORMAL LOW (ref 135–145)

## 2014-11-17 LAB — CBC
HCT: 32.2 % — ABNORMAL LOW (ref 36.0–46.0)
Hemoglobin: 10.9 g/dL — ABNORMAL LOW (ref 12.0–15.0)
MCH: 27.7 pg (ref 26.0–34.0)
MCHC: 33.9 g/dL (ref 30.0–36.0)
MCV: 81.7 fL (ref 78.0–100.0)
PLATELETS: 347 10*3/uL (ref 150–400)
RBC: 3.94 MIL/uL (ref 3.87–5.11)
RDW: 14.6 % (ref 11.5–15.5)
WBC: 7 10*3/uL (ref 4.0–10.5)

## 2014-11-17 NOTE — Progress Notes (Signed)
[facility] PROGRESS NOTE  Martha Becker WGN:[id number] DOB: December 13, 1955 DOA: 11/14/2014 PCP: Selinda Flavin, MD  Assessment/Plan: Colitis -Likely infectious in origin, although her recent chemotherapy may also be playing a role -Continue cipro/flagyl. -continue supportive treatment -Still symptomatic with nausea. Schedule Zofran q6h and start reglan -C Diff 4 days ago negative and diarrhea appears to have resolved  GERD -started on protonix and GI cocktail, with some improvement  Hyponatremia -Improving with IVF.  Hypokalemia -Improved.  Hypomagnesemia -Replete IV.  Colon Cancer -Follow with oncologist as an OP.   Code Status: Full Code Family Communication: discussed with patient  Disposition Plan: Home when ready   Consultants:  None   Antibiotics:  Cipro  Flagyl   Subjective: Diarrhea resolved, nausea vomiting improving. Feels a little better today  Objective: Filed Vitals:   11/16/14 0620 11/16/14 2149 11/17/14 0634 11/17/14 1551  BP: 107/63 111/78 116/64 113/71  Pulse: 68 69 64 66  Temp: 98.5 F (36.9 C) 98.4 F (36.9 C) 97.8 F (36.6 C)   TempSrc: Oral Oral Oral   Resp: 16 16 16 16   Height:      Weight:   68.947 kg (152 lb)   SpO2: 98% 98% 97% 98%    Intake/Output Summary (Last 24 hours) at 11/17/14 1820 Last data filed at 11/17/14 1741  Gross per 24 hour  Intake 1368.33 ml  Output      0 ml  Net 1368.33 ml   Filed Weights   11/14/14 1650 11/14/14 2319 11/17/14 0634  Weight: 63.504 kg (140 lb) 68.8 kg (151 lb 10.8 oz) 68.947 kg (152 lb)    Exam:   General:  AA Ox3  Cardiovascular: RRR  Respiratory: CTA B  Abdomen: S/diffusely tender/ND/+BS  Extremities: no C/C/E   Neurologic:  Intact/non-focal  Data Reviewed: Basic Metabolic Panel:  Recent Labs Lab 11/14/14 1709 11/14/14 1831 11/15/14 0637 11/16/14 0558 11/17/14 0658  NA 128*  --  131* 133* 132*  K 2.4*  --  3.2* 4.8 4.0  CL 91*  --  101 102 101  CO2  28  --  24 23 24   GLUCOSE 108*  --  89 83 98  BUN 7  --  5* 6 <5*  CREATININE 0.74  --  0.53 0.56 0.54  CALCIUM 8.0*  --  7.7* 8.1* 8.2*  MG  --  1.4* 1.8  --   --    Liver Function Tests:  Recent Labs Lab 11/14/14 1709  AST 15  ALT 12  ALKPHOS 56  BILITOT 0.5  PROT 5.4*  ALBUMIN 2.6*    Recent Labs Lab 11/14/14 1709  LIPASE 43   No results for input(s): AMMONIA in the last 168 hours. CBC:  Recent Labs Lab 11/14/14 1709 11/16/14 0558 11/17/14 0658  WBC 8.2 10.9* 7.0  NEUTROABS 4.8  --   --   HGB 11.4* 10.3* 10.9*  HCT 33.2* 31.0* 32.2*  MCV 81.2 81.8 81.7  PLT 277 324 347   Cardiac Enzymes: No results for input(s): CKTOTAL, CKMB, CKMBINDEX, TROPONINI in the last 168 hours. BNP (last 3 results) No results for input(s): BNP in the last 8760 hours.  ProBNP (last 3 results) No results for input(s): PROBNP in the last 8760 hours.  CBG: No results for input(s): GLUCAP in the last 168 hours.  Recent Results (from the past 240 hour(s))  Clostridium Difficile by PCR     Status: None   Collection Time: 11/11/14  3:31 PM  Result Value Ref Range  Status   C difficile by pcr NEGATIVE NEGATIVE Final  Blood culture (routine x 2)     Status: None (Preliminary result)   Collection Time: 11/14/14  5:30 PM  Result Value Ref Range Status   Specimen Description BLOOD RIGHT ARM  Final   Special Requests BOTTLES DRAWN AEROBIC AND ANAEROBIC 6CC  Final   Culture NO GROWTH 3 DAYS  Final   Report Status PENDING  Incomplete  Blood culture (routine x 2)     Status: None (Preliminary result)   Collection Time: 11/14/14  5:30 PM  Result Value Ref Range Status   Specimen Description BLOOD RIGHT HAND  Final   Special Requests BOTTLES DRAWN AEROBIC AND ANAEROBIC 6CC  Final   Culture NO GROWTH 3 DAYS  Final   Report Status PENDING  Incomplete  Urine culture     Status: None   Collection Time: 11/14/14  8:10 PM  Result Value Ref Range Status   Specimen Description URINE, CLEAN  CATCH  Final   Special Requests NONE  Final   Colony Count NO GROWTH Performed at [facility]   Final   Culture NO GROWTH Performed at [facility]   Final   Report Status 11/16/2014 FINAL  Final     Studies: No results found.  Scheduled Meds: . acidophilus  1 capsule Oral BID  . ciprofloxacin  400 mg Intravenous Q12H  . enoxaparin (LOVENOX) injection  40 mg Subcutaneous Q24H  . metoCLOPramide (REGLAN) injection  10 mg Intravenous 4 times per day  . metronidazole  500 mg Intravenous Q8H  . ondansetron (ZOFRAN) IV  4 mg Intravenous 4 times per day  . pantoprazole (PROTONIX) IV  40 mg Intravenous Q12H   Continuous Infusions: . sodium chloride 100 mL/hr at 11/17/14 1027    Active Problems:   Colitis   Dehydration   Hyponatremia   Hypokalemia   Hypomagnesemia    Time spent: 35 minutes. Greater than 50% of this time was spent in direct contact with the patient coordinating care.    Kymere Fullington  [facility] Pager [phone]  If 7PM-7AM, please contact night-coverage at [email or web address], password [facility] 11/17/2014, 6:20 PM  LOS: 3 days

## 2014-11-17 NOTE — Progress Notes (Signed)
ANTIBIOTIC CONSULT NOTE  Pharmacy Consult for Cipro Indication: Colitis  Allergies  Allergen Reactions  . Codeine Nausea And Vomiting    Patient Measurements: Height: 5\' 6"  (167.6 cm) Weight: 152 lb (68.947 kg) IBW/kg (Calculated) : 59.3  Vital Signs: Temp: 97.8 F (36.6 C) (03/08 0634) Temp Source: Oral (03/08 0634) BP: 116/64 mmHg (03/08 0634) Pulse Rate: 64 (03/08 0634) Intake/Output from previous day: 03/07 0701 - 03/08 0700 In: 300 [IV Piggyback:300] Out: -  Intake/Output from this shift:    Labs:  Recent Labs  11/14/14 1709 11/15/14 0637 11/16/14 0558 11/17/14 0658  WBC 8.2  --  10.9* 7.0  HGB 11.4*  --  10.3* 10.9*  PLT 277  --  324 347  CREATININE 0.74 0.53 0.56 0.54   Estimated Creatinine Clearance: 73.5 mL/min (by C-G formula based on Cr of 0.54). No results for input(s): VANCOTROUGH, VANCOPEAK, VANCORANDOM, GENTTROUGH, GENTPEAK, GENTRANDOM, TOBRATROUGH, TOBRAPEAK, TOBRARND, AMIKACINPEAK, AMIKACINTROU, AMIKACIN in the last 72 hours.   Microbiology: Recent Results (from the past 720 hour(s))  Clostridium Difficile by PCR     Status: None   Collection Time: 11/11/14  3:31 PM  Result Value Ref Range Status   C difficile by pcr NEGATIVE NEGATIVE Final  Blood culture (routine x 2)     Status: None (Preliminary result)   Collection Time: 11/14/14  5:30 PM  Result Value Ref Range Status   Specimen Description BLOOD RIGHT ARM  Final   Special Requests BOTTLES DRAWN AEROBIC AND ANAEROBIC 6CC  Final   Culture NO GROWTH 2 DAYS  Final   Report Status PENDING  Incomplete  Blood culture (routine x 2)     Status: None (Preliminary result)   Collection Time: 11/14/14  5:30 PM  Result Value Ref Range Status   Specimen Description BLOOD RIGHT HAND  Final   Special Requests BOTTLES DRAWN AEROBIC AND ANAEROBIC 6CC  Final   Culture NO GROWTH 2 DAYS  Final   Report Status PENDING  Incomplete  Urine culture     Status: None   Collection Time: 11/14/14  8:10 PM   Result Value Ref Range Status   Specimen Description URINE, CLEAN CATCH  Final   Special Requests NONE  Final   Colony Count NO GROWTH Performed at [facility]   Final   Culture NO GROWTH Performed at [facility]   Final   Report Status 11/16/2014 FINAL  Final    Anti-infectives    Start     Dose/Rate Route Frequency Ordered Stop   11/15/14 0600  metroNIDAZOLE (FLAGYL) IVPB 500 mg     500 mg 100 mL/hr over 60 Minutes Intravenous Every 8 hours 11/14/14 2306     11/14/14 2100  ciprofloxacin (CIPRO) IVPB 400 mg     400 mg 200 mL/hr over 60 Minutes Intravenous Every 12 hours 11/14/14 2047     11/14/14 2045  metroNIDAZOLE (FLAGYL) IVPB 500 mg     500 mg 100 mL/hr over 60 Minutes Intravenous  Once 11/14/14 2036 11/14/14 2223      Assessment: 59 yo F who has had persistant N/V/D since chemo treatment earlier this week.   She was febrile on admission.  Renal function is at patient's baseline.  Cipro 3/5>> Flagyl 3/5>>  Goal of Therapy:  Eradicate infection.  Plan:  Continue Cipro 400mg  IV q12h Monitor renal function and cx data  Continue Flagyl  [place] Crane, Elyssia Strausser A 11/17/2014,11:50 AM

## 2014-11-17 NOTE — Care Management Note (Addendum)
    Page 1 of 1   11/18/2014     11:01:23 AM CARE MANAGEMENT NOTE 11/18/2014  Patient:  Martha Becker, Martha Becker   Account Number:  [id number]  Date Initiated:  11/17/2014  Documentation initiated by:  Theophilus Kinds  Subjective/Objective Assessment:   Pt admitted from home with colitis. Pt lives alone and will return home at discharge. Pt is independent with ADL's.     Action/Plan:   Anticipate discharge within 24 hours. No CM needs noted.   Anticipated DC Date:  11/18/2014   Anticipated DC Plan:  [place]  CM consult      Choice offered to / List presented to:             Status of service:  Completed, signed off Medicare Important Message given?   (If response is "NO", the following Medicare IM given date fields will be blank) Date Medicare IM given:   Medicare IM given by:   Date Additional Medicare IM given:   Additional Medicare IM given by:    Discharge Disposition:  HOME/SELF CARE  Per UR Regulation:    If discussed at Long Length of Stay Meetings, dates discussed:    Comments:  11/18/14 [place], RN BSN CM Pt discharged home today. No CM needs noted.  11/17/14 [place], RN BSN CM

## 2014-11-17 NOTE — Telephone Encounter (Signed)
Called Martha Becker to follow up on her diarrhea. Informed she is inpatient at [facility] since 11/14/14 with "bowel infection". Currently on IV fluids and Flagyl and Cipro. Still not able to eat solid foods-just liquids. Will be trying to get up and walk more today-feeling weak. Encouraged her to try to get more active so her muscle strength will not lessen and decrease her blood clot risk. Will make Dr. Benay Spice aware.

## 2014-11-18 ENCOUNTER — Telehealth: Payer: Self-pay | Admitting: *Deleted

## 2014-11-18 DIAGNOSIS — R197 Diarrhea, unspecified: Secondary | ICD-10-CM | POA: Insufficient documentation

## 2014-11-18 LAB — GI PATHOGEN PANEL BY PCR, STOOL
C difficile toxin A/B: NOT DETECTED
CRYPTOSPORIDIUM BY PCR: NOT DETECTED
Campylobacter by PCR: NOT DETECTED
E coli (ETEC) LT/ST: NOT DETECTED
E coli (STEC): NOT DETECTED
E coli 0157 by PCR: NOT DETECTED
G lamblia by PCR: NOT DETECTED
Norovirus GI/GII: NOT DETECTED
Rotavirus A by PCR: NOT DETECTED
SHIGELLA BY PCR: NOT DETECTED
Salmonella by PCR: NOT DETECTED

## 2014-11-18 MED ORDER — METRONIDAZOLE 500 MG PO TABS: 500.0000 mg | ORAL_TABLET | Freq: Three times a day (TID) | ORAL | Status: DC

## 2014-11-18 MED ORDER — RISAQUAD PO CAPS: 1.0000 | ORAL_CAPSULE | Freq: Two times a day (BID) | ORAL | Status: DC

## 2014-11-18 MED ORDER — LORAZEPAM 2 MG/ML IJ SOLN
1.0000 mg | Freq: Once | INTRAMUSCULAR | Status: AC
  Administered 2014-11-18: 1 mg via INTRAVENOUS
  Filled 2014-11-18: qty 1

## 2014-11-18 MED ORDER — PANTOPRAZOLE SODIUM 40 MG PO TBEC: 40.0000 mg | DELAYED_RELEASE_TABLET | Freq: Every day | ORAL | Status: DC

## 2014-11-18 MED ORDER — ONDANSETRON HCL 4 MG PO TABS: 4.0000 mg | ORAL_TABLET | Freq: Four times a day (QID) | ORAL | Status: DC | PRN

## 2014-11-18 MED ORDER — CIPROFLOXACIN HCL 500 MG PO TABS: 500.0000 mg | ORAL_TABLET | Freq: Two times a day (BID) | ORAL | Status: DC

## 2014-11-18 NOTE — Telephone Encounter (Signed)
Call to follow up on status. Just released from hospital today on cipro and flagyl. Taking in clear liquids only. Has had one loose stool since she came home today. Says she is not physically able to take chemo on 11/20/14. Confirmed that she probably needs another week to recover. Suggested she keep her appointment with Dr. Benay Spice on 11/20/14 to be sure she is doing well before the weekend and she has many questions and concerns for him. She wants to discuss "proton therapy" with Dr. Benay Spice, believing this would help her with less side effects. Asking when to follow up with Dr. Benay Spice if she is not able to come in on Friday?

## 2014-11-18 NOTE — Discharge Summary (Signed)
Physician Discharge Summary  Martha Becker LGX:[id number] DOB: [birth year] DOA: 11/14/2014  PCP: [name], MD  Admit date: 11/14/2014 Discharge date: 11/18/2014  Time spent: 40 minutes  Recommendations for Outpatient Follow-up:  1. Follow up with oncology on 3/11 as previously scheduled  Discharge Diagnoses:  Active Problems:   Colitis   Dehydration   Hyponatremia   Hypokalemia   Hypomagnesemia   Diarrhea   Discharge Condition:improved  Diet recommendation: low salt  Filed Weights   11/14/14 1650 11/14/14 2319 11/17/14 0634  Weight: 63.504 kg (140 lb) 68.8 kg (151 lb 10.8 oz) 68.947 kg (152 lb)    History of present illness:  This patient was admitted with diarrhea, increasing watery stools and abdominal pain. CT scan of the abdomen and pelvis indicated enteritis. She recently underwent chemotherapy. She was also having nausea and vomiting and wasn't able to keep down any medications. She was admitted for further treatments  Hospital Course:  Patient was started on intravenous antibiotics for colitis including ciprofloxacin and Flagyl. Stool studies were found to be negative. It is possible that she still has an infectious component to her colitis, although her chemotherapy could also be playing a role. She was treated supportively with IV fluids and her potassium was corrected. Her dehydration has now resolved. She was continued on antiemetics and is now tolerating full liquid/soft foods. Plans are to follow-up with oncology to further discuss her chemotherapy regimen. She has not had any fevers or leukocytosis. She is otherwise stable for discharge today.  Procedures:  Consultations:    Discharge Exam: Filed Vitals:   11/18/14 0626  BP: 100/64  Pulse: 69  Temp: 98.2 F (36.8 C)  Resp: 16    General:NAD Cardiovascular: S1, S2 RRR Respiratory: CTA B  Discharge Instructions   Discharge Instructions    Call MD for:  persistant nausea and vomiting    Complete by:   As directed      Call MD for:  severe uncontrolled pain    Complete by:  As directed      Call MD for:  temperature >100.4    Complete by:  As directed      Diet - low sodium heart healthy    Complete by:  As directed      Increase activity slowly    Complete by:  As directed           Discharge Medication List as of 11/18/2014 12:12 PM    START taking these medications   Details  ciprofloxacin (CIPRO) 500 MG tablet Take 1 tablet (500 mg total) by mouth 2 (two) times daily., Starting 11/18/2014, Until Discontinued, Print    metroNIDAZOLE (FLAGYL) 500 MG tablet Take 1 tablet (500 mg total) by mouth 3 (three) times daily., Starting 11/18/2014, Until Discontinued, Print    pantoprazole (PROTONIX) 40 MG tablet Take 1 tablet (40 mg total) by mouth daily., Starting 11/18/2014, Until Discontinued, Print      CONTINUE these medications which have CHANGED   Details  acidophilus (RISAQUAD) CAPS capsule Take 1 capsule by mouth 2 (two) times daily., Starting 11/18/2014, Until Discontinued, Print    ondansetron (ZOFRAN) 4 MG tablet Take 1 tablet (4 mg total) by mouth every 6 (six) hours as needed for nausea or vomiting., Starting 11/18/2014, Until Discontinued, Print      CONTINUE these medications which have NOT CHANGED   Details  diphenoxylate-atropine (LOMOTIL) 2.5-0.025 MG per tablet Take 1 tablet by mouth 4 (four) times daily as needed for diarrhea or loose  stools., Starting 11/10/2014, Until Discontinued, Print    loperamide (IMODIUM) 2 MG capsule Take 2-4 mg by mouth as needed for diarrhea or loose stools., Until Discontinued, Historical Med    oxyCODONE-acetaminophen (PERCOCET) 7.5-325 MG per tablet Take 1-2 tablets by mouth every 4 (four) hours as needed., Starting 09/03/2014, Until Discontinued, Print    PRESCRIPTION MEDICATION Oxaliplatin at [place] [facility]., Until Discontinued, Historical Med    prochlorperazine (COMPAZINE) 10 MG tablet Take 1 tablet (10 mg total) by mouth  every 6 (six) hours as needed for nausea or vomiting., Starting 11/06/2014, Until Discontinued, Normal       Allergies  Allergen Reactions  . Codeine Nausea And Vomiting   Follow-up Information    Follow up with Betsy Coder, MD.   Specialty:  Oncology   Why:  as scheduled   Contact information:   [address] [place] [id number] [phone]        The results of significant diagnostics from this hospitalization (including imaging, microbiology, ancillary and laboratory) are listed below for reference.    Significant Diagnostic Studies: Dg Chest 2 View  11/14/2014   CLINICAL DATA:  Initial evaluation for weakness, shortness of breath. History of colon cancer.  Lungs are normally inflated. No focal infiltrate, pulmonary edema, or pleural effusion.  EXAM: CHEST  2 VIEW  COMPARISON:  Prior study from 08/28/2014.  FINDINGS: Cardiac and mediastinal silhouettes are stable in size and contour, and remain within normal limits.  Lungs are normally inflated. No focal infiltrate, pulmonary edema, or pleural effusion. No pneumothorax.  Multiple bilateral pulmonary nodules again seen, which appear overall increased in size and number as compared to prior radiograph from 08/28/2014. The largest nodule in the right lung located in the right lower lobe and measures 18 mm, previously 8 mm. Largest in the left lung located in the left lower lobe measuring 11 mm, previously approximately 8 mm. New nodule evident within the left upper lobe measures 11 mm.  No acute osseus abnormality. No worrisome lytic or blastic osseous lesions. Prominent gaseous distention noted within several loops of bowel in the left upper quadrant.  IMPRESSION: 1. No active cardiopulmonary disease. 2. Increased size and number of multiple bilateral pulmonary nodules, concerning for progressive metastatic disease.   Electronically Signed   By: Jeannine Boga M.D.   On: 11/14/2014 22:10   Ct Abdomen Pelvis W  Contrast  11/14/2014   CLINICAL DATA:  Worsening weakness, with nausea, vomiting and diarrhea. Current history of colon cancer with metastatic disease, on chemotherapy. Initial encounter.  EXAM: CT ABDOMEN AND PELVIS WITH CONTRAST  TECHNIQUE: Multidetector CT imaging of the abdomen and pelvis was performed using the standard protocol following bolus administration of intravenous contrast.  CONTRAST:  49mL OMNIPAQUE IOHEXOL 300 MG/ML SOLN, 153mL OMNIPAQUE IOHEXOL 300 MG/ML SOLN  COMPARISON:  PET/CT performed 09/22/2014  FINDINGS: Scattered pulmonary nodules at the lung bases are stable or mildly worsened from the prior study, with a few new pulmonary nodules seen. This is compatible with metastatic disease.  Multiple hepatic masses are seen, measuring up to 3.7 cm. These appear mostly new from the prior study. This is compatible with metastatic disease. The liver and spleen are unremarkable in appearance. The pancreas and adrenal glands are within normal limits.  Retroperitoneal lymphadenopathy is apparently mildly improved from the prior study. Matted lymphadenopathy still measures up to 4.0 cm in size.  The kidneys are unremarkable in appearance. There is no evidence of hydronephrosis. No renal or ureteral  stones are seen. No perinephric stranding is appreciated.  There is diffuse wall thickening and soft tissue inflammation along the mid to distal ileum, to the level of the patient's ileocolic anastomosis at the mid abdomen. This is compatible with acute infectious or inflammatory ileitis. Associated prominent mesenteric nodes may reflect the acute infectious or inflammatory process, or possibly metastatic disease.  The more proximal small bowel is unremarkable in appearance. The stomach is within normal limits. No acute vascular abnormalities are seen.  The remaining colon is partially filled with fluid and air, and is unremarkable in appearance. The sigmoid colon is mildly redundant.  The bladder is moderately  distended grossly remarkable in appearance. The uterus is grossly unremarkable. The ovaries are relatively symmetric. No suspicious adnexal masses are seen. No inguinal lymphadenopathy is seen.  No acute osseous abnormalities are identified.  IMPRESSION: 1. Acute infectious or inflammatory ileitis involving the mid distal ileum, with diffuse wall thickening and soft tissue inflammation, to the level of the patient's ileocolic anastomosis. 2. Associated prominent mesenteric nodes may reflect the acute infectious or inflammatory process, or possibly metastatic disease. 3. Multiple hepatic masses, measuring up to 3.7 cm. These are mostly new from the prior study and compatible with metastatic disease. 4. Scattered pulmonary nodules at the lung bases are stable or mildly worsened from the prior study, compatible with metastatic disease. 5. Retroperitoneal lymphadenopathy is mildly improved from prior study. Matted lymphadenopathy still measures up to 4.0 cm in size.   Electronically Signed   By: Garald Balding M.D.   On: 11/14/2014 21:00    Microbiology: Recent Results (from the past 240 hour(s))  Clostridium Difficile by PCR     Status: None   Collection Time: 11/11/14  3:31 PM  Result Value Ref Range Status   C difficile by pcr NEGATIVE NEGATIVE Final  Blood culture (routine x 2)     Status: None (Preliminary result)   Collection Time: 11/14/14  5:30 PM  Result Value Ref Range Status   Specimen Description BLOOD RIGHT ARM  Final   Special Requests BOTTLES DRAWN AEROBIC AND ANAEROBIC 6CC  Final   Culture NO GROWTH 4 DAYS  Final   Report Status PENDING  Incomplete  Blood culture (routine x 2)     Status: None (Preliminary result)   Collection Time: 11/14/14  5:30 PM  Result Value Ref Range Status   Specimen Description BLOOD RIGHT HAND  Final   Special Requests BOTTLES DRAWN AEROBIC AND ANAEROBIC 6CC  Final   Culture NO GROWTH 4 DAYS  Final   Report Status PENDING  Incomplete  Urine culture      Status: None   Collection Time: 11/14/14  8:10 PM  Result Value Ref Range Status   Specimen Description URINE, CLEAN CATCH  Final   Special Requests NONE  Final   Colony Count NO GROWTH Performed at [facility]   Final   Culture NO GROWTH Performed at [facility]   Final   Report Status 11/16/2014 FINAL  Final     Labs: Basic Metabolic Panel:  Recent Labs Lab 11/14/14 1709 11/14/14 1831 11/15/14 0637 11/16/14 0558 11/17/14 0658  NA 128*  --  131* 133* 132*  K 2.4*  --  3.2* 4.8 4.0  CL 91*  --  101 102 101  CO2 28  --  24 23 24   GLUCOSE 108*  --  89 83 98  BUN 7  --  5* 6 <5*  CREATININE 0.74  --  0.53 0.56 0.54  CALCIUM 8.0*  --  7.7* 8.1* 8.2*  MG  --  1.4* 1.8  --   --    Liver Function Tests:  Recent Labs Lab 11/14/14 1709  AST 15  ALT 12  ALKPHOS 56  BILITOT 0.5  PROT 5.4*  ALBUMIN 2.6*    Recent Labs Lab 11/14/14 1709  LIPASE 43   No results for input(s): AMMONIA in the last 168 hours. CBC:  Recent Labs Lab 11/14/14 1709 11/16/14 0558 11/17/14 0658  WBC 8.2 10.9* 7.0  NEUTROABS 4.8  --   --   HGB 11.4* 10.3* 10.9*  HCT 33.2* 31.0* 32.2*  MCV 81.2 81.8 81.7  PLT 277 324 347   Cardiac Enzymes: No results for input(s): CKTOTAL, CKMB, CKMBINDEX, TROPONINI in the last 168 hours. BNP: BNP (last 3 results) No results for input(s): BNP in the last 8760 hours.  ProBNP (last 3 results) No results for input(s): PROBNP in the last 8760 hours.  CBG: No results for input(s): GLUCAP in the last 168 hours.     Signed:  MEMON,JEHANZEB  [facility] 11/18/2014, 8:08 PM

## 2014-11-19 ENCOUNTER — Ambulatory Visit: Payer: 59 | Admitting: Oncology

## 2014-11-19 ENCOUNTER — Telehealth: Payer: Self-pay | Admitting: *Deleted

## 2014-11-19 ENCOUNTER — Other Ambulatory Visit: Payer: 59

## 2014-11-19 ENCOUNTER — Other Ambulatory Visit: Payer: Self-pay | Admitting: *Deleted

## 2014-11-19 DIAGNOSIS — C180 Malignant neoplasm of cecum: Secondary | ICD-10-CM

## 2014-11-19 LAB — CULTURE, BLOOD (ROUTINE X 2)
CULTURE: NO GROWTH
CULTURE: NO GROWTH

## 2014-11-19 NOTE — Progress Notes (Signed)
UR chart review completed.  

## 2014-11-19 NOTE — Telephone Encounter (Signed)
Called pt to follow up after hospital discharge. Requested she call office. Dr. Benay Spice recommends keeping 3/11appointment as scheduled.

## 2014-11-20 ENCOUNTER — Ambulatory Visit: Payer: 59 | Admitting: Oncology

## 2014-11-20 ENCOUNTER — Encounter: Payer: 59 | Admitting: Nutrition

## 2014-11-20 ENCOUNTER — Ambulatory Visit: Payer: 59

## 2014-11-20 ENCOUNTER — Encounter: Payer: Self-pay | Admitting: Nutrition

## 2014-11-20 ENCOUNTER — Other Ambulatory Visit: Payer: Self-pay | Admitting: *Deleted

## 2014-11-20 ENCOUNTER — Other Ambulatory Visit: Payer: 59

## 2014-11-20 ENCOUNTER — Telehealth: Payer: Self-pay | Admitting: *Deleted

## 2014-11-20 NOTE — Telephone Encounter (Signed)
Martha Becker with new appointment for Tuesday at 11:30/12:00. POF to scheduler to try to get her chemo in that day after visit. She reports she is feeling better today and that her son came in to see her. He has to fly back home on Tuesday, so she hopes she can make her appointment. Verbalizes that she can't go through what happened with the first chemo, saying "I feel like I almost died". Has a lot to discuss with MD. Encouraged her to keep her appointment and to enjoy her weekend with her son.

## 2014-11-20 NOTE — Progress Notes (Signed)
Nutrition follow-up could not be completed.  Patient did not show up for scheduled appointments today.

## 2014-11-23 ENCOUNTER — Telehealth: Payer: Self-pay | Admitting: *Deleted

## 2014-11-23 ENCOUNTER — Other Ambulatory Visit: Payer: Self-pay | Admitting: *Deleted

## 2014-11-23 ENCOUNTER — Encounter: Payer: Self-pay | Admitting: *Deleted

## 2014-11-23 NOTE — Telephone Encounter (Signed)
Per staff message and POF I have scheduled appts. Advised scheduler of appts. JMW  

## 2014-11-23 NOTE — Telephone Encounter (Signed)
Requests a letter be sent to her HR manager, Leron Croak at fax [phone] regarding missing work on intermittent basis for treatment. Also requests that we request HR fax office an FMLA form to be completed by MD. Letter printed and given to MD for signature.

## 2014-11-24 ENCOUNTER — Ambulatory Visit: Payer: 59

## 2014-11-24 ENCOUNTER — Telehealth: Payer: Self-pay | Admitting: Oncology

## 2014-11-24 ENCOUNTER — Encounter: Payer: Self-pay | Admitting: *Deleted

## 2014-11-24 ENCOUNTER — Ambulatory Visit (HOSPITAL_BASED_OUTPATIENT_CLINIC_OR_DEPARTMENT_OTHER): Payer: 59 | Admitting: Oncology

## 2014-11-24 ENCOUNTER — Other Ambulatory Visit (HOSPITAL_BASED_OUTPATIENT_CLINIC_OR_DEPARTMENT_OTHER): Payer: 59

## 2014-11-24 ENCOUNTER — Other Ambulatory Visit: Payer: Self-pay | Admitting: Oncology

## 2014-11-24 ENCOUNTER — Telehealth: Payer: Self-pay | Admitting: *Deleted

## 2014-11-24 VITALS — BP 130/77 | HR 78 | Temp 98.2°F | Resp 20 | Ht 66.0 in | Wt 139.8 lb

## 2014-11-24 DIAGNOSIS — C180 Malignant neoplasm of cecum: Secondary | ICD-10-CM

## 2014-11-24 DIAGNOSIS — C7800 Secondary malignant neoplasm of unspecified lung: Secondary | ICD-10-CM

## 2014-11-24 DIAGNOSIS — C786 Secondary malignant neoplasm of retroperitoneum and peritoneum: Secondary | ICD-10-CM

## 2014-11-24 DIAGNOSIS — C182 Malignant neoplasm of ascending colon: Secondary | ICD-10-CM

## 2014-11-24 DIAGNOSIS — K769 Liver disease, unspecified: Secondary | ICD-10-CM

## 2014-11-24 LAB — COMPREHENSIVE METABOLIC PANEL (CC13)
ALBUMIN: 3.2 g/dL — AB (ref 3.5–5.0)
ALT: 62 U/L — ABNORMAL HIGH (ref 0–55)
AST: 30 U/L (ref 5–34)
Alkaline Phosphatase: 92 U/L (ref 40–150)
Anion Gap: 11 mEq/L (ref 3–11)
BILIRUBIN TOTAL: 0.31 mg/dL (ref 0.20–1.20)
BUN: 11.3 mg/dL (ref 7.0–26.0)
CHLORIDE: 104 meq/L (ref 98–109)
CO2: 23 mEq/L (ref 22–29)
Calcium: 9.5 mg/dL (ref 8.4–10.4)
Creatinine: 0.7 mg/dL (ref 0.6–1.1)
Glucose: 87 mg/dl (ref 70–140)
POTASSIUM: 4.2 meq/L (ref 3.5–5.1)
SODIUM: 138 meq/L (ref 136–145)
Total Protein: 6.3 g/dL — ABNORMAL LOW (ref 6.4–8.3)

## 2014-11-24 LAB — CBC WITH DIFFERENTIAL/PLATELET
BASO%: 0.6 % (ref 0.0–2.0)
BASOS ABS: 0 10*3/uL (ref 0.0–0.1)
EOS ABS: 0.1 10*3/uL (ref 0.0–0.5)
EOS%: 2.1 % (ref 0.0–7.0)
HCT: 34 % — ABNORMAL LOW (ref 34.8–46.6)
HGB: 11.6 g/dL (ref 11.6–15.9)
LYMPH%: 28 % (ref 14.0–49.7)
MCH: 28 pg (ref 25.1–34.0)
MCHC: 34.1 g/dL (ref 31.5–36.0)
MCV: 81.9 fL (ref 79.5–101.0)
MONO#: 0.7 10*3/uL (ref 0.1–0.9)
MONO%: 10.5 % (ref 0.0–14.0)
NEUT#: 3.6 10*3/uL (ref 1.5–6.5)
NEUT%: 58.8 % (ref 38.4–76.8)
Platelets: 250 10*3/uL (ref 145–400)
RBC: 4.15 10*6/uL (ref 3.70–5.45)
RDW: 16.7 % — AB (ref 11.2–14.5)
WBC: 6.2 10*3/uL (ref 3.9–10.3)
lymph#: 1.7 10*3/uL (ref 0.9–3.3)

## 2014-11-24 NOTE — Progress Notes (Signed)
Martha Becker has decided to get the [facility] and proceed with the FOLFOX/Avastin regimen due to poor tolerance of the CAPOX. Faxed referral and office notes/labs to Dr. Aviva Signs [phone] requesting PAC placement asap. Plan is to treat her on 11/30/14 (Dr. Benay Spice will hold Avastin with this tx). Spoke with her about the infusion pump and how it looks and that she will need to have it disconnected 46 hours after started. She wants to have pump D/C'd at [facility] so she won't have to drive back until next treatment. She does not want to transfer her care to [facility] Spoke with nurse manager, Hilario Quarry and she will check with [facility] to see if they are able to do this for her every 2 weeks.  Spoke with Chi regarding her current situation and her primary concern is of her finances. 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 Her social security disability will start in June. She insists she does not want to go on Medicaid and lose her property and home. Afraid that her credit will be ruined because she can't pay these medical bills.She has a strong need to be in control. Became tearful when she expressed how out of control her life is and talked about the difficult divorce and death of her son. Encouraged her to recall how she coped with these stresses and call upon that strength for this battle also. She requests call from social worker-has questions for her. Encouraged her to call her HR representative to determine how long they will hold her job for her and maybe they can assist in some planning. Informed her this RN will check couple websites for any financial assistance in relation to colon cancer.

## 2014-11-24 NOTE — Telephone Encounter (Signed)
Pt confirmed labs/ov per 03/15 POF, gave pt AVS and calendar..... KJ, sent msg to add chemo

## 2014-11-24 NOTE — Progress Notes (Signed)
[place] OFFICE PROGRESS NOTE   Diagnosis: Colon cancer  INTERVAL HISTORY:   Martha Becker had persistent diarrhea after the last office visit here. She also reports episodes of nausea and vomiting. She was admitted 11/14/2014 with increased watery diarrhea. Lomotil and Imodium did not help. She had cramping abdominal discomfort prior to hospital admission. She was noted to have hypokalemia on hospital admission. She was treated with antibiotics and IV fluids. The diarrhea improved and she was tolerating a diet prior to discharge. Cultures of the blood and urine returned negative. A stool sample for the C. difficile toxin was negative on 11/11/2014.  A CT of the abdomen and pelvis 11/14/2014 was compared to a PET scan from 09/22/2014. Scattered nodules at the lung bases appeared mildly progressive consistent with metastatic disease. Multiple hepatic masses were noted and appeared new from the PET scan. Retroperitoneal lymphadenopathy appeared mildly improved. Diffuse wall thickening and soft tissue inflammation was noted along the mid to distal ileum. No acute vascular abnormality.  She reports exertional dyspnea over the past week that has improved. Fatigue has also improved. She had a formed bowel movement yesterday. She noted a low-grade fever prior to hospital admission. No bleeding or symptom of venous/arterial thrombosis.  Objective:  Vital signs in last 24 hours:  Blood pressure 130/77, pulse 78, temperature 98.2 F (36.8 C), temperature source Oral, resp. rate 20, height 5' 6" (1.676 m), weight 139 lb 12.8 oz (63.413 kg), SpO2 99 %.    HEENT: No thrush or ulcers Lymphatics: No cervical or supraclavicular nodes Resp: Lungs clear bilaterally Cardio: Regular rate and rhythm GI: No hepatomegaly, nontender, no mass Vascular: No leg edema or calf tenderness/erythema  Skin: Palms without erythema     Lab Results:  Lab Results  Component Value Date   WBC 6.2  11/24/2014   HGB 11.6 11/24/2014   HCT 34.0* 11/24/2014   MCV 81.9 11/24/2014   PLT 250 11/24/2014   NEUTROABS 3.6 11/24/2014      Lab Results  Component Value Date   CEA 9.7* 10/22/2014    Imaging:  No results found.  Medications: I have reviewed the patient's current medications.  Assessment/Plan: 1. 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 Elevated TSH, PET scan 09/22/2014 with diffuse increased metabolic uptake in the thyroid   3. Family history of multiple cancers including colon cancer in her paternal grandfather and rectal cancer in a paternal first cousin  73. Admission 11/14/2014 with nausea and diarrhea, most likely secondary to capecitabine induced enteritis-resolve  CT 11/14/2014 consistent with ileitis  Disposition:  Martha Becker has metastatic colon cancer. She completed 1 cycle of CAPOX beginning 10/29/2014. The chemotherapy was compensated by severe diarrhea prompting hospital admission. I suspect the diarrhea and small bowel changes on CT are related to a chemotherapy-induced enteritis. The differential diagnosis includes a viral infection. Her symptoms have resolved.  We discussed resuming CAPOX with a dose reduction of the capecitabine. She is reluctant to take capecitabine again. I recommend proceeding with  FOLFOX/Avastin. She agrees. We will eliminate the  bolus 5-FU and dose reduce the 5-FU infusion with the first cycle. She will be referred for placement of a Port-A-Cath prior to beginning treatment.  A first cycle of FOLFOX/Avastin will be scheduled for 11/30/2014. She will return for an office visit prior to the next cycle on 12/14/2014.  We reviewed the 11/14/2014 CT findings. It is unclear whether the lung/liver findings represent disease progression since there had been a significant interval between the PET scan and the start of chemotherapy. The plan is to obtain a restaging CT after approximately 4 cycles of FOLFOX/Avastin.  Approximately 45 minutes were spent with the patient today. The majority of the time was used for counseling and coordination of care.  Betsy Coder, MD  11/24/2014  1:21 PM

## 2014-11-24 NOTE — Telephone Encounter (Signed)
lmonvm for pt re next appt for 3/21. avs report and appts mailed.

## 2014-11-24 NOTE — Telephone Encounter (Signed)
Requested by patient to revise letter since employer will only allow her to work full time or no time according to patient.

## 2014-11-25 ENCOUNTER — Telehealth: Payer: Self-pay | Admitting: *Deleted

## 2014-11-25 NOTE — Telephone Encounter (Signed)
Lyrik called about the [facility] scheduled for 11/30/14. Dr. Arnoldo Morale worked her in for [phone] and will leave accessed for treatment in [place] at 1:45 pm. She was tearful and said "I just can't do that. I know I can't have the PAC and chemo in the same day". Confirmed with her that the [facility] placement date is as soon as possible and this can't be changed. Told her we can, however move her chemo to another day. She is requesting to move it to Wednesday and have pump d/c'd on Friday at [facility]. At this time [facility] has declined our request for the pump D/C there. She is very upset that she has to drive 40 minutes both ways and then be here almost an hour to d/c the pump. Informed her that it may be her only option. Medical director is talking with facility to determine if will reconsider. Very upset regarding her lack of control over the situation. Made her aware that there are some things she can't control, but we will allow her control over those things that can be changed, such as treatment day.  Worried that something else is wrong because she gets tired easily. Reminded her she has had a huge surgery and chemotherapy causes this fatigue and she has not fully recovered from her diarrhea. She needs to balance activity with rest and realize there are some things that are not that urgent to do and put those on the back burner and focus on what is important. Left conversation that I will call her back tomorrow with [facility] decision, but that we will not do her chemo on Monday now. Time spent on phone 30 minutes.

## 2014-11-26 ENCOUNTER — Encounter (HOSPITAL_COMMUNITY)
Admission: RE | Admit: 2014-11-26 | Discharge: 2014-11-26 | Disposition: A | Discharge status: Home / Self Care | Payer: 59 | Source: Ambulatory Visit | Attending: General Surgery | Admitting: General Surgery

## 2014-11-26 ENCOUNTER — Encounter (HOSPITAL_COMMUNITY): Payer: Self-pay

## 2014-11-26 ENCOUNTER — Telehealth: Payer: Self-pay | Admitting: *Deleted

## 2014-11-26 NOTE — Telephone Encounter (Signed)
Also called Martha Becker at Dr. Arnoldo Morale office earlier today and told them to not leave port accessed on Monday. She will be having her chemo on Wednesday instead. She does not want chemo on same day as the port.

## 2014-11-26 NOTE — Telephone Encounter (Signed)
Left VM for patient that [facility] will not be able to do her pump D/C since she is not an active patient there per Dr. Margot Chimes after talking with director there. Will need to have her pump d/c'd in [place]. Will move her chemo to Wednesday, 3/23 with pump D/C on 3/25 unless she calls tomorrow and says otherwise.

## 2014-11-27 ENCOUNTER — Encounter: Payer: Self-pay | Admitting: *Deleted

## 2014-11-27 ENCOUNTER — Other Ambulatory Visit: Payer: Self-pay | Admitting: *Deleted

## 2014-11-27 ENCOUNTER — Telehealth: Payer: Self-pay | Admitting: *Deleted

## 2014-11-27 MED ORDER — LIDOCAINE-PRILOCAINE 2.5-2.5 % EX CREA: TOPICAL_CREAM | CUTANEOUS | Status: DC

## 2014-11-27 NOTE — Progress Notes (Signed)
Martha Becker  Clinical Social Becker was referred by nurse for assessment of psychosocial needs.  Clinical Social Worker phoned patient to offer support and assess for needs.  She is doing ok and actually open to possible transportation assistance. CSW reviewed option to use [facility] to bring her to future appointments. She will consider contacting them. Pt is concerned about finances and has met Martha Becker to discuss the grant. She is getting worried about medical bills, but is still not interested in applying medicaid currently. CSW reviewed other options such as [facility] and others. CSW provided supportive listening and will follow up with pt at next chemo.   Clinical Social Becker interventions:  Supportive listening and resource education  Martha Becker, [place] Worker [place]  [place] Phone: [phone] Fax: [phone]

## 2014-11-27 NOTE — H&P (Signed)
Martha Becker is an 59 y.o. female.   Chief Complaint: Colon carcinoma, need for central venous access for chemotherapy HPI: Patient is a 59 year old female who presents for a Port-A-Cath insertion. 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 She is about to undergo chemotherapy.  Past Medical History  Diagnosis Date  . Colon cancer     Past Surgical History  Procedure Laterality Date  . Cesarean section    . Partial colectomy N/A 08/31/2014    Procedure: Right Colon Resection ;  Surgeon: Jamesetta So, MD;  Location: [facility];  Service: General;  Laterality: N/A;  . Liver biopsy N/A 08/31/2014    Procedure: LIVER BIOPSY;  Surgeon: Jamesetta So, MD;  Location: [facility];  Service: General;  Laterality: N/A;    Family History  Problem Relation Age of Onset  . Colon cancer Cousin     age late 26s  . Inflammatory bowel disease Neg Hx    Social History:  reports that she quit smoking about 20 years ago. Her smoking use included Cigarettes. She has a 27 pack-year smoking history. She does not have any smokeless tobacco history on file. She reports that she drinks about 0.6 oz of alcohol per week. She reports that she does not use illicit drugs.  Allergies:  Allergies  Allergen Reactions  . Codeine Nausea And Vomiting    No prescriptions prior to admission    No results found for this or any previous visit (from the past 48 hour(s)). No results found.  Review of Systems  Unable to perform ROS   There were no vitals taken for this visit. Physical Exam  Constitutional: She appears well-developed and well-nourished.  HENT:  Head: Normocephalic and atraumatic.  Neck: Normal range of motion. Neck supple.  Cardiovascular: Normal rate, regular rhythm and normal heart sounds.   Respiratory: Effort normal and breath sounds normal.  GI: Soft. Bowel sounds are normal.  Skin: Skin is warm and dry.     Assessment/Plan Impression: Stage IV colon carcinoma, need  for central venous access Plan: Patient scheduled for a Port-A-Cath insertion on 11/30/2014. The risks and benefits of the procedure including bleeding, infection, and pneumothorax were fully explained to the patient, who gave informed consent.  Don Giarrusso A 11/27/2014, 9:45 AM

## 2014-11-27 NOTE — Telephone Encounter (Signed)
Attempted to reach patient. Left VM that I will try again before I leave today.

## 2014-11-27 NOTE — Telephone Encounter (Signed)
Called back: she wanted to confirm that he was decreasing the dose of oxaliplatin. Informed her that this drug did not cause any adverse event, but it is a lower dose since she gets it every 2 weeks instead of every 3 weeks. Also informed her that the Avastin will be held with this treatment due to [facility] placement. Asking nurse to promise her she is not going to have the difficult time she did with last chemo. Informed her I can't promise her, but it is very unlikely she will have the severe diarrhea as she had with the Xeloda.  Expresses her disappointment that Forest[facility] Na will not do her pump D/C.

## 2014-11-27 NOTE — Telephone Encounter (Signed)
Left VM asking nurse to call her today. Has questions and issues to discuss.

## 2014-11-30 ENCOUNTER — Ambulatory Visit (HOSPITAL_COMMUNITY): Payer: 59

## 2014-11-30 ENCOUNTER — Telehealth: Payer: Self-pay | Admitting: Oncology

## 2014-11-30 ENCOUNTER — Encounter (HOSPITAL_COMMUNITY)
Admission: RE | Disposition: A | Discharge status: Home / Self Care | Payer: Self-pay | Source: Ambulatory Visit | Attending: General Surgery

## 2014-11-30 ENCOUNTER — Ambulatory Visit (HOSPITAL_COMMUNITY): Payer: 59 | Admitting: Anesthesiology

## 2014-11-30 ENCOUNTER — Other Ambulatory Visit: Payer: 59

## 2014-11-30 ENCOUNTER — Other Ambulatory Visit: Payer: Self-pay | Admitting: *Deleted

## 2014-11-30 ENCOUNTER — Ambulatory Visit: Payer: 59

## 2014-11-30 ENCOUNTER — Encounter (HOSPITAL_COMMUNITY): Payer: Self-pay | Admitting: *Deleted

## 2014-11-30 ENCOUNTER — Ambulatory Visit (HOSPITAL_COMMUNITY)
Admission: RE | Admit: 2014-11-30 | Discharge: 2014-11-30 | Disposition: A | Discharge status: Home / Self Care | Payer: 59 | Source: Ambulatory Visit | Attending: General Surgery | Admitting: General Surgery

## 2014-11-30 DIAGNOSIS — Z95828 Presence of other vascular implants and grafts: Secondary | ICD-10-CM

## 2014-11-30 DIAGNOSIS — Z886 Allergy status to analgesic agent status: Secondary | ICD-10-CM | POA: Diagnosis not present

## 2014-11-30 DIAGNOSIS — C189 Malignant neoplasm of colon, unspecified: Secondary | ICD-10-CM | POA: Insufficient documentation

## 2014-11-30 HISTORY — DX: Hypothyroidism, unspecified: E03.9

## 2014-11-30 HISTORY — PX: PORTACATH PLACEMENT: SHX2246

## 2014-11-30 SURGERY — INSERTION, TUNNELED CENTRAL VENOUS DEVICE, WITH PORT
Anesthesia: Monitor Anesthesia Care | Site: Chest | Laterality: Right

## 2014-11-30 MED ORDER — LIDOCAINE HCL (PF) 1 % IJ SOLN
INTRAMUSCULAR | Status: AC
  Filled 2014-11-30: qty 30

## 2014-11-30 MED ORDER — GLYCOPYRROLATE 0.2 MG/ML IJ SOLN
INTRAMUSCULAR | Status: AC
  Filled 2014-11-30: qty 2

## 2014-11-30 MED ORDER — HEPARIN SOD (PORK) LOCK FLUSH 100 UNIT/ML IV SOLN
INTRAVENOUS | Status: AC
  Filled 2014-11-30: qty 5

## 2014-11-30 MED ORDER — PROPOFOL 10 MG/ML IV BOLUS
INTRAVENOUS | Status: AC
  Filled 2014-11-30: qty 20

## 2014-11-30 MED ORDER — ONDANSETRON HCL 4 MG/2ML IJ SOLN
4.0000 mg | Freq: Once | INTRAMUSCULAR | Status: DC | PRN
Start: 2014-11-30 — End: 2014-11-30

## 2014-11-30 MED ORDER — ONDANSETRON HCL 4 MG/2ML IJ SOLN
4.0000 mg | Freq: Once | INTRAMUSCULAR | Status: AC
  Administered 2014-11-30: 4 mg via INTRAVENOUS
  Filled 2014-11-30: qty 2

## 2014-11-30 MED ORDER — PROPOFOL INFUSION 10 MG/ML OPTIME
INTRAVENOUS | Status: DC | PRN
  Administered 2014-11-30: 175 ug/kg/min via INTRAVENOUS

## 2014-11-30 MED ORDER — ROCURONIUM BROMIDE 50 MG/5ML IV SOLN
INTRAVENOUS | Status: AC
  Filled 2014-11-30: qty 2

## 2014-11-30 MED ORDER — LIDOCAINE HCL (PF) 1 % IJ SOLN
INTRAMUSCULAR | Status: AC
  Filled 2014-11-30: qty 5

## 2014-11-30 MED ORDER — LACTATED RINGERS IV SOLN
INTRAVENOUS | Status: DC
  Administered 2014-11-30: 08:00:00 via INTRAVENOUS

## 2014-11-30 MED ORDER — KETOROLAC TROMETHAMINE 30 MG/ML IJ SOLN
30.0000 mg | Freq: Once | INTRAMUSCULAR | Status: DC
Start: 2014-11-30 — End: 2014-11-30

## 2014-11-30 MED ORDER — FENTANYL CITRATE 0.05 MG/ML IJ SOLN: 25.0000 ug | INTRAMUSCULAR | Status: DC | PRN

## 2014-11-30 MED ORDER — LIDOCAINE HCL (PF) 1 % IJ SOLN
INTRAMUSCULAR | Status: DC | PRN
  Administered 2014-11-30: 5 mL

## 2014-11-30 MED ORDER — MIDAZOLAM HCL 2 MG/2ML IJ SOLN
1.0000 mg | INTRAMUSCULAR | Status: DC | PRN
Start: 2014-11-30 — End: 2014-11-30
  Administered 2014-11-30 (×2): 2 mg via INTRAVENOUS
  Filled 2014-11-30 (×2): qty 2

## 2014-11-30 MED ORDER — CEFAZOLIN SODIUM-DEXTROSE 2-3 GM-% IV SOLR
2.0000 g | INTRAVENOUS | Status: AC
  Administered 2014-11-30: 2 g via INTRAVENOUS
  Filled 2014-11-30: qty 50

## 2014-11-30 MED ORDER — HEPARIN SOD (PORK) LOCK FLUSH 100 UNIT/ML IV SOLN
INTRAVENOUS | Status: DC | PRN
  Administered 2014-11-30: 500 [IU] via INTRAVENOUS

## 2014-11-30 MED ORDER — SODIUM CHLORIDE 0.9 % IR SOLN
Status: DC | PRN
  Administered 2014-11-30: 500 mL

## 2014-11-30 MED ORDER — FENTANYL CITRATE 0.05 MG/ML IJ SOLN
25.0000 ug | INTRAMUSCULAR | Status: AC
  Administered 2014-11-30 (×2): 25 ug via INTRAVENOUS
  Filled 2014-11-30: qty 2

## 2014-11-30 MED ORDER — NEOSTIGMINE METHYLSULFATE 10 MG/10ML IV SOLN
INTRAVENOUS | Status: AC
  Filled 2014-11-30: qty 1

## 2014-11-30 MED ORDER — LIDOCAINE HCL (CARDIAC) 10 MG/ML IV SOLN
INTRAVENOUS | Status: DC | PRN
  Administered 2014-11-30: 40 mg via INTRAVENOUS

## 2014-11-30 SURGICAL SUPPLY — 33 items
APPLIER CLIP 9.375 SM OPEN (CLIP)
BAG DECANTER FOR FLEXI CONT (MISCELLANEOUS) ×3 IMPLANT
BAG HAMPER (MISCELLANEOUS) ×3 IMPLANT
CATH HICKMAN DUAL 12.0 (CATHETERS) IMPLANT
CLIP APPLIE 9.375 SM OPEN (CLIP) IMPLANT
CLOTH BEACON ORANGE TIMEOUT ST (SAFETY) ×3 IMPLANT
COVER LIGHT HANDLE STERIS (MISCELLANEOUS) ×6 IMPLANT
DECANTER SPIKE VIAL GLASS SM (MISCELLANEOUS) ×3 IMPLANT
DRAPE C-ARM FOLDED MOBILE STRL (DRAPES) ×3 IMPLANT
DURAPREP 6ML APPLICATOR 50/CS (WOUND CARE) ×3 IMPLANT
ELECT REM PT RETURN 9FT ADLT (ELECTROSURGICAL) ×3
ELECTRODE REM PT RTRN 9FT ADLT (ELECTROSURGICAL) ×1 IMPLANT
GLOVE INDICATOR 7.5 STRL GRN (GLOVE) ×3 IMPLANT
GLOVE SURG SS PI 7.5 STRL IVOR (GLOVE) ×6 IMPLANT
GOWN STRL REUS W/TWL LRG LVL3 (GOWN DISPOSABLE) ×6 IMPLANT
IV NS 500ML (IV SOLUTION) ×2
IV NS 500ML BAXH (IV SOLUTION) ×1 IMPLANT
KIT PORT POWER 8FR ISP MRI (CATHETERS) ×3 IMPLANT
KIT ROOM TURNOVER APOR (KITS) ×3 IMPLANT
LIQUID BAND (GAUZE/BANDAGES/DRESSINGS) ×3 IMPLANT
MANIFOLD NEPTUNE II (INSTRUMENTS) ×3 IMPLANT
NEEDLE HYPO 25X1 1.5 SAFETY (NEEDLE) ×3 IMPLANT
PACK MINOR (CUSTOM PROCEDURE TRAY) ×3 IMPLANT
PAD ARMBOARD 7.5X6 YLW CONV (MISCELLANEOUS) ×3 IMPLANT
SET BASIN LINEN APH (SET/KITS/TRAYS/PACK) ×3 IMPLANT
SET INTRODUCER 12FR PACEMAKER (SHEATH) IMPLANT
SHEATH COOK PEEL AWAY SET 8F (SHEATH) IMPLANT
SUT PROLENE 3 0 PS 2 (SUTURE) IMPLANT
SUT VIC AB 3-0 SH 27 (SUTURE) ×2
SUT VIC AB 3-0 SH 27X BRD (SUTURE) ×1 IMPLANT
SUT VIC AB 4-0 PS2 27 (SUTURE) ×6 IMPLANT
SYR 20CC LL (SYRINGE) ×3 IMPLANT
SYRINGE 10CC LL (SYRINGE) ×6 IMPLANT

## 2014-11-30 NOTE — Anesthesia Procedure Notes (Signed)
Procedure Name: MAC Date/Time: 11/30/2014 9:16 AM Performed by: Vista Deck Pre-anesthesia Checklist: Patient identified, Emergency Drugs available, Suction available, Timeout performed and Patient being monitored Patient Re-evaluated:Patient Re-evaluated prior to inductionOxygen Delivery Method: Non-rebreather mask

## 2014-11-30 NOTE — Anesthesia Preprocedure Evaluation (Addendum)
Anesthesia Evaluation  Patient identified by MRN, date of birth, ID band Patient awake    Reviewed: Allergy & Precautions, H&P , NPO status , Patient's Chart, lab work & pertinent test results  Airway Mallampati: II  TM Distance: >3 FB     Dental  (+) Poor Dentition, Missing   Pulmonary former smoker,  Lung mets  breath sounds clear to auscultation        Cardiovascular negative cardio ROS  Rhythm:Regular Rate:Normal     Neuro/Psych    GI/Hepatic Obstructing colon neoplasm    Endo/Other  Hypothyroidism   Renal/GU      Musculoskeletal   Abdominal   Peds  Hematology  (+) anemia ,   Anesthesia Other Findings   Reproductive/Obstetrics                             Anesthesia Physical Anesthesia Plan  ASA: III  Anesthesia Plan: MAC   Post-op Pain Management:    Induction: Intravenous  Airway Management Planned: Simple Face Mask  Additional Equipment:   Intra-op Plan:   Post-operative Plan:   Informed Consent: I have reviewed the patients History and Physical, chart, labs and discussed the procedure including the risks, benefits and alternatives for the proposed anesthesia with the patient or authorized representative who has indicated his/her understanding and acceptance.     Plan Discussed with:   Anesthesia Plan Comments:        Anesthesia Quick Evaluation

## 2014-11-30 NOTE — Telephone Encounter (Signed)
per 3/18 pof r/s 3/21 appts to 3/23. s/w pt she is aware and will get new schedule 3/23.

## 2014-11-30 NOTE — Anesthesia Postprocedure Evaluation (Signed)
Anesthesia Post Note  Patient: Martha Becker  Procedure(s) Performed: Procedure(s) (LRB): INSERTION PORT-A-CATH right subclavian (Right)  Anesthesia type: MAC  Patient location: PACU  Post pain: Pain level controlled  Post assessment: Post-op Vital signs reviewed, Patient's Cardiovascular Status Stable, Respiratory Function Stable, Patent Airway, No signs of Nausea or vomiting and Pain level controlled  Last Vitals:  Filed Vitals:   11/30/14 1039  BP: 117/77  Pulse: 63  Temp: 36.6 C  Resp: 16    Post vital signs: Reviewed and stable  Level of consciousness: awake and alert   Complications: No apparent anesthesia complications . Late entry T. Nikola Blackston CRNA 11/30/2014 1150

## 2014-11-30 NOTE — Discharge Instructions (Signed)
Implanted Port Insertion, Care After Refer to this sheet in the next few weeks. These instructions provide you with information on caring for yourself after your procedure. Your health care provider may also give you more specific instructions. Your treatment has been planned according to current medical practices, but problems sometimes occur. Call your health care provider if you have any problems or questions after your procedure. WHAT TO EXPECT AFTER THE PROCEDURE After your procedure, it is typical to have the following:   Discomfort at the port insertion site. Ice packs to the area will help.  Bruising on the skin over the port. This will subside in 3-4 days. HOME CARE INSTRUCTIONS  After your port is placed, you will get a manufacturer's information card. The card has information about your port. Keep this card with you at all times.   Know what kind of port you have. There are many types of ports available.   Wear a medical alert bracelet in case of an emergency. This can help alert health care workers that you have a port.   The port can stay in for as long as your health care provider believes it is necessary.   A home health care nurse may give medicines and take care of the port.   You or a family member can get special training and directions for giving medicine and taking care of the port at home.  SEEK MEDICAL CARE IF:   Your port does not flush or you are unable to get a blood return.   You have a fever or chills. SEEK IMMEDIATE MEDICAL CARE IF:  You have new fluid or pus coming from your incision.   You notice a bad smell coming from your incision site.   You have swelling, pain, or more redness at the incision or port site.   You have chest pain or shortness of breath. Document Released: 06/18/2013 Document Revised: 09/02/2013 Document Reviewed: 06/18/2013 ExitCare Patient Information 2015 [employer]. This information is not intended to replace  advice given to you by your health care provider. Make sure you discuss any questions you have with your health care provider.  

## 2014-11-30 NOTE — Transfer of Care (Signed)
Immediate Anesthesia Transfer of Care Note  Patient: Martha Becker  Procedure(s) Performed: Procedure(s) (LRB): INSERTION PORT-A-CATH right subclavian (Right)  Patient Location: PACU  Anesthesia Type: MAC  Level of Consciousness: awake  Airway & Oxygen Therapy: Patient Spontanous Breathing. Nasal cannula  Post-op Assessment: Report given to PACU RN, Post -op Vital signs reviewed and stable and Patient moving all extremities  Post vital signs: Reviewed and stable  Complications: No apparent anesthesia complications

## 2014-11-30 NOTE — Interval H&P Note (Signed)
History and Physical Interval Note:  11/30/2014 8:13 AM  Martha Becker  has presented today for surgery, with the diagnosis of colon cancer  The various methods of treatment have been discussed with the patient and family. After consideration of risks, benefits and other options for treatment, the patient has consented to  Procedure(s) with comments: INSERTION PORT-A-CATH (N/A) - leave access - per Dr. Arnoldo Morale as a surgical intervention .  The patient's history has been reviewed, patient examined, no change in status, stable for surgery.  I have reviewed the patient's chart and labs.  Questions were answered to the patient's satisfaction.     Aviva Signs A

## 2014-11-30 NOTE — Op Note (Signed)
Patient:  Martha Becker  DOB:  [birth year]  MRN:  [id number]   Preop Diagnosis:  Colon carcinoma, need for central venous access  Postop Diagnosis:  Same  Procedure:  Port-A-Cath insertion  Surgeon:  Aviva Signs, M.D.  Anes:  Mac  Indications:  Patient is a 59 year old white female who is about to undergo chemotherapy for stage IV colon carcinoma. The risks and benefits of the procedure including bleeding, infection, and pneumothorax were fully explained to the patient, who gave informed consent.  Procedure note:  The patient was placed in the Trendelenburg position after the right upper chest was prepped and draped using usual sterile technique with ChloraPrep. Surgical site confirmation was performed. 1% Xylocaine was used for local anesthesia.  An incision was made below the right clavicle. A subcutaneous pocket was then formed. A needle is advanced into the right subclavian vein using the Seldinger technique without difficulty. A guidewire was then advanced into the right atrium under fluoroscopic guidance. An introducer peel-away sheath were placed over the guidewire. The catheter was then inserted through the peel-away sheath the peel-away sheath was removed. The catheter was then attached to the port and the port placed in its subcutaneous pocket. A power port was inserted. Fluoroscopy was used confirm adequate positioning. Good backflow of blood was noted in the port. The port was flushed with heparin flush. The subcutaneous layer was reapproximated using 3-0 Vicryl interrupted suture. The skin was closed using a 4-0 Vicryl subcuticular suture. Liquiband was then applied.  All tape and needle counts were correct at the end of the procedure. Patient was transferred to PACU in stable condition. A chest x-ray will be performed at that time.  Complications:  None  EBL:  Minimal  Specimen:  None

## 2014-12-01 ENCOUNTER — Telehealth: Payer: Self-pay | Admitting: *Deleted

## 2014-12-01 ENCOUNTER — Telehealth: Payer: Self-pay | Admitting: Oncology

## 2014-12-01 ENCOUNTER — Encounter (HOSPITAL_COMMUNITY): Payer: Self-pay | Admitting: General Surgery

## 2014-12-01 NOTE — Telephone Encounter (Signed)
Called to confirm her PAC is in and she is feeling ready for her treatment tomorrow. She feels a little tired today, but ready. Will pick up her EMLA cream this afternoon and knows how to use it. Still very anxious about getting "another infection". Asking if Dr. Benay Spice will give her Neulasta? Informed her this is usually added later in treatment regimen if it is found to be needed. She  "don't want to take any chances and I want the Neulasta". Will make MD aware of her request.

## 2014-12-01 NOTE — Telephone Encounter (Signed)
I have moved appt from 4/4 to 4/6

## 2014-12-01 NOTE — Telephone Encounter (Signed)
Labs/ov/chemo r/s per 03/21 POF, sent msg to move chemo and AM added MD visit due to override, sent pt msg on my chart confirming updated schedule.... KJ

## 2014-12-02 ENCOUNTER — Telehealth: Payer: Self-pay | Admitting: *Deleted

## 2014-12-02 ENCOUNTER — Other Ambulatory Visit: Payer: Self-pay | Admitting: Oncology

## 2014-12-02 ENCOUNTER — Ambulatory Visit (HOSPITAL_BASED_OUTPATIENT_CLINIC_OR_DEPARTMENT_OTHER): Payer: 59

## 2014-12-02 ENCOUNTER — Other Ambulatory Visit (HOSPITAL_BASED_OUTPATIENT_CLINIC_OR_DEPARTMENT_OTHER): Payer: 59

## 2014-12-02 DIAGNOSIS — Z79899 Other long term (current) drug therapy: Secondary | ICD-10-CM

## 2014-12-02 DIAGNOSIS — C182 Malignant neoplasm of ascending colon: Secondary | ICD-10-CM

## 2014-12-02 DIAGNOSIS — Z5111 Encounter for antineoplastic chemotherapy: Secondary | ICD-10-CM | POA: Diagnosis not present

## 2014-12-02 DIAGNOSIS — C7800 Secondary malignant neoplasm of unspecified lung: Secondary | ICD-10-CM

## 2014-12-02 DIAGNOSIS — C180 Malignant neoplasm of cecum: Secondary | ICD-10-CM

## 2014-12-02 LAB — CBC WITH DIFFERENTIAL/PLATELET
BASO%: 0.9 % (ref 0.0–2.0)
BASOS ABS: 0.1 10*3/uL (ref 0.0–0.1)
EOS ABS: 0.2 10*3/uL (ref 0.0–0.5)
EOS%: 3.6 % (ref 0.0–7.0)
HCT: 34.2 % — ABNORMAL LOW (ref 34.8–46.6)
HGB: 11.1 g/dL — ABNORMAL LOW (ref 11.6–15.9)
LYMPH#: 1.3 10*3/uL (ref 0.9–3.3)
LYMPH%: 20.2 % (ref 14.0–49.7)
MCH: 27.2 pg (ref 25.1–34.0)
MCHC: 32.3 g/dL (ref 31.5–36.0)
MCV: 84.4 fL (ref 79.5–101.0)
MONO#: 0.5 10*3/uL (ref 0.1–0.9)
MONO%: 8.6 % (ref 0.0–14.0)
NEUT#: 4.2 10*3/uL (ref 1.5–6.5)
NEUT%: 66.7 % (ref 38.4–76.8)
Platelets: 311 10*3/uL (ref 145–400)
RBC: 4.06 10*6/uL (ref 3.70–5.45)
RDW: 18 % — AB (ref 11.2–14.5)
WBC: 6.4 10*3/uL (ref 3.9–10.3)

## 2014-12-02 LAB — COMPREHENSIVE METABOLIC PANEL (CC13)
ALBUMIN: 3.5 g/dL (ref 3.5–5.0)
ALK PHOS: 82 U/L (ref 40–150)
ALT: 24 U/L (ref 0–55)
AST: 23 U/L (ref 5–34)
Anion Gap: 10 mEq/L (ref 3–11)
BUN: 9.4 mg/dL (ref 7.0–26.0)
CO2: 25 mEq/L (ref 22–29)
Calcium: 9.4 mg/dL (ref 8.4–10.4)
Chloride: 105 mEq/L (ref 98–109)
Creatinine: 0.8 mg/dL (ref 0.6–1.1)
EGFR: 83 mL/min/{1.73_m2} — ABNORMAL LOW (ref >90)
Glucose: 102 mg/dl (ref 70–140)
POTASSIUM: 4 meq/L (ref 3.5–5.1)
SODIUM: 140 meq/L (ref 136–145)
TOTAL PROTEIN: 6.6 g/dL (ref 6.4–8.3)
Total Bilirubin: 0.63 mg/dL (ref 0.20–1.20)

## 2014-12-02 LAB — UA PROTEIN, DIPSTICK - CHCC: PROTEIN: NEGATIVE mg/dL

## 2014-12-02 MED ORDER — SODIUM CHLORIDE 0.9 % IV SOLN
Freq: Once | INTRAVENOUS | Status: AC
  Administered 2014-12-02: 14:00:00 via INTRAVENOUS
  Filled 2014-12-02: qty 5

## 2014-12-02 MED ORDER — OXALIPLATIN CHEMO INJECTION 100 MG/20ML
85.0000 mg/m2 | Freq: Once | INTRAVENOUS | Status: AC
  Administered 2014-12-02: 145 mg via INTRAVENOUS
  Filled 2014-12-02: qty 29

## 2014-12-02 MED ORDER — LEUCOVORIN CALCIUM INJECTION 350 MG
400.0000 mg/m2 | Freq: Once | INTRAMUSCULAR | Status: AC
  Administered 2014-12-02: 688 mg via INTRAVENOUS
  Filled 2014-12-02: qty 34.4

## 2014-12-02 MED ORDER — PALONOSETRON HCL INJECTION 0.25 MG/5ML
INTRAVENOUS | Status: AC
  Filled 2014-12-02: qty 5

## 2014-12-02 MED ORDER — PALONOSETRON HCL INJECTION 0.25 MG/5ML
0.2500 mg | Freq: Once | INTRAVENOUS | Status: AC
  Administered 2014-12-02: 0.25 mg via INTRAVENOUS

## 2014-12-02 MED ORDER — DEXTROSE 5 % IV SOLN
Freq: Once | INTRAVENOUS | Status: AC
  Administered 2014-12-02: 14:00:00 via INTRAVENOUS

## 2014-12-02 MED ORDER — SODIUM CHLORIDE 0.9 % IV SOLN
1800.0000 mg/m2 | INTRAVENOUS | Status: DC
  Administered 2014-12-02: 3100 mg via INTRAVENOUS
  Filled 2014-12-02: qty 62

## 2014-12-02 NOTE — Telephone Encounter (Signed)
Called back and gave her estimate of 2:30 pm on Friday for pump d/c. It will be finalized while she is here today for 46 hours after the pump is started. She is asking if it can be stopped earlier in the day? Instructed her to relay all these concerns to the treatment nurse and she will talk with MD. Want to ensure she gets all her drug, but not speed up rate to prevent adverse effect. She is also asking to make her chemo appointment next time for later in the day. Suggested she discuss this with chemo scheduler today.

## 2014-12-02 NOTE — Patient Instructions (Signed)
[place] Discharge Instructions for Patients Receiving Chemotherapy  Today you received the following chemotherapy agents oxaliplatin/leucovorin/fluorouracil.   To help prevent nausea and vomiting after your treatment, we encourage you to take your nausea medication as directed   If you develop nausea and vomiting that is not controlled by your nausea medication, call the clinic.   BELOW ARE SYMPTOMS THAT SHOULD BE REPORTED IMMEDIATELY:  *FEVER GREATER THAN 100.5 F  *CHILLS WITH OR WITHOUT FEVER  NAUSEA AND VOMITING THAT IS NOT CONTROLLED WITH YOUR NAUSEA MEDICATION  *UNUSUAL SHORTNESS OF BREATH  *UNUSUAL BRUISING OR BLEEDING  TENDERNESS IN MOUTH AND THROAT WITH OR WITHOUT PRESENCE OF ULCERS  *URINARY PROBLEMS  *BOWEL PROBLEMS  UNUSUAL RASH Items with * indicate a potential emergency and should be followed up as soon as possible.  Feel free to call the clinic you have any questions or concerns. The clinic phone number is [phone].  Fluorouracil, 5-FU injection What is this medicine? FLUOROURACIL, 5-FU (flure oh YOOR a sil) is a chemotherapy drug. It slows the growth of cancer cells. This medicine is used to treat many types of cancer like breast cancer, colon or rectal cancer, pancreatic cancer, and stomach cancer. This medicine may be used for other purposes; ask your health care provider or pharmacist if you have questions. COMMON BRAND NAME(S): Adrucil What should I tell my health care provider before I take this medicine? They need to know if you have any of these conditions: -blood disorders -dihydropyrimidine dehydrogenase (DPD) deficiency -infection (especially a virus infection such as chickenpox, cold sores, or herpes) -kidney disease -liver disease -malnourished, poor nutrition -recent or ongoing radiation therapy -an unusual or allergic reaction to fluorouracil, other chemotherapy, other medicines, foods, dyes, or  preservatives -pregnant or trying to get pregnant -breast-feeding How should I use this medicine? This drug is given as an infusion or injection into a vein. It is administered in a hospital or clinic by a specially trained health care professional. Talk to your pediatrician regarding the use of this medicine in children. Special care may be needed. Overdosage: If you think you have taken too much of this medicine contact a poison control center or emergency room at once. NOTE: This medicine is only for you. Do not share this medicine with others. What if I miss a dose? It is important not to miss your dose. Call your doctor or health care professional if you are unable to keep an appointment. What may interact with this medicine? -allopurinol -cimetidine -dapsone -digoxin -hydroxyurea -leucovorin -levamisole -medicines for seizures like ethotoin, fosphenytoin, phenytoin -medicines to increase blood counts like filgrastim, pegfilgrastim, sargramostim -medicines that treat or prevent blood clots like warfarin, enoxaparin, and dalteparin -methotrexate -metronidazole -pyrimethamine -some other chemotherapy drugs like busulfan, cisplatin, estramustine, vinblastine -trimethoprim -trimetrexate -vaccines Talk to your doctor or health care professional before taking any of these medicines: -acetaminophen -aspirin -ibuprofen -ketoprofen -naproxen This list may not describe all possible interactions. Give your health care provider a list of all the medicines, herbs, non-prescription drugs, or dietary supplements you use. Also tell them if you smoke, drink alcohol, or use illegal drugs. Some items may interact with your medicine. What should I watch for while using this medicine? Visit your doctor for checks on your progress. This drug may make you feel generally unwell. This is not uncommon, as chemotherapy can affect healthy cells as well as cancer cells. Report any side effects. Continue  your course of treatment even though you feel ill unless  your doctor tells you to stop. In some cases, you may be given additional medicines to help with side effects. Follow all directions for their use. Call your doctor or health care professional for advice if you get a fever, chills or sore throat, or other symptoms of a cold or flu. Do not treat yourself. This drug decreases your body's ability to fight infections. Try to avoid being around people who are sick. This medicine may increase your risk to bruise or bleed. Call your doctor or health care professional if you notice any unusual bleeding. Be careful brushing and flossing your teeth or using a toothpick because you may get an infection or bleed more easily. If you have any dental work done, tell your dentist you are receiving this medicine. Avoid taking products that contain aspirin, acetaminophen, ibuprofen, naproxen, or ketoprofen unless instructed by your doctor. These medicines may hide a fever. Do not become pregnant while taking this medicine. Women should inform their doctor if they wish to become pregnant or think they might be pregnant. There is a potential for serious side effects to an unborn child. Talk to your health care professional or pharmacist for more information. Do not breast-feed an infant while taking this medicine. Men should inform their doctor if they wish to father a child. This medicine may lower sperm counts. Do not treat diarrhea with over the counter products. Contact your doctor if you have diarrhea that lasts more than 2 days or if it is severe and watery. This medicine can make you more sensitive to the sun. Keep out of the sun. If you cannot avoid being in the sun, wear protective clothing and use sunscreen. Do not use sun lamps or tanning beds/booths. What side effects may I notice from receiving this medicine? Side effects that you should report to your doctor or health care professional as soon as  possible: -allergic reactions like skin rash, itching or hives, swelling of the face, lips, or tongue -low blood counts - this medicine may decrease the number of white blood cells, red blood cells and platelets. You may be at increased risk for infections and bleeding. -signs of infection - fever or chills, cough, sore throat, pain or difficulty passing urine -signs of decreased platelets or bleeding - bruising, pinpoint red spots on the skin, black, tarry stools, blood in the urine -signs of decreased red blood cells - unusually weak or tired, fainting spells, lightheadedness -breathing problems -changes in vision -chest pain -mouth sores -nausea and vomiting -pain, swelling, redness at site where injected -pain, tingling, numbness in the hands or feet -redness, swelling, or sores on hands or feet -stomach pain -unusual bleeding Side effects that usually do not require medical attention (report to your doctor or health care professional if they continue or are bothersome): -changes in finger or toe nails -diarrhea -dry or itchy skin -hair loss -headache -loss of appetite -sensitivity of eyes to the light -stomach upset -unusually teary eyes This list may not describe all possible side effects. Call your doctor for medical advice about side effects. You may report side effects to FDA at 1-800-FDA-1088. Where should I keep my medicine? This drug is given in a hospital or clinic and will not be stored at home. NOTE: This sheet is a summary. It may not cover all possible information. If you have questions about this medicine, talk to your doctor, pharmacist, or health care provider.  2015, Elsevier/Gold Standard. (2008-01-01 13:53:16)  Leucovorin injection What is this medicine? LEUCOVORIN (  loo koe VOR in) is used to prevent or treat the harmful effects of some medicines. This medicine is used to treat anemia caused by a low amount of folic acid in the body. It is also used with  5-fluorouracil (5-FU) to treat colon cancer. This medicine may be used for other purposes; ask your health care provider or pharmacist if you have questions. What should I tell my health care provider before I take this medicine? They need to know if you have any of these conditions: -anemia from low levels of vitamin B-12 in the blood -an unusual or allergic reaction to leucovorin, folic acid, other medicines, foods, dyes, or preservatives -pregnant or trying to get pregnant -breast-feeding How should I use this medicine? This medicine is for injection into a muscle or into a vein. It is given by a health care professional in a hospital or clinic setting. Talk to your pediatrician regarding the use of this medicine in children. Special care may be needed. Overdosage: If you think you have taken too much of this medicine contact a poison control center or emergency room at once. NOTE: This medicine is only for you. Do not share this medicine with others. What if I miss a dose? This does not apply. What may interact with this medicine? -capecitabine -fluorouracil -phenobarbital -phenytoin -primidone -trimethoprim-sulfamethoxazole This list may not describe all possible interactions. Give your health care provider a list of all the medicines, herbs, non-prescription drugs, or dietary supplements you use. Also tell them if you smoke, drink alcohol, or use illegal drugs. Some items may interact with your medicine. What should I watch for while using this medicine? Your condition will be monitored carefully while you are receiving this medicine. This medicine may increase the side effects of 5-fluorouracil, 5-FU. Tell your doctor or health care professional if you have diarrhea or mouth sores that do not get better or that get worse. What side effects may I notice from receiving this medicine? Side effects that you should report to your doctor or health care professional as soon as  possible: -allergic reactions like skin rash, itching or hives, swelling of the face, lips, or tongue -breathing problems -fever, infection -mouth sores -unusual bleeding or bruising -unusually weak or tired Side effects that usually do not require medical attention (report to your doctor or health care professional if they continue or are bothersome): -constipation or diarrhea -loss of appetite -nausea, vomiting This list may not describe all possible side effects. Call your doctor for medical advice about side effects. You may report side effects to FDA at 1-800-FDA-1088. Where should I keep my medicine? This drug is given in a hospital or clinic and will not be stored at home. NOTE: This sheet is a summary. It may not cover all possible information. If you have questions about this medicine, talk to your doctor, pharmacist, or health care provider.  2015, Elsevier/Gold Standard. (2008-03-03 16:50:29)

## 2014-12-02 NOTE — Telephone Encounter (Signed)
Left VM for RN to call back regarding time for pump d/c on Friday.

## 2014-12-03 ENCOUNTER — Telehealth: Payer: Self-pay | Admitting: *Deleted

## 2014-12-03 ENCOUNTER — Encounter: Payer: Self-pay | Admitting: *Deleted

## 2014-12-03 NOTE — Telephone Encounter (Signed)
-----   Message from Arty Baumgartner, RN sent at 12/02/2014  2:00 PM EDT ----- Regarding: First time chemo.  GS First time leucovorin/50fu.  Has had oxali/avastin.  Dr Benay Spice [phone]

## 2014-12-03 NOTE — Telephone Encounter (Signed)
Spoke with pt, she denies nausea or diarrhea. Pt is anxious about getting diarrhea from the FOLFOX treatment. Support given. Pt understands to notify nursing if she has any diarrhea. Pt has Imodium/ Lomotil at home. Instructions reviewed.  Pt is requesting to move her appointments to a later time on 4/6. No availability with providers. Will call her if the schedule changes. She voiced understanding. Requests to schedule her future appointments later in the day.

## 2014-12-03 NOTE — Progress Notes (Signed)
[place] Work  Clinical Social Work was referred by patient for assessment of psychosocial needs.  Clinical Social Worker met with patient for lengthy discussion at [facility] to offer support and assess for needs.  Pt continues to struggle with finances, but is hesitant to apply for the grant funds or for medicaid. CSW has spoken to her about this process on several different occasions and pt is not willing to proceed at this point. Pt would qualify for both assistance options. Pt has applied for ss disability, but was told there is a waiting period by them. She has contacted [employer] and will be getting application this week that she would like to complete.  Pt appears to have begun processing the severity of her illness as she has made some comments about "getting my will done" and wanted to get her ADRs in place as well. CSW will meet with pt again on Friday during her pump disconnect to check in and to reassess needs.   Clinical Social Work interventions: Supportive listening Resource education  Loren Racer, [place] Worker [place]  [place] Phone: [phone] Fax: [phone]

## 2014-12-04 ENCOUNTER — Ambulatory Visit (HOSPITAL_BASED_OUTPATIENT_CLINIC_OR_DEPARTMENT_OTHER): Payer: 59

## 2014-12-04 DIAGNOSIS — C180 Malignant neoplasm of cecum: Secondary | ICD-10-CM

## 2014-12-04 DIAGNOSIS — C182 Malignant neoplasm of ascending colon: Secondary | ICD-10-CM | POA: Diagnosis not present

## 2014-12-04 MED ORDER — SODIUM CHLORIDE 0.9 % IJ SOLN
10.0000 mL | INTRAMUSCULAR | Status: DC | PRN
  Administered 2014-12-04: 10 mL
  Filled 2014-12-04: qty 10

## 2014-12-04 MED ORDER — HEPARIN SOD (PORK) LOCK FLUSH 100 UNIT/ML IV SOLN
500.0000 [IU] | Freq: Once | INTRAVENOUS | Status: AC | PRN
  Administered 2014-12-04: 500 [IU]
  Filled 2014-12-04: qty 5

## 2014-12-04 NOTE — Patient Instructions (Signed)
Fluorouracil, 5FU; Diclofenac topical cream What is this medicine? FLUOROURACIL; DICLOFENAC (flure oh YOOR a sil; dye KLOE fen ak) is a combination of a topical chemotherapy agent and non-steroidal anti-inflammatory drug (NSAID). It is used on the skin to treat skin cancer and skin conditions that could become cancer. This medicine may be used for other purposes; ask your health care provider or pharmacist if you have questions. COMMON BRAND NAME(S): FLUORAC What should I tell my health care provider before I take this medicine? They need to know if you have any of these conditions: -bleeding problems -cigarette smoker -DPD enzyme deficiency -heart disease -high blood pressure -if you frequently drink alcohol containing drinks -kidney disease -liver disease -open or infected skin -stomach problems -swelling or open sores at the treatment site -recent or planned coronary artery bypass graft (CABG) surgery -an unusual or allergic reaction to fluorouracil, diclofenac, aspirin, other NSAIDs, other medicines, foods, dyes, or preservatives -pregnant or trying to get pregnant -breast-feeding How should I use this medicine? This medicine is only for use on the skin. Follow the directions on the prescription label. Wash hands before and after use. Wash affected area and gently pat dry. To apply this medicine use a cotton-tipped applicator, or use gloves if applying with fingertips. If applied with unprotected fingertips, it is very important to wash your hands well after you apply this medicine. Avoid applying to the eyes, nose, or mouth. Apply enough medicine to cover the affected area. You can cover the area with a light gauze dressing, but do not use tight or air-tight dressings. Finish the full course prescribed by your doctor or health care professional, even if you think your condition is better. Do not stop taking except on the advice of your doctor or health care professional. Talk to your  pediatrician regarding the use of this medicine in children. Special care may be needed. Overdosage: If you think you've taken too much of this medicine contact a poison control center or emergency room at once. Overdosage: If you think you have taken too much of this medicine contact a poison control center or emergency room at once. NOTE: This medicine is only for you. Do not share this medicine with others. What if I miss a dose? If you miss a dose, apply it as soon as you can. If it is almost time for your next dose, only use that dose. Do not apply extra doses. Contact your doctor or health care professional if you miss more than one dose. What may interact with this medicine? Interactions are not expected. Do not use any other skin products without telling your doctor or health care professional. This list may not describe all possible interactions. Give your health care provider a list of all the medicines, herbs, non-prescription drugs, or dietary supplements you use. Also tell them if you smoke, drink alcohol, or use illegal drugs. Some items may interact with your medicine. What should I watch for while using this medicine? Visit your doctor or health care professional for checks on your progress. You will need to use this medicine for 2 to 6 weeks. This may be longer depending on the condition being treated. You may not see full healing for another 1 to 2 months after you stop using the medicine. Treated areas of skin can look unsightly during and for several weeks after treatment with this medicine. This medicine can make you more sensitive to the sun. Keep out of the sun. If you cannot avoid being in   the sun, wear protective clothing and use sunscreen. Do not use sun lamps or tanning beds/booths. Where should I keep my What side effects may I notice from receiving this medicine? Side effects that you should report to your doctor or health care professional as soon as possible: -allergic  reactions like skin rash, itching or hives, swelling of the face, lips, or tongue -black or bloody stools, blood in the urine or vomit -blurred vision -chest pain -difficulty breathing or wheezing -redness, blistering, peeling or loosening of the skin, including inside the mouth -severe redness and swelling of normal skin -slurred speech or weakness on one side of the body -trouble passing urine or change in the amount of urine -unexplained weight gain or swelling -unusually weak or tired -yellowing of eyes or skin Side effects that usually do not require medical attention (Report these to your doctor or health care professional if they continue or are bothersome.): -increased sensitivity of the skin to sun and ultraviolet light -pain and burning of the affected area -scaling or swelling of the affected area -skin rash, itching of the affected area -tenderness This list may not describe all possible side effects. Call your doctor for medical advice about side effects. You may report side effects to FDA at 1-800-FDA-1088. Where should I keep my medicine? Keep out of the reach of children. Store at room temperature between 20 and 25 degrees C (68 and 77 degrees F). Throw away any unused medicine after the expiration date. NOTE: This sheet is a summary. It may not cover all possible information. If you have questions about this medicine, talk to your doctor, pharmacist, or health care provider.  2015, Elsevier/Gold Standard. (2013-12-29 11:09:58)  

## 2014-12-07 ENCOUNTER — Telehealth: Payer: Self-pay | Admitting: *Deleted

## 2014-12-07 NOTE — Telephone Encounter (Signed)
Pt left a voice mail asking if it is OK to remove bandaid from port site and states she has questions about other symptoms she is having.  Called patient back, got voice mail. Let her know it is OK to remove bandaid and to call us back regarding other symptoms.

## 2014-12-09 ENCOUNTER — Telehealth: Payer: Self-pay | Admitting: *Deleted

## 2014-12-09 NOTE — Telephone Encounter (Signed)
Called Martha Becker to check on her status since her FOLFOX chemo on 12/02/14. She is happy to report she has had minimal nausea, eating, no diarrhea. Had low energy few days, but getting better. She is very happy and feels she can continue treatment now.

## 2014-12-13 ENCOUNTER — Other Ambulatory Visit: Payer: Self-pay | Admitting: Oncology

## 2014-12-14 ENCOUNTER — Ambulatory Visit: Payer: 59

## 2014-12-14 ENCOUNTER — Ambulatory Visit: Payer: 59 | Admitting: Nurse Practitioner

## 2014-12-14 ENCOUNTER — Other Ambulatory Visit: Payer: 59

## 2014-12-16 ENCOUNTER — Telehealth: Payer: Self-pay | Admitting: Oncology

## 2014-12-16 ENCOUNTER — Other Ambulatory Visit (HOSPITAL_BASED_OUTPATIENT_CLINIC_OR_DEPARTMENT_OTHER): Payer: 59

## 2014-12-16 ENCOUNTER — Encounter: Payer: Self-pay | Admitting: *Deleted

## 2014-12-16 ENCOUNTER — Ambulatory Visit (HOSPITAL_BASED_OUTPATIENT_CLINIC_OR_DEPARTMENT_OTHER): Payer: 59 | Admitting: Oncology

## 2014-12-16 ENCOUNTER — Ambulatory Visit (HOSPITAL_BASED_OUTPATIENT_CLINIC_OR_DEPARTMENT_OTHER): Payer: 59

## 2014-12-16 ENCOUNTER — Telehealth: Payer: Self-pay | Admitting: *Deleted

## 2014-12-16 VITALS — BP 129/75 | HR 75 | Temp 97.9°F | Resp 18 | Ht 66.0 in | Wt 145.3 lb

## 2014-12-16 DIAGNOSIS — C182 Malignant neoplasm of ascending colon: Secondary | ICD-10-CM | POA: Diagnosis not present

## 2014-12-16 DIAGNOSIS — C180 Malignant neoplasm of cecum: Secondary | ICD-10-CM

## 2014-12-16 DIAGNOSIS — C7800 Secondary malignant neoplasm of unspecified lung: Secondary | ICD-10-CM

## 2014-12-16 DIAGNOSIS — Z5112 Encounter for antineoplastic immunotherapy: Secondary | ICD-10-CM | POA: Diagnosis not present

## 2014-12-16 DIAGNOSIS — C772 Secondary and unspecified malignant neoplasm of intra-abdominal lymph nodes: Secondary | ICD-10-CM | POA: Diagnosis not present

## 2014-12-16 DIAGNOSIS — C787 Secondary malignant neoplasm of liver and intrahepatic bile duct: Secondary | ICD-10-CM

## 2014-12-16 LAB — CBC WITH DIFFERENTIAL/PLATELET
BASO%: 0.8 % (ref 0.0–2.0)
BASOS ABS: 0 10*3/uL (ref 0.0–0.1)
EOS%: 3.7 % (ref 0.0–7.0)
Eosinophils Absolute: 0.2 10*3/uL (ref 0.0–0.5)
HCT: 34.4 % — ABNORMAL LOW (ref 34.8–46.6)
HGB: 11.2 g/dL — ABNORMAL LOW (ref 11.6–15.9)
LYMPH#: 1.3 10*3/uL (ref 0.9–3.3)
LYMPH%: 27 % (ref 14.0–49.7)
MCH: 27.8 pg (ref 25.1–34.0)
MCHC: 32.5 g/dL (ref 31.5–36.0)
MCV: 85.8 fL (ref 79.5–101.0)
MONO#: 0.6 10*3/uL (ref 0.1–0.9)
MONO%: 13.3 % (ref 0.0–14.0)
NEUT#: 2.6 10*3/uL (ref 1.5–6.5)
NEUT%: 55.2 % (ref 38.4–76.8)
Platelets: 227 10*3/uL (ref 145–400)
RBC: 4.02 10*6/uL (ref 3.70–5.45)
RDW: 18.7 % — AB (ref 11.2–14.5)
WBC: 4.8 10*3/uL (ref 3.9–10.3)

## 2014-12-16 LAB — COMPREHENSIVE METABOLIC PANEL (CC13)
ALBUMIN: 3.7 g/dL (ref 3.5–5.0)
ALK PHOS: 83 U/L (ref 40–150)
ALT: 20 U/L (ref 0–55)
AST: 21 U/L (ref 5–34)
Anion Gap: 10 mEq/L (ref 3–11)
BUN: 13 mg/dL (ref 7.0–26.0)
CALCIUM: 9.2 mg/dL (ref 8.4–10.4)
CHLORIDE: 106 meq/L (ref 98–109)
CO2: 23 mEq/L (ref 22–29)
Creatinine: 0.8 mg/dL (ref 0.6–1.1)
EGFR: 78 mL/min/{1.73_m2} — AB (ref >90)
Glucose: 78 mg/dl (ref 70–140)
POTASSIUM: 4.4 meq/L (ref 3.5–5.1)
Sodium: 139 mEq/L (ref 136–145)
Total Bilirubin: 0.28 mg/dL (ref 0.20–1.20)
Total Protein: 6.5 g/dL (ref 6.4–8.3)

## 2014-12-16 MED ORDER — SODIUM CHLORIDE 0.9 % IV SOLN
5.0000 mg/kg | Freq: Once | INTRAVENOUS | Status: AC
  Administered 2014-12-16: 325 mg via INTRAVENOUS
  Filled 2014-12-16: qty 13

## 2014-12-16 MED ORDER — SODIUM CHLORIDE 0.9 % IV SOLN
Freq: Once | INTRAVENOUS | Status: AC
  Administered 2014-12-16: 10:00:00 via INTRAVENOUS

## 2014-12-16 MED ORDER — DEXTROSE 5 % IV SOLN
400.0000 mg/m2 | Freq: Once | INTRAVENOUS | Status: AC
  Administered 2014-12-16: 688 mg via INTRAVENOUS
  Filled 2014-12-16: qty 34.4

## 2014-12-16 MED ORDER — DEXTROSE 5 % IV SOLN
Freq: Once | INTRAVENOUS | Status: AC
  Administered 2014-12-16: 11:00:00 via INTRAVENOUS

## 2014-12-16 MED ORDER — PALONOSETRON HCL INJECTION 0.25 MG/5ML
INTRAVENOUS | Status: AC
  Filled 2014-12-16: qty 5

## 2014-12-16 MED ORDER — OXALIPLATIN CHEMO INJECTION 100 MG/20ML
85.0000 mg/m2 | Freq: Once | INTRAVENOUS | Status: AC
  Administered 2014-12-16: 145 mg via INTRAVENOUS
  Filled 2014-12-16: qty 29

## 2014-12-16 MED ORDER — PALONOSETRON HCL INJECTION 0.25 MG/5ML
0.2500 mg | Freq: Once | INTRAVENOUS | Status: AC
  Administered 2014-12-16: 0.25 mg via INTRAVENOUS

## 2014-12-16 MED ORDER — SODIUM CHLORIDE 0.9 % IV SOLN
1800.0000 mg/m2 | INTRAVENOUS | Status: DC
  Administered 2014-12-16: 3100 mg via INTRAVENOUS
  Filled 2014-12-16: qty 62

## 2014-12-16 MED ORDER — SODIUM CHLORIDE 0.9 % IV SOLN
Freq: Once | INTRAVENOUS | Status: AC
  Administered 2014-12-16: 10:00:00 via INTRAVENOUS
  Filled 2014-12-16: qty 5

## 2014-12-16 NOTE — Telephone Encounter (Signed)
Per pt she does labs from her veins in her arms, cancelled flush and confirmed the schedule, changed to later in the morning due to from she lives and sent msg to my chart to confirm everything is ok... KJ

## 2014-12-16 NOTE — [facility] Oncology Navigator Note (Signed)
Met with patient during office visit to provide support and assess for needs to promote continuity of care. Denies any needs at this time. She looks/acts more relaxed and reports no diarrhea, nausea and was able to mow the lawn yesterday.   Merceda Elks, RN, BSN GI Oncology [place]

## 2014-12-16 NOTE — Progress Notes (Signed)
[place] Work  Clinical Social Work met with pt and received application to submit from [employer]. Clinical Social Worker submitted application and will continue to follow for assistance. No new needs identified currently.    Clinical Social Work interventions: Resource assistance  Loren Racer, [place] Worker [place]  [place] Phone: [phone] Fax: [phone]

## 2014-12-16 NOTE — Progress Notes (Signed)
  [facility] OFFICE PROGRESS NOTE   Diagnosis: Colon cancer  INTERVAL HISTORY:   Martha Becker returns as scheduled. She completed a cycle of FOLFOX 12/02/2014. She reports tolerating the chemotherapy well. No nausea/vomiting or diarrhea following chemotherapy. She had one "sore "at the right buccal mucosa. Cold sensitivity was less intense but lasted longer. No neuropathy symptoms at present.  She reports a good appetite and energy level.  Objective:  Vital signs in last 24 hours:  Blood pressure 129/75, pulse 75, temperature 97.9 F (36.6 C), temperature source Oral, resp. rate 18, height $RemoveBe'5\' 6"'wtzGzhIHt$  (1.676 m), weight 145 lb 4.8 oz (65.908 kg).    HEENT: No thrush or ulcers Resp: Clear bilaterally with a slight decrease in breath sounds at the right chest, no respiratory distress Cardio: Regular rate and rhythm GI: No hepatomegaly, nontender Vascular: No leg edema  Skin: Palms without erythema   Portacath/PICC-without erythema  Lab Results:  Lab Results  Component Value Date   WBC 4.8 12/16/2014   HGB 11.2* 12/16/2014   HCT 34.4* 12/16/2014   MCV 85.8 12/16/2014   PLT 227 12/16/2014   NEUTROABS 2.6 12/16/2014     Lab Results  Component Value Date   CEA 9.7* 10/22/2014    Imaging:  No results found.  Medications: I have reviewed the patient's current medications.  Assessment/Plan: 1. 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/31/2014  no loss of mismatch repair protein expression, microsatellite stable  K-ras wild-type by standard testing  Staging CT scans December 2015 and PET scan 09/22/2014 consistent with metastatic bilateral lung nodules, metastatic retroperitoneal lymphadenopathy, and a probable left liver metastasis  Cycle 1 CAPOX 10/29/2014 (Xeloda discontinued day 9 secondary to diarrhea)   CT 11/14/2014 revealed progressive metastatic disease at the lung  bases and liver compared to a PET scan from 09/22/2014, mild improvement in retroperitoneal lymphadenopathy  Cycle 1 FOLFOX 12/02/2014  Cycle 2 FOLFOX, Avastin added 12/16/2014 2. Elevated TSH, PET scan 09/22/2014 with diffuse increased metabolic uptake in the thyroid   3. Family history of multiple cancers including colon cancer in her paternal grandfather and rectal cancer in a paternal first cousin  70. 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 The plan is to proceed with cycle 2 FOLFOX today. Avastin will be added with this cycle. We reviewed the potential toxicities associated with Avastin and she agrees to proceed.  She will return for an office visit and chemotherapy in 2 weeks. We will check the CEA when she returns in 2 weeks. The plan is to complete 5-6 cycles of chemotherapy prior to a restaging CT.  Betsy Coder, MD  12/16/2014  8:40 AM

## 2014-12-16 NOTE — Patient Instructions (Signed)
[facility] Discharge Instructions for Patients Receiving Chemotherapy  Today you received the following chemotherapy agents FOLFOX/Avastin.  To help prevent nausea and vomiting after your treatment, we encourage you to take your nausea medication as directed.    If you develop nausea and vomiting that is not controlled by your nausea medication, call the clinic.   BELOW ARE SYMPTOMS THAT SHOULD BE REPORTED IMMEDIATELY:  *FEVER GREATER THAN 100.5 F  *CHILLS WITH OR WITHOUT FEVER  NAUSEA AND VOMITING THAT IS NOT CONTROLLED WITH YOUR NAUSEA MEDICATION  *UNUSUAL SHORTNESS OF BREATH  *UNUSUAL BRUISING OR BLEEDING  TENDERNESS IN MOUTH AND THROAT WITH OR WITHOUT PRESENCE OF ULCERS  *URINARY PROBLEMS  *BOWEL PROBLEMS  UNUSUAL RASH Items with * indicate a potential emergency and should be followed up as soon as possible.  Feel free to call the clinic you have any questions or concerns. The clinic phone number is [phone].  Please show the CHEMO ALERT CARD at check-in to the Emergency Department and triage nurse.   

## 2014-12-16 NOTE — Telephone Encounter (Signed)
Gave pt calendar added labs/flush/ov and sent msg to chemo to add Folfox/Avastin...Marland KitchenMarland KitchenMarland Kitchen KJ

## 2014-12-16 NOTE — Telephone Encounter (Signed)
Per staff message and POF I have scheduled appts. Advised scheduler of appts. JMW  

## 2014-12-18 ENCOUNTER — Ambulatory Visit (HOSPITAL_BASED_OUTPATIENT_CLINIC_OR_DEPARTMENT_OTHER): Payer: 59

## 2014-12-18 DIAGNOSIS — C182 Malignant neoplasm of ascending colon: Secondary | ICD-10-CM

## 2014-12-18 DIAGNOSIS — C7801 Secondary malignant neoplasm of right lung: Secondary | ICD-10-CM | POA: Diagnosis not present

## 2014-12-18 DIAGNOSIS — C7802 Secondary malignant neoplasm of left lung: Secondary | ICD-10-CM

## 2014-12-18 DIAGNOSIS — C180 Malignant neoplasm of cecum: Secondary | ICD-10-CM

## 2014-12-18 MED ORDER — SODIUM CHLORIDE 0.9 % IJ SOLN
10.0000 mL | INTRAMUSCULAR | Status: DC | PRN
Start: 2014-12-18 — End: 2014-12-18
  Administered 2014-12-18: 10 mL
  Filled 2014-12-18: qty 10

## 2014-12-18 MED ORDER — HEPARIN SOD (PORK) LOCK FLUSH 100 UNIT/ML IV SOLN
500.0000 [IU] | Freq: Once | INTRAVENOUS | Status: AC | PRN
  Administered 2014-12-18: 500 [IU]
  Filled 2014-12-18: qty 5

## 2014-12-18 NOTE — Patient Instructions (Signed)
Fluorouracil, 5FU; Diclofenac topical cream What is this medicine? FLUOROURACIL; DICLOFENAC (flure oh YOOR a sil; dye KLOE fen ak) is a combination of a topical chemotherapy agent and non-steroidal anti-inflammatory drug (NSAID). It is used on the skin to treat skin cancer and skin conditions that could become cancer. This medicine may be used for other purposes; ask your health care provider or pharmacist if you have questions. COMMON BRAND NAME(S): FLUORAC What should I tell my health care provider before I take this medicine? They need to know if you have any of these conditions: -bleeding problems -cigarette smoker -DPD enzyme deficiency -heart disease -high blood pressure -if you frequently drink alcohol containing drinks -kidney disease -liver disease -open or infected skin -stomach problems -swelling or open sores at the treatment site -recent or planned coronary artery bypass graft (CABG) surgery -an unusual or allergic reaction to fluorouracil, diclofenac, aspirin, other NSAIDs, other medicines, foods, dyes, or preservatives -pregnant or trying to get pregnant -breast-feeding How should I use this medicine? This medicine is only for use on the skin. Follow the directions on the prescription label. Wash hands before and after use. Wash affected area and gently pat dry. To apply this medicine use a cotton-tipped applicator, or use gloves if applying with fingertips. If applied with unprotected fingertips, it is very important to wash your hands well after you apply this medicine. Avoid applying to the eyes, nose, or mouth. Apply enough medicine to cover the affected area. You can cover the area with a light gauze dressing, but do not use tight or air-tight dressings. Finish the full course prescribed by your doctor or health care professional, even if you think your condition is better. Do not stop taking except on the advice of your doctor or health care professional. Talk to your  pediatrician regarding the use of this medicine in children. Special care may be needed. Overdosage: If you think you've taken too much of this medicine contact a poison control center or emergency room at once. Overdosage: If you think you have taken too much of this medicine contact a poison control center or emergency room at once. NOTE: This medicine is only for you. Do not share this medicine with others. What if I miss a dose? If you miss a dose, apply it as soon as you can. If it is almost time for your next dose, only use that dose. Do not apply extra doses. Contact your doctor or health care professional if you miss more than one dose. What may interact with this medicine? Interactions are not expected. Do not use any other skin products without telling your doctor or health care professional. This list may not describe all possible interactions. Give your health care provider a list of all the medicines, herbs, non-prescription drugs, or dietary supplements you use. Also tell them if you smoke, drink alcohol, or use illegal drugs. Some items may interact with your medicine. What should I watch for while using this medicine? Visit your doctor or health care professional for checks on your progress. You will need to use this medicine for 2 to 6 weeks. This may be longer depending on the condition being treated. You may not see full healing for another 1 to 2 months after you stop using the medicine. Treated areas of skin can look unsightly during and for several weeks after treatment with this medicine. This medicine can make you more sensitive to the sun. Keep out of the sun. If you cannot avoid being in   the sun, wear protective clothing and use sunscreen. Do not use sun lamps or tanning beds/booths. Where should I keep my What side effects may I notice from receiving this medicine? Side effects that you should report to your doctor or health care professional as soon as possible: -allergic  reactions like skin rash, itching or hives, swelling of the face, lips, or tongue -black or bloody stools, blood in the urine or vomit -blurred vision -chest pain -difficulty breathing or wheezing -redness, blistering, peeling or loosening of the skin, including inside the mouth -severe redness and swelling of normal skin -slurred speech or weakness on one side of the body -trouble passing urine or change in the amount of urine -unexplained weight gain or swelling -unusually weak or tired -yellowing of eyes or skin Side effects that usually do not require medical attention (Report these to your doctor or health care professional if they continue or are bothersome.): -increased sensitivity of the skin to sun and ultraviolet light -pain and burning of the affected area -scaling or swelling of the affected area -skin rash, itching of the affected area -tenderness This list may not describe all possible side effects. Call your doctor for medical advice about side effects. You may report side effects to FDA at 1-800-FDA-1088. Where should I keep my medicine? Keep out of the reach of children. Store at room temperature between 20 and 25 degrees C (68 and 77 degrees F). Throw away any unused medicine after the expiration date. NOTE: This sheet is a summary. It may not cover all possible information. If you have questions about this medicine, talk to your doctor, pharmacist, or health care provider.  2015, Elsevier/Gold Standard. (2013-12-29 11:09:58)  

## 2014-12-24 ENCOUNTER — Encounter (HOSPITAL_COMMUNITY): Payer: Self-pay

## 2014-12-27 ENCOUNTER — Other Ambulatory Visit: Payer: Self-pay | Admitting: Oncology

## 2014-12-30 ENCOUNTER — Ambulatory Visit (HOSPITAL_BASED_OUTPATIENT_CLINIC_OR_DEPARTMENT_OTHER): Payer: 59

## 2014-12-30 ENCOUNTER — Other Ambulatory Visit (HOSPITAL_BASED_OUTPATIENT_CLINIC_OR_DEPARTMENT_OTHER): Payer: 59

## 2014-12-30 ENCOUNTER — Encounter: Payer: Self-pay | Admitting: *Deleted

## 2014-12-30 ENCOUNTER — Ambulatory Visit (HOSPITAL_BASED_OUTPATIENT_CLINIC_OR_DEPARTMENT_OTHER): Payer: 59 | Admitting: Nurse Practitioner

## 2014-12-30 ENCOUNTER — Telehealth: Payer: Self-pay | Admitting: Nurse Practitioner

## 2014-12-30 VITALS — BP 146/89 | HR 72 | Temp 97.8°F | Resp 18 | Ht 66.0 in | Wt 144.9 lb

## 2014-12-30 VITALS — BP 117/74 | HR 59

## 2014-12-30 DIAGNOSIS — Z5111 Encounter for antineoplastic chemotherapy: Secondary | ICD-10-CM

## 2014-12-30 DIAGNOSIS — Z5112 Encounter for antineoplastic immunotherapy: Secondary | ICD-10-CM

## 2014-12-30 DIAGNOSIS — C7801 Secondary malignant neoplasm of right lung: Secondary | ICD-10-CM

## 2014-12-30 DIAGNOSIS — C180 Malignant neoplasm of cecum: Secondary | ICD-10-CM

## 2014-12-30 DIAGNOSIS — C182 Malignant neoplasm of ascending colon: Secondary | ICD-10-CM

## 2014-12-30 DIAGNOSIS — C7800 Secondary malignant neoplasm of unspecified lung: Secondary | ICD-10-CM

## 2014-12-30 DIAGNOSIS — C786 Secondary malignant neoplasm of retroperitoneum and peritoneum: Secondary | ICD-10-CM

## 2014-12-30 DIAGNOSIS — C801 Malignant (primary) neoplasm, unspecified: Secondary | ICD-10-CM

## 2014-12-30 DIAGNOSIS — Z808 Family history of malignant neoplasm of other organs or systems: Secondary | ICD-10-CM

## 2014-12-30 DIAGNOSIS — I808 Phlebitis and thrombophlebitis of other sites: Secondary | ICD-10-CM

## 2014-12-30 DIAGNOSIS — C7802 Secondary malignant neoplasm of left lung: Secondary | ICD-10-CM | POA: Diagnosis not present

## 2014-12-30 LAB — COMPREHENSIVE METABOLIC PANEL (CC13)
ALT: 23 U/L (ref 0–55)
AST: 23 U/L (ref 5–34)
Albumin: 3.8 g/dL (ref 3.5–5.0)
Alkaline Phosphatase: 78 U/L (ref 40–150)
Anion Gap: 15 mEq/L — ABNORMAL HIGH (ref 3–11)
BUN: 9.4 mg/dL (ref 7.0–26.0)
CO2: 18 mEq/L — ABNORMAL LOW (ref 22–29)
CREATININE: 0.8 mg/dL (ref 0.6–1.1)
Calcium: 9.3 mg/dL (ref 8.4–10.4)
Chloride: 108 mEq/L (ref 98–109)
EGFR: 80 mL/min/{1.73_m2} — ABNORMAL LOW (ref >90)
Glucose: 94 mg/dl (ref 70–140)
Potassium: 3.8 mEq/L (ref 3.5–5.1)
Sodium: 141 mEq/L (ref 136–145)
Total Bilirubin: 0.27 mg/dL (ref 0.20–1.20)
Total Protein: 6.6 g/dL (ref 6.4–8.3)

## 2014-12-30 LAB — CBC WITH DIFFERENTIAL/PLATELET
BASO%: 0.6 % (ref 0.0–2.0)
Basophils Absolute: 0 10*3/uL (ref 0.0–0.1)
EOS%: 3.2 % (ref 0.0–7.0)
Eosinophils Absolute: 0.2 10*3/uL (ref 0.0–0.5)
HEMATOCRIT: 34.8 % (ref 34.8–46.6)
HGB: 11.6 g/dL (ref 11.6–15.9)
LYMPH#: 1.6 10*3/uL (ref 0.9–3.3)
LYMPH%: 31.6 % (ref 14.0–49.7)
MCH: 28.6 pg (ref 25.1–34.0)
MCHC: 33.3 g/dL (ref 31.5–36.0)
MCV: 85.9 fL (ref 79.5–101.0)
MONO#: 0.6 10*3/uL (ref 0.1–0.9)
MONO%: 11.2 % (ref 0.0–14.0)
NEUT#: 2.7 10*3/uL (ref 1.5–6.5)
NEUT%: 53.4 % (ref 38.4–76.8)
Platelets: 135 10*3/uL — ABNORMAL LOW (ref 145–400)
RBC: 4.05 10*6/uL (ref 3.70–5.45)
RDW: 17.7 % — AB (ref 11.2–14.5)
WBC: 5 10*3/uL (ref 3.9–10.3)

## 2014-12-30 LAB — UA PROTEIN, DIPSTICK - CHCC: Protein, ur: NEGATIVE mg/dL

## 2014-12-30 MED ORDER — DEXTROSE 5 % IV SOLN
Freq: Once | INTRAVENOUS | Status: AC
  Administered 2014-12-30: 14:00:00 via INTRAVENOUS

## 2014-12-30 MED ORDER — SODIUM CHLORIDE 0.9 % IV SOLN
Freq: Once | INTRAVENOUS | Status: AC
  Administered 2014-12-30: 13:00:00 via INTRAVENOUS

## 2014-12-30 MED ORDER — SODIUM CHLORIDE 0.9 % IV SOLN
1800.0000 mg/m2 | INTRAVENOUS | Status: DC
  Administered 2014-12-30: 3100 mg via INTRAVENOUS
  Filled 2014-12-30: qty 62

## 2014-12-30 MED ORDER — LEUCOVORIN CALCIUM INJECTION 350 MG
400.0000 mg/m2 | Freq: Once | INTRAMUSCULAR | Status: AC
  Administered 2014-12-30: 688 mg via INTRAVENOUS
  Filled 2014-12-30: qty 34.4

## 2014-12-30 MED ORDER — SODIUM CHLORIDE 0.9 % IV SOLN
5.0000 mg/kg | Freq: Once | INTRAVENOUS | Status: AC
  Administered 2014-12-30: 325 mg via INTRAVENOUS
  Filled 2014-12-30: qty 13

## 2014-12-30 MED ORDER — PALONOSETRON HCL INJECTION 0.25 MG/5ML
0.2500 mg | Freq: Once | INTRAVENOUS | Status: AC
  Administered 2014-12-30: 0.25 mg via INTRAVENOUS

## 2014-12-30 MED ORDER — DEXTROSE 5 % IV SOLN
85.0000 mg/m2 | Freq: Once | INTRAVENOUS | Status: AC
  Administered 2014-12-30: 145 mg via INTRAVENOUS
  Filled 2014-12-30: qty 29

## 2014-12-30 MED ORDER — PALONOSETRON HCL INJECTION 0.25 MG/5ML
INTRAVENOUS | Status: AC
  Filled 2014-12-30: qty 5

## 2014-12-30 MED ORDER — SODIUM CHLORIDE 0.9 % IV SOLN
Freq: Once | INTRAVENOUS | Status: AC
  Administered 2014-12-30: 13:00:00 via INTRAVENOUS
  Filled 2014-12-30: qty 5

## 2014-12-30 NOTE — Patient Instructions (Signed)
[facility] Discharge Instructions for Patients Receiving Chemotherapy  Today you received the following chemotherapy agents oxaliplatin/leucovorin/fluorouracil and avastin.  To help prevent nausea and vomiting after your treatment, we encourage you to take your nausea medication as directed   If you develop nausea and vomiting that is not controlled by your nausea medication, call the clinic.   BELOW ARE SYMPTOMS THAT SHOULD BE REPORTED IMMEDIATELY:  *FEVER GREATER THAN 100.5 F  *CHILLS WITH OR WITHOUT FEVER  NAUSEA AND VOMITING THAT IS NOT CONTROLLED WITH YOUR NAUSEA MEDICATION  *UNUSUAL SHORTNESS OF BREATH  *UNUSUAL BRUISING OR BLEEDING  TENDERNESS IN MOUTH AND THROAT WITH OR WITHOUT PRESENCE OF ULCERS  *URINARY PROBLEMS  *BOWEL PROBLEMS  UNUSUAL RASH Items with * indicate a potential emergency and should be followed up as soon as possible.  Feel free to call the clinic you have any questions or concerns. The clinic phone number is [phone].  Fluorouracil, 5-FU injection What is this medicine? FLUOROURACIL, 5-FU (flure oh YOOR a sil) is a chemotherapy drug. It slows the growth of cancer cells. This medicine is used to treat many types of cancer like breast cancer, colon or rectal cancer, pancreatic cancer, and stomach cancer. This medicine may be used for other purposes; ask your health care provider or pharmacist if you have questions. COMMON BRAND NAME(S): Adrucil What should I tell my health care provider before I take this medicine? They need to know if you have any of these conditions: -blood disorders -dihydropyrimidine dehydrogenase (DPD) deficiency -infection (especially a virus infection such as chickenpox, cold sores, or herpes) -kidney disease -liver disease -malnourished, poor nutrition -recent or ongoing radiation therapy -an unusual or allergic reaction to fluorouracil, other chemotherapy, other medicines, foods, dyes, or  preservatives -pregnant or trying to get pregnant -breast-feeding How should I use this medicine? This drug is given as an infusion or injection into a vein. It is administered in a hospital or clinic by a specially trained health care professional. Talk to your pediatrician regarding the use of this medicine in children. Special care may be needed. Overdosage: If you think you have taken too much of this medicine contact a poison control center or emergency room at once. NOTE: This medicine is only for you. Do not share this medicine with others. What if I miss a dose? It is important not to miss your dose. Call your doctor or health care professional if you are unable to keep an appointment. What may interact with this medicine? -allopurinol -cimetidine -dapsone -digoxin -hydroxyurea -leucovorin -levamisole -medicines for seizures like ethotoin, fosphenytoin, phenytoin -medicines to increase blood counts like filgrastim, pegfilgrastim, sargramostim -medicines that treat or prevent blood clots like warfarin, enoxaparin, and dalteparin -methotrexate -metronidazole -pyrimethamine -some other chemotherapy drugs like busulfan, cisplatin, estramustine, vinblastine -trimethoprim -trimetrexate -vaccines Talk to your doctor or health care professional before taking any of these medicines: -acetaminophen -aspirin -ibuprofen -ketoprofen -naproxen This list may not describe all possible interactions. Give your health care provider a list of all the medicines, herbs, non-prescription drugs, or dietary supplements you use. Also tell them if you smoke, drink alcohol, or use illegal drugs. Some items may interact with your medicine. What should I watch for while using this medicine? Visit your doctor for checks on your progress. This drug may make you feel generally unwell. This is not uncommon, as chemotherapy can affect healthy cells as well as cancer cells. Report any side effects. Continue  your course of treatment even though you feel ill  unless your doctor tells you to stop. In some cases, you may be given additional medicines to help with side effects. Follow all directions for their use. Call your doctor or health care professional for advice if you get a fever, chills or sore throat, or other symptoms of a cold or flu. Do not treat yourself. This drug decreases your body's ability to fight infections. Try to avoid being around people who are sick. This medicine may increase your risk to bruise or bleed. Call your doctor or health care professional if you notice any unusual bleeding. Be careful brushing and flossing your teeth or using a toothpick because you may get an infection or bleed more easily. If you have any dental work done, tell your dentist you are receiving this medicine. Avoid taking products that contain aspirin, acetaminophen, ibuprofen, naproxen, or ketoprofen unless instructed by your doctor. These medicines may hide a fever. Do not become pregnant while taking this medicine. Women should inform their doctor if they wish to become pregnant or think they might be pregnant. There is a potential for serious side effects to an unborn child. Talk to your health care professional or pharmacist for more information. Do not breast-feed an infant while taking this medicine. Men should inform their doctor if they wish to father a child. This medicine may lower sperm counts. Do not treat diarrhea with over the counter products. Contact your doctor if you have diarrhea that lasts more than 2 days or if it is severe and watery. This medicine can make you more sensitive to the sun. Keep out of the sun. If you cannot avoid being in the sun, wear protective clothing and use sunscreen. Do not use sun lamps or tanning beds/booths. What side effects may I notice from receiving this medicine? Side effects that you should report to your doctor or health care professional as soon as  possible: -allergic reactions like skin rash, itching or hives, swelling of the face, lips, or tongue -low blood counts - this medicine may decrease the number of white blood cells, red blood cells and platelets. You may be at increased risk for infections and bleeding. -signs of infection - fever or chills, cough, sore throat, pain or difficulty passing urine -signs of decreased platelets or bleeding - bruising, pinpoint red spots on the skin, black, tarry stools, blood in the urine -signs of decreased red blood cells - unusually weak or tired, fainting spells, lightheadedness -breathing problems -changes in vision -chest pain -mouth sores -nausea and vomiting -pain, swelling, redness at site where injected -pain, tingling, numbness in the hands or feet -redness, swelling, or sores on hands or feet -stomach pain -unusual bleeding Side effects that usually do not require medical attention (report to your doctor or health care professional if they continue or are bothersome): -changes in finger or toe nails -diarrhea -dry or itchy skin -hair loss -headache -loss of appetite -sensitivity of eyes to the light -stomach upset -unusually teary eyes This list may not describe all possible side effects. Call your doctor for medical advice about side effects. You may report side effects to FDA at 1-800-FDA-1088. Where should I keep my medicine? This drug is given in a hospital or clinic and will not be stored at home. NOTE: This sheet is a summary. It may not cover all possible information. If you have questions about this medicine, talk to your doctor, pharmacist, or health care provider.  2015, Elsevier/Gold Standard. (2008-01-01 13:53:16)  Leucovorin injection What is this medicine?  LEUCOVORIN (loo koe VOR in) is used to prevent or treat the harmful effects of some medicines. This medicine is used to treat anemia caused by a low amount of folic acid in the body. It is also used with  5-fluorouracil (5-FU) to treat colon cancer. This medicine may be used for other purposes; ask your health care provider or pharmacist if you have questions. What should I tell my health care provider before I take this medicine? They need to know if you have any of these conditions: -anemia from low levels of vitamin B-12 in the blood -an unusual or allergic reaction to leucovorin, folic acid, other medicines, foods, dyes, or preservatives -pregnant or trying to get pregnant -breast-feeding How should I use this medicine? This medicine is for injection into a muscle or into a vein. It is given by a health care professional in a hospital or clinic setting. Talk to your pediatrician regarding the use of this medicine in children. Special care may be needed. Overdosage: If you think you have taken too much of this medicine contact a poison control center or emergency room at once. NOTE: This medicine is only for you. Do not share this medicine with others. What if I miss a dose? This does not apply. What may interact with this medicine? -capecitabine -fluorouracil -phenobarbital -phenytoin -primidone -trimethoprim-sulfamethoxazole This list may not describe all possible interactions. Give your health care provider a list of all the medicines, herbs, non-prescription drugs, or dietary supplements you use. Also tell them if you smoke, drink alcohol, or use illegal drugs. Some items may interact with your medicine. What should I watch for while using this medicine? Your condition will be monitored carefully while you are receiving this medicine. This medicine may increase the side effects of 5-fluorouracil, 5-FU. Tell your doctor or health care professional if you have diarrhea or mouth sores that do not get better or that get worse. What side effects may I notice from receiving this medicine? Side effects that you should report to your doctor or health care professional as soon as  possible: -allergic reactions like skin rash, itching or hives, swelling of the face, lips, or tongue -breathing problems -fever, infection -mouth sores -unusual bleeding or bruising -unusually weak or tired Side effects that usually do not require medical attention (report to your doctor or health care professional if they continue or are bothersome): -constipation or diarrhea -loss of appetite -nausea, vomiting This list may not describe all possible side effects. Call your doctor for medical advice about side effects. You may report side effects to FDA at 1-800-FDA-1088. Where should I keep my medicine? This drug is given in a hospital or clinic and will not be stored at home. NOTE: This sheet is a summary. It may not cover all possible information. If you have questions about this medicine, talk to your doctor, pharmacist, or health care provider.  2015, Elsevier/Gold Standard. (2008-03-03 16:50:29)

## 2014-12-30 NOTE — Progress Notes (Addendum)
[place] OFFICE PROGRESS NOTE   Diagnosis: Colon cancer   INTERVAL HISTORY:   Martha Becker returns as scheduled. She completed cycle 2 FOLFOX on 12/16/2014. She had very minimal nausea that did not require medication. No diarrhea. Cold sensitivity lasted 5-6 days. No persistent neuropathy symptoms. She felt "nervous" for a few days after the treatment. She occasionally notes blood with nose blowing. No other bleeding. No chest pain or shortness of breath. No abdominal pain. She denies leg swelling and calf pain. She continues to have intermittent left hip pain which tends to be positional. She wonders if she has arthritis or a "bone spur". She has noted a hard vein in the region of the left wrist.  Objective:  Vital signs in last 24 hours:  Blood pressure 146/89, pulse 72, temperature 97.8 F (36.6 C), temperature source Oral, resp. rate 18, height _0  (1.676 m), weight 144 lb 14.4 oz (65.726 kg), SpO2 100 %.    HEENT: No thrush or ulcers. Resp: Lungs clear bilaterally. Cardio: Regular rate and rhythm. GI: Abdomen soft and nontender. No hepatomegaly. Vascular: No leg edema. Calves soft and nontender. Superficial cord left hand/wrist. No erythema. No arm edema. Neuro: Vibratory sense intact over the fingertips per tuning fork exam.  Skin: No rash. Port-A-Cath without erythema.    Lab Results:  Lab Results  Component Value Date   WBC 5.0 12/30/2014   HGB 11.6 12/30/2014   HCT 34.8 12/30/2014   MCV 85.9 12/30/2014   PLT 135* 12/30/2014   NEUTROABS 2.7 12/30/2014    Imaging:  No results found.  Medications: I have reviewed the patient's current medications.  Assessment/Plan: 1. 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, status post a right colectomy 08/31/2014  no loss of mismatch repair protein expression, microsatellite stable  K-ras wild-type by standard testing  Staging CT scans December 2015  and PET scan 09/22/2014 consistent with metastatic bilateral lung nodules, metastatic retroperitoneal lymphadenopathy, and a probable left liver metastasis  Cycle 1 CAPOX 10/29/2014 (Xeloda discontinued day 9 secondary to diarrhea)   CT 11/14/2014 revealed progressive metastatic disease at the lung bases and liver compared to a PET scan from 09/22/2014, mild improvement in retroperitoneal lymphadenopathy  Cycle 1 FOLFOX 12/02/2014  Cycle 2 FOLFOX, Avastin added 12/16/2014  Cycle 3 FOLFOX/Avastin 12/30/2014 2. Elevated TSH, PET scan 09/22/2014 with diffuse increased metabolic uptake in the thyroid   3. Family history of multiple cancers including colon cancer in her paternal grandfather and rectal cancer in a paternal first cousin  93. Admission 11/14/2014 with nausea and diarrhea, most likely secondary to capecitabine induced enteritis-resolved. CT 11/14/2014 consistent with ileitis  5.     Superficial phlebitis left hand/wrist 12/30/2014.    Disposition: Martha Becker appears stable. Plan to proceed with cycle 3 FOLFOX/Avastin today as scheduled.  We will follow-up on the CEA from today.   She will return for a follow-up visit and chemotherapy in 2 weeks. The plan is to obtain restaging CT scans after completion of 5 cycles of systemic therapy.   She will contact the office prior to her next visit with any problems. We specifically discussed erythema associated with the area of superficial phlebitis at the left hand and/or arm edema.  Patient seen with Dr. Benay Spice.   Ned Card ANP/GNP-BC   12/30/2014  11:31 AM  This was a shared visit with Ned Card. Martha Becker is tolerating the chemotherapy well. She appears to have a small cord at the  dorsum of the left hand.  [name], M.D.

## 2014-12-30 NOTE — Progress Notes (Signed)
[place] Work  Clinical Social Work was referred by patient for assessment of psychosocial needs.  Clinical Social Worker met with patient at [facility] during infusion to offer support and assess for needs.  CSW and pt reviewed copay assistance programs and other options for assistance. Pt still not open to applying for MCD. Pt overall is doing well today and was in good spirits. CSW to continue to follow and assist.   Clinical Social Work interventions: Resource education Supportive listening  Loren Racer, [place]  [place] Phone: [phone] Fax: [phone]

## 2014-12-30 NOTE — Telephone Encounter (Signed)
Gave avs & calendar for May. Sent message to schedule treatment. °

## 2014-12-31 LAB — CEA: CEA: 2.8 ng/mL (ref 0.0–5.0)

## 2015-01-01 ENCOUNTER — Ambulatory Visit (HOSPITAL_BASED_OUTPATIENT_CLINIC_OR_DEPARTMENT_OTHER): Payer: 59

## 2015-01-01 VITALS — BP 135/82 | HR 60 | Temp 98.6°F

## 2015-01-01 DIAGNOSIS — C7802 Secondary malignant neoplasm of left lung: Secondary | ICD-10-CM

## 2015-01-01 DIAGNOSIS — C182 Malignant neoplasm of ascending colon: Secondary | ICD-10-CM

## 2015-01-01 DIAGNOSIS — C7801 Secondary malignant neoplasm of right lung: Secondary | ICD-10-CM | POA: Diagnosis not present

## 2015-01-01 DIAGNOSIS — C180 Malignant neoplasm of cecum: Secondary | ICD-10-CM

## 2015-01-01 MED ORDER — HEPARIN SOD (PORK) LOCK FLUSH 100 UNIT/ML IV SOLN
500.0000 [IU] | Freq: Once | INTRAVENOUS | Status: AC | PRN
  Administered 2015-01-01: 500 [IU]
  Filled 2015-01-01: qty 5

## 2015-01-01 MED ORDER — SODIUM CHLORIDE 0.9 % IJ SOLN
10.0000 mL | INTRAMUSCULAR | Status: DC | PRN
  Administered 2015-01-01: 10 mL
  Filled 2015-01-01: qty 10

## 2015-01-01 NOTE — Patient Instructions (Signed)
Fluorouracil, 5FU; Diclofenac topical cream What is this medicine? FLUOROURACIL; DICLOFENAC (flure oh YOOR a sil; dye KLOE fen ak) is a combination of a topical chemotherapy agent and non-steroidal anti-inflammatory drug (NSAID). It is used on the skin to treat skin cancer and skin conditions that could become cancer. This medicine may be used for other purposes; ask your health care provider or pharmacist if you have questions. COMMON BRAND NAME(S): FLUORAC What should I tell my health care provider before I take this medicine? They need to know if you have any of these conditions: -bleeding problems -cigarette smoker -DPD enzyme deficiency -heart disease -high blood pressure -if you frequently drink alcohol containing drinks -kidney disease -liver disease -open or infected skin -stomach problems -swelling or open sores at the treatment site -recent or planned coronary artery bypass graft (CABG) surgery -an unusual or allergic reaction to fluorouracil, diclofenac, aspirin, other NSAIDs, other medicines, foods, dyes, or preservatives -pregnant or trying to get pregnant -breast-feeding How should I use this medicine? This medicine is only for use on the skin. Follow the directions on the prescription label. Wash hands before and after use. Wash affected area and gently pat dry. To apply this medicine use a cotton-tipped applicator, or use gloves if applying with fingertips. If applied with unprotected fingertips, it is very important to wash your hands well after you apply this medicine. Avoid applying to the eyes, nose, or mouth. Apply enough medicine to cover the affected area. You can cover the area with a light gauze dressing, but do not use tight or air-tight dressings. Finish the full course prescribed by your doctor or health care professional, even if you think your condition is better. Do not stop taking except on the advice of your doctor or health care professional. Talk to your  pediatrician regarding the use of this medicine in children. Special care may be needed. Overdosage: If you think you've taken too much of this medicine contact a poison control center or emergency room at once. Overdosage: If you think you have taken too much of this medicine contact a poison control center or emergency room at once. NOTE: This medicine is only for you. Do not share this medicine with others. What if I miss a dose? If you miss a dose, apply it as soon as you can. If it is almost time for your next dose, only use that dose. Do not apply extra doses. Contact your doctor or health care professional if you miss more than one dose. What may interact with this medicine? Interactions are not expected. Do not use any other skin products without telling your doctor or health care professional. This list may not describe all possible interactions. Give your health care provider a list of all the medicines, herbs, non-prescription drugs, or dietary supplements you use. Also tell them if you smoke, drink alcohol, or use illegal drugs. Some items may interact with your medicine. What should I watch for while using this medicine? Visit your doctor or health care professional for checks on your progress. You will need to use this medicine for 2 to 6 weeks. This may be longer depending on the condition being treated. You may not see full healing for another 1 to 2 months after you stop using the medicine. Treated areas of skin can look unsightly during and for several weeks after treatment with this medicine. This medicine can make you more sensitive to the sun. Keep out of the sun. If you cannot avoid being in   the sun, wear protective clothing and use sunscreen. Do not use sun lamps or tanning beds/booths. Where should I keep my What side effects may I notice from receiving this medicine? Side effects that you should report to your doctor or health care professional as soon as possible: -allergic  reactions like skin rash, itching or hives, swelling of the face, lips, or tongue -black or bloody stools, blood in the urine or vomit -blurred vision -chest pain -difficulty breathing or wheezing -redness, blistering, peeling or loosening of the skin, including inside the mouth -severe redness and swelling of normal skin -slurred speech or weakness on one side of the body -trouble passing urine or change in the amount of urine -unexplained weight gain or swelling -unusually weak or tired -yellowing of eyes or skin Side effects that usually do not require medical attention (Report these to your doctor or health care professional if they continue or are bothersome.): -increased sensitivity of the skin to sun and ultraviolet light -pain and burning of the affected area -scaling or swelling of the affected area -skin rash, itching of the affected area -tenderness This list may not describe all possible side effects. Call your doctor for medical advice about side effects. You may report side effects to FDA at 1-800-FDA-1088. Where should I keep my medicine? Keep out of the reach of children. Store at room temperature between 20 and 25 degrees C (68 and 77 degrees F). Throw away any unused medicine after the expiration date. NOTE: This sheet is a summary. It may not cover all possible information. If you have questions about this medicine, talk to your doctor, pharmacist, or health care provider.  2015, Elsevier/Gold Standard. (2013-12-29 11:09:58)  

## 2015-01-04 ENCOUNTER — Telehealth: Payer: Self-pay | Admitting: *Deleted

## 2015-01-04 NOTE — Telephone Encounter (Signed)
VM received from 11:10 am from patient stating that her symptoms after her last chemo were more severe in nature though patient did not specify which symptoms in particluar. Had folfox on 12/30/14.  Attempted call back with no answer. Left VM for patient to return call when she is able.

## 2015-01-05 NOTE — Telephone Encounter (Signed)
PT. RETURNED CALL. SHE STATES AFTER THE LAST TWO CHEMOTHERAPY TREATMENTS FOR THREE DAYS SHE HAD SEVERE DEPRESSION. IS THERE SOMETHING PT. CAN TAKE THE FEW DAYS AFTER CHEMOTHERAPY FOR THIS DEPRESSION? PT.'S PHARMACY IS [facility] IN [place].

## 2015-01-05 NOTE — Telephone Encounter (Signed)
Returned call to pt/no answer.  Left voice message to call office back tomorrow to discuss symptoms after chemo.

## 2015-01-10 ENCOUNTER — Other Ambulatory Visit: Payer: Self-pay | Admitting: Oncology

## 2015-01-13 ENCOUNTER — Telehealth: Payer: Self-pay | Admitting: Oncology

## 2015-01-13 ENCOUNTER — Other Ambulatory Visit (HOSPITAL_BASED_OUTPATIENT_CLINIC_OR_DEPARTMENT_OTHER): Payer: 59

## 2015-01-13 ENCOUNTER — Ambulatory Visit (HOSPITAL_BASED_OUTPATIENT_CLINIC_OR_DEPARTMENT_OTHER): Payer: 59 | Admitting: Oncology

## 2015-01-13 ENCOUNTER — Ambulatory Visit: Payer: 59 | Admitting: Oncology

## 2015-01-13 ENCOUNTER — Other Ambulatory Visit: Payer: 59

## 2015-01-13 ENCOUNTER — Ambulatory Visit (HOSPITAL_BASED_OUTPATIENT_CLINIC_OR_DEPARTMENT_OTHER): Payer: 59

## 2015-01-13 VITALS — BP 133/80 | HR 70 | Temp 97.7°F | Resp 19 | Ht 66.0 in | Wt 148.1 lb

## 2015-01-13 DIAGNOSIS — C7801 Secondary malignant neoplasm of right lung: Secondary | ICD-10-CM

## 2015-01-13 DIAGNOSIS — Z808 Family history of malignant neoplasm of other organs or systems: Secondary | ICD-10-CM

## 2015-01-13 DIAGNOSIS — C182 Malignant neoplasm of ascending colon: Secondary | ICD-10-CM | POA: Diagnosis not present

## 2015-01-13 DIAGNOSIS — Z5111 Encounter for antineoplastic chemotherapy: Secondary | ICD-10-CM

## 2015-01-13 DIAGNOSIS — C772 Secondary and unspecified malignant neoplasm of intra-abdominal lymph nodes: Secondary | ICD-10-CM | POA: Diagnosis not present

## 2015-01-13 DIAGNOSIS — C180 Malignant neoplasm of cecum: Secondary | ICD-10-CM

## 2015-01-13 DIAGNOSIS — Z5112 Encounter for antineoplastic immunotherapy: Secondary | ICD-10-CM | POA: Diagnosis not present

## 2015-01-13 DIAGNOSIS — C7802 Secondary malignant neoplasm of left lung: Secondary | ICD-10-CM

## 2015-01-13 DIAGNOSIS — I808 Phlebitis and thrombophlebitis of other sites: Secondary | ICD-10-CM

## 2015-01-13 DIAGNOSIS — F329 Major depressive disorder, single episode, unspecified: Secondary | ICD-10-CM

## 2015-01-13 LAB — CBC WITH DIFFERENTIAL/PLATELET
BASO%: 0.6 % (ref 0.0–2.0)
BASOS ABS: 0 10*3/uL (ref 0.0–0.1)
EOS%: 4.4 % (ref 0.0–7.0)
Eosinophils Absolute: 0.2 10*3/uL (ref 0.0–0.5)
HEMATOCRIT: 35.4 % (ref 34.8–46.6)
HGB: 11.6 g/dL (ref 11.6–15.9)
LYMPH%: 26.6 % (ref 14.0–49.7)
MCH: 28.5 pg (ref 25.1–34.0)
MCHC: 32.8 g/dL (ref 31.5–36.0)
MCV: 87 fL (ref 79.5–101.0)
MONO#: 0.7 10*3/uL (ref 0.1–0.9)
MONO%: 14.8 % — AB (ref 0.0–14.0)
NEUT#: 2.6 10*3/uL (ref 1.5–6.5)
NEUT%: 53.6 % (ref 38.4–76.8)
PLATELETS: 136 10*3/uL — AB (ref 145–400)
RBC: 4.07 10*6/uL (ref 3.70–5.45)
RDW: 18.9 % — ABNORMAL HIGH (ref 11.2–14.5)
WBC: 4.9 10*3/uL (ref 3.9–10.3)
lymph#: 1.3 10*3/uL (ref 0.9–3.3)

## 2015-01-13 LAB — UA PROTEIN, DIPSTICK - CHCC: PROTEIN: NEGATIVE mg/dL

## 2015-01-13 LAB — COMPREHENSIVE METABOLIC PANEL (CC13)
ALBUMIN: 3.7 g/dL (ref 3.5–5.0)
ALT: 24 U/L (ref 0–55)
ANION GAP: 10 meq/L (ref 3–11)
AST: 22 U/L (ref 5–34)
Alkaline Phosphatase: 73 U/L (ref 40–150)
BUN: 12.7 mg/dL (ref 7.0–26.0)
CO2: 23 mEq/L (ref 22–29)
CREATININE: 0.8 mg/dL (ref 0.6–1.1)
Calcium: 9.4 mg/dL (ref 8.4–10.4)
Chloride: 107 mEq/L (ref 98–109)
EGFR: 79 mL/min/{1.73_m2} — ABNORMAL LOW (ref >90)
Glucose: 77 mg/dl (ref 70–140)
POTASSIUM: 4 meq/L (ref 3.5–5.1)
Sodium: 140 mEq/L (ref 136–145)
TOTAL PROTEIN: 6.5 g/dL (ref 6.4–8.3)
Total Bilirubin: 0.32 mg/dL (ref 0.20–1.20)

## 2015-01-13 MED ORDER — SODIUM CHLORIDE 0.9 % IV SOLN
5.0000 mg/kg | Freq: Once | INTRAVENOUS | Status: AC
  Administered 2015-01-13: 325 mg via INTRAVENOUS
  Filled 2015-01-13: qty 13

## 2015-01-13 MED ORDER — DEXTROSE 5 % IV SOLN
Freq: Once | INTRAVENOUS | Status: AC
  Administered 2015-01-13: 12:00:00 via INTRAVENOUS

## 2015-01-13 MED ORDER — OXALIPLATIN CHEMO INJECTION 100 MG/20ML
85.0000 mg/m2 | Freq: Once | INTRAVENOUS | Status: AC
  Administered 2015-01-13: 145 mg via INTRAVENOUS
  Filled 2015-01-13: qty 29

## 2015-01-13 MED ORDER — SODIUM CHLORIDE 0.9 % IV SOLN
Freq: Once | INTRAVENOUS | Status: AC
  Administered 2015-01-13: 11:00:00 via INTRAVENOUS

## 2015-01-13 MED ORDER — PALONOSETRON HCL INJECTION 0.25 MG/5ML
0.2500 mg | Freq: Once | INTRAVENOUS | Status: AC
  Administered 2015-01-13: 0.25 mg via INTRAVENOUS

## 2015-01-13 MED ORDER — SODIUM CHLORIDE 0.9 % IV SOLN
Freq: Once | INTRAVENOUS | Status: AC
  Administered 2015-01-13: 11:00:00 via INTRAVENOUS
  Filled 2015-01-13: qty 5

## 2015-01-13 MED ORDER — SODIUM CHLORIDE 0.9 % IV SOLN
1800.0000 mg/m2 | INTRAVENOUS | Status: DC
  Administered 2015-01-13: 3100 mg via INTRAVENOUS
  Filled 2015-01-13: qty 62

## 2015-01-13 MED ORDER — PALONOSETRON HCL INJECTION 0.25 MG/5ML
INTRAVENOUS | Status: AC
  Filled 2015-01-13: qty 5

## 2015-01-13 MED ORDER — LEUCOVORIN CALCIUM INJECTION 350 MG
400.0000 mg/m2 | Freq: Once | INTRAVENOUS | Status: AC
  Administered 2015-01-13: 688 mg via INTRAVENOUS
  Filled 2015-01-13: qty 34.4

## 2015-01-13 NOTE — Telephone Encounter (Signed)
Pt confirmed labs/ov per 05/04 POF, sent msg to add chemo pt to be give schedule in chemo.... KJ

## 2015-01-13 NOTE — Patient Instructions (Addendum)
[facility] Discharge Instructions for Patients Receiving Chemotherapy  Today you received the following chemotherapy agents Oxaliplatin, Leucovorin, and 5FU  To help prevent nausea and vomiting after your treatment, we encourage you to take your nausea medication Compazine 10 mg every 6 hours as needed   If you develop nausea and vomiting that is not controlled by your nausea medication, call the clinic.   BELOW ARE SYMPTOMS THAT SHOULD BE REPORTED IMMEDIATELY:  *FEVER GREATER THAN 100.5 F  *CHILLS WITH OR WITHOUT FEVER  NAUSEA AND VOMITING THAT IS NOT CONTROLLED WITH YOUR NAUSEA MEDICATION  *UNUSUAL SHORTNESS OF BREATH  *UNUSUAL BRUISING OR BLEEDING  TENDERNESS IN MOUTH AND THROAT WITH OR WITHOUT PRESENCE OF ULCERS  *URINARY PROBLEMS  *BOWEL PROBLEMS  UNUSUAL RASH Items with * indicate a potential emergency and should be followed up as soon as possible.  Feel free to call the clinic you have any questions or concerns. The clinic phone number is [phone].  Please show the CHEMO ALERT CARD at check-in to the Emergency Department and triage nurse.   

## 2015-01-13 NOTE — Progress Notes (Signed)
  [facility] OFFICE PROGRESS NOTE   Diagnosis: Colon cancer  INTERVAL HISTORY:   Martha Becker completed another cycle of FOLFOX/Avastin 12/30/2014. She reports feeling very "depressed "on day 4. She was sat and tearful. The symptoms resolved over a few days. She has persistent cold sensitivity. She otherwise feels well. No bleeding or symptom of thrombosis.  Objective:  Vital signs in last 24 hours:  Blood pressure 133/80, pulse 70, temperature 97.7 F (36.5 C), temperature source Oral, resp. rate 19, height $RemoveBe'5\' 6"'mKIVjRLMN$  (1.676 m), weight 148 lb 1.6 oz (67.178 kg), SpO2 100 %.    HEENT: No thrush or ulcers Resp: Lungs clear bilaterally Cardio: Regular rate and rhythm GI: No hepatosplenomegaly, nontender Vascular: No leg edema Neuro: The vibratory sense is intact at the fingertips bilaterally     Portacath/PICC-without erythema  Lab Results:  Lab Results  Component Value Date   WBC 4.9 01/13/2015   HGB 11.6 01/13/2015   HCT 35.4 01/13/2015   MCV 87.0 01/13/2015   PLT 136* 01/13/2015   NEUTROABS 2.6 01/13/2015      Lab Results  Component Value Date   CEA 2.8 12/30/2014    Medications: I have reviewed the patient's current medications.  Assessment/Plan: 1. 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, status post a right colectomy 08/31/2014  no loss of mismatch repair protein expression, microsatellite stable  K-ras wild-type by standard testing  Staging CT scans December 2015 and PET scan 09/22/2014 consistent with metastatic bilateral lung nodules, metastatic retroperitoneal lymphadenopathy, and a probable left liver metastasis  Cycle 1 CAPOX 10/29/2014 (Xeloda discontinued day 9 secondary to diarrhea)   CT 11/14/2014 revealed progressive metastatic disease at the lung bases and liver compared to a PET scan from 09/22/2014, mild improvement in retroperitoneal lymphadenopathy  Cycle 1 FOLFOX  12/02/2014  Cycle 2 FOLFOX, Avastin added 12/16/2014  Cycle 3 FOLFOX/Avastin 12/30/2014  Cycle 4 FOLFOX/Avastin 01/13/2015 2. Elevated TSH, PET scan 09/22/2014 with diffuse increased metabolic uptake in the thyroid   3. Family history of multiple cancers including colon cancer in her paternal grandfather and rectal cancer in a paternal first cousin  61. Admission 11/14/2014 with nausea and diarrhea, most likely secondary to capecitabine induced enteritis-resolved. CT 11/14/2014 consistent with ileitis  5.Superficial phlebitis left hand/wrist 12/30/2014.   Disposition:  [name] has completed 5 cycles of systemic therapy, 4 cycles of FOLFOX. She appears to be tolerating the chemotherapy well. The etiology of the acute depression symptoms following chemotherapy last cycle is unclear. It is possible this is related to Decadron. We will decrease the Decadron dose with this cycle. She declined an antidepressant. We had a long discussion regarding the expected course of chemotherapy and other treatment options. She understands no therapy will be curative. We discussed a treatment break after approximately 10 cycles of FOLFOX versus maintenance therapy.  The plan is to complete 2 additional cycles of FOLFOX/Avastin prior to a restaging CT evaluation.  Betsy Coder, MD  01/13/2015  1:02 PM

## 2015-01-13 NOTE — [facility] Oncology Navigator Note (Signed)
Met with patient during physician visit to provide support and assess for needs to promote continuity of care. 1. Discussed maintenance chemo vs. Observation approach after she completes her initial chemo-very indecisive 2. She struggles with not knowing her future with certainty-lack of control 3. Still struggles with financial concerns-will have SS Disability in July, but unable to get Medicare for 2 more years-says she is currently living off her savings.  Easily tearful, and feels depressed at times-declines antidepressant at this time. Strongly encouraged her to attend the GI Support group and provided her with information.   , RN, BSN GI Oncology Navigator [facility] 

## 2015-01-14 ENCOUNTER — Telehealth: Payer: Self-pay | Admitting: *Deleted

## 2015-01-14 NOTE — Telephone Encounter (Signed)
Patient left VM requesting a call to discuss a couple issues from her appointment yesterday. Attempted to return call-had to leave VM

## 2015-01-15 ENCOUNTER — Ambulatory Visit (HOSPITAL_BASED_OUTPATIENT_CLINIC_OR_DEPARTMENT_OTHER): Payer: 59

## 2015-01-15 VITALS — BP 111/66 | HR 76 | Temp 98.5°F | Resp 20

## 2015-01-15 DIAGNOSIS — C7801 Secondary malignant neoplasm of right lung: Secondary | ICD-10-CM

## 2015-01-15 DIAGNOSIS — C772 Secondary and unspecified malignant neoplasm of intra-abdominal lymph nodes: Secondary | ICD-10-CM

## 2015-01-15 DIAGNOSIS — C182 Malignant neoplasm of ascending colon: Secondary | ICD-10-CM

## 2015-01-15 DIAGNOSIS — C7802 Secondary malignant neoplasm of left lung: Secondary | ICD-10-CM | POA: Diagnosis not present

## 2015-01-15 DIAGNOSIS — C180 Malignant neoplasm of cecum: Secondary | ICD-10-CM

## 2015-01-15 MED ORDER — HEPARIN SOD (PORK) LOCK FLUSH 100 UNIT/ML IV SOLN
500.0000 [IU] | Freq: Once | INTRAVENOUS | Status: AC | PRN
Start: 2015-01-15 — End: 2015-01-15
  Administered 2015-01-15: 500 [IU]
  Filled 2015-01-15: qty 5

## 2015-01-15 MED ORDER — SODIUM CHLORIDE 0.9 % IJ SOLN
10.0000 mL | INTRAMUSCULAR | Status: DC | PRN
  Administered 2015-01-15: 10 mL
  Filled 2015-01-15: qty 10

## 2015-01-15 NOTE — Patient Instructions (Signed)
Fluorouracil, 5FU; Diclofenac topical cream What is this medicine? FLUOROURACIL; DICLOFENAC (flure oh YOOR a sil; dye KLOE fen ak) is a combination of a topical chemotherapy agent and non-steroidal anti-inflammatory drug (NSAID). It is used on the skin to treat skin cancer and skin conditions that could become cancer. This medicine may be used for other purposes; ask your health care provider or pharmacist if you have questions. COMMON BRAND NAME(S): FLUORAC What should I tell my health care provider before I take this medicine? They need to know if you have any of these conditions: -bleeding problems -cigarette smoker -DPD enzyme deficiency -heart disease -high blood pressure -if you frequently drink alcohol containing drinks -kidney disease -liver disease -open or infected skin -stomach problems -swelling or open sores at the treatment site -recent or planned coronary artery bypass graft (CABG) surgery -an unusual or allergic reaction to fluorouracil, diclofenac, aspirin, other NSAIDs, other medicines, foods, dyes, or preservatives -pregnant or trying to get pregnant -breast-feeding How should I use this medicine? This medicine is only for use on the skin. Follow the directions on the prescription label. Wash hands before and after use. Wash affected area and gently pat dry. To apply this medicine use a cotton-tipped applicator, or use gloves if applying with fingertips. If applied with unprotected fingertips, it is very important to wash your hands well after you apply this medicine. Avoid applying to the eyes, nose, or mouth. Apply enough medicine to cover the affected area. You can cover the area with a light gauze dressing, but do not use tight or air-tight dressings. Finish the full course prescribed by your doctor or health care professional, even if you think your condition is better. Do not stop taking except on the advice of your doctor or health care professional. Talk to your  pediatrician regarding the use of this medicine in children. Special care may be needed. Overdosage: If you think you've taken too much of this medicine contact a poison control center or emergency room at once. Overdosage: If you think you have taken too much of this medicine contact a poison control center or emergency room at once. NOTE: This medicine is only for you. Do not share this medicine with others. What if I miss a dose? If you miss a dose, apply it as soon as you can. If it is almost time for your next dose, only use that dose. Do not apply extra doses. Contact your doctor or health care professional if you miss more than one dose. What may interact with this medicine? Interactions are not expected. Do not use any other skin products without telling your doctor or health care professional. This list may not describe all possible interactions. Give your health care provider a list of all the medicines, herbs, non-prescription drugs, or dietary supplements you use. Also tell them if you smoke, drink alcohol, or use illegal drugs. Some items may interact with your medicine. What should I watch for while using this medicine? Visit your doctor or health care professional for checks on your progress. You will need to use this medicine for 2 to 6 weeks. This may be longer depending on the condition being treated. You may not see full healing for another 1 to 2 months after you stop using the medicine. Treated areas of skin can look unsightly during and for several weeks after treatment with this medicine. This medicine can make you more sensitive to the sun. Keep out of the sun. If you cannot avoid being in   the sun, wear protective clothing and use sunscreen. Do not use sun lamps or tanning beds/booths. Where should I keep my What side effects may I notice from receiving this medicine? Side effects that you should report to your doctor or health care professional as soon as possible: -allergic  reactions like skin rash, itching or hives, swelling of the face, lips, or tongue -black or bloody stools, blood in the urine or vomit -blurred vision -chest pain -difficulty breathing or wheezing -redness, blistering, peeling or loosening of the skin, including inside the mouth -severe redness and swelling of normal skin -slurred speech or weakness on one side of the body -trouble passing urine or change in the amount of urine -unexplained weight gain or swelling -unusually weak or tired -yellowing of eyes or skin Side effects that usually do not require medical attention (Report these to your doctor or health care professional if they continue or are bothersome.): -increased sensitivity of the skin to sun and ultraviolet light -pain and burning of the affected area -scaling or swelling of the affected area -skin rash, itching of the affected area -tenderness This list may not describe all possible side effects. Call your doctor for medical advice about side effects. You may report side effects to FDA at 1-800-FDA-1088. Where should I keep my medicine? Keep out of the reach of children. Store at room temperature between 20 and 25 degrees C (68 and 77 degrees F). Throw away any unused medicine after the expiration date. NOTE: This sheet is a summary. It may not cover all possible information. If you have questions about this medicine, talk to your doctor, pharmacist, or health care provider.  2015, Elsevier/Gold Standard. (2013-12-29 11:09:58)  

## 2015-01-24 ENCOUNTER — Other Ambulatory Visit: Payer: Self-pay | Admitting: Oncology

## 2015-01-27 ENCOUNTER — Ambulatory Visit (HOSPITAL_BASED_OUTPATIENT_CLINIC_OR_DEPARTMENT_OTHER): Payer: 59

## 2015-01-27 ENCOUNTER — Ambulatory Visit (HOSPITAL_BASED_OUTPATIENT_CLINIC_OR_DEPARTMENT_OTHER): Payer: 59 | Admitting: Nurse Practitioner

## 2015-01-27 ENCOUNTER — Other Ambulatory Visit (HOSPITAL_BASED_OUTPATIENT_CLINIC_OR_DEPARTMENT_OTHER): Payer: 59

## 2015-01-27 ENCOUNTER — Telehealth: Payer: Self-pay | Admitting: Oncology

## 2015-01-27 VITALS — BP 130/76 | HR 70 | Temp 98.0°F | Resp 18 | Wt 152.4 lb

## 2015-01-27 DIAGNOSIS — C772 Secondary and unspecified malignant neoplasm of intra-abdominal lymph nodes: Secondary | ICD-10-CM | POA: Diagnosis not present

## 2015-01-27 DIAGNOSIS — C180 Malignant neoplasm of cecum: Secondary | ICD-10-CM

## 2015-01-27 DIAGNOSIS — C7802 Secondary malignant neoplasm of left lung: Secondary | ICD-10-CM

## 2015-01-27 DIAGNOSIS — Z5111 Encounter for antineoplastic chemotherapy: Secondary | ICD-10-CM | POA: Diagnosis not present

## 2015-01-27 DIAGNOSIS — C7801 Secondary malignant neoplasm of right lung: Secondary | ICD-10-CM

## 2015-01-27 DIAGNOSIS — Z5112 Encounter for antineoplastic immunotherapy: Secondary | ICD-10-CM

## 2015-01-27 DIAGNOSIS — I808 Phlebitis and thrombophlebitis of other sites: Secondary | ICD-10-CM

## 2015-01-27 DIAGNOSIS — C182 Malignant neoplasm of ascending colon: Secondary | ICD-10-CM

## 2015-01-27 DIAGNOSIS — C801 Malignant (primary) neoplasm, unspecified: Secondary | ICD-10-CM

## 2015-01-27 DIAGNOSIS — C7800 Secondary malignant neoplasm of unspecified lung: Secondary | ICD-10-CM

## 2015-01-27 DIAGNOSIS — C786 Secondary malignant neoplasm of retroperitoneum and peritoneum: Secondary | ICD-10-CM

## 2015-01-27 DIAGNOSIS — Z800 Family history of malignant neoplasm of digestive organs: Secondary | ICD-10-CM

## 2015-01-27 DIAGNOSIS — Z809 Family history of malignant neoplasm, unspecified: Secondary | ICD-10-CM

## 2015-01-27 LAB — COMPREHENSIVE METABOLIC PANEL (CC13)
ALT: 27 U/L (ref 0–55)
ANION GAP: 10 meq/L (ref 3–11)
AST: 27 U/L (ref 5–34)
Albumin: 3.6 g/dL (ref 3.5–5.0)
Alkaline Phosphatase: 74 U/L (ref 40–150)
BUN: 9.4 mg/dL (ref 7.0–26.0)
CALCIUM: 9.5 mg/dL (ref 8.4–10.4)
CHLORIDE: 105 meq/L (ref 98–109)
CO2: 25 meq/L (ref 22–29)
Creatinine: 0.8 mg/dL (ref 0.6–1.1)
EGFR: 77 mL/min/{1.73_m2} — ABNORMAL LOW (ref >90)
Glucose: 87 mg/dl (ref 70–140)
Potassium: 4.3 mEq/L (ref 3.5–5.1)
Sodium: 139 mEq/L (ref 136–145)
Total Bilirubin: 0.58 mg/dL (ref 0.20–1.20)
Total Protein: 6.5 g/dL (ref 6.4–8.3)

## 2015-01-27 LAB — CBC WITH DIFFERENTIAL/PLATELET
BASO%: 0.5 % (ref 0.0–2.0)
BASOS ABS: 0 10*3/uL (ref 0.0–0.1)
EOS%: 2.7 % (ref 0.0–7.0)
Eosinophils Absolute: 0.1 10*3/uL (ref 0.0–0.5)
HEMATOCRIT: 35.2 % (ref 34.8–46.6)
HEMOGLOBIN: 11.6 g/dL (ref 11.6–15.9)
LYMPH%: 20.9 % (ref 14.0–49.7)
MCH: 28.7 pg (ref 25.1–34.0)
MCHC: 32.9 g/dL (ref 31.5–36.0)
MCV: 87.3 fL (ref 79.5–101.0)
MONO#: 0.8 10*3/uL (ref 0.1–0.9)
MONO%: 16.1 % — AB (ref 0.0–14.0)
NEUT#: 3 10*3/uL (ref 1.5–6.5)
NEUT%: 59.8 % (ref 38.4–76.8)
PLATELETS: 118 10*3/uL — AB (ref 145–400)
RBC: 4.03 10*6/uL (ref 3.70–5.45)
RDW: 19.6 % — ABNORMAL HIGH (ref 11.2–14.5)
WBC: 5.1 10*3/uL (ref 3.9–10.3)
lymph#: 1.1 10*3/uL (ref 0.9–3.3)

## 2015-01-27 LAB — UA PROTEIN, DIPSTICK - CHCC: PROTEIN: NEGATIVE mg/dL

## 2015-01-27 MED ORDER — PALONOSETRON HCL INJECTION 0.25 MG/5ML
0.2500 mg | Freq: Once | INTRAVENOUS | Status: AC
Start: 2015-01-27 — End: 2015-01-27
  Administered 2015-01-27: 0.25 mg via INTRAVENOUS

## 2015-01-27 MED ORDER — LEUCOVORIN CALCIUM INJECTION 350 MG
400.0000 mg/m2 | Freq: Once | INTRAVENOUS | Status: AC
  Administered 2015-01-27: 688 mg via INTRAVENOUS
  Filled 2015-01-27: qty 34.4

## 2015-01-27 MED ORDER — SODIUM CHLORIDE 0.9 % IV SOLN
Freq: Once | INTRAVENOUS | Status: AC
  Administered 2015-01-27: 12:00:00 via INTRAVENOUS

## 2015-01-27 MED ORDER — SODIUM CHLORIDE 0.9 % IV SOLN
Freq: Once | INTRAVENOUS | Status: AC
  Administered 2015-01-27: 12:00:00 via INTRAVENOUS
  Filled 2015-01-27: qty 5

## 2015-01-27 MED ORDER — FLUOROURACIL CHEMO INJECTION 5 GM/100ML
1800.0000 mg/m2 | INTRAVENOUS | Status: DC
  Administered 2015-01-27: 3100 mg via INTRAVENOUS
  Filled 2015-01-27: qty 62

## 2015-01-27 MED ORDER — SODIUM CHLORIDE 0.9 % IV SOLN
5.0000 mg/kg | Freq: Once | INTRAVENOUS | Status: AC
  Administered 2015-01-27: 325 mg via INTRAVENOUS
  Filled 2015-01-27: qty 13

## 2015-01-27 MED ORDER — PALONOSETRON HCL INJECTION 0.25 MG/5ML
INTRAVENOUS | Status: AC
  Filled 2015-01-27: qty 5

## 2015-01-27 MED ORDER — DEXTROSE 5 % IV SOLN
Freq: Once | INTRAVENOUS | Status: AC
  Administered 2015-01-27: 13:00:00 via INTRAVENOUS

## 2015-01-27 MED ORDER — OXALIPLATIN CHEMO INJECTION 100 MG/20ML
85.0000 mg/m2 | Freq: Once | INTRAVENOUS | Status: AC
  Administered 2015-01-27: 145 mg via INTRAVENOUS
  Filled 2015-01-27: qty 29

## 2015-01-27 NOTE — Telephone Encounter (Signed)
Gave and printed appt sched and avs for pt for May and JUNE °

## 2015-01-27 NOTE — Patient Instructions (Signed)
[facility] Discharge Instructions for Patients Receiving Chemotherapy  Today you received the following chemotherapy agents Avastin/Oxaliplatin/Leucovorin/5 FU To help prevent nausea and vomiting after your treatment, we encourage you to take your nausea medication as prescribed. If you develop nausea and vomiting that is not controlled by your nausea medication, call the clinic.   BELOW ARE SYMPTOMS THAT SHOULD BE REPORTED IMMEDIATELY:  *FEVER GREATER THAN 100.5 F  *CHILLS WITH OR WITHOUT FEVER  NAUSEA AND VOMITING THAT IS NOT CONTROLLED WITH YOUR NAUSEA MEDICATION  *UNUSUAL SHORTNESS OF BREATH  *UNUSUAL BRUISING OR BLEEDING  TENDERNESS IN MOUTH AND THROAT WITH OR WITHOUT PRESENCE OF ULCERS  *URINARY PROBLEMS  *BOWEL PROBLEMS  UNUSUAL RASH Items with * indicate a potential emergency and should be followed up as soon as possible.  Feel free to call the clinic you have any questions or concerns. The clinic phone number is [phone].  Please show the CHEMO ALERT CARD at check-in to the Emergency Department and triage nurse.   

## 2015-01-27 NOTE — Progress Notes (Signed)
  [facility] OFFICE PROGRESS NOTE   Diagnosis:  Colon cancer  INTERVAL HISTORY:   Martha Becker returns as scheduled. She completed cycle 4 FOLFOX/Avastin 01/13/2015. She denies nausea/vomiting. No mouth sores. No diarrhea. Cold sensitivity lasted 5-7 days. No persistent neuropathy symptoms. She noted less "depression" following the last cycle which she attributes to a decrease in the dose of Decadron.  Objective:  Vital signs in last 24 hours:  Temperature 98.0, heart rate 70, blood pressure 130/76, weight 152.4, oxygen saturation 100%    HEENT: No thrush or ulcers. Resp: Lungs clear bilaterally. Cardio: Regular rate and rhythm. GI: Abdomen soft and nontender. No hepatomegaly. Vascular: No leg edema. Calves soft and nontender. Neuro: Vibratory sense intact over the fingertips per tuning fork exam.  Skin: No rash. Port-A-Cath without erythema.    Lab Results:  Lab Results  Component Value Date   WBC 5.1 01/27/2015   HGB 11.6 01/27/2015   HCT 35.2 01/27/2015   MCV 87.3 01/27/2015   PLT 118* 01/27/2015   NEUTROABS 3.0 01/27/2015    Imaging:  No results found.  Medications: I have reviewed the patient's current medications.  Assessment/Plan: 1. 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, status post a right colectomy 08/31/2014  no loss of mismatch repair protein expression, microsatellite stable  K-ras wild-type by standard testing  Staging CT scans December 2015 and PET scan 09/22/2014 consistent with metastatic bilateral lung nodules, metastatic retroperitoneal lymphadenopathy, and a probable left liver metastasis  Cycle 1 CAPOX 10/29/2014 (Xeloda discontinued day 9 secondary to diarrhea)   CT 11/14/2014 revealed progressive metastatic disease at the lung bases and liver compared to a PET scan from 09/22/2014, mild improvement in retroperitoneal lymphadenopathy  Cycle 1 FOLFOX  12/02/2014  Cycle 2 FOLFOX, Avastin added 12/16/2014  Cycle 3 FOLFOX/Avastin 12/30/2014  Cycle 4 FOLFOX/Avastin 01/13/2015  Cycle 5 FOLFOX/Avastin 01/27/2015 2. Elevated TSH, PET scan 09/22/2014 with diffuse increased metabolic uptake in the thyroid   3. Family history of multiple cancers including colon cancer in her paternal grandfather and rectal cancer in a paternal first cousin  38. Admission 11/14/2014 with nausea and diarrhea, most likely secondary to capecitabine induced enteritis-resolved. CT 11/14/2014 consistent with ileitis  5.Superficial phlebitis left hand/wrist 12/30/2014.    Disposition: Martha Becker has completed 4 cycles of FOLFOX and 1 cycle of CAPOX. Avastin was added beginning with cycle 2 FOLFOX. Plan to proceed with cycle 5 FOLFOX plus Avastin today as scheduled. Restaging CT scans will be done in approximately 2 weeks. She will return for a follow-up visit and the next cycle of systemic therapy on 02/16/2015. She will contact the office in the interim with any problems.  Plan reviewed with Dr. Benay Spice.    Ned Card ANP/GNP-BC   01/27/2015  11:42 AM

## 2015-01-29 ENCOUNTER — Ambulatory Visit (HOSPITAL_BASED_OUTPATIENT_CLINIC_OR_DEPARTMENT_OTHER): Payer: 59

## 2015-01-29 VITALS — BP 121/76 | HR 72 | Temp 98.0°F | Resp 18

## 2015-01-29 DIAGNOSIS — C182 Malignant neoplasm of ascending colon: Secondary | ICD-10-CM

## 2015-01-29 DIAGNOSIS — C180 Malignant neoplasm of cecum: Secondary | ICD-10-CM

## 2015-01-29 DIAGNOSIS — C7801 Secondary malignant neoplasm of right lung: Secondary | ICD-10-CM | POA: Diagnosis not present

## 2015-01-29 DIAGNOSIS — C7802 Secondary malignant neoplasm of left lung: Secondary | ICD-10-CM | POA: Diagnosis not present

## 2015-01-29 DIAGNOSIS — C772 Secondary and unspecified malignant neoplasm of intra-abdominal lymph nodes: Secondary | ICD-10-CM | POA: Diagnosis not present

## 2015-01-29 MED ORDER — SODIUM CHLORIDE 0.9 % IJ SOLN
10.0000 mL | INTRAMUSCULAR | Status: DC | PRN
  Administered 2015-01-29: 10 mL
  Filled 2015-01-29: qty 10

## 2015-01-29 MED ORDER — HEPARIN SOD (PORK) LOCK FLUSH 100 UNIT/ML IV SOLN
500.0000 [IU] | Freq: Once | INTRAVENOUS | Status: AC | PRN
  Administered 2015-01-29: 500 [IU]
  Filled 2015-01-29: qty 5

## 2015-02-07 ENCOUNTER — Other Ambulatory Visit: Payer: Self-pay | Admitting: Oncology

## 2015-02-10 ENCOUNTER — Other Ambulatory Visit (HOSPITAL_COMMUNITY): Payer: 59

## 2015-02-10 ENCOUNTER — Encounter (HOSPITAL_COMMUNITY): Payer: Self-pay

## 2015-02-10 ENCOUNTER — Ambulatory Visit (HOSPITAL_COMMUNITY)
Admission: RE | Admit: 2015-02-10 | Discharge: 2015-02-10 | Disposition: A | Discharge status: Home / Self Care | Payer: 59 | Source: Ambulatory Visit | Attending: Nurse Practitioner | Admitting: Nurse Practitioner

## 2015-02-10 DIAGNOSIS — C787 Secondary malignant neoplasm of liver and intrahepatic bile duct: Secondary | ICD-10-CM | POA: Insufficient documentation

## 2015-02-10 DIAGNOSIS — Z08 Encounter for follow-up examination after completed treatment for malignant neoplasm: Secondary | ICD-10-CM | POA: Diagnosis present

## 2015-02-10 DIAGNOSIS — R599 Enlarged lymph nodes, unspecified: Secondary | ICD-10-CM | POA: Diagnosis not present

## 2015-02-10 DIAGNOSIS — C7800 Secondary malignant neoplasm of unspecified lung: Secondary | ICD-10-CM | POA: Diagnosis not present

## 2015-02-10 DIAGNOSIS — C180 Malignant neoplasm of cecum: Secondary | ICD-10-CM | POA: Insufficient documentation

## 2015-02-10 DIAGNOSIS — Z9049 Acquired absence of other specified parts of digestive tract: Secondary | ICD-10-CM | POA: Insufficient documentation

## 2015-02-10 MED ORDER — IOHEXOL 300 MG/ML  SOLN
100.0000 mL | Freq: Once | INTRAMUSCULAR | Status: AC | PRN
  Administered 2015-02-10: 100 mL via INTRAVENOUS

## 2015-02-12 ENCOUNTER — Telehealth: Payer: Self-pay | Admitting: *Deleted

## 2015-02-12 NOTE — Telephone Encounter (Signed)
Called pt with CT results. Lung and liver lesions are better, per Dr. Benay Spice. She voiced understanding, stated she viewed result online. Has several questions about the scan and looks forward to having it interpreted face to face "in layman's terms." Pt wants to know if this is typical response to FOLFOX. Concerned that the lung lesions did not shrink as much as liver lesions. She also stated several times she thinks there's "something different going on" in her lungs. Encouraged her to write down her questions to review with MD. She agreed to do so.

## 2015-02-12 NOTE — Telephone Encounter (Signed)
-----   Message from Ladell Pier, MD sent at 02/11/2015  9:12 PM EDT ----- Please call patient, lung and liver lesions are better, f/u as scheduled

## 2015-02-16 ENCOUNTER — Other Ambulatory Visit (HOSPITAL_BASED_OUTPATIENT_CLINIC_OR_DEPARTMENT_OTHER): Payer: 59

## 2015-02-16 ENCOUNTER — Telehealth: Payer: Self-pay | Admitting: Oncology

## 2015-02-16 ENCOUNTER — Ambulatory Visit (HOSPITAL_BASED_OUTPATIENT_CLINIC_OR_DEPARTMENT_OTHER): Payer: 59 | Admitting: Oncology

## 2015-02-16 ENCOUNTER — Ambulatory Visit (HOSPITAL_BASED_OUTPATIENT_CLINIC_OR_DEPARTMENT_OTHER): Payer: 59

## 2015-02-16 ENCOUNTER — Telehealth: Payer: Self-pay | Admitting: *Deleted

## 2015-02-16 ENCOUNTER — Ambulatory Visit: Payer: 59 | Admitting: Nutrition

## 2015-02-16 VITALS — BP 134/82 | HR 72 | Temp 97.5°F | Resp 18 | Ht 66.0 in | Wt 150.3 lb

## 2015-02-16 VITALS — BP 119/72 | HR 60

## 2015-02-16 DIAGNOSIS — C180 Malignant neoplasm of cecum: Secondary | ICD-10-CM

## 2015-02-16 DIAGNOSIS — C7802 Secondary malignant neoplasm of left lung: Secondary | ICD-10-CM

## 2015-02-16 DIAGNOSIS — C182 Malignant neoplasm of ascending colon: Secondary | ICD-10-CM

## 2015-02-16 DIAGNOSIS — I808 Phlebitis and thrombophlebitis of other sites: Secondary | ICD-10-CM

## 2015-02-16 DIAGNOSIS — C787 Secondary malignant neoplasm of liver and intrahepatic bile duct: Secondary | ICD-10-CM

## 2015-02-16 DIAGNOSIS — Z5111 Encounter for antineoplastic chemotherapy: Secondary | ICD-10-CM | POA: Diagnosis not present

## 2015-02-16 DIAGNOSIS — C772 Secondary and unspecified malignant neoplasm of intra-abdominal lymph nodes: Secondary | ICD-10-CM

## 2015-02-16 DIAGNOSIS — Z5112 Encounter for antineoplastic immunotherapy: Secondary | ICD-10-CM | POA: Diagnosis not present

## 2015-02-16 DIAGNOSIS — C7801 Secondary malignant neoplasm of right lung: Secondary | ICD-10-CM

## 2015-02-16 DIAGNOSIS — Z800 Family history of malignant neoplasm of digestive organs: Secondary | ICD-10-CM

## 2015-02-16 LAB — CBC WITH DIFFERENTIAL/PLATELET
BASO%: 0.7 % (ref 0.0–2.0)
Basophils Absolute: 0 10*3/uL (ref 0.0–0.1)
EOS ABS: 0.3 10*3/uL (ref 0.0–0.5)
EOS%: 6.7 % (ref 0.0–7.0)
HCT: 36.5 % (ref 34.8–46.6)
HEMOGLOBIN: 12.5 g/dL (ref 11.6–15.9)
LYMPH#: 1.3 10*3/uL (ref 0.9–3.3)
LYMPH%: 32.7 % (ref 14.0–49.7)
MCH: 30.3 pg (ref 25.1–34.0)
MCHC: 34.2 g/dL (ref 31.5–36.0)
MCV: 88.6 fL (ref 79.5–101.0)
MONO#: 0.8 10*3/uL (ref 0.1–0.9)
MONO%: 20.7 % — ABNORMAL HIGH (ref 0.0–14.0)
NEUT#: 1.6 10*3/uL (ref 1.5–6.5)
NEUT%: 39.2 % (ref 38.4–76.8)
PLATELETS: 166 10*3/uL (ref 145–400)
RBC: 4.12 10*6/uL (ref 3.70–5.45)
RDW: 18.8 % — AB (ref 11.2–14.5)
WBC: 4 10*3/uL (ref 3.9–10.3)

## 2015-02-16 LAB — COMPREHENSIVE METABOLIC PANEL (CC13)
ALK PHOS: 84 U/L (ref 40–150)
ALT: 35 U/L (ref 0–55)
ANION GAP: 8 meq/L (ref 3–11)
AST: 35 U/L — ABNORMAL HIGH (ref 5–34)
Albumin: 3.7 g/dL (ref 3.5–5.0)
BILIRUBIN TOTAL: 0.44 mg/dL (ref 0.20–1.20)
BUN: 12.9 mg/dL (ref 7.0–26.0)
CALCIUM: 9.8 mg/dL (ref 8.4–10.4)
CHLORIDE: 106 meq/L (ref 98–109)
CO2: 24 mEq/L (ref 22–29)
CREATININE: 0.8 mg/dL (ref 0.6–1.1)
EGFR: 82 mL/min/{1.73_m2} — AB (ref >90)
Glucose: 95 mg/dl (ref 70–140)
Potassium: 4.1 mEq/L (ref 3.5–5.1)
Sodium: 138 mEq/L (ref 136–145)
TOTAL PROTEIN: 6.8 g/dL (ref 6.4–8.3)

## 2015-02-16 LAB — UA PROTEIN, DIPSTICK - CHCC: Protein, ur: NEGATIVE mg/dL

## 2015-02-16 MED ORDER — LEUCOVORIN CALCIUM INJECTION 350 MG
400.0000 mg/m2 | Freq: Once | INTRAVENOUS | Status: AC
  Administered 2015-02-16: 688 mg via INTRAVENOUS
  Filled 2015-02-16: qty 34.4

## 2015-02-16 MED ORDER — SODIUM CHLORIDE 0.9 % IV SOLN
Freq: Once | INTRAVENOUS | Status: AC
  Administered 2015-02-16: 13:00:00 via INTRAVENOUS
  Filled 2015-02-16: qty 5

## 2015-02-16 MED ORDER — OXALIPLATIN CHEMO INJECTION 100 MG/20ML
85.0000 mg/m2 | Freq: Once | INTRAVENOUS | Status: AC
  Administered 2015-02-16: 145 mg via INTRAVENOUS
  Filled 2015-02-16: qty 29

## 2015-02-16 MED ORDER — SODIUM CHLORIDE 0.9 % IV SOLN
Freq: Once | INTRAVENOUS | Status: AC
  Administered 2015-02-16: 13:00:00 via INTRAVENOUS

## 2015-02-16 MED ORDER — DEXTROSE 5 % IV SOLN
Freq: Once | INTRAVENOUS | Status: AC
  Administered 2015-02-16: 14:00:00 via INTRAVENOUS

## 2015-02-16 MED ORDER — SODIUM CHLORIDE 0.9 % IV SOLN
1800.0000 mg/m2 | INTRAVENOUS | Status: DC
  Administered 2015-02-16: 3100 mg via INTRAVENOUS
  Filled 2015-02-16: qty 62

## 2015-02-16 MED ORDER — SODIUM CHLORIDE 0.9 % IV SOLN
5.0000 mg/kg | Freq: Once | INTRAVENOUS | Status: AC
  Administered 2015-02-16: 325 mg via INTRAVENOUS
  Filled 2015-02-16: qty 13

## 2015-02-16 MED ORDER — PALONOSETRON HCL INJECTION 0.25 MG/5ML
INTRAVENOUS | Status: AC
  Filled 2015-02-16: qty 5

## 2015-02-16 MED ORDER — PALONOSETRON HCL INJECTION 0.25 MG/5ML
0.2500 mg | Freq: Once | INTRAVENOUS | Status: AC
  Administered 2015-02-16: 0.25 mg via INTRAVENOUS

## 2015-02-16 NOTE — Telephone Encounter (Signed)
Pt confirmed labs/ov per 06/07 POF, gave pt AVS and Calendar.... KJ, sent msg to add chemo

## 2015-02-16 NOTE — Progress Notes (Signed)
Nutrition follow-up completed.  Patient receiving chemotherapy for stage IV colon cancer. Patient reports she is eating well and has a good appetite. Weight increased and was documented as 150.3 pounds on June 7, up from 144 pounds April 20. Patient denies nutrition impact symptoms. She does report it is difficult to drink room temperature liquids directly after chemotherapy because she prefers cold liquids.  Nutrition diagnosis: Food and nutrition related knowledge deficit improved.  Intervention: Educated patient on strategies for increasing hydration. Encouraged patient to continue meals and snacks with adequate calories and protein. Teach back method was used.  Monitoring, evaluation, goals: Patient will tolerate adequate calories and protein to minimize weight loss.  Next visit: To be scheduled as needed.

## 2015-02-16 NOTE — Patient Instructions (Addendum)
  [facility] Discharge Instructions for Patients Receiving Chemotherapy  Today you received the following chemotherapy agents: Avastin, Oxaliplatin, Leucovorin, and Adrucil.   To help prevent nausea and vomiting after your treatment, we encourage you to take your nausea medication as directed.    If you develop nausea and vomiting that is not controlled by your nausea medication, call the clinic.   BELOW ARE SYMPTOMS THAT SHOULD BE REPORTED IMMEDIATELY:  *FEVER GREATER THAN 100.5 F  *CHILLS WITH OR WITHOUT FEVER  NAUSEA AND VOMITING THAT IS NOT CONTROLLED WITH YOUR NAUSEA MEDICATION  *UNUSUAL SHORTNESS OF BREATH  *UNUSUAL BRUISING OR BLEEDING  TENDERNESS IN MOUTH AND THROAT WITH OR WITHOUT PRESENCE OF ULCERS  *URINARY PROBLEMS  *BOWEL PROBLEMS  UNUSUAL RASH Items with * indicate a potential emergency and should be followed up as soon as possible.  Feel free to call the clinic you have any questions or concerns. The clinic phone number is [phone].  Please show the [place] at check-in to the Emergency Department and triage nurse.

## 2015-02-16 NOTE — [facility] Oncology Navigator Note (Signed)
Oncology Nurse Navigator Documentation  Oncology Nurse Navigator Flowsheets 02/16/2015  Navigator Encounter Type Treatment-3 month F/U  Patient Visit Type Medonc  Treatment Phase Treatment-FOLFOX #7  Barriers/Navigation Needs Family concerns;Financial--allowed her to vent her worries and anxiety. Instructed to focus on something to do for herself when she completes treatment. Take one day at a time-worry is useless. Provided with application for financial assistance from [place].  Time Spent with Patient 30

## 2015-02-16 NOTE — Progress Notes (Signed)
  [facility] OFFICE PROGRESS NOTE   Diagnosis: Colon cancer  INTERVAL HISTORY:   She completed another cycle of FOLFOX/Avastin 01/13/2015. Mild nausea. She reports feeling emotional for 3-4 days following chemotherapy, though this has improved. Cold sensitivity lasted for 7 days after chemotherapy. No peripheral numbness. She has noted mild tingling at the tip of the tongue. No mouth sores or diarrhea. Good appetite. No bleeding.  Objective:  Vital signs in last 24 hours:  Blood pressure 134/82, pulse 72, temperature 97.5 F (36.4 C), temperature source Oral, resp. rate 18, height $RemoveBe'5\' 6"'[id number]$  (1.676 m), weight 150 lb 4.8 oz (68.176 kg), SpO2 100 %.    HEENT: No thrush or ulcers Resp: Lungs clear bilaterally Cardio: Regular rate and rhythm GI: No hepatomegaly, nontender Vascular: No leg edema Neuro: The vibratory sense is intact at the fingertip bilaterally  Skin: Mild hyperpigmentation of the hands   Portacath/PICC-without erythema  Lab Results:  Lab Results  Component Value Date   WBC 4.0 02/16/2015   HGB 12.5 02/16/2015   HCT 36.5 02/16/2015   MCV 88.6 02/16/2015   PLT 166 02/16/2015   NEUTROABS 1.6 02/16/2015      Lab Results  Component Value Date   CEA 2.8 12/30/2014    Medications: I have reviewed the patient's current medications.  Assessment/Plan: 1. 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, status post a right colectomy 08/31/2014  no loss of mismatch repair protein expression, microsatellite stable  K-ras wild-type by standard testing  Staging CT scans December 2015 and PET scan 09/22/2014 consistent with metastatic bilateral lung nodules, metastatic retroperitoneal lymphadenopathy, and a probable left liver metastasis  Cycle 1 CAPOX 10/29/2014 (Xeloda discontinued day 9 secondary to diarrhea)   CT 11/14/2014 revealed progressive metastatic disease at the lung bases and liver  compared to a PET scan from 09/22/2014, mild improvement in retroperitoneal lymphadenopathy  Cycle 1 FOLFOX 12/02/2014  Cycle 2 FOLFOX, Avastin added 12/16/2014  Cycle 3 FOLFOX/Avastin 12/30/2014  Cycle 4 FOLFOX/Avastin 01/13/2015  Cycle 5 FOLFOX/Avastin 01/27/2015  CT 02/10/2015 with improvement in hepatic metastases, mild improvement of lung metastases and retroperitoneal lymphadenopathy  Cycle 6 FOLFOX/Avastin 02/16/2015 2. Elevated TSH, PET scan 09/22/2014 with diffuse increased metabolic uptake in the thyroid   3. Family history of multiple cancers including colon cancer in her paternal grandfather and rectal cancer in a paternal first cousin  44. Admission 11/14/2014 with nausea and diarrhea, most likely secondary to capecitabine induced enteritis-resolved. CT 11/14/2014 consistent with ileitis  5.Superficial phlebitis left hand/wrist 12/30/2014.   Disposition:  She has completed 5 cycles of FOLFOX/Avastin. The restaging CT reveals improvement in the metastatic tumor burden. I reviewed the CT images with her. I recommend continuing FOLFOX/Avastin with a restaging CT after 4 additional cycles. She agrees to proceed.  Betsy Coder, MD  02/16/2015  11:58 AM

## 2015-02-16 NOTE — Telephone Encounter (Signed)
Per staff message and POF I have scheduled appts. Advised scheduler of appts. JMW  

## 2015-02-17 ENCOUNTER — Telehealth: Payer: Self-pay | Admitting: *Deleted

## 2015-02-17 LAB — CEA: CEA: 2.4 ng/mL (ref 0.0–5.0)

## 2015-02-17 NOTE — Telephone Encounter (Signed)
"  After every chemotherapy treatment I have facial redness.  It last a day or two and goes away.  Is this a problem or concern?"  Informed her she receives dexamethasone steroid and this is a side effect.  Thanked me for explaining and no further questions.

## 2015-02-17 NOTE — Telephone Encounter (Signed)
Oncology Nurse Navigator Documentation  Oncology Nurse Navigator Flowsheets 02/16/2015 02/17/2015  Navigator Encounter Type Treatment Telephone call from [facility]  Patient Visit Type Medonc -  Treatment Phase Treatment Treatment  Barriers/Navigation Needs Family concerns;Financial Questions: Social interaction,talking w/md, coloring her hair  Time Spent with Patient 30 15  Asked nurse if she saw something in her that prompted me to ask if she was getting out and socializing. 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 Also inquires if she has questions for Dr. Benay Spice is it best to call him or wait till the appointment? Last, she reports her stylist does not know how to color hair without heat-she is asking if OK to try this? Made her aware, I only want to be sure she is "living" with her cancer and treatment and not just existing. Don't want her in home all day in bed or sofa and not getting out. 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 Told her unless it is urgent to write down all her questions and ask at MD visit. Could not tell her it is OK to dye her hair, but thought she should be OK since her chemo does not result in hair loss, but I could not guarantee. She understands and wants to color her hair anyway.

## 2015-02-18 ENCOUNTER — Ambulatory Visit (HOSPITAL_BASED_OUTPATIENT_CLINIC_OR_DEPARTMENT_OTHER): Payer: 59

## 2015-02-18 VITALS — BP 116/31 | HR 60 | Temp 98.3°F

## 2015-02-18 DIAGNOSIS — C787 Secondary malignant neoplasm of liver and intrahepatic bile duct: Secondary | ICD-10-CM

## 2015-02-18 DIAGNOSIS — C182 Malignant neoplasm of ascending colon: Secondary | ICD-10-CM | POA: Diagnosis not present

## 2015-02-18 DIAGNOSIS — C7802 Secondary malignant neoplasm of left lung: Secondary | ICD-10-CM

## 2015-02-18 DIAGNOSIS — C772 Secondary and unspecified malignant neoplasm of intra-abdominal lymph nodes: Secondary | ICD-10-CM

## 2015-02-18 DIAGNOSIS — C180 Malignant neoplasm of cecum: Secondary | ICD-10-CM

## 2015-02-18 DIAGNOSIS — C7801 Secondary malignant neoplasm of right lung: Secondary | ICD-10-CM | POA: Diagnosis not present

## 2015-02-18 MED ORDER — SODIUM CHLORIDE 0.9 % IJ SOLN
10.0000 mL | INTRAMUSCULAR | Status: DC | PRN
  Administered 2015-02-18: 10 mL
  Filled 2015-02-18: qty 10

## 2015-02-18 MED ORDER — HEPARIN SOD (PORK) LOCK FLUSH 100 UNIT/ML IV SOLN
500.0000 [IU] | Freq: Once | INTRAVENOUS | Status: AC | PRN
  Administered 2015-02-18: 500 [IU]
  Filled 2015-02-18: qty 5

## 2015-02-18 NOTE — Patient Instructions (Signed)
Fluorouracil, 5-FU injection What is this medicine? FLUOROURACIL, 5-FU (flure oh YOOR a sil) is a chemotherapy drug. It slows the growth of cancer cells. This medicine is used to treat many types of cancer like breast cancer, colon or rectal cancer, pancreatic cancer, and stomach cancer. This medicine may be used for other purposes; ask your health care provider or pharmacist if you have questions. COMMON BRAND NAME(S): Adrucil What should I tell my health care provider before I take this medicine? They need to know if you have any of these conditions: -blood disorders -dihydropyrimidine dehydrogenase (DPD) deficiency -infection (especially a virus infection such as chickenpox, cold sores, or herpes) -kidney disease -liver disease -malnourished, poor nutrition -recent or ongoing radiation therapy -an unusual or allergic reaction to fluorouracil, other chemotherapy, other medicines, foods, dyes, or preservatives -pregnant or trying to get pregnant -breast-feeding How should I use this medicine? This drug is given as an infusion or injection into a vein. It is administered in a hospital or clinic by a specially trained health care professional. Talk to your pediatrician regarding the use of this medicine in children. Special care may be needed. Overdosage: If you think you have taken too much of this medicine contact a poison control center or emergency room at once. NOTE: This medicine is only for you. Do not share this medicine with others. What if I miss a dose? It is important not to miss your dose. Call your doctor or health care professional if you are unable to keep an appointment. What may interact with this medicine? -allopurinol -cimetidine -dapsone -digoxin -hydroxyurea -leucovorin -levamisole -medicines for seizures like ethotoin, fosphenytoin, phenytoin -medicines to increase blood counts like filgrastim, pegfilgrastim, sargramostim -medicines that treat or prevent blood  clots like warfarin, enoxaparin, and dalteparin -methotrexate -metronidazole -pyrimethamine -some other chemotherapy drugs like busulfan, cisplatin, estramustine, vinblastine -trimethoprim -trimetrexate -vaccines Talk to your doctor or health care professional before taking any of these medicines: -acetaminophen -aspirin -ibuprofen -ketoprofen -naproxen This list may not describe all possible interactions. Give your health care provider a list of all the medicines, herbs, non-prescription drugs, or dietary supplements you use. Also tell them if you smoke, drink alcohol, or use illegal drugs. Some items may interact with your medicine. What should I watch for while using this medicine? Visit your doctor for checks on your progress. This drug may make you feel generally unwell. This is not uncommon, as chemotherapy can affect healthy cells as well as cancer cells. Report any side effects. Continue your course of treatment even though you feel ill unless your doctor tells you to stop. In some cases, you may be given additional medicines to help with side effects. Follow all directions for their use. Call your doctor or health care professional for advice if you get a fever, chills or sore throat, or other symptoms of a cold or flu. Do not treat yourself. This drug decreases your body's ability to fight infections. Try to avoid being around people who are sick. This medicine may increase your risk to bruise or bleed. Call your doctor or health care professional if you notice any unusual bleeding. Be careful brushing and flossing your teeth or using a toothpick because you may get an infection or bleed more easily. If you have any dental work done, tell your dentist you are receiving this medicine. Avoid taking products that contain aspirin, acetaminophen, ibuprofen, naproxen, or ketoprofen unless instructed by your doctor. These medicines may hide a fever. Do not become pregnant while taking this    medicine. Women should inform their doctor if they wish to become pregnant or think they might be pregnant. There is a potential for serious side effects to an unborn child. Talk to your health care professional or pharmacist for more information. Do not breast-feed an infant while taking this medicine. Men should inform their doctor if they wish to father a child. This medicine may lower sperm counts. Do not treat diarrhea with over the counter products. Contact your doctor if you have diarrhea that lasts more than 2 days or if it is severe and watery. This medicine can make you more sensitive to the sun. Keep out of the sun. If you cannot avoid being in the sun, wear protective clothing and use sunscreen. Do not use sun lamps or tanning beds/booths. What side effects may I notice from receiving this medicine? Side effects that you should report to your doctor or health care professional as soon as possible: -allergic reactions like skin rash, itching or hives, swelling of the face, lips, or tongue -low blood counts - this medicine may decrease the number of white blood cells, red blood cells and platelets. You may be at increased risk for infections and bleeding. -signs of infection - fever or chills, cough, sore throat, pain or difficulty passing urine -signs of decreased platelets or bleeding - bruising, pinpoint red spots on the skin, black, tarry stools, blood in the urine -signs of decreased red blood cells - unusually weak or tired, fainting spells, lightheadedness -breathing problems -changes in vision -chest pain -mouth sores -nausea and vomiting -pain, swelling, redness at site where injected -pain, tingling, numbness in the hands or feet -redness, swelling, or sores on hands or feet -stomach pain -unusual bleeding Side effects that usually do not require medical attention (report to your doctor or health care professional if they continue or are bothersome): -changes in finger or  toe nails -diarrhea -dry or itchy skin -hair loss -headache -loss of appetite -sensitivity of eyes to the light -stomach upset -unusually teary eyes This list may not describe all possible side effects. Call your doctor for medical advice about side effects. You may report side effects to FDA at 1-800-FDA-1088. Where should I keep my medicine? This drug is given in a hospital or clinic and will not be stored at home. NOTE: This sheet is a summary. It may not cover all possible information. If you have questions about this medicine, talk to your doctor, pharmacist, or health care provider.  2015, Elsevier/Gold Standard. (2008-01-01 13:53:16)   

## 2015-02-28 ENCOUNTER — Other Ambulatory Visit: Payer: Self-pay | Admitting: Oncology

## 2015-03-01 ENCOUNTER — Telehealth: Payer: Self-pay | Admitting: *Deleted

## 2015-03-01 NOTE — Telephone Encounter (Signed)
Martha Becker left VM asking what does she need to do to get her medical leave from work extended. She thinks it "runs out" on 03/11/15. She has a call into Leron Croak of her HR department. Asking if our office needs to initiate the process? She will be in office tomorrow for appointment.

## 2015-03-02 ENCOUNTER — Ambulatory Visit (HOSPITAL_BASED_OUTPATIENT_CLINIC_OR_DEPARTMENT_OTHER): Payer: 59 | Admitting: Oncology

## 2015-03-02 ENCOUNTER — Other Ambulatory Visit (HOSPITAL_BASED_OUTPATIENT_CLINIC_OR_DEPARTMENT_OTHER): Payer: 59

## 2015-03-02 ENCOUNTER — Ambulatory Visit (HOSPITAL_BASED_OUTPATIENT_CLINIC_OR_DEPARTMENT_OTHER): Payer: 59

## 2015-03-02 ENCOUNTER — Telehealth: Payer: Self-pay | Admitting: Oncology

## 2015-03-02 VITALS — BP 148/88 | HR 87 | Temp 98.3°F | Resp 18 | Ht 66.0 in | Wt 151.9 lb

## 2015-03-02 DIAGNOSIS — C180 Malignant neoplasm of cecum: Secondary | ICD-10-CM

## 2015-03-02 DIAGNOSIS — C7801 Secondary malignant neoplasm of right lung: Secondary | ICD-10-CM

## 2015-03-02 DIAGNOSIS — C7802 Secondary malignant neoplasm of left lung: Secondary | ICD-10-CM

## 2015-03-02 DIAGNOSIS — C772 Secondary and unspecified malignant neoplasm of intra-abdominal lymph nodes: Secondary | ICD-10-CM

## 2015-03-02 DIAGNOSIS — C182 Malignant neoplasm of ascending colon: Secondary | ICD-10-CM | POA: Diagnosis not present

## 2015-03-02 DIAGNOSIS — C787 Secondary malignant neoplasm of liver and intrahepatic bile duct: Secondary | ICD-10-CM

## 2015-03-02 DIAGNOSIS — Z5112 Encounter for antineoplastic immunotherapy: Secondary | ICD-10-CM

## 2015-03-02 DIAGNOSIS — G620 Drug-induced polyneuropathy: Secondary | ICD-10-CM

## 2015-03-02 LAB — COMPREHENSIVE METABOLIC PANEL (CC13)
ALT: 33 U/L (ref 0–55)
ANION GAP: 6 meq/L (ref 3–11)
AST: 30 U/L (ref 5–34)
Albumin: 3.9 g/dL (ref 3.5–5.0)
Alkaline Phosphatase: 81 U/L (ref 40–150)
BILIRUBIN TOTAL: 0.5 mg/dL (ref 0.20–1.20)
BUN: 8.5 mg/dL (ref 7.0–26.0)
CO2: 26 meq/L (ref 22–29)
CREATININE: 0.8 mg/dL (ref 0.6–1.1)
Calcium: 9.7 mg/dL (ref 8.4–10.4)
Chloride: 106 mEq/L (ref 98–109)
EGFR: 79 mL/min/{1.73_m2} — AB (ref >90)
GLUCOSE: 96 mg/dL (ref 70–140)
Potassium: 4 mEq/L (ref 3.5–5.1)
Sodium: 138 mEq/L (ref 136–145)
Total Protein: 6.7 g/dL (ref 6.4–8.3)

## 2015-03-02 LAB — CBC WITH DIFFERENTIAL/PLATELET
BASO%: 0.5 % (ref 0.0–2.0)
Basophils Absolute: 0 10*3/uL (ref 0.0–0.1)
EOS ABS: 0.3 10*3/uL (ref 0.0–0.5)
EOS%: 5.9 % (ref 0.0–7.0)
HCT: 36.3 % (ref 34.8–46.6)
HGB: 12.2 g/dL (ref 11.6–15.9)
LYMPH#: 1.1 10*3/uL (ref 0.9–3.3)
LYMPH%: 22.3 % (ref 14.0–49.7)
MCH: 30.5 pg (ref 25.1–34.0)
MCHC: 33.5 g/dL (ref 31.5–36.0)
MCV: 90.8 fL (ref 79.5–101.0)
MONO#: 0.6 10*3/uL (ref 0.1–0.9)
MONO%: 12.3 % (ref 0.0–14.0)
NEUT#: 2.8 10*3/uL (ref 1.5–6.5)
NEUT%: 59 % (ref 38.4–76.8)
Platelets: 112 10*3/uL — ABNORMAL LOW (ref 145–400)
RBC: 4 10*6/uL (ref 3.70–5.45)
RDW: 20.5 % — AB (ref 11.2–14.5)
WBC: 4.8 10*3/uL (ref 3.9–10.3)

## 2015-03-02 MED ORDER — FOSAPREPITANT DIMEGLUMINE INJECTION 150 MG
Freq: Once | INTRAVENOUS | Status: AC
  Administered 2015-03-02: 12:00:00 via INTRAVENOUS
  Filled 2015-03-02: qty 5

## 2015-03-02 MED ORDER — HEPARIN SOD (PORK) LOCK FLUSH 100 UNIT/ML IV SOLN
500.0000 [IU] | Freq: Once | INTRAVENOUS | Status: AC | PRN
  Filled 2015-03-02: qty 5

## 2015-03-02 MED ORDER — SODIUM CHLORIDE 0.9 % IV SOLN
5.0000 mg/kg | Freq: Once | INTRAVENOUS | Status: AC
  Administered 2015-03-02: 325 mg via INTRAVENOUS
  Filled 2015-03-02: qty 13

## 2015-03-02 MED ORDER — SODIUM CHLORIDE 0.9 % IV SOLN
1800.0000 mg/m2 | INTRAVENOUS | Status: AC
  Administered 2015-03-02: 3100 mg via INTRAVENOUS
  Filled 2015-03-02: qty 62

## 2015-03-02 MED ORDER — DEXTROSE 5 % IV SOLN
Freq: Once | INTRAVENOUS | Status: AC
  Administered 2015-03-02: 12:00:00 via INTRAVENOUS

## 2015-03-02 MED ORDER — LEUCOVORIN CALCIUM INJECTION 350 MG
400.0000 mg/m2 | Freq: Once | INTRAVENOUS | Status: AC
  Administered 2015-03-02: 688 mg via INTRAVENOUS
  Filled 2015-03-02: qty 34.4

## 2015-03-02 MED ORDER — PALONOSETRON HCL INJECTION 0.25 MG/5ML
0.2500 mg | Freq: Once | INTRAVENOUS | Status: AC
  Administered 2015-03-02: 0.25 mg via INTRAVENOUS

## 2015-03-02 MED ORDER — SODIUM CHLORIDE 0.9 % IJ SOLN
10.0000 mL | INTRAMUSCULAR | Status: DC | PRN
  Filled 2015-03-02: qty 10

## 2015-03-02 MED ORDER — OXALIPLATIN CHEMO INJECTION 100 MG/20ML
85.0000 mg/m2 | Freq: Once | INTRAVENOUS | Status: AC
  Administered 2015-03-02: 145 mg via INTRAVENOUS
  Filled 2015-03-02: qty 29

## 2015-03-02 MED ORDER — PALONOSETRON HCL INJECTION 0.25 MG/5ML
INTRAVENOUS | Status: AC
  Filled 2015-03-02: qty 5

## 2015-03-02 NOTE — Patient Instructions (Signed)
[facility] Discharge Instructions for Patients Receiving Chemotherapy  Today you received the following chemotherapy agents Avastin/Oxaliplatin/Leucovorin/5 FU To help prevent nausea and vomiting after your treatment, we encourage you to take your nausea medication as prescribed. If you develop nausea and vomiting that is not controlled by your nausea medication, call the clinic.   BELOW ARE SYMPTOMS THAT SHOULD BE REPORTED IMMEDIATELY:  *FEVER GREATER THAN 100.5 F  *CHILLS WITH OR WITHOUT FEVER  NAUSEA AND VOMITING THAT IS NOT CONTROLLED WITH YOUR NAUSEA MEDICATION  *UNUSUAL SHORTNESS OF BREATH  *UNUSUAL BRUISING OR BLEEDING  TENDERNESS IN MOUTH AND THROAT WITH OR WITHOUT PRESENCE OF ULCERS  *URINARY PROBLEMS  *BOWEL PROBLEMS  UNUSUAL RASH Items with * indicate a potential emergency and should be followed up as soon as possible.  Feel free to call the clinic you have any questions or concerns. The clinic phone number is [phone].  Please show the CHEMO ALERT CARD at check-in to the Emergency Department and triage nurse.   

## 2015-03-02 NOTE — Telephone Encounter (Signed)
Added 7.19 appt per pof...the patient will get new print out in chemo

## 2015-03-02 NOTE — [facility] Oncology Navigator Note (Signed)
Oncology Nurse Navigator Documentation  Oncology Nurse Navigator Flowsheets 03/02/2015  Navigator Encounter Type Treatment  Patient Visit Type Medonc  Treatment Phase Treatment #7  Barriers/Navigation Needs Financial-needs letter to HR staff member, Leron Croak with return to work projected date.  Time Spent with Patient 35  Again asking nurse for infection rate with PAC-made her aware that infection rate is less than 5%. Still very anxious about losing her insurance. Still declines to take steps to apply for Medicaid.

## 2015-03-02 NOTE — Progress Notes (Signed)
  [name] OFFICE PROGRESS NOTE   Diagnosis: Colon cancer  INTERVAL HISTORY:   Martha Becker completed another cycle of chemotherapy 02/16/2015. She reports cold sensitivity following chemotherapy. She now has mild numbness and tingling in the fingers. This does not interfere with activity. Malaise lasted 5 days following chemotherapy. Good appetite. No abdominal pain. No nausea or diarrhea. She reports mild discomfort in the substernal/subxiphoid region following this cycle of chemotherapy.  Objective:  Vital signs in last 24 hours:  Blood pressure 148/88, pulse 87, temperature 98.3 F (36.8 C), temperature source Oral, resp. rate 18, height _0  (1.676 m), weight 151 lb 14.4 oz (68.901 kg), SpO2 98 %.    HEENT: No thrush or ulcers Resp: Lungs clear bilaterally Cardio: Rate and rhythm GI: No hepatomegaly, nontender, no mass Vascular: No leg edema Neuro: The vibratory sense is intact in the fingertips bilaterally  Skin: Mild hyperpigmentation of the palms   Portacath/PICC-without erythema  Lab Results:  Lab Results  Component Value Date   WBC 4.8 03/02/2015   HGB 12.2 03/02/2015   HCT 36.3 03/02/2015   MCV 90.8 03/02/2015   PLT 112* 03/02/2015   NEUTROABS 2.8 03/02/2015      Lab Results  Component Value Date   CEA 2.4 02/16/2015    Medications: I have reviewed the patient's current medications.  Assessment/Plan: 1. 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, status post a right colectomy 08/31/2014  no loss of mismatch repair protein expression, microsatellite stable  K-ras wild-type by standard testing  Staging CT scans December 2015 and PET scan 09/22/2014 consistent with metastatic bilateral lung nodules, metastatic retroperitoneal lymphadenopathy, and a probable left liver metastasis  Cycle 1 CAPOX 10/29/2014 (Xeloda discontinued day 9 secondary to diarrhea)   CT 11/14/2014 revealed  progressive metastatic disease at the lung bases and liver compared to a PET scan from 09/22/2014, mild improvement in retroperitoneal lymphadenopathy  Cycle 1 FOLFOX 12/02/2014  Cycle 2 FOLFOX, Avastin added 12/16/2014  Cycle 3 FOLFOX/Avastin 12/30/2014  Cycle 4 FOLFOX/Avastin 01/13/2015  Cycle 5 FOLFOX/Avastin 01/27/2015  CT 02/10/2015 with improvement in hepatic metastases, mild improvement of lung metastases and retroperitoneal lymphadenopathy  Cycle 6 FOLFOX/Avastin 02/16/2015  Cycle 7 FOLFOX/Avastin 03/02/2015 2. Elevated TSH, PET scan 09/22/2014 with diffuse increased metabolic uptake in the thyroid   3. Family history of multiple cancers including colon cancer in her paternal grandfather and rectal cancer in a paternal first cousin  103. Admission 11/14/2014 with nausea and diarrhea, most likely secondary to capecitabine induced enteritis-resolved. CT 11/14/2014 consistent with ileitis  5.Superficial phlebitis left hand/wrist 12/30/2014.  6.     Early oxaliplatin neuropathy    Disposition:  Martha Becker appears to be tolerating the chemotherapy well. The plan is to proceed with another cycle of FOLFOX/Avastin today. I reviewed CT images with her today. She will complete 10 cycles of FOLFOX/Avastin prior to a restaging CT.  She will return for an office visit and the next cycle of chemotherapy on 03/16/2015.  Betsy Coder, MD  03/02/2015  11:54 AM

## 2015-03-02 NOTE — Progress Notes (Unsigned)
OK to treat with platelet count at 112 per Dr. Benay Spice.

## 2015-03-03 ENCOUNTER — Encounter: Payer: Self-pay | Admitting: *Deleted

## 2015-03-03 LAB — CEA: CEA: 2.2 ng/mL (ref 0.0–5.0)

## 2015-03-04 ENCOUNTER — Encounter: Payer: Self-pay | Admitting: *Deleted

## 2015-03-04 ENCOUNTER — Ambulatory Visit (HOSPITAL_BASED_OUTPATIENT_CLINIC_OR_DEPARTMENT_OTHER): Payer: 59

## 2015-03-04 VITALS — BP 110/72 | HR 78 | Temp 98.3°F

## 2015-03-04 DIAGNOSIS — C182 Malignant neoplasm of ascending colon: Secondary | ICD-10-CM

## 2015-03-04 DIAGNOSIS — R21 Rash and other nonspecific skin eruption: Secondary | ICD-10-CM | POA: Diagnosis not present

## 2015-03-04 DIAGNOSIS — C180 Malignant neoplasm of cecum: Secondary | ICD-10-CM

## 2015-03-04 MED ORDER — HEPARIN SOD (PORK) LOCK FLUSH 100 UNIT/ML IV SOLN
500.0000 [IU] | Freq: Once | INTRAVENOUS | Status: AC | PRN
  Administered 2015-03-04: 500 [IU]
  Filled 2015-03-04: qty 5

## 2015-03-04 MED ORDER — SODIUM CHLORIDE 0.9 % IJ SOLN
10.0000 mL | INTRAMUSCULAR | Status: DC | PRN
  Administered 2015-03-04: 10 mL
  Filled 2015-03-04: qty 10

## 2015-03-04 NOTE — Progress Notes (Signed)
[place] [facility]  Clinical Social Work was referred by patient for assessment of psychosocial needs due to ongoing financial issues.  Clinical Social Worker met with patient at [facility] in her office to offer support and assess for needs.  CSW and pt discussed at length resources available to assist with current financial concerns. Pt considering her future goals and financial picture and how it relates to her diagnosis.   Pt open to applying to [place] while still in active treatment. CSW educated pt again on options for medicaid and also how medicare works once she is eligible. Pt could possibly loose her insurance due to gap in employment. CSW educated pt to avoid gap in insurance if at all possible. Pt will consider just going to DSS and talking to MCD worker for further education. Pt educated that [place] is out of funds currently and about Duanne Limerick and others that may assist. Pt plans to tackle these options and revisit with CSW in near future. CSW contacted [employer] to determine when applications may be accepted again and will contact pt if they respond.   Clinical Social Work interventions: Set designer and referral  Loren Racer, [place] Worker [place]  [place] Phone: [phone] Fax: [phone]

## 2015-03-04 NOTE — Patient Instructions (Signed)
Fluorouracil, 5-FU injection What is this medicine? FLUOROURACIL, 5-FU (flure oh YOOR a sil) is a chemotherapy drug. It slows the growth of cancer cells. This medicine is used to treat many types of cancer like breast cancer, colon or rectal cancer, pancreatic cancer, and stomach cancer. This medicine may be used for other purposes; ask your health care provider or pharmacist if you have questions. COMMON BRAND NAME(S): Adrucil What should I tell my health care provider before I take this medicine? They need to know if you have any of these conditions: -blood disorders -dihydropyrimidine dehydrogenase (DPD) deficiency -infection (especially a virus infection such as chickenpox, cold sores, or herpes) -kidney disease -liver disease -malnourished, poor nutrition -recent or ongoing radiation therapy -an unusual or allergic reaction to fluorouracil, other chemotherapy, other medicines, foods, dyes, or preservatives -pregnant or trying to get pregnant -breast-feeding How should I use this medicine? This drug is given as an infusion or injection into a vein. It is administered in a hospital or clinic by a specially trained health care professional. Talk to your pediatrician regarding the use of this medicine in children. Special care may be needed. Overdosage: If you think you have taken too much of this medicine contact a poison control center or emergency room at once. NOTE: This medicine is only for you. Do not share this medicine with others. What if I miss a dose? It is important not to miss your dose. Call your doctor or health care professional if you are unable to keep an appointment. What may interact with this medicine? -allopurinol -cimetidine -dapsone -digoxin -hydroxyurea -leucovorin -levamisole -medicines for seizures like ethotoin, fosphenytoin, phenytoin -medicines to increase blood counts like filgrastim, pegfilgrastim, sargramostim -medicines that treat or prevent blood  clots like warfarin, enoxaparin, and dalteparin -methotrexate -metronidazole -pyrimethamine -some other chemotherapy drugs like busulfan, cisplatin, estramustine, vinblastine -trimethoprim -trimetrexate -vaccines Talk to your doctor or health care professional before taking any of these medicines: -acetaminophen -aspirin -ibuprofen -ketoprofen -naproxen This list may not describe all possible interactions. Give your health care provider a list of all the medicines, herbs, non-prescription drugs, or dietary supplements you use. Also tell them if you smoke, drink alcohol, or use illegal drugs. Some items may interact with your medicine. What should I watch for while using this medicine? Visit your doctor for checks on your progress. This drug may make you feel generally unwell. This is not uncommon, as chemotherapy can affect healthy cells as well as cancer cells. Report any side effects. Continue your course of treatment even though you feel ill unless your doctor tells you to stop. In some cases, you may be given additional medicines to help with side effects. Follow all directions for their use. Call your doctor or health care professional for advice if you get a fever, chills or sore throat, or other symptoms of a cold or flu. Do not treat yourself. This drug decreases your body's ability to fight infections. Try to avoid being around people who are sick. This medicine may increase your risk to bruise or bleed. Call your doctor or health care professional if you notice any unusual bleeding. Be careful brushing and flossing your teeth or using a toothpick because you may get an infection or bleed more easily. If you have any dental work done, tell your dentist you are receiving this medicine. Avoid taking products that contain aspirin, acetaminophen, ibuprofen, naproxen, or ketoprofen unless instructed by your doctor. These medicines may hide a fever. Do not become pregnant while taking this    medicine. Women should inform their doctor if they wish to become pregnant or think they might be pregnant. There is a potential for serious side effects to an unborn child. Talk to your health care professional or pharmacist for more information. Do not breast-feed an infant while taking this medicine. Men should inform their doctor if they wish to father a child. This medicine may lower sperm counts. Do not treat diarrhea with over the counter products. Contact your doctor if you have diarrhea that lasts more than 2 days or if it is severe and watery. This medicine can make you more sensitive to the sun. Keep out of the sun. If you cannot avoid being in the sun, wear protective clothing and use sunscreen. Do not use sun lamps or tanning beds/booths. What side effects may I notice from receiving this medicine? Side effects that you should report to your doctor or health care professional as soon as possible: -allergic reactions like skin rash, itching or hives, swelling of the face, lips, or tongue -low blood counts - this medicine may decrease the number of white blood cells, red blood cells and platelets. You may be at increased risk for infections and bleeding. -signs of infection - fever or chills, cough, sore throat, pain or difficulty passing urine -signs of decreased platelets or bleeding - bruising, pinpoint red spots on the skin, black, tarry stools, blood in the urine -signs of decreased red blood cells - unusually weak or tired, fainting spells, lightheadedness -breathing problems -changes in vision -chest pain -mouth sores -nausea and vomiting -pain, swelling, redness at site where injected -pain, tingling, numbness in the hands or feet -redness, swelling, or sores on hands or feet -stomach pain -unusual bleeding Side effects that usually do not require medical attention (report to your doctor or health care professional if they continue or are bothersome): -changes in finger or  toe nails -diarrhea -dry or itchy skin -hair loss -headache -loss of appetite -sensitivity of eyes to the light -stomach upset -unusually teary eyes This list may not describe all possible side effects. Call your doctor for medical advice about side effects. You may report side effects to FDA at 1-800-FDA-1088. Where should I keep my medicine? This drug is given in a hospital or clinic and will not be stored at home. NOTE: This sheet is a summary. It may not cover all possible information. If you have questions about this medicine, talk to your doctor, pharmacist, or health care provider.  2015, Elsevier/Gold Standard. (2008-01-01 13:53:16)   

## 2015-03-04 NOTE — Progress Notes (Signed)
Pt in for pump DC states that she has a small rash on her right forearm.. Area red and warm to the touch.  Amy Horton, RN notified and inspected area to pt arm.  Dr Benay Spice made aware pt told to just monitor area and call the cancer center if area becomes worse.  Pt verbalizes understanding.

## 2015-03-15 ENCOUNTER — Other Ambulatory Visit: Payer: Self-pay | Admitting: Oncology

## 2015-03-16 ENCOUNTER — Other Ambulatory Visit (HOSPITAL_BASED_OUTPATIENT_CLINIC_OR_DEPARTMENT_OTHER): Payer: 59

## 2015-03-16 ENCOUNTER — Ambulatory Visit (HOSPITAL_BASED_OUTPATIENT_CLINIC_OR_DEPARTMENT_OTHER): Payer: 59

## 2015-03-16 ENCOUNTER — Ambulatory Visit (HOSPITAL_BASED_OUTPATIENT_CLINIC_OR_DEPARTMENT_OTHER): Payer: 59 | Admitting: Nurse Practitioner

## 2015-03-16 VITALS — BP 134/85 | HR 73 | Temp 98.1°F | Resp 20 | Wt 151.5 lb

## 2015-03-16 DIAGNOSIS — C180 Malignant neoplasm of cecum: Secondary | ICD-10-CM

## 2015-03-16 DIAGNOSIS — Z5111 Encounter for antineoplastic chemotherapy: Secondary | ICD-10-CM

## 2015-03-16 DIAGNOSIS — C7801 Secondary malignant neoplasm of right lung: Secondary | ICD-10-CM | POA: Diagnosis not present

## 2015-03-16 DIAGNOSIS — C787 Secondary malignant neoplasm of liver and intrahepatic bile duct: Secondary | ICD-10-CM

## 2015-03-16 DIAGNOSIS — C182 Malignant neoplasm of ascending colon: Secondary | ICD-10-CM

## 2015-03-16 DIAGNOSIS — Z5112 Encounter for antineoplastic immunotherapy: Secondary | ICD-10-CM

## 2015-03-16 DIAGNOSIS — C7802 Secondary malignant neoplasm of left lung: Secondary | ICD-10-CM | POA: Diagnosis not present

## 2015-03-16 DIAGNOSIS — C772 Secondary and unspecified malignant neoplasm of intra-abdominal lymph nodes: Secondary | ICD-10-CM

## 2015-03-16 DIAGNOSIS — G620 Drug-induced polyneuropathy: Secondary | ICD-10-CM

## 2015-03-16 LAB — COMPREHENSIVE METABOLIC PANEL (CC13)
ALT: 38 U/L (ref 0–55)
ANION GAP: 9 meq/L (ref 3–11)
AST: 36 U/L — ABNORMAL HIGH (ref 5–34)
Albumin: 3.8 g/dL (ref 3.5–5.0)
Alkaline Phosphatase: 82 U/L (ref 40–150)
BILIRUBIN TOTAL: 0.5 mg/dL (ref 0.20–1.20)
BUN: 9.3 mg/dL (ref 7.0–26.0)
CO2: 23 mEq/L (ref 22–29)
Calcium: 9.7 mg/dL (ref 8.4–10.4)
Chloride: 106 mEq/L (ref 98–109)
Creatinine: 0.8 mg/dL (ref 0.6–1.1)
EGFR: 79 mL/min/{1.73_m2} — ABNORMAL LOW (ref >90)
Glucose: 99 mg/dl (ref 70–140)
Potassium: 3.8 mEq/L (ref 3.5–5.1)
Sodium: 138 mEq/L (ref 136–145)
TOTAL PROTEIN: 6.6 g/dL (ref 6.4–8.3)

## 2015-03-16 LAB — CBC WITH DIFFERENTIAL/PLATELET
BASO%: 0.2 % (ref 0.0–2.0)
BASOS ABS: 0 10*3/uL (ref 0.0–0.1)
EOS%: 4 % (ref 0.0–7.0)
Eosinophils Absolute: 0.2 10*3/uL (ref 0.0–0.5)
HCT: 34.4 % — ABNORMAL LOW (ref 34.8–46.6)
HGB: 12 g/dL (ref 11.6–15.9)
LYMPH#: 1.3 10*3/uL (ref 0.9–3.3)
LYMPH%: 31.9 % (ref 14.0–49.7)
MCH: 31.4 pg (ref 25.1–34.0)
MCHC: 34.9 g/dL (ref 31.5–36.0)
MCV: 90.1 fL (ref 79.5–101.0)
MONO#: 0.7 10*3/uL (ref 0.1–0.9)
MONO%: 18.1 % — ABNORMAL HIGH (ref 0.0–14.0)
NEUT#: 1.9 10*3/uL (ref 1.5–6.5)
NEUT%: 45.8 % (ref 38.4–76.8)
Platelets: 86 10*3/uL — ABNORMAL LOW (ref 145–400)
RBC: 3.82 10*6/uL (ref 3.70–5.45)
RDW: 17.5 % — AB (ref 11.2–14.5)
WBC: 4 10*3/uL (ref 3.9–10.3)

## 2015-03-16 LAB — UA PROTEIN, DIPSTICK - CHCC: PROTEIN: NEGATIVE mg/dL

## 2015-03-16 MED ORDER — PALONOSETRON HCL INJECTION 0.25 MG/5ML
INTRAVENOUS | Status: AC
  Filled 2015-03-16: qty 5

## 2015-03-16 MED ORDER — SODIUM CHLORIDE 0.9 % IV SOLN
5.0000 mg/kg | Freq: Once | INTRAVENOUS | Status: AC
  Administered 2015-03-16: 325 mg via INTRAVENOUS
  Filled 2015-03-16: qty 13

## 2015-03-16 MED ORDER — OXALIPLATIN CHEMO INJECTION 100 MG/20ML
85.0000 mg/m2 | Freq: Once | INTRAVENOUS | Status: AC
  Administered 2015-03-16: 145 mg via INTRAVENOUS
  Filled 2015-03-16: qty 29

## 2015-03-16 MED ORDER — DEXTROSE 5 % IV SOLN
Freq: Once | INTRAVENOUS | Status: AC
  Administered 2015-03-16: 12:00:00 via INTRAVENOUS

## 2015-03-16 MED ORDER — FOSAPREPITANT DIMEGLUMINE INJECTION 150 MG
Freq: Once | INTRAVENOUS | Status: AC
  Administered 2015-03-16: 12:00:00 via INTRAVENOUS
  Filled 2015-03-16: qty 5

## 2015-03-16 MED ORDER — LEUCOVORIN CALCIUM INJECTION 350 MG
400.0000 mg/m2 | Freq: Once | INTRAVENOUS | Status: AC
  Administered 2015-03-16: 688 mg via INTRAVENOUS
  Filled 2015-03-16: qty 34.4

## 2015-03-16 MED ORDER — SODIUM CHLORIDE 0.9 % IV SOLN
1800.0000 mg/m2 | INTRAVENOUS | Status: DC
  Administered 2015-03-16: 3100 mg via INTRAVENOUS
  Filled 2015-03-16: qty 62

## 2015-03-16 MED ORDER — PALONOSETRON HCL INJECTION 0.25 MG/5ML
0.2500 mg | Freq: Once | INTRAVENOUS | Status: AC
  Administered 2015-03-16: 0.25 mg via INTRAVENOUS

## 2015-03-16 NOTE — Progress Notes (Signed)
Ok to treat with platelets of 86 per Ned Card, NP

## 2015-03-16 NOTE — Patient Instructions (Signed)
[facility] Discharge Instructions for Patients Receiving Chemotherapy  Today you received the following chemotherapy agents oxaliplatin/leucovorin/fluorouracil/avastin.   To help prevent nausea and vomiting after your treatment, we encourage you to take your nausea medication as directed.    If you develop nausea and vomiting that is not controlled by your nausea medication, call the clinic.   BELOW ARE SYMPTOMS THAT SHOULD BE REPORTED IMMEDIATELY:  *FEVER GREATER THAN 100.5 F  *CHILLS WITH OR WITHOUT FEVER  NAUSEA AND VOMITING THAT IS NOT CONTROLLED WITH YOUR NAUSEA MEDICATION  *UNUSUAL SHORTNESS OF BREATH  *UNUSUAL BRUISING OR BLEEDING  TENDERNESS IN MOUTH AND THROAT WITH OR WITHOUT PRESENCE OF ULCERS  *URINARY PROBLEMS  *BOWEL PROBLEMS  UNUSUAL RASH Items with * indicate a potential emergency and should be followed up as soon as possible.  Feel free to call the clinic you have any questions or concerns. The clinic phone number is [phone].  

## 2015-03-16 NOTE — Progress Notes (Addendum)
[facility] OFFICE PROGRESS NOTE   Diagnosis:  Colon cancer  INTERVAL HISTORY:   Martha Becker returns as scheduled. She completed cycle 7 FOLFOX/Avastin 03/02/2015. No nausea. She had loose stools 2-3 times a day. She took Imodium as needed and adjusted her diet. She intermittently notes "burning and itching" with bowel movements. No mouth sores. She notes progressive fatigue. She has intermittent tingling in the feet. Mild numbness in the fingertips. She denies any bleeding. No shortness of breath or chest pain. No calf pain. She occasionally notes mild swelling of the lower legs with prolonged standing.  Objective:  Vital signs in last 24 hours:  Blood pressure 134/85, pulse 73, temperature 98.1 F (36.7 C), temperature source Oral, resp. rate 20, weight 151 lb 8 oz (68.72 kg), SpO2 99 %.    HEENT: No thrush or ulcers. Resp: Lungs clear bilaterally. Cardio: Regular rate and rhythm. GI: Abdomen soft and nontender. No hepatomegaly. Vascular: No leg edema. Calves soft and nontender. Neuro: Vibratory sense intact over the finger tips per tuning fork exam.  Skin: No rash. Port-A-Cath without erythema.    Lab Results:  Lab Results  Component Value Date   WBC 4.0 03/16/2015   HGB 12.0 03/16/2015   HCT 34.4* 03/16/2015   MCV 90.1 03/16/2015   PLT 86* 03/16/2015   NEUTROABS 1.9 03/16/2015    Imaging:  No results found.  Medications: I have reviewed the patient's current medications.  Assessment/Plan: 1. 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, status post a right colectomy 08/31/2014  no loss of mismatch repair protein expression, microsatellite stable  K-ras wild-type by standard testing  Staging CT scans December 2015 and PET scan 09/22/2014 consistent with metastatic bilateral lung nodules, metastatic retroperitoneal lymphadenopathy, and a probable left liver metastasis  Cycle 1 CAPOX 10/29/2014  (Xeloda discontinued day 9 secondary to diarrhea)   CT 11/14/2014 revealed progressive metastatic disease at the lung bases and liver compared to a PET scan from 09/22/2014, mild improvement in retroperitoneal lymphadenopathy  Cycle 1 FOLFOX 12/02/2014  Cycle 2 FOLFOX, Avastin added 12/16/2014  Cycle 3 FOLFOX/Avastin 12/30/2014  Cycle 4 FOLFOX/Avastin 01/13/2015  Cycle 5 FOLFOX/Avastin 01/27/2015  CT 02/10/2015 with improvement in hepatic metastases, mild improvement of lung metastases and retroperitoneal lymphadenopathy  Cycle 6 FOLFOX/Avastin 02/16/2015  Cycle 7 FOLFOX/Avastin 03/02/2015  Cycle 8 FOLFOX/Avastin 03/16/2015 2. Elevated TSH, PET scan 09/22/2014 with diffuse increased metabolic uptake in the thyroid   3. Family history of multiple cancers including colon cancer in her paternal grandfather and rectal cancer in a paternal first cousin  31. Admission 11/14/2014 with nausea and diarrhea, most likely secondary to capecitabine induced enteritis-resolved. CT 11/14/2014 consistent with ileitis  5.Superficial phlebitis left hand/wrist 12/30/2014.  6. Early oxaliplatin neuropathy    Disposition: Martha Becker appears stable. She has completed 7 cycles of FOLFOX. Plan to proceed with cycle 8 today as scheduled.   She has thrombocytopenia on labs today. She understands to contact the office with any bleeding.  She will return for a follow-up visit and cycle 9 FOLFOX in 2 weeks. She will contact the office in the interim as outlined above or with any other problems.  Patient seen with Dr. Benay Spice.     Ned Card ANP/GNP-BC   03/16/2015  11:44 AM  This was a shared visit with Ned Card. The plan is to proceed with FOLFOX chemotherapy. She has mild thrombocytopenia. She will contact us for bleeding or bruising. She will undergo a restaging evaluation  after 2 more cycles of chemotherapy.  [name], M.D.

## 2015-03-17 ENCOUNTER — Telehealth: Payer: Self-pay | Admitting: *Deleted

## 2015-03-17 NOTE — Telephone Encounter (Signed)
Oncology Nurse Navigator Documentation  Oncology Nurse Navigator Flowsheets 03/17/2015  Navigator Encounter Type Telephone-returned patient's call  Patient Visit Type -  Treatment Phase Treatment-s/p FOLFOX #8/9  Barriers/Navigation Needs Family concerns-finances, uncertainity of her life, lack of quality of life  Support Groups/Services GI-encouraged to come to group in August, suggested she talk with a patient who is currently in maintenance therapy.   Time Spent with Patient 72  Requested nurse tell Dr. Benay Spice that she apologizes for being "short" with him at last visit. Verbalizes how high her anxiety and stress level is. Dwells on asking for absolutes in how she will feel and how long she will live with or without continued treatment. Worried about spending all her savings and her credit being destroyed. Frequently expresses frustration that her life is not going as she has planned it. Very resistant to apply for Medicaid-feels if all she has is her home and one car, that she is poor. Feels that having no energy and lying on sofa for 4-5 days after each tx is poor quality of life (not in pain or n/v-just fatigued). Allowed her to vent feelings and cry. Attempted to get her to focus on what is before her today and not burden herself with things that she can't control-concentrate on what she can control. Look for ways to get her mind off negative feelings. She declines professional counseling or antidepressant. Ended conversation with asking nurse to apololgize to MD for her. Thanked nurse for her attention.

## 2015-03-18 ENCOUNTER — Ambulatory Visit (HOSPITAL_BASED_OUTPATIENT_CLINIC_OR_DEPARTMENT_OTHER): Payer: 59

## 2015-03-18 VITALS — BP 133/89 | HR 92 | Temp 98.0°F | Resp 18

## 2015-03-18 DIAGNOSIS — C182 Malignant neoplasm of ascending colon: Secondary | ICD-10-CM

## 2015-03-18 DIAGNOSIS — C180 Malignant neoplasm of cecum: Secondary | ICD-10-CM

## 2015-03-18 MED ORDER — SODIUM CHLORIDE 0.9 % IJ SOLN
10.0000 mL | INTRAMUSCULAR | Status: DC | PRN
  Administered 2015-03-18: 10 mL
  Filled 2015-03-18: qty 10

## 2015-03-18 MED ORDER — HEPARIN SOD (PORK) LOCK FLUSH 100 UNIT/ML IV SOLN
500.0000 [IU] | Freq: Once | INTRAVENOUS | Status: AC | PRN
  Administered 2015-03-18: 500 [IU]
  Filled 2015-03-18: qty 5

## 2015-03-18 NOTE — Progress Notes (Signed)
Per Dr Benay Spice he will have Merceda Elks, RN give the pt a call to discuss pt concerns.

## 2015-03-27 ENCOUNTER — Other Ambulatory Visit: Payer: Self-pay | Admitting: Oncology

## 2015-03-30 ENCOUNTER — Other Ambulatory Visit (HOSPITAL_BASED_OUTPATIENT_CLINIC_OR_DEPARTMENT_OTHER): Payer: 59

## 2015-03-30 ENCOUNTER — Ambulatory Visit (HOSPITAL_BASED_OUTPATIENT_CLINIC_OR_DEPARTMENT_OTHER): Payer: 59 | Admitting: Nurse Practitioner

## 2015-03-30 ENCOUNTER — Ambulatory Visit: Payer: 59

## 2015-03-30 VITALS — BP 146/84 | HR 80 | Temp 98.5°F | Resp 18 | Ht 66.0 in | Wt 150.6 lb

## 2015-03-30 DIAGNOSIS — C7802 Secondary malignant neoplasm of left lung: Secondary | ICD-10-CM | POA: Diagnosis not present

## 2015-03-30 DIAGNOSIS — C182 Malignant neoplasm of ascending colon: Secondary | ICD-10-CM | POA: Diagnosis not present

## 2015-03-30 DIAGNOSIS — C772 Secondary and unspecified malignant neoplasm of intra-abdominal lymph nodes: Secondary | ICD-10-CM | POA: Diagnosis not present

## 2015-03-30 DIAGNOSIS — Z800 Family history of malignant neoplasm of digestive organs: Secondary | ICD-10-CM

## 2015-03-30 DIAGNOSIS — C7801 Secondary malignant neoplasm of right lung: Secondary | ICD-10-CM

## 2015-03-30 DIAGNOSIS — C180 Malignant neoplasm of cecum: Secondary | ICD-10-CM

## 2015-03-30 DIAGNOSIS — I808 Phlebitis and thrombophlebitis of other sites: Secondary | ICD-10-CM

## 2015-03-30 DIAGNOSIS — D709 Neutropenia, unspecified: Secondary | ICD-10-CM

## 2015-03-30 DIAGNOSIS — Z85038 Personal history of other malignant neoplasm of large intestine: Secondary | ICD-10-CM

## 2015-03-30 DIAGNOSIS — G622 Polyneuropathy due to other toxic agents: Secondary | ICD-10-CM

## 2015-03-30 DIAGNOSIS — C7800 Secondary malignant neoplasm of unspecified lung: Secondary | ICD-10-CM

## 2015-03-30 DIAGNOSIS — C787 Secondary malignant neoplasm of liver and intrahepatic bile duct: Secondary | ICD-10-CM

## 2015-03-30 DIAGNOSIS — D696 Thrombocytopenia, unspecified: Secondary | ICD-10-CM

## 2015-03-30 LAB — COMPREHENSIVE METABOLIC PANEL (CC13)
ALBUMIN: 3.8 g/dL (ref 3.5–5.0)
ALT: 44 U/L (ref 0–55)
ANION GAP: 8 meq/L (ref 3–11)
AST: 39 U/L — AB (ref 5–34)
Alkaline Phosphatase: 93 U/L (ref 40–150)
BUN: 12.9 mg/dL (ref 7.0–26.0)
CALCIUM: 9.8 mg/dL (ref 8.4–10.4)
CO2: 23 mEq/L (ref 22–29)
Chloride: 107 mEq/L (ref 98–109)
Creatinine: 0.8 mg/dL (ref 0.6–1.1)
EGFR: 81 mL/min/{1.73_m2} — AB (ref >90)
Glucose: 92 mg/dl (ref 70–140)
POTASSIUM: 4.1 meq/L (ref 3.5–5.1)
Sodium: 137 mEq/L (ref 136–145)
TOTAL PROTEIN: 6.7 g/dL (ref 6.4–8.3)
Total Bilirubin: 0.41 mg/dL (ref 0.20–1.20)

## 2015-03-30 LAB — CBC WITH DIFFERENTIAL/PLATELET
BASO%: 0.7 % (ref 0.0–2.0)
BASOS ABS: 0 10*3/uL (ref 0.0–0.1)
EOS%: 3.9 % (ref 0.0–7.0)
Eosinophils Absolute: 0.1 10*3/uL (ref 0.0–0.5)
HCT: 35.9 % (ref 34.8–46.6)
HEMOGLOBIN: 12 g/dL (ref 11.6–15.9)
LYMPH%: 32.4 % (ref 14.0–49.7)
MCH: 31.3 pg (ref 25.1–34.0)
MCHC: 33.6 g/dL (ref 31.5–36.0)
MCV: 93.3 fL (ref 79.5–101.0)
MONO#: 0.6 10*3/uL (ref 0.1–0.9)
MONO%: 18.8 % — ABNORMAL HIGH (ref 0.0–14.0)
NEUT#: 1.4 10*3/uL — ABNORMAL LOW (ref 1.5–6.5)
NEUT%: 44.2 % (ref 38.4–76.8)
Platelets: 80 10*3/uL — ABNORMAL LOW (ref 145–400)
RBC: 3.84 10*6/uL (ref 3.70–5.45)
RDW: 19 % — AB (ref 11.2–14.5)
WBC: 3.3 10*3/uL — AB (ref 3.9–10.3)
lymph#: 1.1 10*3/uL (ref 0.9–3.3)

## 2015-03-30 NOTE — Progress Notes (Signed)
  [facility] OFFICE PROGRESS NOTE   Diagnosis:  Colon cancer  INTERVAL HISTORY:   Ms. Kontos returns as scheduled. She completed cycle 8 FOLFOX/Avastin 03/16/2015. He had mild nausea. No vomiting. No more than 3 loose stools a day. She has mild intermittent tingling in the feet. She has mild numbness in the first 3 digits on the left hand. She denies bleeding. No shortness of breath or chest pain. She reports becoming very "depressed" for several days following the last cycle.  Objective:  Vital signs in last 24 hours:  Blood pressure 146/84, pulse 80, temperature 98.5 F (36.9 C), temperature source Oral, resp. rate 18, height _0  (1.676 m), weight 150 lb 9.6 oz (68.312 kg), SpO2 100 %.    HEENT: No thrush or ulcers. Resp: Lungs clear bilaterally. Cardio: Regular rate and rhythm. GI: Abdomen soft and nontender. No hepatomegaly. Vascular: No leg edema. Calves soft and nontender. Neuro: Mild decrease in vibratory sense over the fingertips per tuning fork exam.  Skin: No rash. Port-A-Cath without erythema.    Lab Results:  Lab Results  Component Value Date   WBC 3.3* 03/30/2015   HGB 12.0 03/30/2015   HCT 35.9 03/30/2015   MCV 93.3 03/30/2015   PLT 80* 03/30/2015   NEUTROABS 1.4* 03/30/2015    Imaging:  No results found.  Medications: I have reviewed the patient's current medications.  Assessment/Plan: 1. 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, status post a right colectomy 08/31/2014  no loss of mismatch repair protein expression, microsatellite stable  K-ras wild-type by standard testing  Staging CT scans December 2015 and PET scan 09/22/2014 consistent with metastatic bilateral lung nodules, metastatic retroperitoneal lymphadenopathy, and a probable left liver metastasis  Cycle 1 CAPOX 10/29/2014 (Xeloda discontinued day 9 secondary to diarrhea)   CT 11/14/2014 revealed progressive  metastatic disease at the lung bases and liver compared to a PET scan from 09/22/2014, mild improvement in retroperitoneal lymphadenopathy  Cycle 1 FOLFOX 12/02/2014  Cycle 2 FOLFOX, Avastin added 12/16/2014  Cycle 3 FOLFOX/Avastin 12/30/2014  Cycle 4 FOLFOX/Avastin 01/13/2015  Cycle 5 FOLFOX/Avastin 01/27/2015  CT 02/10/2015 with improvement in hepatic metastases, mild improvement of lung metastases and retroperitoneal lymphadenopathy  Cycle 6 FOLFOX/Avastin 02/16/2015  Cycle 7 FOLFOX/Avastin 03/02/2015  Cycle 8 FOLFOX/Avastin 03/16/2015 2. Elevated TSH, PET scan 09/22/2014 with diffuse increased metabolic uptake in the thyroid   3. Family history of multiple cancers including colon cancer in her paternal grandfather and rectal cancer in a paternal first cousin  21. Admission 11/14/2014 with nausea and diarrhea, most likely secondary to capecitabine induced enteritis-resolved. CT 11/14/2014 consistent with ileitis  5.Superficial phlebitis left hand/wrist 12/30/2014.  6. Early oxaliplatin neuropathy   Disposition: Ms. Gulick appears stable. She has completed 8 cycles of FOLFOX/Avastin. She has mild thrombocytopenia and neutropenia on labs today. She declines Neulasta. We will hold today's treatment and reschedule for one week. Restaging CT evaluation planned on 04/16/2015. She will return for a follow-up visit on 04/20/2015. She will contact the office in the interim with any problems.  Plan reviewed with Dr. Benay Spice.  Ned Card ANP/GNP-BC   03/30/2015  12:25 PM

## 2015-03-31 LAB — CEA: CEA: 1.8 ng/mL (ref 0.0–5.0)

## 2015-04-01 ENCOUNTER — Other Ambulatory Visit: Payer: Self-pay | Admitting: *Deleted

## 2015-04-01 ENCOUNTER — Telehealth: Payer: Self-pay | Admitting: *Deleted

## 2015-04-01 NOTE — Progress Notes (Signed)
POF re-entered; CT scan should be 8/5 not 7/26; FOLFOX to be ordered for 7/26

## 2015-04-01 NOTE — Telephone Encounter (Signed)
Per staff message I have scheduled appts 

## 2015-04-02 ENCOUNTER — Encounter: Payer: Self-pay | Admitting: Oncology

## 2015-04-02 NOTE — Progress Notes (Signed)
I placed [employer] insurance form on desk of nurse for dr. Benay Spice.

## 2015-04-05 ENCOUNTER — Encounter: Payer: Self-pay | Admitting: Oncology

## 2015-04-05 ENCOUNTER — Telehealth: Payer: Self-pay | Admitting: Oncology

## 2015-04-05 ENCOUNTER — Other Ambulatory Visit: Payer: Self-pay | Admitting: *Deleted

## 2015-04-05 NOTE — Telephone Encounter (Signed)
Left message to confirm appointment for 07/26 & 08/05

## 2015-04-05 NOTE — Progress Notes (Signed)
I faxed securian [phone]

## 2015-04-05 NOTE — Telephone Encounter (Signed)
S/w pt earlier advising of D/T for tomorrow labs/infusion, pt states she can't do that early do to her drive, also request that she get AP # for scheduling radiology, called pt back lft msg for time change on 07/26 and text pt with AP # per pt's request... KJ

## 2015-04-06 ENCOUNTER — Ambulatory Visit (HOSPITAL_COMMUNITY): Payer: 59

## 2015-04-06 ENCOUNTER — Other Ambulatory Visit (HOSPITAL_BASED_OUTPATIENT_CLINIC_OR_DEPARTMENT_OTHER): Payer: 59

## 2015-04-06 ENCOUNTER — Other Ambulatory Visit: Payer: Self-pay | Admitting: *Deleted

## 2015-04-06 ENCOUNTER — Ambulatory Visit: Payer: 59

## 2015-04-06 ENCOUNTER — Encounter: Payer: Self-pay | Admitting: *Deleted

## 2015-04-06 ENCOUNTER — Telehealth: Payer: Self-pay | Admitting: *Deleted

## 2015-04-06 DIAGNOSIS — C7800 Secondary malignant neoplasm of unspecified lung: Secondary | ICD-10-CM

## 2015-04-06 DIAGNOSIS — C182 Malignant neoplasm of ascending colon: Secondary | ICD-10-CM | POA: Diagnosis not present

## 2015-04-06 DIAGNOSIS — C180 Malignant neoplasm of cecum: Secondary | ICD-10-CM

## 2015-04-06 LAB — COMPREHENSIVE METABOLIC PANEL (CC13)
ALK PHOS: 94 U/L (ref 40–150)
ALT: 41 U/L (ref 0–55)
ANION GAP: 8 meq/L (ref 3–11)
AST: 37 U/L — AB (ref 5–34)
Albumin: 3.8 g/dL (ref 3.5–5.0)
BUN: 13.8 mg/dL (ref 7.0–26.0)
CHLORIDE: 107 meq/L (ref 98–109)
CO2: 24 meq/L (ref 22–29)
Calcium: 9.7 mg/dL (ref 8.4–10.4)
Creatinine: 0.8 mg/dL (ref 0.6–1.1)
EGFR: 79 mL/min/{1.73_m2} — AB (ref >90)
Glucose: 99 mg/dl (ref 70–140)
POTASSIUM: 4.1 meq/L (ref 3.5–5.1)
Sodium: 138 mEq/L (ref 136–145)
Total Bilirubin: 0.37 mg/dL (ref 0.20–1.20)
Total Protein: 6.8 g/dL (ref 6.4–8.3)

## 2015-04-06 LAB — CEA: CEA: 1.4 ng/mL (ref 0.0–5.0)

## 2015-04-06 LAB — CBC WITH DIFFERENTIAL/PLATELET
BASO%: 0.7 % (ref 0.0–2.0)
BASOS ABS: 0 10*3/uL (ref 0.0–0.1)
EOS%: 5.5 % (ref 0.0–7.0)
Eosinophils Absolute: 0.2 10*3/uL (ref 0.0–0.5)
HEMATOCRIT: 36.3 % (ref 34.8–46.6)
HGB: 12.2 g/dL (ref 11.6–15.9)
LYMPH#: 1 10*3/uL (ref 0.9–3.3)
LYMPH%: 32.7 % (ref 14.0–49.7)
MCH: 31.6 pg (ref 25.1–34.0)
MCHC: 33.6 g/dL (ref 31.5–36.0)
MCV: 93.8 fL (ref 79.5–101.0)
MONO#: 0.6 10*3/uL (ref 0.1–0.9)
MONO%: 20.7 % — AB (ref 0.0–14.0)
NEUT%: 40.4 % (ref 38.4–76.8)
NEUTROS ABS: 1.3 10*3/uL — AB (ref 1.5–6.5)
PLATELETS: 118 10*3/uL — AB (ref 145–400)
RBC: 3.87 10*6/uL (ref 3.70–5.45)
RDW: 19 % — AB (ref 11.2–14.5)
WBC: 3.1 10*3/uL — ABNORMAL LOW (ref 3.9–10.3)

## 2015-04-06 LAB — UA PROTEIN, DIPSTICK - CHCC: PROTEIN: NEGATIVE mg/dL

## 2015-04-06 NOTE — Progress Notes (Signed)
Per Dr. Benay Spice; notified pt that MD aware of decision to not receive chemo today and informed pt that appt will be set up for next Tuesday lab/tx and CT scan will have to be pushed back a week.  Pt went into great detail re: her decision to not get chemo today; emotional support given and all questions answered.  Pt verbalized understanding of importance of keeping on schedule; confirmed she will return next Tues and understands scan will be re-scheduled.

## 2015-04-06 NOTE — Telephone Encounter (Signed)
Per staff message and POF I have scheduled appts. Advised scheduler of appts. JMW  

## 2015-04-06 NOTE — Progress Notes (Signed)
Per Dr. Benay Spice pt may get chemo today despite low blood counts, if she will agree to a neulasta shot. Pt refused neulasta shot, prefers to wait until next week for chemotherapy.

## 2015-04-06 NOTE — Progress Notes (Signed)
Oncology Nurse Navigator Documentation  Oncology Nurse Navigator Flowsheets 04/06/2015  Navigator Encounter Type 3 month-  Patient Visit Type Medonc  Treatment Phase Treatment-declines chemo today. Does not want Neulasta injection  Barriers/Navigation Needs Family concerns-finances  Support Groups/Services Wants support peer to contact her via text instead of calling-she will not answer unknown #  Time Spent with Patient 10  Reports feeling well physically and emotionally today. Does not want chemo today because she does not want Neulasta. Still verbalizes hope that there will be no cancer seen on her scan and she will be cured.

## 2015-04-06 NOTE — Telephone Encounter (Signed)
PT. DOES NOT WANT TO TAKE NEULASTA. HOWEVER WILL DELAYING TREATMENT ANOTHER WEEK AFFECT REMISSION? VERBAL ORDER AND READ BACK TO DR.SHERRILL- DELAYING TREATMENT ANOTHER WEEK WILL NOT AFFECT REMISSION BUT WILL NEED TO PUSH TREATMENT OUT ANOTHER WEEK AND THEN THE CT SCAN OUT ANOTHER WEEK. NOTIFIED PT. OF THE ABOVE INFORMATION. SHE VOICES UNDERSTANDING.

## 2015-04-07 ENCOUNTER — Telehealth: Payer: Self-pay | Admitting: Oncology

## 2015-04-07 NOTE — Telephone Encounter (Signed)
Lft msg for pt confirming labs/ov/chemo r/s and mailed schedule to pt... KJ

## 2015-04-08 ENCOUNTER — Encounter: Payer: Self-pay | Admitting: *Deleted

## 2015-04-08 NOTE — Progress Notes (Signed)
[place] Work  Clinical Social Work was referred by patient navigator for assessment of psychosocial needs and possible Nutritional therapist.  Clinical Social Worker contacted patient at home to offer support and assess for needs.  Pt continues to struggle with her diagnosis and life balance. She seems very tried of cancer and the stress it puts on her. CSW provided supportive listening. Pt wanted to talk with another pt about whether or not to continue treatment. This does not seem to be an appropriate place for a peer mentor to assist. Pt open to [place] attending next appt with MD for additional support. Pt also open to consider GI Support Group and Duanne Limerick for additional support network. Pt still considering insurance options for when she is no longer employed. Pt agrees to reach out to CSW if she wants to talk more or vent.   Clinical Social Work interventions: Supportive listening Loren Racer, [place] Worker [place]  [place] Phone: [phone] Fax: [phone]

## 2015-04-13 ENCOUNTER — Ambulatory Visit (HOSPITAL_BASED_OUTPATIENT_CLINIC_OR_DEPARTMENT_OTHER): Payer: 59

## 2015-04-13 ENCOUNTER — Other Ambulatory Visit (HOSPITAL_BASED_OUTPATIENT_CLINIC_OR_DEPARTMENT_OTHER): Payer: 59

## 2015-04-13 VITALS — BP 113/70 | HR 76 | Temp 98.0°F | Resp 20

## 2015-04-13 DIAGNOSIS — C180 Malignant neoplasm of cecum: Secondary | ICD-10-CM

## 2015-04-13 DIAGNOSIS — Z5111 Encounter for antineoplastic chemotherapy: Secondary | ICD-10-CM

## 2015-04-13 DIAGNOSIS — C7800 Secondary malignant neoplasm of unspecified lung: Secondary | ICD-10-CM

## 2015-04-13 DIAGNOSIS — C7801 Secondary malignant neoplasm of right lung: Secondary | ICD-10-CM

## 2015-04-13 DIAGNOSIS — C7802 Secondary malignant neoplasm of left lung: Secondary | ICD-10-CM

## 2015-04-13 DIAGNOSIS — C182 Malignant neoplasm of ascending colon: Secondary | ICD-10-CM

## 2015-04-13 DIAGNOSIS — Z5112 Encounter for antineoplastic immunotherapy: Secondary | ICD-10-CM | POA: Diagnosis not present

## 2015-04-13 DIAGNOSIS — C772 Secondary and unspecified malignant neoplasm of intra-abdominal lymph nodes: Secondary | ICD-10-CM

## 2015-04-13 DIAGNOSIS — C787 Secondary malignant neoplasm of liver and intrahepatic bile duct: Secondary | ICD-10-CM

## 2015-04-13 LAB — CBC WITH DIFFERENTIAL/PLATELET
BASO%: 0.7 % (ref 0.0–2.0)
BASOS ABS: 0 10*3/uL (ref 0.0–0.1)
EOS%: 5.8 % (ref 0.0–7.0)
Eosinophils Absolute: 0.3 10*3/uL (ref 0.0–0.5)
HEMATOCRIT: 36.6 % (ref 34.8–46.6)
HGB: 12.4 g/dL (ref 11.6–15.9)
LYMPH#: 1.3 10*3/uL (ref 0.9–3.3)
LYMPH%: 29.9 % (ref 14.0–49.7)
MCH: 31.9 pg (ref 25.1–34.0)
MCHC: 33.8 g/dL (ref 31.5–36.0)
MCV: 94.6 fL (ref 79.5–101.0)
MONO#: 0.6 10*3/uL (ref 0.1–0.9)
MONO%: 13.6 % (ref 0.0–14.0)
NEUT#: 2.2 10*3/uL (ref 1.5–6.5)
NEUT%: 50 % (ref 38.4–76.8)
PLATELETS: 109 10*3/uL — AB (ref 145–400)
RBC: 3.87 10*6/uL (ref 3.70–5.45)
RDW: 18.7 % — AB (ref 11.2–14.5)
WBC: 4.5 10*3/uL (ref 3.9–10.3)

## 2015-04-13 LAB — COMPREHENSIVE METABOLIC PANEL (CC13)
ALT: 37 U/L (ref 0–55)
AST: 35 U/L — ABNORMAL HIGH (ref 5–34)
Albumin: 3.8 g/dL (ref 3.5–5.0)
Alkaline Phosphatase: 88 U/L (ref 40–150)
Anion Gap: 6 mEq/L (ref 3–11)
BILIRUBIN TOTAL: 0.43 mg/dL (ref 0.20–1.20)
BUN: 10.2 mg/dL (ref 7.0–26.0)
CALCIUM: 9.7 mg/dL (ref 8.4–10.4)
CO2: 27 meq/L (ref 22–29)
Chloride: 106 mEq/L (ref 98–109)
Creatinine: 0.8 mg/dL (ref 0.6–1.1)
EGFR: 78 mL/min/{1.73_m2} — ABNORMAL LOW (ref >90)
Glucose: 96 mg/dl (ref 70–140)
POTASSIUM: 4 meq/L (ref 3.5–5.1)
Sodium: 139 mEq/L (ref 136–145)
TOTAL PROTEIN: 6.6 g/dL (ref 6.4–8.3)

## 2015-04-13 MED ORDER — SODIUM CHLORIDE 0.9 % IV SOLN
1800.0000 mg/m2 | INTRAVENOUS | Status: DC
  Administered 2015-04-13: 3100 mg via INTRAVENOUS
  Filled 2015-04-13: qty 62

## 2015-04-13 MED ORDER — SODIUM CHLORIDE 0.9 % IV SOLN
Freq: Once | INTRAVENOUS | Status: AC
  Administered 2015-04-13: 12:00:00 via INTRAVENOUS
  Filled 2015-04-13: qty 5

## 2015-04-13 MED ORDER — PALONOSETRON HCL INJECTION 0.25 MG/5ML
INTRAVENOUS | Status: AC
  Filled 2015-04-13: qty 5

## 2015-04-13 MED ORDER — SODIUM CHLORIDE 0.9 % IV SOLN
Freq: Once | INTRAVENOUS | Status: DC
Start: 2015-04-13 — End: 2015-04-13

## 2015-04-13 MED ORDER — DEXTROSE 5 % IV SOLN
Freq: Once | INTRAVENOUS | Status: AC
  Administered 2015-04-13: 13:00:00 via INTRAVENOUS

## 2015-04-13 MED ORDER — OXALIPLATIN CHEMO INJECTION 100 MG/20ML
85.0000 mg/m2 | Freq: Once | INTRAVENOUS | Status: AC
  Administered 2015-04-13: 145 mg via INTRAVENOUS
  Filled 2015-04-13: qty 29

## 2015-04-13 MED ORDER — SODIUM CHLORIDE 0.9 % IJ SOLN
10.0000 mL | INTRAMUSCULAR | Status: DC | PRN
  Filled 2015-04-13: qty 10

## 2015-04-13 MED ORDER — PALONOSETRON HCL INJECTION 0.25 MG/5ML
0.2500 mg | Freq: Once | INTRAVENOUS | Status: AC
Start: 2015-04-13 — End: 2015-04-13
  Administered 2015-04-13: 0.25 mg via INTRAVENOUS

## 2015-04-13 MED ORDER — SODIUM CHLORIDE 0.9 % IV SOLN
5.0000 mg/kg | Freq: Once | INTRAVENOUS | Status: AC
  Administered 2015-04-13: 325 mg via INTRAVENOUS
  Filled 2015-04-13: qty 13

## 2015-04-13 MED ORDER — LEUCOVORIN CALCIUM INJECTION 350 MG
400.0000 mg/m2 | Freq: Once | INTRAVENOUS | Status: AC
  Administered 2015-04-13: 688 mg via INTRAVENOUS
  Filled 2015-04-13: qty 34.4

## 2015-04-13 MED ORDER — HEPARIN SOD (PORK) LOCK FLUSH 100 UNIT/ML IV SOLN
500.0000 [IU] | Freq: Once | INTRAVENOUS | Status: DC | PRN
  Filled 2015-04-13: qty 5

## 2015-04-13 NOTE — Patient Instructions (Addendum)
[facility] Discharge Instructions for Patients Receiving Chemotherapy  Today you received the following chemotherapy agents bevacizumab, oxaliplatin, leucovorin and fluorouracil.  To help prevent nausea and vomiting after your treatment, we encourage you to take your nausea medication.   If you develop nausea and vomiting that is not controlled by your nausea medication, call the clinic.   BELOW ARE SYMPTOMS THAT SHOULD BE REPORTED IMMEDIATELY:  *FEVER GREATER THAN 100.5 F  *CHILLS WITH OR WITHOUT FEVER  NAUSEA AND VOMITING THAT IS NOT CONTROLLED WITH YOUR NAUSEA MEDICATION  *UNUSUAL SHORTNESS OF BREATH  *UNUSUAL BRUISING OR BLEEDING  TENDERNESS IN MOUTH AND THROAT WITH OR WITHOUT PRESENCE OF ULCERS  *URINARY PROBLEMS  *BOWEL PROBLEMS  UNUSUAL RASH Items with * indicate a potential emergency and should be followed up as soon as possible.  Feel free to call the clinic you have any questions or concerns. The clinic phone number is [phone].  Please show the [place] at check-in to the Emergency Department and triage nurse.

## 2015-04-15 ENCOUNTER — Ambulatory Visit (HOSPITAL_BASED_OUTPATIENT_CLINIC_OR_DEPARTMENT_OTHER): Payer: 59

## 2015-04-15 VITALS — BP 131/73 | HR 62 | Temp 98.9°F | Resp 18

## 2015-04-15 DIAGNOSIS — C182 Malignant neoplasm of ascending colon: Secondary | ICD-10-CM | POA: Diagnosis not present

## 2015-04-15 DIAGNOSIS — C7802 Secondary malignant neoplasm of left lung: Secondary | ICD-10-CM | POA: Diagnosis not present

## 2015-04-15 DIAGNOSIS — C7801 Secondary malignant neoplasm of right lung: Secondary | ICD-10-CM

## 2015-04-15 DIAGNOSIS — C787 Secondary malignant neoplasm of liver and intrahepatic bile duct: Secondary | ICD-10-CM

## 2015-04-15 DIAGNOSIS — C180 Malignant neoplasm of cecum: Secondary | ICD-10-CM

## 2015-04-15 DIAGNOSIS — C772 Secondary and unspecified malignant neoplasm of intra-abdominal lymph nodes: Secondary | ICD-10-CM | POA: Diagnosis not present

## 2015-04-15 MED ORDER — HEPARIN SOD (PORK) LOCK FLUSH 100 UNIT/ML IV SOLN
500.0000 [IU] | Freq: Once | INTRAVENOUS | Status: AC | PRN
  Administered 2015-04-15: 500 [IU]
  Filled 2015-04-15: qty 5

## 2015-04-15 MED ORDER — SODIUM CHLORIDE 0.9 % IJ SOLN
10.0000 mL | INTRAMUSCULAR | Status: DC | PRN
  Administered 2015-04-15: 10 mL
  Filled 2015-04-15: qty 10

## 2015-04-15 NOTE — Patient Instructions (Signed)

## 2015-04-16 ENCOUNTER — Other Ambulatory Visit: Payer: 59

## 2015-04-16 ENCOUNTER — Encounter: Payer: Self-pay | Admitting: *Deleted

## 2015-04-16 ENCOUNTER — Other Ambulatory Visit (HOSPITAL_COMMUNITY): Payer: 59

## 2015-04-16 NOTE — Progress Notes (Signed)
[place] Work  Clinical Social Work received voicemail from patient.  CSW attempted to call patient back, LVM to return call when convenient.   Polo Riley, MSW, LCSW, OSW-C Clinical Social Worker [facility] [phone]

## 2015-04-19 ENCOUNTER — Encounter: Payer: Self-pay | Admitting: Oncology

## 2015-04-19 NOTE — Progress Notes (Signed)
I placed fmla form for evan on desk of dr sherrill.

## 2015-04-20 ENCOUNTER — Ambulatory Visit (HOSPITAL_COMMUNITY)
Admission: RE | Admit: 2015-04-20 | Discharge: 2015-04-20 | Disposition: A | Discharge status: Home / Self Care | Payer: 59 | Source: Ambulatory Visit | Attending: Nurse Practitioner | Admitting: Nurse Practitioner

## 2015-04-20 ENCOUNTER — Ambulatory Visit: Payer: 59 | Admitting: Oncology

## 2015-04-20 ENCOUNTER — Other Ambulatory Visit (HOSPITAL_COMMUNITY): Payer: 59

## 2015-04-20 ENCOUNTER — Encounter: Payer: Self-pay | Admitting: Oncology

## 2015-04-20 DIAGNOSIS — R590 Localized enlarged lymph nodes: Secondary | ICD-10-CM | POA: Insufficient documentation

## 2015-04-20 DIAGNOSIS — C787 Secondary malignant neoplasm of liver and intrahepatic bile duct: Secondary | ICD-10-CM | POA: Diagnosis not present

## 2015-04-20 DIAGNOSIS — Z08 Encounter for follow-up examination after completed treatment for malignant neoplasm: Secondary | ICD-10-CM | POA: Insufficient documentation

## 2015-04-20 DIAGNOSIS — C180 Malignant neoplasm of cecum: Secondary | ICD-10-CM | POA: Insufficient documentation

## 2015-04-20 DIAGNOSIS — J984 Other disorders of lung: Secondary | ICD-10-CM | POA: Insufficient documentation

## 2015-04-20 MED ORDER — IOHEXOL 300 MG/ML  SOLN
100.0000 mL | Freq: Once | INTRAMUSCULAR | Status: AC | PRN
  Administered 2015-04-20: 100 mL via INTRAVENOUS

## 2015-04-20 NOTE — Progress Notes (Signed)
I called Martha Becker [phone] to let him know the fmla forms for him for his mom are ready. He is going to call me bck with fax# to get to him.

## 2015-04-21 ENCOUNTER — Encounter: Payer: Self-pay | Admitting: *Deleted

## 2015-04-21 ENCOUNTER — Telehealth: Payer: Self-pay | Admitting: *Deleted

## 2015-04-21 NOTE — Progress Notes (Signed)
[place] Work  Clinical Social Work was contacted by pt due to stress and concerns about scans and whether or not to continue treatment. Pt continues to openly share her anxieties and concerns about continuing treatment. She shared she is tired and wants to spend time with her family and "enjoy life". CSW provided supportive listening. Pt eager to get some answers about her scans and for her appt on Friday. CSW to check in at Friday's appointment. Pt appreciated return call.   Loren Racer, [place] Worker [place]  [place] Phone: [phone] Fax: [phone]

## 2015-04-21 NOTE — Telephone Encounter (Signed)
VM message received from patient requesting results of CT Scans done yesterday, 04/20/15

## 2015-04-21 NOTE — Telephone Encounter (Signed)
Liver and lung lesions are better,, Results in my chart

## 2015-04-21 NOTE — Telephone Encounter (Signed)
TC to patient on designated phone #. No answer. Left VM for pt to call back @ her convenience

## 2015-04-22 ENCOUNTER — Encounter: Payer: Self-pay | Admitting: Oncology

## 2015-04-22 NOTE — Progress Notes (Signed)
I called to let Martha Becker know I will leave copy of forms at front desk when his mom comes in 04/23/15. See prev notes. He never called me back with fax#

## 2015-04-23 ENCOUNTER — Telehealth: Payer: Self-pay | Admitting: Oncology

## 2015-04-23 ENCOUNTER — Encounter: Payer: Self-pay | Admitting: *Deleted

## 2015-04-23 ENCOUNTER — Encounter: Payer: Self-pay | Admitting: Oncology

## 2015-04-23 ENCOUNTER — Ambulatory Visit (HOSPITAL_BASED_OUTPATIENT_CLINIC_OR_DEPARTMENT_OTHER): Payer: 59 | Admitting: Oncology

## 2015-04-23 VITALS — BP 142/88 | HR 79 | Temp 97.5°F | Resp 18 | Ht 66.0 in | Wt 151.8 lb

## 2015-04-23 DIAGNOSIS — C180 Malignant neoplasm of cecum: Secondary | ICD-10-CM

## 2015-04-23 DIAGNOSIS — C182 Malignant neoplasm of ascending colon: Secondary | ICD-10-CM | POA: Diagnosis not present

## 2015-04-23 NOTE — Progress Notes (Addendum)
[place] OFFICE PROGRESS NOTE   Diagnosis: Colon cancer  INTERVAL HISTORY:   Martha Becker completed another cycle of FOLFOX/Avastin 04/13/2015. She reports both constipation and diarrhea following chemotherapy. Her chief complaint is "depression "following chemotherapy. She describes the depression as severe. This improves when she is one to 2 weeks out from a cycle of chemotherapy. Numbness between the right first and second toes. No other neuropathy symptoms.   Objective:  Vital signs in last 24 hours:  Blood pressure 142/88, pulse 79, temperature 97.5 F (36.4 C), temperature source Oral, resp. rate 18, height 5' 6"  (1.676 m), weight 151 lb 12.8 oz (68.856 kg), SpO2 100 %.    HEENT: No thrush or ulcers Resp: Lungs clear bilaterally Cardio: Regular rate and rhythm GI: No hepatomegaly, nontender, no mass Vascular: No leg edema  Skin: Mild hyperpigmentation of the palms   Portacath/PICC-without erythema  Lab Results:  Lab Results  Component Value Date   WBC 4.5 04/13/2015   HGB 12.4 04/13/2015   HCT 36.6 04/13/2015   MCV 94.6 04/13/2015   PLT 109* 04/13/2015   NEUTROABS 2.2 04/13/2015      Lab Results  Component Value Date   CEA 1.4 04/06/2015    Imaging:  Ct Chest W Contrast  04/20/2015   CLINICAL DATA:  Restaging colon cancer  EXAM: CT CHEST, ABDOMEN, AND PELVIS WITH CONTRAST  TECHNIQUE: Multidetector CT imaging of the chest, abdomen and pelvis was performed following the standard protocol during bolus administration of intravenous contrast.  CONTRAST:  119m OMNIPAQUE IOHEXOL 300 MG/ML  SOLN  COMPARISON:  02/10/2015  FINDINGS: CT CHEST FINDINGS  Mediastinum: Heart size is normal. There is no pericardial effusion identified  Lungs/Pleura: No pleural effusion. Multiple pulmonary nodules are identified in both lungs. Index nodule within the left upper lobe 5 mm, image 24/ series 8. Previously 7 mm. Index nodule within the right lower lobe measures 7 mm,  image 37/series 8. Previously 1 cm. Left lower lobe nodule measures 5 mm, image 39/ series 8. Previously 6 mm.  Musculoskeletal: There are no aggressive lytic or sclerotic bone lesions identified.  CT ABDOMEN AND PELVIS FINDINGS  Hepatobiliary: Liver cysts are again noted. 1.4 cm low attenuation structure in the lateral segment of left lobe of liver is identified, image 55/ series 2. Previously 1.7 cm. Index metastasis within the posterior right hepatic lobe measures 1.2 cm, image 61/ series 2 previously 1.6 cm. Index lesion within medial segment of left lobe measures 1 cm, image 63/ series 2. Previously 1.2 cm. Stable liver cysts.  Pancreas: Normal appearance of the pancreas.  Spleen: The spleen is on unremarkable.  Adrenals/Urinary Tract: The adrenal glands are normal. Unremarkable appearance of both kidneys. The urinary bladder is normal.  Stomach/Bowel: The stomach is normal. The small bowel loops have a normal course and caliber. There is no evidence for bowel obstruction. Status post right hemicolectomy.  Vascular/Lymphatic: Normal appearance of the abdominal aorta. Retroperitoneal adenopathy is again noted. Index periaortic node measures 9 mm, image 70/ series 2. Previously 1.3 cm. Adjacent periaortic node measures 9 mm, image 71/series 2. Previously 11 mm. No pelvic or inguinal adenopathy.  Reproductive: The uterus and the adnexal structures are on unremarkable.  Other: There is no ascites or focal fluid collections within the abdomen or pelvis.  Musculoskeletal: There are no aggressive lytic or sclerotic bone lesions identified.  IMPRESSION: 1. Interval response to therapy. 2. Decrease in size of index pulmonary lesions. 3. Interval decrease in size of index  liver metastasis. 4. Decrease in size of retroperitoneal adenopathy.   Electronically Signed   By: Kerby Moors M.D.   On: 04/20/2015 13:00   Ct Abdomen Pelvis W Contrast  04/20/2015   CLINICAL DATA:  Restaging colon cancer  EXAM: CT CHEST, ABDOMEN,  AND PELVIS WITH CONTRAST  TECHNIQUE: Multidetector CT imaging of the chest, abdomen and pelvis was performed following the standard protocol during bolus administration of intravenous contrast.  CONTRAST:  161m OMNIPAQUE IOHEXOL 300 MG/ML  SOLN  COMPARISON:  02/10/2015  FINDINGS: CT CHEST FINDINGS  Mediastinum: Heart size is normal. There is no pericardial effusion identified  Lungs/Pleura: No pleural effusion. Multiple pulmonary nodules are identified in both lungs. Index nodule within the left upper lobe 5 mm, image 24/ series 8. Previously 7 mm. Index nodule within the right lower lobe measures 7 mm, image 37/series 8. Previously 1 cm. Left lower lobe nodule measures 5 mm, image 39/ series 8. Previously 6 mm.  Musculoskeletal: There are no aggressive lytic or sclerotic bone lesions identified.  CT ABDOMEN AND PELVIS FINDINGS  Hepatobiliary: Liver cysts are again noted. 1.4 cm low attenuation structure in the lateral segment of left lobe of liver is identified, image 55/ series 2. Previously 1.7 cm. Index metastasis within the posterior right hepatic lobe measures 1.2 cm, image 61/ series 2 previously 1.6 cm. Index lesion within medial segment of left lobe measures 1 cm, image 63/ series 2. Previously 1.2 cm. Stable liver cysts.  Pancreas: Normal appearance of the pancreas.  Spleen: The spleen is on unremarkable.  Adrenals/Urinary Tract: The adrenal glands are normal. Unremarkable appearance of both kidneys. The urinary bladder is normal.  Stomach/Bowel: The stomach is normal. The small bowel loops have a normal course and caliber. There is no evidence for bowel obstruction. Status post right hemicolectomy.  Vascular/Lymphatic: Normal appearance of the abdominal aorta. Retroperitoneal adenopathy is again noted. Index periaortic node measures 9 mm, image 70/ series 2. Previously 1.3 cm. Adjacent periaortic node measures 9 mm, image 71/series 2. Previously 11 mm. No pelvic or inguinal adenopathy.  Reproductive: The  uterus and the adnexal structures are on unremarkable.  Other: There is no ascites or focal fluid collections within the abdomen or pelvis.  Musculoskeletal: There are no aggressive lytic or sclerotic bone lesions identified.  IMPRESSION: 1. Interval response to therapy. 2. Decrease in size of index pulmonary lesions. 3. Interval decrease in size of index liver metastasis. 4. Decrease in size of retroperitoneal adenopathy.   Electronically Signed   By: TKerby MoorsM.D.   On: 04/20/2015 13:00    Medications: I have reviewed the patient's current medications.  Assessment/Plan: 1. 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, status post a right colectomy 08/31/2014  no loss of mismatch repair protein expression, microsatellite stable  K-ras wild-type by standard testing,NRAS Q61K mutation identified on Foundation 1 testing  Staging CT scans December 2015 and PET scan 09/22/2014 consistent with metastatic bilateral lung nodules, metastatic retroperitoneal lymphadenopathy, and a probable left liver metastasis  Cycle 1 CAPOX 10/29/2014 (Xeloda discontinued day 9 secondary to diarrhea)   CT 11/14/2014 revealed progressive metastatic disease at the lung bases and liver compared to a PET scan from 09/22/2014, mild improvement in retroperitoneal lymphadenopathy  Cycle 1 FOLFOX 12/02/2014  Cycle 2 FOLFOX, Avastin added 12/16/2014  Cycle 3 FOLFOX/Avastin 12/30/2014  Cycle 4 FOLFOX/Avastin 01/13/2015  Cycle 5 FOLFOX/Avastin 01/27/2015  CT 02/10/2015 with improvement in hepatic metastases, mild improvement of lung metastases and retroperitoneal lymphadenopathy  Cycle 6 FOLFOX/Avastin 02/16/2015  Cycle 7 FOLFOX/Avastin 03/02/2015  Cycle 8 FOLFOX/Avastin 03/16/2015  Cycle 9 FOLFOX/Avastin 04/13/2015  Restaging CTs 04/20/2015 with a decrease in the size of pulmonary nodules, liver lesions, and retroperitoneal lymph nodes  2. Elevated  TSH, PET scan 09/22/2014 with diffuse increased metabolic uptake in the thyroid   3. Family history of multiple cancers including colon cancer in her paternal grandfather and rectal cancer in a paternal first cousin  81. Admission 11/14/2014 with nausea and diarrhea, most likely secondary to capecitabine induced enteritis-resolved. CT 11/14/2014 consistent with ileitis  5.Superficial phlebitis left hand/wrist 12/30/2014.  6. Early oxaliplatin neuropathy    Disposition:  Martha Becker has completed a total of 10 cycles of systemic therapy. I reviewed the CT findings with her. The CTs reveal evidence of a partial clinical response and the CEA is lower. We reviewed treatment options in detail. I discussed maintenance therapy. She understands it is unclear whether this provides an overall survival benefit, but likely increases disease-free survival. She stated repeatedly that chemotherapy and traveling to the [place] adversely affects her quality of life. She favors a treatment break. Her son will be visiting within the next few weeks and she would like to feel well while spending time with her son. We decided to give her a break from systemic therapy.  Martha Becker will return for an office visit and Port-A-Cath flush in 6 weeks. We will plan for a restaging CT evaluation in 3 months.  Betsy Coder, MD  04/23/2015  10:56 AM

## 2015-04-23 NOTE — Progress Notes (Signed)
I placed [employer] forms on desk of nurse for dr. Benay Spice.

## 2015-04-23 NOTE — Progress Notes (Signed)
[place] Work  Clinical Social Work met with pt at follow up appointment. Pt still continues to have multiple questions about insurance, as she is considering switching to [employer]. CSW provided pt with handouts and printed information from [email or web address]. Pt looking forward to taking a six week break from chemo and is looking forward to attending [place] programs and spending time with her son. She is also now open to applying for financial assistance with Duanne Limerick as well. Pt aware to reach out as needed.   Clinical Social Work interventions: Civil engineer, contracting referral and education  Loren Racer, [place] Worker [place]  [place] Phone: [phone] Fax: [phone]

## 2015-04-23 NOTE — Telephone Encounter (Signed)
per pof to sch pt appt-gave pt copy of avs °

## 2015-04-23 NOTE — Progress Notes (Signed)
I called to see date for back to work and she not sure, so ok to put 09/11/15. I faxed to [phone]. I will mail a copy to the patient.

## 2015-05-11 ENCOUNTER — Encounter: Payer: Self-pay | Admitting: Oncology

## 2015-05-11 NOTE — Progress Notes (Signed)
Patient left message some pages for fmla forms for son were missing. I called and left her a message with my fax# to have them fax missing pages to me and we can get them filled out and back for Evon her son.

## 2015-05-12 ENCOUNTER — Encounter: Payer: Self-pay | Admitting: Oncology

## 2015-05-12 NOTE — Progress Notes (Signed)
Evan emailed me the missing page. He has tried to fax to me several times and I still don't have? I faxed to  [phone] and emailed the page back to Ellard Artis so he will have for his records.

## 2015-05-14 ENCOUNTER — Encounter: Payer: Self-pay | Admitting: Oncology

## 2015-05-14 NOTE — Progress Notes (Signed)
I faxed amended fmla forms for son to  [phone] and scanned copy to Ellard Artis patient's son.

## 2015-06-04 ENCOUNTER — Telehealth: Payer: Self-pay | Admitting: Nurse Practitioner

## 2015-06-04 ENCOUNTER — Other Ambulatory Visit: Payer: 59

## 2015-06-04 ENCOUNTER — Ambulatory Visit: Payer: 59 | Admitting: Nurse Practitioner

## 2015-06-04 NOTE — Telephone Encounter (Signed)
Lft msg for pt confirming new D/T for labs/ov, pt cancelled and wanted to r/s... Martha Becker

## 2015-06-11 ENCOUNTER — Ambulatory Visit (HOSPITAL_BASED_OUTPATIENT_CLINIC_OR_DEPARTMENT_OTHER): Payer: 59 | Admitting: Nurse Practitioner

## 2015-06-11 ENCOUNTER — Telehealth: Payer: Self-pay | Admitting: Oncology

## 2015-06-11 ENCOUNTER — Other Ambulatory Visit: Payer: Self-pay | Admitting: *Deleted

## 2015-06-11 ENCOUNTER — Ambulatory Visit: Payer: 59

## 2015-06-11 ENCOUNTER — Other Ambulatory Visit (HOSPITAL_BASED_OUTPATIENT_CLINIC_OR_DEPARTMENT_OTHER): Payer: 59

## 2015-06-11 VITALS — BP 145/95 | HR 97 | Temp 98.2°F | Resp 18 | Ht 66.0 in | Wt 153.2 lb

## 2015-06-11 DIAGNOSIS — C182 Malignant neoplasm of ascending colon: Secondary | ICD-10-CM | POA: Diagnosis not present

## 2015-06-11 DIAGNOSIS — C180 Malignant neoplasm of cecum: Secondary | ICD-10-CM

## 2015-06-11 DIAGNOSIS — C7800 Secondary malignant neoplasm of unspecified lung: Secondary | ICD-10-CM

## 2015-06-11 DIAGNOSIS — Z95828 Presence of other vascular implants and grafts: Secondary | ICD-10-CM

## 2015-06-11 LAB — CBC WITH DIFFERENTIAL/PLATELET
BASO%: 0.4 % (ref 0.0–2.0)
BASOS ABS: 0 10*3/uL (ref 0.0–0.1)
EOS%: 23.3 % — ABNORMAL HIGH (ref 0.0–7.0)
Eosinophils Absolute: 2.2 10*3/uL — ABNORMAL HIGH (ref 0.0–0.5)
HEMATOCRIT: 40.1 % (ref 34.8–46.6)
HGB: 13.4 g/dL (ref 11.6–15.9)
LYMPH#: 1.9 10*3/uL (ref 0.9–3.3)
LYMPH%: 20.6 % (ref 14.0–49.7)
MCH: 32.5 pg (ref 25.1–34.0)
MCHC: 33.4 g/dL (ref 31.5–36.0)
MCV: 97.4 fL (ref 79.5–101.0)
MONO#: 0.8 10*3/uL (ref 0.1–0.9)
MONO%: 8.4 % (ref 0.0–14.0)
NEUT#: 4.4 10*3/uL (ref 1.5–6.5)
NEUT%: 47.3 % (ref 38.4–76.8)
Platelets: 173 10*3/uL (ref 145–400)
RBC: 4.11 10*6/uL (ref 3.70–5.45)
RDW: 14.5 % (ref 11.2–14.5)
WBC: 9.2 10*3/uL (ref 3.9–10.3)

## 2015-06-11 MED ORDER — HEPARIN SOD (PORK) LOCK FLUSH 100 UNIT/ML IV SOLN
500.0000 [IU] | Freq: Once | INTRAVENOUS | Status: AC
  Administered 2015-06-11: 500 [IU] via INTRAVENOUS
  Filled 2015-06-11: qty 5

## 2015-06-11 MED ORDER — SODIUM CHLORIDE 0.9 % IJ SOLN
10.0000 mL | INTRAMUSCULAR | Status: DC | PRN
  Administered 2015-06-11: 10 mL via INTRAVENOUS
  Filled 2015-06-11: qty 10

## 2015-06-11 MED ORDER — IBUPROFEN 600 MG PO TABS: 600.0000 mg | ORAL_TABLET | Freq: Two times a day (BID) | ORAL | Status: DC

## 2015-06-11 NOTE — Patient Instructions (Signed)

## 2015-06-11 NOTE — Progress Notes (Addendum)
[facility] OFFICE PROGRESS NOTE   Diagnosis:  Colon cancer  INTERVAL HISTORY:   Martha Becker returns as scheduled. She overall feels well. She reports a good appetite. Bowels moving regularly. No abdominal pain. She continues to have numbness between the right first and second toes. She has also noted some numbness in the fingertips. Her main complaint today is a greater than one year history of right hip pain. She describes the pain as "aching". The pain is positional and eases with rest. Aleve provides some relief. The pain does not radiate. No back pain. No leg weakness.  Objective:  Vital signs in last 24 hours:  Blood pressure 145/95, pulse 97, temperature 98.2 F (36.8 C), temperature source Oral, resp. rate 18, height 5' 6" (1.676 m), weight 153 lb 3.2 oz (69.491 kg), SpO2 97 %.    HEENT: No thrush or ulcers. Lymphatics: No palpable cervical, supra clavicular, axillary or inguinal lymph nodes. Resp: Lungs clear bilaterally. Cardio: Regular rate and rhythm. GI: Abdomen soft and nontender. No organomegaly. Vascular: No leg edema.  Skin: No rash. Musculoskeletal: Mild discomfort with internal rotation of the right hip. No discomfort with external rotation. No tenderness with palpation over the right hip joint. No rash.    Lab Results:  Lab Results  Component Value Date   WBC 9.2 06/11/2015   HGB 13.4 06/11/2015   HCT 40.1 06/11/2015   MCV 97.4 06/11/2015   PLT 173 06/11/2015   NEUTROABS 4.4 06/11/2015    Imaging:  No results found.  Medications: I have reviewed the patient's current medications.  Assessment/Plan: 1. 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, status post a right colectomy 08/31/2014  no loss of mismatch repair protein expression, microsatellite stable  K-ras wild-type by standard testing,NRAS Q61K mutation identified on Foundation 1 testing  Staging CT scans December 2015 and  PET scan 09/22/2014 consistent with metastatic bilateral lung nodules, metastatic retroperitoneal lymphadenopathy, and a probable left liver metastasis  Cycle 1 CAPOX 10/29/2014 (Xeloda discontinued day 9 secondary to diarrhea)   CT 11/14/2014 revealed progressive metastatic disease at the lung bases and liver compared to a PET scan from 09/22/2014, mild improvement in retroperitoneal lymphadenopathy  Cycle 1 FOLFOX 12/02/2014  Cycle 2 FOLFOX, Avastin added 12/16/2014  Cycle 3 FOLFOX/Avastin 12/30/2014  Cycle 4 FOLFOX/Avastin 01/13/2015  Cycle 5 FOLFOX/Avastin 01/27/2015  CT 02/10/2015 with improvement in hepatic metastases, mild improvement of lung metastases and retroperitoneal lymphadenopathy  Cycle 6 FOLFOX/Avastin 02/16/2015  Cycle 7 FOLFOX/Avastin 03/02/2015  Cycle 8 FOLFOX/Avastin 03/16/2015  Cycle 9 FOLFOX/Avastin 04/13/2015  Restaging CTs 04/20/2015 with a decrease in the size of pulmonary nodules, liver lesions, and retroperitoneal lymph nodes  2. Elevated TSH, PET scan 09/22/2014 with diffuse increased metabolic uptake in the thyroid   3. Family history of multiple cancers including colon cancer in her paternal grandfather and rectal cancer in a paternal first cousin  74. Admission 11/14/2014 with nausea and diarrhea, most likely secondary to capecitabine induced enteritis-resolved. CT 11/14/2014 consistent with ileitis  5.Superficial phlebitis left hand/wrist 12/30/2014.  6. Early oxaliplatin neuropathy     Disposition: Martha Becker appears stable. There is no clinical evidence of progression of the cancer. We will follow-up on the CEA from today. The plan is for restaging CT scans in 6 weeks which will be at a three-month interval from the previous.  The right hip pain is most likely not related to cancer. Question bursitis, question arthritis. She has taken an "anti-inflammatory" in the  past and would like to try this again. She will contact us  with the name of the medication.  She will return for a follow-up visit in 6 weeks with the scans 2-3 days prior. She will contact the office in the interim with any problems.  Patient seen with Dr. Benay Spice.    Ned Card ANP/GNP-BC   06/11/2015  12:07 PM  This was a shared visit with Ned Card. Martha Becker was interviewed and examined. The right hip discomfort is most likely related to a benign musculoskeletal condition, though she could have a metastasis at the spine or pelvis causing this pain. She will try an anti-inflammatory agent. We will image the area and make an orthopedic referral for persistent pain.  [name], M.D.

## 2015-06-11 NOTE — Telephone Encounter (Signed)
Gave and printed appt sched and avs fo rpt; for NOV  °

## 2015-06-11 NOTE — Telephone Encounter (Signed)
Gave and printed appt sched and avs for pt for NOV...gv barium °

## 2015-06-11 NOTE — Telephone Encounter (Signed)
Spoke with patient and told her that Dr. Benay Spice is e-scribing a prescription for ibuprofen. Patient verbalized understanding.

## 2015-06-12 LAB — CEA: CEA: 0.7 ng/mL (ref 0.0–5.0)

## 2015-06-29 ENCOUNTER — Telehealth: Payer: Self-pay | Admitting: *Deleted

## 2015-06-29 NOTE — Telephone Encounter (Signed)
Message from pt reporting R hip and leg pain for bout 2 weeks. Returned call, pt reports hip pain has worsened over the past few weeks though still intermittent. Has been evaluated by PCP and had XRay. Seems to radiate down the back of the leg.  Pt denies any difficulty ambulating. Has been on anti-inflammatory med for over a week with no results. Pt denies edema or erythema in the leg.  Requested she have PCP fax report of plain film to Dr. Benay Spice. She will request this.

## 2015-06-30 ENCOUNTER — Telehealth: Payer: Self-pay | Admitting: *Deleted

## 2015-06-30 ENCOUNTER — Telehealth: Payer: Self-pay | Admitting: Nurse Practitioner

## 2015-06-30 NOTE — Telephone Encounter (Signed)
Per pof patient will check mychart for appointments

## 2015-06-30 NOTE — Telephone Encounter (Signed)
Received lumbar spine Xray via fax from [facility]- reviewed by Dr. Benay Spice. Called pt, her PCP's office had informed her it did not show a reason for her pain. Pt is requesting further imaging as pain persists.  Per Dr. Benay Spice: Pt will need to be evaluated in the office first. Pt voiced understanding, requests appointment for 10/21.   Pt also requests to follow up on scan ordered for Nov. It has not yet been scheduled. It appears it has not yet been authorized.

## 2015-07-01 ENCOUNTER — Telehealth: Payer: Self-pay | Admitting: Oncology

## 2015-07-01 NOTE — Telephone Encounter (Signed)
lvm for pt regarding to [place] appt for ct....advised pt to come see me at [place] visit to confirm where she wants to have the ct scan.

## 2015-07-02 ENCOUNTER — Telehealth: Payer: Self-pay | Admitting: Oncology

## 2015-07-02 ENCOUNTER — Ambulatory Visit (HOSPITAL_BASED_OUTPATIENT_CLINIC_OR_DEPARTMENT_OTHER): Payer: 59 | Admitting: Nurse Practitioner

## 2015-07-02 VITALS — BP 134/84 | HR 72 | Temp 98.3°F | Resp 18 | Ht 66.0 in | Wt 153.0 lb

## 2015-07-02 DIAGNOSIS — C772 Secondary and unspecified malignant neoplasm of intra-abdominal lymph nodes: Secondary | ICD-10-CM

## 2015-07-02 DIAGNOSIS — C7802 Secondary malignant neoplasm of left lung: Secondary | ICD-10-CM

## 2015-07-02 DIAGNOSIS — C180 Malignant neoplasm of cecum: Secondary | ICD-10-CM

## 2015-07-02 DIAGNOSIS — C787 Secondary malignant neoplasm of liver and intrahepatic bile duct: Secondary | ICD-10-CM | POA: Diagnosis not present

## 2015-07-02 DIAGNOSIS — I808 Phlebitis and thrombophlebitis of other sites: Secondary | ICD-10-CM

## 2015-07-02 DIAGNOSIS — C7801 Secondary malignant neoplasm of right lung: Secondary | ICD-10-CM

## 2015-07-02 DIAGNOSIS — C7800 Secondary malignant neoplasm of unspecified lung: Secondary | ICD-10-CM

## 2015-07-02 DIAGNOSIS — C801 Malignant (primary) neoplasm, unspecified: Secondary | ICD-10-CM

## 2015-07-02 DIAGNOSIS — G620 Drug-induced polyneuropathy: Secondary | ICD-10-CM

## 2015-07-02 DIAGNOSIS — M25551 Pain in right hip: Secondary | ICD-10-CM

## 2015-07-02 DIAGNOSIS — C786 Secondary malignant neoplasm of retroperitoneum and peritoneum: Secondary | ICD-10-CM

## 2015-07-02 NOTE — Telephone Encounter (Signed)
Gave and printed appt sched and avs fo rpt; for NOV  °

## 2015-07-02 NOTE — Progress Notes (Addendum)
[place] OFFICE PROGRESS NOTE   Diagnosis:  Colon cancer  INTERVAL HISTORY:   Martha Becker returns prior to scheduled follow-up. She continues to have right hip pain. More recently she has noted the pain radiates into the posterior right leg with some associated numbness. She feels the neuropathy symptoms in her right foot are worse. She describes the pain as an "ache" similar to a toothache. The pain is present fairly consistently and worsens with weightbearing. She denies back pain. No bowel or bladder dysfunction. She tried an anti-inflammatory medication with no improvement.  Objective:  Vital signs in last 24 hours:  Blood pressure 134/84, pulse 72, temperature 98.3 F (36.8 C), temperature source Oral, resp. rate 18, height _0  (1.676 m), weight 153 lb (69.4 kg), SpO2 100 %.    Resp: Lungs clear bilaterally. Cardio: Regular rate and rhythm. GI: Abdomen soft and nontender. No hepatomegaly. Vascular: No leg edema. Calves soft and nontender. Neuro: Lower extremity motor strength 5 over 5. Knee DTRs 2+, symmetric.  Musculoskeletal: Nontender over the right hip and lower back.   Lab Results:  Lab Results  Component Value Date   WBC 9.2 06/11/2015   HGB 13.4 06/11/2015   HCT 40.1 06/11/2015   MCV 97.4 06/11/2015   PLT 173 06/11/2015   NEUTROABS 4.4 06/11/2015    Imaging:  No results found.  Medications: I have reviewed the patient's current medications.  Assessment/Plan: 1. 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, status post a right colectomy 08/31/2014  no loss of mismatch repair protein expression, microsatellite stable  K-ras wild-type by standard testing,NRAS Q61K mutation identified on Foundation 1 testing  Staging CT scans December 2015 and PET scan 09/22/2014 consistent with metastatic bilateral lung nodules, metastatic retroperitoneal lymphadenopathy, and a probable left liver  metastasis  Cycle 1 CAPOX 10/29/2014 (Xeloda discontinued day 9 secondary to diarrhea)   CT 11/14/2014 revealed progressive metastatic disease at the lung bases and liver compared to a PET scan from 09/22/2014, mild improvement in retroperitoneal lymphadenopathy  Cycle 1 FOLFOX 12/02/2014  Cycle 2 FOLFOX, Avastin added 12/16/2014  Cycle 3 FOLFOX/Avastin 12/30/2014  Cycle 4 FOLFOX/Avastin 01/13/2015  Cycle 5 FOLFOX/Avastin 01/27/2015  CT 02/10/2015 with improvement in hepatic metastases, mild improvement of lung metastases and retroperitoneal lymphadenopathy  Cycle 6 FOLFOX/Avastin 02/16/2015  Cycle 7 FOLFOX/Avastin 03/02/2015  Cycle 8 FOLFOX/Avastin 03/16/2015  Cycle 9 FOLFOX/Avastin 04/13/2015  Restaging CTs 04/20/2015 with a decrease in the size of pulmonary nodules, liver lesions, and retroperitoneal lymph nodes  2. Elevated TSH, PET scan 09/22/2014 with diffuse increased metabolic uptake in the thyroid   3. Family history of multiple cancers including colon cancer in her paternal grandfather and rectal cancer in a paternal first cousin  1. Admission 11/14/2014 with nausea and diarrhea, most likely secondary to capecitabine induced enteritis-resolved. CT 11/14/2014 consistent with ileitis  5.Superficial phlebitis left hand/wrist 12/30/2014.  6. Early oxaliplatin neuropathy   Disposition: Martha Becker has persistent right hip pain now with a radicular component. We are referring her for an MRI of the lumbar spine. We will contact her with the result.  She is scheduled to return for a follow-up visit 07/30/2015 with restaging CT scans on 07/27/2015. We will adjust her follow-up appointment accordingly pending the MRI result.  Patient seen with Dr. Benay Spice.      Ned Card ANP/GNP-BC   07/02/2015  1:06 PM  This was a shared visit with Ned Card. Martha Becker will be referred for an MRI  of the lumbar spine to evaluate the radicular right leg pain.  If this is nondiagnostic the plan is to proceed with restaging CT scans.  [name], M.D.

## 2015-07-05 ENCOUNTER — Telehealth: Payer: Self-pay | Admitting: Oncology

## 2015-07-05 NOTE — Telephone Encounter (Signed)
Spoke with patient re moving mri to [facility] instead of waiting til 11/11 at [facility]. Spoke with [place] in [place] re mri for 1/1 @ 3 pm at [facility]. Patient has new date/time/location and is aware appointment at [facility] cancelled. Other appointments remain the same.

## 2015-07-05 NOTE — Telephone Encounter (Signed)
Returned patient call re ct scan and her request to have it done at [facility]. Scan is on schedule. Message was left for patient 10/20 and appointment scheduled at given to patient at 10/21 visit. Left message for patient confirming ct scan at [facility] and mailed current schedule including ct and f/u @ [place].

## 2015-07-05 NOTE — Telephone Encounter (Signed)
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 Scheduled mri at [facility] 11/11 @ 1 pm (1st available). Patient also has lab/flush @ [facility] 11/11 @ 11 am. Asked that patient call me back should she fell like she cannot wait until 11/11 wished to change the location to  [facility]. Patient was aware I would call back w/mri appointment.

## 2015-07-13 ENCOUNTER — Telehealth: Payer: Self-pay | Admitting: *Deleted

## 2015-07-13 ENCOUNTER — Ambulatory Visit (HOSPITAL_COMMUNITY)
Admission: RE | Admit: 2015-07-13 | Discharge: 2015-07-13 | Disposition: A | Discharge status: Home / Self Care | Payer: 59 | Source: Ambulatory Visit | Attending: Nurse Practitioner | Admitting: Nurse Practitioner

## 2015-07-13 ENCOUNTER — Ambulatory Visit (HOSPITAL_COMMUNITY): Payer: 59

## 2015-07-13 DIAGNOSIS — C786 Secondary malignant neoplasm of retroperitoneum and peritoneum: Secondary | ICD-10-CM | POA: Diagnosis present

## 2015-07-13 DIAGNOSIS — C7800 Secondary malignant neoplasm of unspecified lung: Secondary | ICD-10-CM | POA: Diagnosis present

## 2015-07-13 DIAGNOSIS — M5126 Other intervertebral disc displacement, lumbar region: Secondary | ICD-10-CM | POA: Insufficient documentation

## 2015-07-13 DIAGNOSIS — M5127 Other intervertebral disc displacement, lumbosacral region: Secondary | ICD-10-CM | POA: Diagnosis not present

## 2015-07-13 DIAGNOSIS — C180 Malignant neoplasm of cecum: Secondary | ICD-10-CM | POA: Diagnosis present

## 2015-07-13 DIAGNOSIS — C801 Malignant (primary) neoplasm, unspecified: Secondary | ICD-10-CM | POA: Diagnosis not present

## 2015-07-13 MED ORDER — GADOBENATE DIMEGLUMINE 529 MG/ML IV SOLN
15.0000 mL | Freq: Once | INTRAVENOUS | Status: AC | PRN
  Administered 2015-07-13: 13 mL via INTRAVENOUS

## 2015-07-13 NOTE — Telephone Encounter (Signed)
Message from pt asking if she should have MRI as scheduled or wait to see if CT scan shows what is causing her pain. Informed her the MRI will give a more detailed picture of bones/spine. Pt stated she will have MRI as scheduled today.

## 2015-07-14 ENCOUNTER — Telehealth: Payer: Self-pay | Admitting: *Deleted

## 2015-07-14 NOTE — Telephone Encounter (Signed)
TC from patient regarding results MRI of her lumbar spine done 07/13/15. Pt states that if she needs referral for 'back specialist' she would like Dr. Carloyn Manner who also  works in [place].  She would like referral as soon as possible.

## 2015-07-15 ENCOUNTER — Telehealth: Payer: Self-pay | Admitting: Nurse Practitioner

## 2015-07-15 ENCOUNTER — Telehealth: Payer: Self-pay | Admitting: *Deleted

## 2015-07-15 DIAGNOSIS — C180 Malignant neoplasm of cecum: Secondary | ICD-10-CM

## 2015-07-15 NOTE — Telephone Encounter (Signed)
-----   Message from Ladell Pier, MD sent at 07/14/2015  8:46 PM EDT ----- Please call patient, mri shows no cancer, has disc bulge at L5-S1 Refer to Dr. Carloyn Manner

## 2015-07-15 NOTE — Telephone Encounter (Signed)
Per dr Carloyn Manner in [place] please fax all info to [phone] for surg,left a message fir patient that she will get a call from his office   anne

## 2015-07-15 NOTE — Telephone Encounter (Signed)
Left message on voicemail informing pt that referral is being made to Dr. Carloyn Manner.

## 2015-07-20 ENCOUNTER — Telehealth: Payer: Self-pay | Admitting: *Deleted

## 2015-07-23 ENCOUNTER — Other Ambulatory Visit (HOSPITAL_BASED_OUTPATIENT_CLINIC_OR_DEPARTMENT_OTHER): Payer: 59

## 2015-07-23 ENCOUNTER — Other Ambulatory Visit (HOSPITAL_COMMUNITY): Payer: 59

## 2015-07-23 ENCOUNTER — Ambulatory Visit (HOSPITAL_BASED_OUTPATIENT_CLINIC_OR_DEPARTMENT_OTHER): Payer: 59

## 2015-07-23 VITALS — BP 142/87 | HR 77 | Temp 98.1°F | Resp 18

## 2015-07-23 DIAGNOSIS — C180 Malignant neoplasm of cecum: Secondary | ICD-10-CM

## 2015-07-23 DIAGNOSIS — C7801 Secondary malignant neoplasm of right lung: Secondary | ICD-10-CM

## 2015-07-23 DIAGNOSIS — C787 Secondary malignant neoplasm of liver and intrahepatic bile duct: Secondary | ICD-10-CM

## 2015-07-23 DIAGNOSIS — C7802 Secondary malignant neoplasm of left lung: Secondary | ICD-10-CM | POA: Diagnosis not present

## 2015-07-23 DIAGNOSIS — Z95828 Presence of other vascular implants and grafts: Secondary | ICD-10-CM

## 2015-07-23 DIAGNOSIS — C7800 Secondary malignant neoplasm of unspecified lung: Secondary | ICD-10-CM

## 2015-07-23 DIAGNOSIS — C772 Secondary and unspecified malignant neoplasm of intra-abdominal lymph nodes: Secondary | ICD-10-CM

## 2015-07-23 LAB — COMPREHENSIVE METABOLIC PANEL (CC13)
ALBUMIN: 3.7 g/dL (ref 3.5–5.0)
ALK PHOS: 71 U/L (ref 40–150)
ALT: 16 U/L (ref 0–55)
AST: 21 U/L (ref 5–34)
Anion Gap: 10 mEq/L (ref 3–11)
BILIRUBIN TOTAL: 0.52 mg/dL (ref 0.20–1.20)
BUN: 10.6 mg/dL (ref 7.0–26.0)
CALCIUM: 9.8 mg/dL (ref 8.4–10.4)
CO2: 24 mEq/L (ref 22–29)
CREATININE: 0.9 mg/dL (ref 0.6–1.1)
Chloride: 103 mEq/L (ref 98–109)
EGFR: 76 mL/min/{1.73_m2} — ABNORMAL LOW (ref >90)
Glucose: 79 mg/dl (ref 70–140)
Potassium: 4 mEq/L (ref 3.5–5.1)
Sodium: 137 mEq/L (ref 136–145)
TOTAL PROTEIN: 6.6 g/dL (ref 6.4–8.3)

## 2015-07-23 LAB — CBC WITH DIFFERENTIAL/PLATELET
BASO%: 0.4 % (ref 0.0–2.0)
BASOS ABS: 0 10*3/uL (ref 0.0–0.1)
EOS%: 10.2 % — AB (ref 0.0–7.0)
Eosinophils Absolute: 0.9 10*3/uL — ABNORMAL HIGH (ref 0.0–0.5)
HEMATOCRIT: 36.8 % (ref 34.8–46.6)
HEMOGLOBIN: 12.9 g/dL (ref 11.6–15.9)
LYMPH#: 1.7 10*3/uL (ref 0.9–3.3)
LYMPH%: 20.6 % (ref 14.0–49.7)
MCH: 32.7 pg (ref 25.1–34.0)
MCHC: 35.1 g/dL (ref 31.5–36.0)
MCV: 93.4 fL (ref 79.5–101.0)
MONO#: 0.7 10*3/uL (ref 0.1–0.9)
MONO%: 8.3 % (ref 0.0–14.0)
NEUT%: 60.5 % (ref 38.4–76.8)
NEUTROS ABS: 5.1 10*3/uL (ref 1.5–6.5)
Platelets: 166 10*3/uL (ref 145–400)
RBC: 3.94 10*6/uL (ref 3.70–5.45)
RDW: 13.3 % (ref 11.2–14.5)
WBC: 8.5 10*3/uL (ref 3.9–10.3)

## 2015-07-23 MED ORDER — SODIUM CHLORIDE 0.9 % IJ SOLN
10.0000 mL | INTRAMUSCULAR | Status: DC | PRN
  Administered 2015-07-23: 10 mL via INTRAVENOUS
  Filled 2015-07-23: qty 10

## 2015-07-23 MED ORDER — HEPARIN SOD (PORK) LOCK FLUSH 100 UNIT/ML IV SOLN
500.0000 [IU] | Freq: Once | INTRAVENOUS | Status: AC
  Administered 2015-07-23: 500 [IU] via INTRAVENOUS
  Filled 2015-07-23: qty 5

## 2015-07-24 LAB — CEA: CEA: 1.3 ng/mL (ref 0.0–5.0)

## 2015-07-26 NOTE — Telephone Encounter (Signed)
Call received from [facility] with Dr. Glenna Fellows in reference to referral.  "Dr. Carloyn Manner is out of office until 08-03-2015.  Our staff has looked at this patient's MRI.  This patient is probably in a lot of pain per the MRI.  Dr. Carloyn Manner will be glad to see her but it will be farther out.  We suggest Dr. Benay Spice send a new referral to new provider if patient needs to be seen before Dr. Ophelia Shoulder availbalility."  Will notify Dr.Sherrill.

## 2015-07-27 ENCOUNTER — Ambulatory Visit (HOSPITAL_COMMUNITY)
Admission: RE | Admit: 2015-07-27 | Discharge: 2015-07-27 | Disposition: A | Discharge status: Home / Self Care | Payer: 59 | Source: Ambulatory Visit | Attending: Nurse Practitioner | Admitting: Nurse Practitioner

## 2015-07-27 DIAGNOSIS — Z9049 Acquired absence of other specified parts of digestive tract: Secondary | ICD-10-CM | POA: Insufficient documentation

## 2015-07-27 DIAGNOSIS — C787 Secondary malignant neoplasm of liver and intrahepatic bile duct: Secondary | ICD-10-CM | POA: Diagnosis not present

## 2015-07-27 DIAGNOSIS — Z08 Encounter for follow-up examination after completed treatment for malignant neoplasm: Secondary | ICD-10-CM | POA: Diagnosis present

## 2015-07-27 DIAGNOSIS — C7800 Secondary malignant neoplasm of unspecified lung: Secondary | ICD-10-CM | POA: Diagnosis not present

## 2015-07-27 DIAGNOSIS — C180 Malignant neoplasm of cecum: Secondary | ICD-10-CM | POA: Diagnosis not present

## 2015-07-27 MED ORDER — IOHEXOL 300 MG/ML  SOLN
100.0000 mL | Freq: Once | INTRAMUSCULAR | Status: AC | PRN
  Administered 2015-07-27: 100 mL via INTRAVENOUS

## 2015-07-28 ENCOUNTER — Telehealth: Payer: Self-pay | Admitting: *Deleted

## 2015-07-28 MED ORDER — TRAMADOL HCL 50 MG PO TABS: 50.0000 mg | ORAL_TABLET | Freq: Four times a day (QID) | ORAL | Status: DC | PRN

## 2015-07-28 NOTE — Telephone Encounter (Signed)
Returned call to pt. Ultram prescribed by Dr. Benay Spice. Instructions reviewed. Pt reports she had been taking Ibuprofen with little relief. Concerned about CT result. Listened supportively, pt is scheduled for office visit 11/18.

## 2015-07-28 NOTE — Telephone Encounter (Signed)
Voicemail: "I need dr. Gearldine Shown nurse. I have backpain and would like a non-drowsy pain medication called in.  My PCP is out of town this week." Called patient due to F/U scheduled 07-30-2015. "I had MRI two weeks ago that showed a bulging disc.  I have an appointment with Dr. Carloyn Manner in [facility] August 12, 2015 but I need relief.  PCP gave me Ibuprofen and that's not rocking it.  I have a muscle relaxant to take at night.  I have percocet on hand but can't take this during there day and function."  Informed her I will document our call for provider review but he may have left the office for the day.  "I've waited and waited so please call if something can be called in.  Advised most pain medicines hard copy must be picked up and taken to pharmacy except for tramadol.  Asked if tramadol make you drowsy.  No further questions.

## 2015-07-30 ENCOUNTER — Encounter: Payer: Self-pay | Admitting: *Deleted

## 2015-07-30 ENCOUNTER — Ambulatory Visit (HOSPITAL_BASED_OUTPATIENT_CLINIC_OR_DEPARTMENT_OTHER): Payer: 59 | Admitting: Oncology

## 2015-07-30 ENCOUNTER — Telehealth: Payer: Self-pay | Admitting: Oncology

## 2015-07-30 VITALS — BP 131/94 | HR 85 | Temp 98.0°F | Resp 18 | Ht 66.0 in | Wt 152.0 lb

## 2015-07-30 DIAGNOSIS — C787 Secondary malignant neoplasm of liver and intrahepatic bile duct: Secondary | ICD-10-CM

## 2015-07-30 DIAGNOSIS — C772 Secondary and unspecified malignant neoplasm of intra-abdominal lymph nodes: Secondary | ICD-10-CM

## 2015-07-30 DIAGNOSIS — C182 Malignant neoplasm of ascending colon: Secondary | ICD-10-CM

## 2015-07-30 DIAGNOSIS — C7801 Secondary malignant neoplasm of right lung: Secondary | ICD-10-CM | POA: Diagnosis not present

## 2015-07-30 DIAGNOSIS — I808 Phlebitis and thrombophlebitis of other sites: Secondary | ICD-10-CM

## 2015-07-30 DIAGNOSIS — C7802 Secondary malignant neoplasm of left lung: Secondary | ICD-10-CM

## 2015-07-30 DIAGNOSIS — Z23 Encounter for immunization: Secondary | ICD-10-CM

## 2015-07-30 DIAGNOSIS — M5127 Other intervertebral disc displacement, lumbosacral region: Secondary | ICD-10-CM

## 2015-07-30 DIAGNOSIS — G620 Drug-induced polyneuropathy: Secondary | ICD-10-CM

## 2015-07-30 DIAGNOSIS — Z800 Family history of malignant neoplasm of digestive organs: Secondary | ICD-10-CM

## 2015-07-30 MED ORDER — PNEUMOCOCCAL 13-VAL CONJ VACC IM SUSP
0.5000 mL | Freq: Once | INTRAMUSCULAR | Status: AC
  Administered 2015-07-30: 0.5 mL via INTRAMUSCULAR
  Filled 2015-07-30: qty 0.5

## 2015-07-30 NOTE — Progress Notes (Signed)
Oncology Nurse Navigator Documentation  Oncology Nurse Navigator Flowsheets 07/30/2015  Navigator Encounter Type 6 month  Patient Visit Type Medonc  Treatment Phase Abnormal Scans--progression on CT  Barriers/Navigation Needs Family concerns---anxious; seeking alternative medicine  Interventions Supportive listening; stressed need to research people closely who make claims outside [facility] oncology.  Support Groups/Services -  Time Spent with Patient 30  MD suggesting FOLFIRI, but Davalyn is against anything that will make her lose her hair--asking about IV Vitamin C and other alternative treatments from a provider in [place] asking our opinion. Informed there is no objective data to prove this will cure her cancer or help decrease the growth. Does not sound harmful as long as she uses conventional therapy with the other treatment. Reminded her of the expense and that it would most likely not be covered by insurance. She is interested in Dr. Benay Spice looking to see if she would be eligible for any current trials at [facility]. Feeling well, so she wishes to return after Christmas and discuss this again.

## 2015-07-30 NOTE — Progress Notes (Signed)
[place] Work  Clinical Social Work was referred by patient for assessment of psychosocial needs.  Clinical Social Worker met with patient at length in [place] to offer support and assess for needs. Pt working through many questions regarding financial concerns, ADRs, alternative treatment and quality of life vs quantity. Pt really needed a listening ear. CSW provided objective listening. Pt asking questions/discussion held around options for end of life planning and how to get will, funeral plans in order. Pt not currently sure about her plans for her treatment, but appears to be proactively planning in order to not burden her son. Pt plans to reach out to Duanne Limerick for additional support in the weeks ahead. Pt aware to reach out to CSW as needed.   Clinical Social Work interventions: Supportive listening End of life planning Resource education Loren Racer, [facility] Worker [place]  [place] Phone: [phone] Fax: [phone]

## 2015-07-30 NOTE — Progress Notes (Signed)
[place] OFFICE PROGRESS NOTE   Diagnosis: Colon cancer  INTERVAL HISTORY:   Martha Becker returns as scheduled. She continues to have pain at the right buttock and right leg. We prescribed tramadol 07/28/2015. She is scheduled to see Dr. Carloyn Manner in approximately 2 weeks.  She otherwise feels well. Good appetite. She has mild neuropathy symptoms in the hands and feet. The numbness is worse in the right foot since developing the radicular pain. An MRI 07/13/2015 confirmed a disc protrusion at L5-S1 with mass effect on the right L5 nerve root.  Objective:  Vital signs in last 24 hours:  Blood pressure 131/94, pulse 85, temperature 98 F (36.7 C), temperature source Oral, resp. rate 18, height _0  (1.676 m), weight 152 lb (68.947 kg), SpO2 100 %.  Resp: Lungs with fine rales at the left posterior base, no respiratory distress Cardio: Regular rate and rhythm GI: No hepatomegaly, nontender, no mass Vascular: No leg edema  Portacath/PICC-without erythema  Lab Results:  Lab Results  Component Value Date   WBC 8.5 07/23/2015   HGB 12.9 07/23/2015   HCT 36.8 07/23/2015   MCV 93.4 07/23/2015   PLT 166 07/23/2015   NEUTROABS 5.1 07/23/2015      Lab Results  Component Value Date   CEA 1.3 07/23/2015    Imaging:  Ct Chest W Contrast  07/27/2015  CLINICAL DATA:  Colorectal carcinoma with lung metastasis and liver metastasis. Subsequent treatment evaluation. EXAM: CT CHEST, ABDOMEN, AND PELVIS WITH CONTRAST TECHNIQUE: Multidetector CT imaging of the chest, abdomen and pelvis was performed following the standard protocol during bolus administration of intravenous contrast. CONTRAST:  129m OMNIPAQUE IOHEXOL 300 MG/ML  SOLN COMPARISON:  CT 04/20/2015, 02/10/2015, PET-CT 09/22/2014 FINDINGS: CT CHEST FINDINGS Mediastinum/Nodes: No axillary supraclavicular lymphadenopathy. No mediastinal hilar lymphadenopathy. No pericardial fluid. Esophagus normal. Lungs/Pleura: Interval in  size of bilateral pulmonary nodular metastasis. For example 17 mm nodule in the LEFT lower lobe (image 45, series 7) is increased from 10 mm. Inferior LEFT lower lobe lesion measures 19 mm on image 57, series 7) compared to 11 mm. Within the RIGHT lower lobe, 11 mm lesion in azygoesophageal recess (image 45, series 7) increased from 3 mm on prior. There are approximately 20 lesions within each lung. All lesions are increased in size. Musculoskeletal: No aggressive osseous lesion. CT ABDOMEN AND PELVIS FINDINGS Hepatobiliary: Subcapsular lesion the RIGHT hepatic lobe measures 10 mm (image 61, series 2) compared to 12 mm on prior. Subtle lesion in the LEFT lateral hepatic lobe measures 14 mm (image 57, series 2) unchanged a 14 mm but is less conspicuous than on comparison. There are multiple simple fluid attenuation lesions likely representing benign cysts also unchanged. Pancreas: Pancreas is normal. No ductal dilatation. No pancreatic inflammation. Spleen: Normal spleen Adrenals/urinary tract: Adrenal glands and kidneys are normal. The ureters and bladder normal. Stomach/Bowel: Stomach and small bowel are normal. RIGHT hemicolectomy anatomy. No local recurrence at the anastomosis. Distal colon rectum normal. Vascular/Lymphatic: Abdominal aorta is normal caliber. There is no retroperitoneal or periportal lymphadenopathy. No pelvic lymphadenopathy. Small subcentimeter LEFT periaortic lymph nodes are slightly decreased in size. Example 7 mm lymph node image 72, series 2 decreased from 9 mm. 5 mm lesion on image 78, series 2 compares to 7 mm. Reproductive: Uterus and ovaries are normal. Other: No free fluid. Musculoskeletal: Small sclerotic lesion in the LEFT iliac bone new 4 mm on image 98, series 2 is present on comparison exams felt to be benign. More densely  sclerotic lesion in the RIGHT iliac bone on image 97 the has benign features. IMPRESSION: Chest Impression: 1. Unfortunately, there is progression of pulmonary  metastasis with enlargement of all of the pulmonary nodules. Approximately 20 nodules per lung. 2. No clear evidence of new pulmonary nodules. 3. No mediastinal lymphadenopathy Abdomen / Pelvis Impression: 1. Stable RIGHT metastasis which are slightly less conspicuous. 2. Small LEFT periaortic retroperitoneal lymph nodes are decreased in size. 3. Stable RIGHT hemicolectomy anatomy without evidence local recurrence. 4. No pelvic lymphadenopathy. 5. No evidence skeletal metastasis. Electronically Signed   By: Suzy Bouchard M.D.   On: 07/27/2015 12:12   Ct Abdomen Pelvis W Contrast  07/27/2015  CLINICAL DATA:  Colorectal carcinoma with lung metastasis and liver metastasis. Subsequent treatment evaluation. EXAM: CT CHEST, ABDOMEN, AND PELVIS WITH CONTRAST TECHNIQUE: Multidetector CT imaging of the chest, abdomen and pelvis was performed following the standard protocol during bolus administration of intravenous contrast. CONTRAST:  176mL OMNIPAQUE IOHEXOL 300 MG/ML  SOLN COMPARISON:  CT 04/20/2015, 02/10/2015, PET-CT 09/22/2014 FINDINGS: CT CHEST FINDINGS Mediastinum/Nodes: No axillary supraclavicular lymphadenopathy. No mediastinal hilar lymphadenopathy. No pericardial fluid. Esophagus normal. Lungs/Pleura: Interval in size of bilateral pulmonary nodular metastasis. For example 17 mm nodule in the LEFT lower lobe (image 45, series 7) is increased from 10 mm. Inferior LEFT lower lobe lesion measures 19 mm on image 57, series 7) compared to 11 mm. Within the RIGHT lower lobe, 11 mm lesion in azygoesophageal recess (image 45, series 7) increased from 3 mm on prior. There are approximately 20 lesions within each lung. All lesions are increased in size. Musculoskeletal: No aggressive osseous lesion. CT ABDOMEN AND PELVIS FINDINGS Hepatobiliary: Subcapsular lesion the RIGHT hepatic lobe measures 10 mm (image 61, series 2) compared to 12 mm on prior. Subtle lesion in the LEFT lateral hepatic lobe measures 14 mm (image  57, series 2) unchanged a 14 mm but is less conspicuous than on comparison. There are multiple simple fluid attenuation lesions likely representing benign cysts also unchanged. Pancreas: Pancreas is normal. No ductal dilatation. No pancreatic inflammation. Spleen: Normal spleen Adrenals/urinary tract: Adrenal glands and kidneys are normal. The ureters and bladder normal. Stomach/Bowel: Stomach and small bowel are normal. RIGHT hemicolectomy anatomy. No local recurrence at the anastomosis. Distal colon rectum normal. Vascular/Lymphatic: Abdominal aorta is normal caliber. There is no retroperitoneal or periportal lymphadenopathy. No pelvic lymphadenopathy. Small subcentimeter LEFT periaortic lymph nodes are slightly decreased in size. Example 7 mm lymph node image 72, series 2 decreased from 9 mm. 5 mm lesion on image 78, series 2 compares to 7 mm. Reproductive: Uterus and ovaries are normal. Other: No free fluid. Musculoskeletal: Small sclerotic lesion in the LEFT iliac bone new 4 mm on image 98, series 2 is present on comparison exams felt to be benign. More densely sclerotic lesion in the RIGHT iliac bone on image 97 the has benign features. IMPRESSION: Chest Impression: 1. Unfortunately, there is progression of pulmonary metastasis with enlargement of all of the pulmonary nodules. Approximately 20 nodules per lung. 2. No clear evidence of new pulmonary nodules. 3. No mediastinal lymphadenopathy Abdomen / Pelvis Impression: 1. Stable RIGHT metastasis which are slightly less conspicuous. 2. Small LEFT periaortic retroperitoneal lymph nodes are decreased in size. 3. Stable RIGHT hemicolectomy anatomy without evidence local recurrence. 4. No pelvic lymphadenopathy. 5. No evidence skeletal metastasis. Electronically Signed   By: Suzy Bouchard M.D.   On: 07/27/2015 12:12   CT images were reviewed with Martha Becker  Medications: I have  reviewed the patient's current medications.  Assessment/Plan: 1. Stage IV  (pT4b,pN1b,M1) moderately differentiated adenocarcinoma of the proximal ascending colon, status post a right colectomy 08/31/2014  no loss of mismatch repair protein expression, microsatellite stable  K-ras wild-type by standard testing,NRAS Q61K mutation identified on Foundation 1 testing  [facility] alteration detected  Staging CT scans December 2015 and PET scan 09/22/2014 consistent with metastatic bilateral lung nodules, metastatic retroperitoneal lymphadenopathy, and a probable left liver metastasis  Cycle 1 CAPOX 10/29/2014 (Xeloda discontinued day 9 secondary to diarrhea)   CT 11/14/2014 revealed progressive metastatic disease at the lung bases and liver compared to a PET scan from 09/22/2014, mild improvement in retroperitoneal lymphadenopathy  Cycle 1 FOLFOX 12/02/2014  Cycle 2 FOLFOX, Avastin added 12/16/2014  Cycle 3 FOLFOX/Avastin 12/30/2014  Cycle 4 FOLFOX/Avastin 01/13/2015  Cycle 5 FOLFOX/Avastin 01/27/2015  CT 02/10/2015 with improvement in hepatic metastases, mild improvement of lung metastases and retroperitoneal lymphadenopathy  Cycle 6 FOLFOX/Avastin 02/16/2015  Cycle 7 FOLFOX/Avastin 03/02/2015  Cycle 8 FOLFOX/Avastin 03/16/2015  Cycle 9 FOLFOX/Avastin 04/13/2015  Restaging CTs 04/20/2015 with a decrease in the size of pulmonary nodules, liver lesions, and retroperitoneal lymph nodes  Restaging CTs 07/27/2015 revealed enlargement of bilateral lung nodules, stable liver lesions, decreased periaortic adenopathy  2. Elevated TSH, PET scan 09/22/2014 with diffuse increased metabolic uptake in the thyroid   3. Family history of multiple cancers including colon cancer in her paternal grandfather and rectal cancer in a paternal first cousin  81. Admission 11/14/2014 with nausea and diarrhea, most likely secondary to capecitabine induced enteritis-resolved. CT 11/14/2014 consistent with ileitis  5.Superficial phlebitis left hand/wrist  12/30/2014.  6. Early oxaliplatin neuropathy  7.    Pain at the right gluteus and right leg-MRI of the lumbar spine 07/13/2015 confirmed a disc protrusion at L5-S1   Disposition:  I discussed the restaging CTs with Martha Becker and reviewed the images. There is significant progression of bilateral lung nodules. I recommend treatment with FOLFIRI/Avastin. She does not wish to begin chemotherapy at present. She states it would be very difficult for her to receive chemotherapy that can cause alopecia.  She has checked into several alternative medicine therapies. I explained there is no evidence these treatments (vitamin therapy etc.) will improve the colon cancer or her survival.  She does not wish to begin salvage therapy at present. She agrees to a follow-up visit with a Port-A-Cath flush 09/08/2015. I recommend a restaging chest CT in approximately 2 months. I offered her a second opinion and she declined this for now.  She will see Dr. Christy Sartorius for evaluation of the symptomatic L5-S1 disc protrusion.  Ms. Stcharles received a 13 valent pneumococcal vaccine today.  Martha Coder, MD  07/30/2015  11:35 AM

## 2015-07-30 NOTE — Telephone Encounter (Signed)
Gave patient avs report and appointments for December  °

## 2015-08-02 ENCOUNTER — Telehealth: Payer: Self-pay | Admitting: *Deleted

## 2015-08-02 NOTE — Telephone Encounter (Signed)
Oncology Nurse Navigator Documentation  Oncology Nurse Navigator Flowsheets 08/02/2015  Navigator Encounter Type Telephone  Patient Visit Type -  Treatment Phase -  Barriers/Navigation Needs Family concerns--Hair loss with FOLFIRI; Clinical Trials; Rate of cancer progression; Adverse effects from chemo; scans  Interventions Other--supportive listening & education  Support Groups/Services Requested copy of wig shops from CSW to mail to [place]  Time Spent with Patient 30  Called to discuss statistics on hair loss with FOLFIRI-asking about wigs and venting that she feels she could not emotionally deal with her hair coming out. Asking how long could she wait to resume the prior chemo treatment FOLFOX? Asking to be told how fast her cancer will progress over the next few months-told her no one can predict this with 100 %accuracy. Asking why he is doing only the CT of her chest in the future and not entire body and why no contrast? Explained that contrast is not needed to follow the chest disease and he is only doing the chest because this is where her progression is being watched. If she develops any progression in her liver, her lab work will clue [place] in to this. Asking how does MD know how many cycles of chemo to give? Made her aware that clinical trials have helped develop the standards of care. Just as trials can determine how many days and what dose of antibiotic is requires to treat infections. She is concerned her diet may not be adequate--she plans on calling to dietician to discuss this (has her card). Wants MD aware that she still wants him to see if there are any clinical trials at [facility] she may qualify for.

## 2015-08-03 ENCOUNTER — Telehealth: Payer: Self-pay | Admitting: *Deleted

## 2015-08-03 NOTE — Telephone Encounter (Signed)
Received a fax from [facility], potential drug interaction between Tramadol and Flexeril. Reviewed with Dr. Benay Spice: Pt to discontinue Flexeril. Left message on voicemail for pt to call office.

## 2015-08-04 NOTE — Telephone Encounter (Signed)
Pt returned call, instructed her to discontinue Flexeril. She reports she has not taken this in a while since it didn't help. Pt had several questions about chemo side effects which were answered.  Pt asks to speak with a pt who is undergoing chemo. Request forwarded to GI navigator to facilitate this.

## 2015-08-11 ENCOUNTER — Encounter: Payer: Self-pay | Admitting: *Deleted

## 2015-08-11 NOTE — Progress Notes (Signed)
[place] Work  Clinical Social Work was referred by patient for assessment of psychosocial needs due to issues with affording medications.  Clinical Social Worker phoned pt at home and had to leave message explaining possible options. CSW awaits return call and will assist further once more details are available. Pt does not return to [facility] until late Dec.   Martha Becker, [place] Worker [place]  [facility] Phone: [phone] Fax: [phone]

## 2015-08-13 ENCOUNTER — Telehealth: Payer: Self-pay | Admitting: *Deleted

## 2015-08-13 NOTE — Telephone Encounter (Signed)
  Oncology Nurse Navigator Documentation    Navigator Encounter Type: Telephone (08/13/15 1629)  Patient had left VM 08/12/15 requesting return call from navigator about chemotherapy suggestions from MD and having back surgery and to talk with someone going through chemotherapy. Left VM that I will attempt to reach her next week. Sending information via MyChart regarding website [email or web address] that has a lot of resources and a [employer] she may be interested in.

## 2015-08-18 ENCOUNTER — Telehealth: Payer: Self-pay | Admitting: *Deleted

## 2015-08-18 NOTE — Telephone Encounter (Signed)
Pt left voice message request office to call her re: questions/concerns she has.  Attempted to call pt and left voice message for her to return call to office.

## 2015-08-30 ENCOUNTER — Telehealth: Payer: Self-pay | Admitting: *Deleted

## 2015-08-30 NOTE — Telephone Encounter (Signed)
Call received from patient "asking to speak with navigator or who ever can help with letter for work.  Dr, Benay Spice said whatever is needed he can help.   My employer wants me to return to work September 11, 2015.  I've been out a year with colon cancer that has metastasized to lung and liver.  I know I am to restart chemotherapy and need a letter that says I cannot return to work and need to be out as long as possible.  I do not want to quit and they must draw their hand.  I worked part time, currently drawing S[employer]I and do not have disability.  I've called HR and no return call received from Jaquelyn Bitter in HR yet."

## 2015-08-30 NOTE — Telephone Encounter (Signed)
This request was sent to physician.

## 2015-08-30 NOTE — Telephone Encounter (Signed)
Left voice message for pt that MD is aware of request and will discuss at MD appt on 09/08/15.  Any further questions, please call office.

## 2015-09-08 ENCOUNTER — Ambulatory Visit (HOSPITAL_BASED_OUTPATIENT_CLINIC_OR_DEPARTMENT_OTHER): Payer: 59

## 2015-09-08 ENCOUNTER — Ambulatory Visit (HOSPITAL_BASED_OUTPATIENT_CLINIC_OR_DEPARTMENT_OTHER): Payer: 59 | Admitting: Oncology

## 2015-09-08 ENCOUNTER — Encounter: Payer: Self-pay | Admitting: *Deleted

## 2015-09-08 ENCOUNTER — Other Ambulatory Visit: Payer: Self-pay | Admitting: *Deleted

## 2015-09-08 VITALS — BP 120/73 | HR 82 | Temp 98.0°F | Resp 18 | Ht 66.0 in | Wt 158.1 lb

## 2015-09-08 DIAGNOSIS — C787 Secondary malignant neoplasm of liver and intrahepatic bile duct: Secondary | ICD-10-CM | POA: Diagnosis not present

## 2015-09-08 DIAGNOSIS — Z95828 Presence of other vascular implants and grafts: Secondary | ICD-10-CM

## 2015-09-08 DIAGNOSIS — C180 Malignant neoplasm of cecum: Secondary | ICD-10-CM

## 2015-09-08 DIAGNOSIS — G620 Drug-induced polyneuropathy: Secondary | ICD-10-CM

## 2015-09-08 DIAGNOSIS — R591 Generalized enlarged lymph nodes: Secondary | ICD-10-CM | POA: Diagnosis not present

## 2015-09-08 DIAGNOSIS — M5127 Other intervertebral disc displacement, lumbosacral region: Secondary | ICD-10-CM

## 2015-09-08 DIAGNOSIS — C7800 Secondary malignant neoplasm of unspecified lung: Secondary | ICD-10-CM

## 2015-09-08 MED ORDER — HEPARIN SOD (PORK) LOCK FLUSH 100 UNIT/ML IV SOLN
500.0000 [IU] | Freq: Once | INTRAVENOUS | Status: AC
Start: 2015-09-08 — End: 2015-09-08
  Administered 2015-09-08: 500 [IU] via INTRAVENOUS
  Filled 2015-09-08: qty 5

## 2015-09-08 MED ORDER — SODIUM CHLORIDE 0.9 % IJ SOLN
10.0000 mL | INTRAMUSCULAR | Status: DC | PRN
  Administered 2015-09-08: 10 mL via INTRAVENOUS
  Filled 2015-09-08: qty 10

## 2015-09-08 MED ORDER — TRAMADOL HCL 50 MG PO TABS: 50.0000 mg | ORAL_TABLET | Freq: Four times a day (QID) | ORAL | Status: DC | PRN

## 2015-09-08 NOTE — Patient Instructions (Signed)

## 2015-09-08 NOTE — Progress Notes (Signed)
  [place] OFFICE PROGRESS NOTE   Diagnosis: Colon cancer  INTERVAL HISTORY:   Ms. Vandervoort returns as scheduled. She reports feeling well. Good appetite. She felt better after discontinuing thyroid hormone. She continues to feel very anxious about the diagnosis of metastatic colon cancer and treatment options. I spoke with her by telephone earlier this month. The right leg pain has improved.   Objective:  Vital signs in last 24 hours:  Blood pressure 120/73, pulse 82, temperature 98 F (36.7 C), temperature source Oral, resp. rate 18, height _0  (1.676 m), weight 158 lb 1.6 oz (71.714 kg), SpO2 100 %.    HEENT: Neck without mass Resp: Lungs clear bilaterally Cardio: Regular rate and rhythm GI: No hepatomegaly, nontender Vascular: No leg edema  Portacath/PICC-without erythema  Lab Results:  Lab Results  Component Value Date   WBC 8.5 07/23/2015   HGB 12.9 07/23/2015   HCT 36.8 07/23/2015   MCV 93.4 07/23/2015   PLT 166 07/23/2015   NEUTROABS 5.1 07/23/2015    Lab Results  Component Value Date   CEA 1.3 07/23/2015    Medications: I have reviewed the patient's current medications.  Assessment/Plan: 1. 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, status post a right colectomy 08/31/2014  no loss of mismatch repair protein expression, microsatellite stable  K-ras wild-type by standard testing,NRAS Q61K mutation identified on Foundation 1 testing  APC alteration detected  Staging CT scans December 2015 and PET scan 09/22/2014 consistent with metastatic bilateral lung nodules, metastatic retroperitoneal lymphadenopathy, and a probable left liver metastasis  Cycle 1 CAPOX 10/29/2014 (Xeloda discontinued day 9 secondary to diarrhea)   CT 11/14/2014 revealed progressive metastatic disease at the lung bases and liver compared to a PET scan from 09/22/2014, mild improvement in retroperitoneal  lymphadenopathy  Cycle 1 FOLFOX 12/02/2014  Cycle 2 FOLFOX, Avastin added 12/16/2014  Cycle 3 FOLFOX/Avastin 12/30/2014  Cycle 4 FOLFOX/Avastin 01/13/2015  Cycle 5 FOLFOX/Avastin 01/27/2015  CT 02/10/2015 with improvement in hepatic metastases, mild improvement of lung metastases and retroperitoneal lymphadenopathy  Cycle 6 FOLFOX/Avastin 02/16/2015  Cycle 7 FOLFOX/Avastin 03/02/2015  Cycle 8 FOLFOX/Avastin 03/16/2015  Cycle 9 FOLFOX/Avastin 04/13/2015  Restaging CTs 04/20/2015 with a decrease in the size of pulmonary nodules, liver lesions, and retroperitoneal lymph nodes  Restaging CTs 07/27/2015 revealed enlargement of bilateral lung nodules, stable liver lesions, decreased periaortic adenopathy  2. Elevated TSH, PET scan 09/22/2014 with diffuse increased metabolic uptake in the thyroid   3. Family history of multiple cancers including colon cancer in her paternal grandfather and rectal cancer in a paternal first cousin  85. Admission 11/14/2014 with nausea and diarrhea, most likely secondary to capecitabine induced enteritis-resolved. CT 11/14/2014 consistent with ileitis  5.Superficial phlebitis left hand/wrist 12/30/2014.  6. Early oxaliplatin neuropathy  7. Pain at the right gluteus and right leg-MRI of the lumbar spine 07/13/2015 confirmed a disc protrusion at L5-S1     Disposition:  Ms. Neider appears stable. We had a long discussion regarding treatment options. She agrees to a restaging CT evaluation in approximately one month. I recommend FOLFIRI/Avastin chemotherapy if there is significant disease progression. We again reviewed the potential toxicities associated with FOLFIRI regimen. I offered to refer her back to [facility] for a second opinion. She declines this at present.  She will return for an office visit 10/07/2015.    Betsy Coder, MD  09/08/2015  11:04 AM

## 2015-09-08 NOTE — Progress Notes (Signed)
Oncology Nurse Navigator Documentation  Oncology Nurse Navigator Flowsheets 09/08/2015  Navigator Encounter Type Other-Discuss next steps in treatment  Patient Visit Type Medonc  Treatment Phase Other  Barriers/Navigation Needs Education;Family concerns;Financial  Education Understanding  Treatment Options;Concerns with Insurance Coverage;Concerns with Finances/ Eligibility  Interventions Other-supportive listening/answer questions/requested MD dictate letter that she is not able to work for at least another 5-6 months due to medical condition and treatment  Support Groups/Services GI  Time Spent with Patient 47  Had many questions and wants specific answers. Has trouble understanding that we can't predict how long her remission will be or how long she will live with certainty. She wants certainty in everything. Again does not want to apply for medicaid. Has called her HR department several times with no return call. She thinks their policy is if you can't work there anymore due to medical condition they will allow you to have [employer] insurance with same cost as if you were still employed there for 18 months. She will continue to follow up on this. Wants letter from Dr. Benay Spice mailed to her HR department and copy for her.

## 2015-09-10 ENCOUNTER — Encounter: Payer: Self-pay | Admitting: Oncology

## 2015-10-05 ENCOUNTER — Ambulatory Visit (HOSPITAL_COMMUNITY)
Admission: RE | Admit: 2015-10-05 | Discharge: 2015-10-05 | Disposition: A | Discharge status: Home / Self Care | Payer: 59 | Source: Ambulatory Visit | Attending: Oncology | Admitting: Oncology

## 2015-10-05 DIAGNOSIS — Z9049 Acquired absence of other specified parts of digestive tract: Secondary | ICD-10-CM | POA: Diagnosis not present

## 2015-10-05 DIAGNOSIS — C180 Malignant neoplasm of cecum: Secondary | ICD-10-CM | POA: Diagnosis not present

## 2015-10-05 DIAGNOSIS — C787 Secondary malignant neoplasm of liver and intrahepatic bile duct: Secondary | ICD-10-CM | POA: Diagnosis not present

## 2015-10-05 DIAGNOSIS — Z08 Encounter for follow-up examination after completed treatment for malignant neoplasm: Secondary | ICD-10-CM | POA: Diagnosis present

## 2015-10-05 DIAGNOSIS — Z452 Encounter for adjustment and management of vascular access device: Secondary | ICD-10-CM | POA: Insufficient documentation

## 2015-10-05 DIAGNOSIS — C7800 Secondary malignant neoplasm of unspecified lung: Secondary | ICD-10-CM | POA: Insufficient documentation

## 2015-10-05 DIAGNOSIS — R590 Localized enlarged lymph nodes: Secondary | ICD-10-CM | POA: Insufficient documentation

## 2015-10-05 MED ORDER — IOHEXOL 300 MG/ML  SOLN
100.0000 mL | Freq: Once | INTRAMUSCULAR | Status: AC | PRN
  Administered 2015-10-05: 100 mL via INTRAVENOUS

## 2015-10-05 MED ORDER — HEPARIN SOD (PORK) LOCK FLUSH 100 UNIT/ML IV SOLN
500.0000 [IU] | Freq: Once | INTRAVENOUS | Status: AC
  Administered 2015-10-05: 500 [IU] via INTRAVENOUS

## 2015-10-05 MED ORDER — HEPARIN SOD (PORK) LOCK FLUSH 100 UNIT/ML IV SOLN
INTRAVENOUS | Status: AC
  Filled 2015-10-05: qty 5

## 2015-10-05 MED ORDER — HEPARIN SODIUM (PORCINE) 1000 UNIT/ML IJ SOLN: Freq: Once | INTRAMUSCULAR | Status: DC

## 2015-10-07 ENCOUNTER — Encounter: Payer: Self-pay | Admitting: *Deleted

## 2015-10-07 ENCOUNTER — Telehealth: Payer: Self-pay | Admitting: Oncology

## 2015-10-07 ENCOUNTER — Telehealth: Payer: Self-pay | Admitting: *Deleted

## 2015-10-07 ENCOUNTER — Ambulatory Visit (HOSPITAL_BASED_OUTPATIENT_CLINIC_OR_DEPARTMENT_OTHER): Payer: 59 | Admitting: Oncology

## 2015-10-07 VITALS — BP 120/69 | HR 83 | Temp 98.4°F | Resp 18 | Ht 66.0 in | Wt 155.8 lb

## 2015-10-07 DIAGNOSIS — C182 Malignant neoplasm of ascending colon: Secondary | ICD-10-CM

## 2015-10-07 DIAGNOSIS — C787 Secondary malignant neoplasm of liver and intrahepatic bile duct: Secondary | ICD-10-CM | POA: Diagnosis not present

## 2015-10-07 DIAGNOSIS — C7800 Secondary malignant neoplasm of unspecified lung: Secondary | ICD-10-CM

## 2015-10-07 DIAGNOSIS — C180 Malignant neoplasm of cecum: Secondary | ICD-10-CM

## 2015-10-07 NOTE — Progress Notes (Signed)
[place] OFFICE PROGRESS NOTE   Diagnosis:  Colon cancer  INTERVAL HISTORY:    [name] returns for a scheduled visit. Good appetite and energy level. She has a nonproductive cough and mild exertional dyspnea. The cough has been better for the past few days.   Objective:  Vital signs in last 24 hours:  Blood pressure 120/69, pulse 83, temperature 98.4 F (36.9 C), temperature source Oral, resp. rate 18, height _0  (1.676 m), weight 155 lb 12.8 oz (70.67 kg), SpO2 99 %.    HEENT:  No thrush or ulcers, neck without mass Lymphatics:  No cervical or supra-clavicular nodes Resp:  Coarse rales at the left posterior base, no respiratory distress Cardio:  Regular rate and rhythm GI:  No hepatosplenomegaly, no mass, nontender Vascular:  No leg edema Neuro: alert and oriented   Portacath/PICC-without erythema  Lab Results:  Lab Results  Component Value Date   WBC 8.5 07/23/2015   HGB 12.9 07/23/2015   HCT 36.8 07/23/2015   MCV 93.4 07/23/2015   PLT 166 07/23/2015   NEUTROABS 5.1 07/23/2015    Lab Results  Component Value Date   CEA 1.3 07/23/2015    Imaging:  Ct Chest W Contrast  10/05/2015  CLINICAL DATA:  Subsequent evaluation of a 60 year old female with history of colon cancer status post surgical resection. Lung metastasis. EXAM: CT CHEST, ABDOMEN, AND PELVIS WITH CONTRAST TECHNIQUE: Multidetector CT imaging of the chest, abdomen and pelvis was performed following the standard protocol during bolus administration of intravenous contrast. CONTRAST:  176m OMNIPAQUE IOHEXOL 300 MG/ML  SOLN COMPARISON:  Multiple priors, most recently CT of the chest, abdomen and pelvis 07/27/2015. FINDINGS: CT CHEST FINDINGS Mediastinum/Lymph Nodes: Heart size is normal. There is no significant pericardial fluid, thickening or pericardial calcification. No pathologically enlarged mediastinal or hilar lymph nodes. Esophagus is unremarkable in appearance. No axillary  lymphadenopathy. Right subclavian single-lumen porta cath with tip terminating in the distal superior vena cava. Lungs/Pleura: Interval enlargement of multiple previously noted pulmonary nodules and masses, largest of which is in the periphery of the left lower lobe (image 41 of series 3), currently measuring 3.9 x 2.4 cm (previously 2.9 x 1.8 cm on prior study 07/27/2015). Another notable mass is in the inferior aspect of the right lower lobe (image 49 of series 3) where there is a macrolobulated 3.5 x 2.2 cm mass which previously measured only 1.6 x 1.1 cm on 07/27/2015. Several new pulmonary nodules are noted, including a 5 mm nodule in the medial left lower lobe (image 51 of series 3), and a 7 mm nodule in the lateral right lower lobe (image 50 of series 3). No acute consolidative airspace disease. No pleural effusions. Musculoskeletal/Soft Tissues: There are no aggressive appearing lytic or blastic lesions noted in the visualized portions of the skeleton. CT ABDOMEN AND PELVIS FINDINGS Hepatobiliary: Interval enlargement of a hypovascular mass in segment 2 of the liver (image 55 of series 2), which currently measures 3.6 x 2.7 cm (previously 1.4 cm on prior study 07/27/2015). Likewise, previously noted hypovascular lesion in segment 7 of the liver (image 58 of series 2) is also enlarged, measuring 2.4 cm (previously 1.0 cm). Other previously noted subtle hypovascular lesion in segment 4B has significantly increased from barely perceptible to 1.7 x 1.3 cm (image 63 of series 2). New hypovascular lesion in inferior aspect of segment 5 measuring 1.3 cm (image 73 of series 2). Several other well-defined low-attenuation hepatic lesions appear unchanged in size, number  and distribution, presumably small cysts. No intra or extrahepatic biliary ductal dilatation. Gallbladder is normal in appearance. Pancreas: No pancreatic mass. No pancreatic ductal dilatation. No pancreatic or peripancreatic fluid or inflammatory  changes. Spleen: Unremarkable. Adrenals/Urinary Tract: Bilateral adrenal glands and bilateral kidneys are normal in appearance. No hydroureteronephrosis. Urinary bladder is normal in appearance. Stomach/Bowel: Normal appearance of the stomach. No pathologic dilatation of small bowel or colon. Status post right hemicolectomy. Vascular/Lymphatic: Minimal atherosclerosis in the abdominal and pelvic vasculature, without evidence of aneurysm or dissection. Interval enlargement of several retroperitoneal lymph nodes, largest of which is in the left para-aortic nodal station measuring 1 cm in short axis. Reproductive: Uterus and ovaries are unremarkable in appearance. Other: No significant volume of ascites.  No pneumoperitoneum. Musculoskeletal: There are no aggressive appearing lytic or blastic lesions noted in the visualized portions of the skeleton. IMPRESSION: 1. Today's study demonstrates progressive metastatic disease to the lungs and liver, as detailed above. In addition, there is increasing lymphadenopathy in the retroperitoneum. 2. Additional incidental findings, as above, similar prior examinations. Electronically Signed   By: Vinnie Langton M.D.   On: 10/05/2015 12:19   Ct Abdomen Pelvis W Contrast  10/05/2015  CLINICAL DATA:  Subsequent evaluation of a 60 year old female with history of colon cancer status post surgical resection. Lung metastasis. EXAM: CT CHEST, ABDOMEN, AND PELVIS WITH CONTRAST TECHNIQUE: Multidetector CT imaging of the chest, abdomen and pelvis was performed following the standard protocol during bolus administration of intravenous contrast. CONTRAST:  180m OMNIPAQUE IOHEXOL 300 MG/ML  SOLN COMPARISON:  Multiple priors, most recently CT of the chest, abdomen and pelvis 07/27/2015. FINDINGS: CT CHEST FINDINGS Mediastinum/Lymph Nodes: Heart size is normal. There is no significant pericardial fluid, thickening or pericardial calcification. No pathologically enlarged mediastinal or hilar  lymph nodes. Esophagus is unremarkable in appearance. No axillary lymphadenopathy. Right subclavian single-lumen porta cath with tip terminating in the distal superior vena cava. Lungs/Pleura: Interval enlargement of multiple previously noted pulmonary nodules and masses, largest of which is in the periphery of the left lower lobe (image 41 of series 3), currently measuring 3.9 x 2.4 cm (previously 2.9 x 1.8 cm on prior study 07/27/2015). Another notable mass is in the inferior aspect of the right lower lobe (image 49 of series 3) where there is a macrolobulated 3.5 x 2.2 cm mass which previously measured only 1.6 x 1.1 cm on 07/27/2015. Several new pulmonary nodules are noted, including a 5 mm nodule in the medial left lower lobe (image 51 of series 3), and a 7 mm nodule in the lateral right lower lobe (image 50 of series 3). No acute consolidative airspace disease. No pleural effusions. Musculoskeletal/Soft Tissues: There are no aggressive appearing lytic or blastic lesions noted in the visualized portions of the skeleton. CT ABDOMEN AND PELVIS FINDINGS Hepatobiliary: Interval enlargement of a hypovascular mass in segment 2 of the liver (image 55 of series 2), which currently measures 3.6 x 2.7 cm (previously 1.4 cm on prior study 07/27/2015). Likewise, previously noted hypovascular lesion in segment 7 of the liver (image 58 of series 2) is also enlarged, measuring 2.4 cm (previously 1.0 cm). Other previously noted subtle hypovascular lesion in segment 4B has significantly increased from barely perceptible to 1.7 x 1.3 cm (image 63 of series 2). New hypovascular lesion in inferior aspect of segment 5 measuring 1.3 cm (image 73 of series 2). Several other well-defined low-attenuation hepatic lesions appear unchanged in size, number and distribution, presumably small cysts. No intra or extrahepatic biliary  ductal dilatation. Gallbladder is normal in appearance. Pancreas: No pancreatic mass. No pancreatic ductal  dilatation. No pancreatic or peripancreatic fluid or inflammatory changes. Spleen: Unremarkable. Adrenals/Urinary Tract: Bilateral adrenal glands and bilateral kidneys are normal in appearance. No hydroureteronephrosis. Urinary bladder is normal in appearance. Stomach/Bowel: Normal appearance of the stomach. No pathologic dilatation of small bowel or colon. Status post right hemicolectomy. Vascular/Lymphatic: Minimal atherosclerosis in the abdominal and pelvic vasculature, without evidence of aneurysm or dissection. Interval enlargement of several retroperitoneal lymph nodes, largest of which is in the left para-aortic nodal station measuring 1 cm in short axis. Reproductive: Uterus and ovaries are unremarkable in appearance. Other: No significant volume of ascites.  No pneumoperitoneum. Musculoskeletal: There are no aggressive appearing lytic or blastic lesions noted in the visualized portions of the skeleton. IMPRESSION: 1. Today's study demonstrates progressive metastatic disease to the lungs and liver, as detailed above. In addition, there is increasing lymphadenopathy in the retroperitoneum. 2. Additional incidental findings, as above, similar prior examinations. Electronically Signed   By: Vinnie Langton M.D.   On: 10/05/2015 12:19    images reviewed Medications: I have reviewed the patient's current medications.  Assessment/Plan: 1. 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, status post a right colectomy 08/31/2014  no loss of mismatch repair protein expression, microsatellite stable  K-ras wild-type by standard testing,NRAS Q61K mutation identified on Foundation 1 testing  APC alteration detected  Staging CT scans December 2015 and PET scan 09/22/2014 consistent with metastatic bilateral lung nodules, metastatic retroperitoneal lymphadenopathy, and a probable left liver metastasis  Cycle 1 CAPOX 10/29/2014 (Xeloda discontinued day 9  secondary to diarrhea)   CT 11/14/2014 revealed progressive metastatic disease at the lung bases and liver compared to a PET scan from 09/22/2014, mild improvement in retroperitoneal lymphadenopathy  Cycle 1 FOLFOX 12/02/2014  Cycle 2 FOLFOX, Avastin added 12/16/2014  Cycle 3 FOLFOX/Avastin 12/30/2014  Cycle 4 FOLFOX/Avastin 01/13/2015  Cycle 5 FOLFOX/Avastin 01/27/2015  CT 02/10/2015 with improvement in hepatic metastases, mild improvement of lung metastases and retroperitoneal lymphadenopathy  Cycle 6 FOLFOX/Avastin 02/16/2015  Cycle 7 FOLFOX/Avastin 03/02/2015  Cycle 8 FOLFOX/Avastin 03/16/2015  Cycle 9 FOLFOX/Avastin 04/13/2015  Restaging CTs 04/20/2015 with a decrease in the size of pulmonary nodules, liver lesions, and retroperitoneal lymph nodes  Restaging CTs 07/27/2015 revealed enlargement of bilateral lung nodules, stable liver lesions, decreased periaortic adenopathy   CTs 10/05/2015 revealed further enlargement of bilateral lung nodules ,enlargement of liver lesions , and enlargement of peritoneal lymph nodes   Disposition:   [name] has  progressivemetastatic colon cancer. I discussed the CT findings with [name]. The tumor is RAS mutated. I recommend treatment with FOLFIRI/Avastin. We had a long discussion regarding the risks/benefits of salvage systemic therapy. I explained that patients who receive all of the available systemic therapy agents for colon cancer live longer on average.   We reviewed the potential toxicities associated with the FOLFIRI regimen in detail. She understands the potential for nausea/vomiting, mucositis, diarrhea, alopecia, and hematologic toxicity. We discussed the acute and delayed diarrhea associated with irinotecan. We reviewed the potential toxicities associated with Avastin. She agrees to proceed.   The plan is to obtain restaging CTs after 5 cycles of FOLFIRI/Avastin.     approximately 40 minutes were spent with patient today.  The majority of the time was used for counseling and coordination of care.  Betsy Coder, MD  10/07/2015  12:14 PM

## 2015-10-07 NOTE — Progress Notes (Signed)
[place] Work  Clinical Social Work was referred by need for pt follow up and for re-assessment of psychosocial needs.  Clinical Social Worker met with patient prior to appt with MD to offer support and assess for needs.  Pt very tearful today and really struggling with what to do about her cancer treatment. CSW provided safe, supportive space for pt to vent and express feelings. Pt has attended two groups at [facility], but did not find them very helpful. She has received financial support from them and was very [employer] attorney. Pt open to additional support here at [facility] and CSW to make referral to counseling interns. CSW will follow and continue to support pt.   Loren Racer, [place] Worker [place]  [place] Phone: [phone] Fax: [phone]

## 2015-10-07 NOTE — Telephone Encounter (Signed)
Pt confirmed labs/ov per 01/26 POF, gave pt AVS and Calendar.Cherylann Banas, sent msg to add chemo

## 2015-10-07 NOTE — Telephone Encounter (Signed)
Per staff message and POF I have scheduled appts. Advised scheduler of appts and to move labs. JMW  

## 2015-10-07 NOTE — Progress Notes (Signed)
Oncology Nurse Navigator Documentation  Oncology Nurse Navigator Flowsheets 10/07/2015  Navigator Location [facility]Med Onc  Navigator Encounter Type Follow-up Appt  Patient Visit Type MedOnc  Treatment Phase Abnormal Scans  Barriers/Navigation Needs Family concerns  Education UnderstandingTreatment Options;Coping with Diagnosis/ Prognosis;Preparing for Upcoming  Treatment;Concerns with Finances  Interventions Referrals;Education Method;Other--supportive listening;Provided info on [employer]  Referrals Counseling Support Services-left VM for Vaughan Sine to contact patient  Education Method Verbal;Written--Irinotecan and use of Imodium for diarrhea  Support Groups/Services -  Acuity Level 3  Time Spent with Patient 62  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 Wants absolute answers to questions regarding her life expectancy with and without treatment and if it will affect her quality of life, yet her quality of life is not at its best now. Son is in [place], her sister is close, but she does not want to intrude on her too much. Her friends all have families and are busy. Very frustrated and sad that all the plans she made for her life have been ruined by cancer. Does not have a faith base and is not receptive to spiritual appeal to God/higher power. Says she has little joy in her life-strongly encouraged her to at least have one appointment with our counselor to see if she can help her redirect her thoughts to look for more joy and handle her stress. She reluctantly agreed to allow counselor to call her for an appointment.

## 2015-10-08 ENCOUNTER — Telehealth: Payer: Self-pay

## 2015-10-08 ENCOUNTER — Telehealth: Payer: Self-pay | Admitting: Nurse Practitioner

## 2015-10-08 NOTE — Telephone Encounter (Signed)
Pt called stating she saw Dr Benay Spice yesterday and he asked her to call if she has any questions. She is requesting a call b/c she does have more questions. Surveyed pt chart and forwarded message.

## 2015-10-08 NOTE — Telephone Encounter (Addendum)
Returned call to pt, she wants to know: What is the likelihood of getting in remission? If she goes into remission, how long (based on what Dr. Benay Spice has seen) is it likely to stay in remission?  Pt had several questions about maintenance chemo. How long do you have to stay on maintenance? Do you get a break?  Pt has questions about peripheral neuropathy, in case she switches chemo back to [place] in the future. What percentage of people have it resolve completely after a year?  Should I see an endocrinologist since thyroid level was out of wack? How can I be sure that I don't have thyroid cancer since it lit up on the PET?   Informed pt I will forward questions to MD, will call Mon with a response. She voiced understanding.

## 2015-10-08 NOTE — Progress Notes (Unsigned)
Counseling Intern Note:  Spoke with Pt over the phone regarding available counseling services at [facility] per referral from Merceda Elks, RN . Pt stated that, due to living out of town, additional appointments at the cancer center for in-person counseling may be burdening at this time.  Pt and Counseling Intern agreed to regular phone calls for counseling services rather than in person meetings.  Pt sounded tearful over the phone. Pt and counseling intern discussed Pt's emotional state, current concerns, decisions around health and wellness, and possible options for the future.  Counseling intern provided open and supportive space for processing emotions and collaborated with client in clarifying additional questions she might have for her treatment team and in processing her decisions for future treatment.   FU:  Counseling Intern will call Pt again on 10/15/15

## 2015-10-08 NOTE — Telephone Encounter (Signed)
Lft msg for pt confirming labs/flush to be moved down due to chemo time.... KJ

## 2015-10-11 ENCOUNTER — Telehealth: Payer: Self-pay | Admitting: *Deleted

## 2015-10-11 NOTE — Telephone Encounter (Signed)
Received fax from on-call service stating pt is requesting medical marijuana. Returned call to pt, informed her there is a marijuana derivative that is approved for anorexia and nausea. If she has these issues on chemo, we can try that.  Dr. Benay Spice recommends an antidepressant. Pt is hesitant to start one but has heard of Lexapro and may be interested. She will let us know. Discussed a psych referral. Pt states she does not want to see another doctor at this time. She doesn't think it'll help. In answer to her questions from 1/27: Per Dr. Benay Spice: There's a 25-30% chance of 50% reduction in the cancer. Will discuss any plans for maintenance chemo when it comes to that. Likely 6 mos and take a break when pt needs it.  Approximately 1-2% of patients still have neuropathy 2 years from completion of Oxaliplatin.  Discussed the above. Pt stated she will continue with counseling at [facility] for now. She will talk to her sister about antidepressant and will make a decision tomorrow. Pt stated she will "try chemo and see what happens."

## 2015-10-12 ENCOUNTER — Encounter: Payer: Self-pay | Admitting: *Deleted

## 2015-10-12 ENCOUNTER — Ambulatory Visit (HOSPITAL_BASED_OUTPATIENT_CLINIC_OR_DEPARTMENT_OTHER): Payer: 59

## 2015-10-12 ENCOUNTER — Ambulatory Visit: Payer: 59

## 2015-10-12 VITALS — BP 118/60 | HR 86 | Temp 97.8°F | Resp 18

## 2015-10-12 DIAGNOSIS — Z5112 Encounter for antineoplastic immunotherapy: Secondary | ICD-10-CM | POA: Diagnosis not present

## 2015-10-12 DIAGNOSIS — C180 Malignant neoplasm of cecum: Secondary | ICD-10-CM

## 2015-10-12 DIAGNOSIS — C7800 Secondary malignant neoplasm of unspecified lung: Secondary | ICD-10-CM

## 2015-10-12 DIAGNOSIS — Z5111 Encounter for antineoplastic chemotherapy: Secondary | ICD-10-CM | POA: Diagnosis not present

## 2015-10-12 DIAGNOSIS — C182 Malignant neoplasm of ascending colon: Secondary | ICD-10-CM

## 2015-10-12 DIAGNOSIS — Z95828 Presence of other vascular implants and grafts: Secondary | ICD-10-CM

## 2015-10-12 LAB — COMPREHENSIVE METABOLIC PANEL
ALT: 28 U/L (ref 0–55)
AST: 26 U/L (ref 5–34)
Albumin: 3.5 g/dL (ref 3.5–5.0)
Alkaline Phosphatase: 95 U/L (ref 40–150)
Anion Gap: 10 mEq/L (ref 3–11)
BILIRUBIN TOTAL: 0.4 mg/dL (ref 0.20–1.20)
BUN: 14 mg/dL (ref 7.0–26.0)
CHLORIDE: 100 meq/L (ref 98–109)
CO2: 25 meq/L (ref 22–29)
Calcium: 9.9 mg/dL (ref 8.4–10.4)
Creatinine: 0.8 mg/dL (ref 0.6–1.1)
EGFR: 87 mL/min/{1.73_m2} — AB (ref >90)
GLUCOSE: 98 mg/dL (ref 70–140)
Potassium: 4.2 mEq/L (ref 3.5–5.1)
SODIUM: 134 meq/L — AB (ref 136–145)
TOTAL PROTEIN: 7.1 g/dL (ref 6.4–8.3)

## 2015-10-12 LAB — CBC WITH DIFFERENTIAL/PLATELET
BASO%: 0.4 % (ref 0.0–2.0)
BASOS ABS: 0 10*3/uL (ref 0.0–0.1)
EOS ABS: 0.3 10*3/uL (ref 0.0–0.5)
EOS%: 3.2 % (ref 0.0–7.0)
HCT: 35.6 % (ref 34.8–46.6)
HGB: 11.9 g/dL (ref 11.6–15.9)
LYMPH#: 1.3 10*3/uL (ref 0.9–3.3)
LYMPH%: 15.6 % (ref 14.0–49.7)
MCH: 30.4 pg (ref 25.1–34.0)
MCHC: 33.4 g/dL (ref 31.5–36.0)
MCV: 91.1 fL (ref 79.5–101.0)
MONO#: 0.8 10*3/uL (ref 0.1–0.9)
MONO%: 9.3 % (ref 0.0–14.0)
NEUT%: 71.5 % (ref 38.4–76.8)
NEUTROS ABS: 6.2 10*3/uL (ref 1.5–6.5)
Platelets: 278 10*3/uL (ref 145–400)
RBC: 3.91 10*6/uL (ref 3.70–5.45)
RDW: 13.6 % (ref 11.2–14.5)
WBC: 8.6 10*3/uL (ref 3.9–10.3)

## 2015-10-12 LAB — UA PROTEIN, DIPSTICK - CHCC: Protein, ur: NEGATIVE mg/dL

## 2015-10-12 MED ORDER — SODIUM CHLORIDE 0.9 % IV SOLN
5.0000 mg/kg | Freq: Once | INTRAVENOUS | Status: AC
  Administered 2015-10-12: 325 mg via INTRAVENOUS
  Filled 2015-10-12: qty 13

## 2015-10-12 MED ORDER — HEPARIN SOD (PORK) LOCK FLUSH 100 UNIT/ML IV SOLN
500.0000 [IU] | Freq: Once | INTRAVENOUS | Status: DC | PRN
  Filled 2015-10-12: qty 5

## 2015-10-12 MED ORDER — IRINOTECAN HCL CHEMO INJECTION 100 MG/5ML
180.0000 mg/m2 | Freq: Once | INTRAVENOUS | Status: AC
  Administered 2015-10-12: 326 mg via INTRAVENOUS
  Filled 2015-10-12: qty 16.3

## 2015-10-12 MED ORDER — SODIUM CHLORIDE 0.9 % IV SOLN
1800.0000 mg/m2 | INTRAVENOUS | Status: DC
  Administered 2015-10-12: 3250 mg via INTRAVENOUS
  Filled 2015-10-12: qty 65

## 2015-10-12 MED ORDER — PALONOSETRON HCL INJECTION 0.25 MG/5ML
0.2500 mg | Freq: Once | INTRAVENOUS | Status: AC
  Administered 2015-10-12: 0.25 mg via INTRAVENOUS

## 2015-10-12 MED ORDER — SODIUM CHLORIDE 0.9% FLUSH
10.0000 mL | INTRAVENOUS | Status: DC | PRN
  Administered 2015-10-12: 10 mL via INTRAVENOUS
  Filled 2015-10-12: qty 10

## 2015-10-12 MED ORDER — SODIUM CHLORIDE 0.9% FLUSH
10.0000 mL | INTRAVENOUS | Status: DC | PRN
  Filled 2015-10-12: qty 10

## 2015-10-12 MED ORDER — SODIUM CHLORIDE 0.9 % IV SOLN
Freq: Once | INTRAVENOUS | Status: AC
  Administered 2015-10-12: 14:00:00 via INTRAVENOUS
  Filled 2015-10-12: qty 5

## 2015-10-12 MED ORDER — SODIUM CHLORIDE 0.9 % IV SOLN
Freq: Once | INTRAVENOUS | Status: AC
  Administered 2015-10-12: 14:00:00 via INTRAVENOUS

## 2015-10-12 MED ORDER — LEUCOVORIN CALCIUM INJECTION 100 MG
20.0000 mg/m2 | Freq: Once | INTRAMUSCULAR | Status: AC
  Administered 2015-10-12: 36 mg via INTRAVENOUS
  Filled 2015-10-12: qty 1.8

## 2015-10-12 MED ORDER — FLUOROURACIL CHEMO INJECTION 2.5 GM/50ML
300.0000 mg/m2 | Freq: Once | INTRAVENOUS | Status: AC
  Administered 2015-10-12: 550 mg via INTRAVENOUS
  Filled 2015-10-12: qty 11

## 2015-10-12 MED ORDER — PALONOSETRON HCL INJECTION 0.25 MG/5ML
INTRAVENOUS | Status: AC
  Filled 2015-10-12: qty 5

## 2015-10-12 MED ORDER — ATROPINE SULFATE 1 MG/ML IJ SOLN: 0.5000 mg | Freq: Once | INTRAMUSCULAR | Status: DC | PRN

## 2015-10-12 MED ORDER — SODIUM CHLORIDE 0.9 % IV SOLN: Freq: Once | INTRAVENOUS | Status: DC

## 2015-10-12 NOTE — Patient Instructions (Signed)
[place] Discharge Instructions for Patients Receiving Chemotherapy  Today you received the following chemotherapy agents Avastin, Irinotecan, Leucovorin, Fluouracil  To help prevent nausea and vomiting after your treatment, we encourage you to take your nausea medication as directed by your MD.  If you develop nausea and vomiting that is not controlled by your nausea medication, call the clinic.   BELOW ARE SYMPTOMS THAT SHOULD BE REPORTED IMMEDIATELY:  *FEVER GREATER THAN 100.5 F  *CHILLS WITH OR WITHOUT FEVER  NAUSEA AND VOMITING THAT IS NOT CONTROLLED WITH YOUR NAUSEA MEDICATION  *UNUSUAL SHORTNESS OF BREATH  *UNUSUAL BRUISING OR BLEEDING  TENDERNESS IN MOUTH AND THROAT WITH OR WITHOUT PRESENCE OF ULCERS  *URINARY PROBLEMS  *BOWEL PROBLEMS  UNUSUAL RASH Items with * indicate a potential emergency and should be followed up as soon as possible.  Feel free to call the clinic you have any questions or concerns. The clinic phone number is [phone].  Please show the [place] at check-in to the Emergency Department and triage nurse.  Irinotecan injection What is this medicine? IRINOTECAN (ir in oh TEE kan ) is a chemotherapy drug. It is used to treat colon and rectal cancer. This medicine may be used for other purposes; ask your health care provider or pharmacist if you have questions. What should I tell my health care provider before I take this medicine? They need to know if you have any of these conditions: -blood disorders -dehydration -diarrhea -infection (especially a virus infection such as chickenpox, cold sores, or herpes) -liver disease -low blood counts, like low white cell, platelet, or red cell counts -recent or ongoing radiation therapy -an unusual or allergic reaction to irinotecan, sorbitol, other chemotherapy, other medicines, foods, dyes, or preservatives -pregnant or trying to get pregnant -breast-feeding How should I  use this medicine? This drug is given as an infusion into a vein. It is administered in a hospital or clinic by a specially trained health care professional. Talk to your pediatrician regarding the use of this medicine in children. Special care may be needed. Overdosage: If you think you have taken too much of this medicine contact a poison control center or emergency room at once. NOTE: This medicine is only for you. Do not share this medicine with others. What if I miss a dose? It is important not to miss your dose. Call your doctor or health care professional if you are unable to keep an appointment. What may interact with this medicine? Do not take this medicine with any of the following medications: -atazanavir -certain medicines for fungal infections like itraconazole and ketoconazole -St. John's Wort This medicine may also interact with the following medications: -dexamethasone -diuretics -laxatives -medicines for seizures like carbamazepine, mephobarbital, phenobarbital, phenytoin, primidone -medicines to increase blood counts like filgrastim, pegfilgrastim, sargramostim -prochlorperazine -vaccines This list may not describe all possible interactions. Give your health care provider a list of all the medicines, herbs, non-prescription drugs, or dietary supplements you use. Also tell them if you smoke, drink alcohol, or use illegal drugs. Some items may interact with your medicine. What should I watch for while using this medicine? Your condition will be monitored carefully while you are receiving this medicine. You will need important blood work done while you are taking this medicine. This drug may make you feel generally unwell. This is not uncommon, as chemotherapy can affect healthy cells as well as cancer cells. Report any side effects. Continue your course of treatment even though you feel  ill unless your doctor tells you to stop. In some cases, you may be given additional  medicines to help with side effects. Follow all directions for their use. You may get drowsy or dizzy. Do not drive, use machinery, or do anything that needs mental alertness until you know how this medicine affects you. Do not stand or sit up quickly, especially if you are an older patient. This reduces the risk of dizzy or fainting spells. Call your doctor or health care professional for advice if you get a fever, chills or sore throat, or other symptoms of a cold or flu. Do not treat yourself. This drug decreases your body's ability to fight infections. Try to avoid being around people who are sick. This medicine may increase your risk to bruise or bleed. Call your doctor or health care professional if you notice any unusual bleeding. Be careful brushing and flossing your teeth or using a toothpick because you may get an infection or bleed more easily. If you have any dental work done, tell your dentist you are receiving this medicine. Avoid taking products that contain aspirin, acetaminophen, ibuprofen, naproxen, or ketoprofen unless instructed by your doctor. These medicines may hide a fever. Do not become pregnant while taking this medicine. Women should inform their doctor if they wish to become pregnant or think they might be pregnant. There is a potential for serious side effects to an unborn child. Talk to your health care professional or pharmacist for more information. Do not breast-feed an infant while taking this medicine. What side effects may I notice from receiving this medicine? Side effects that you should report to your doctor or health care professional as soon as possible: -allergic reactions like skin rash, itching or hives, swelling of the face, lips, or tongue -low blood counts - this medicine may decrease the number of white blood cells, red blood cells and platelets. You may be at increased risk for infections and bleeding. -signs of infection - fever or chills, cough, sore  throat, pain or difficulty passing urine -signs of decreased platelets or bleeding - bruising, pinpoint red spots on the skin, black, tarry stools, blood in the urine -signs of decreased red blood cells - unusually weak or tired, fainting spells, lightheadedness -breathing problems -chest pain -diarrhea -feeling faint or lightheaded, falls -flushing, runny nose, sweating during infusion -mouth sores or pain -pain, swelling, redness or irritation where injected -pain, swelling, warmth in the leg -pain, tingling, numbness in the hands or feet -problems with balance, talking, walking -stomach cramps, pain -trouble passing urine or change in the amount of urine -vomiting as to be unable to hold down drinks or food -yellowing of the eyes or skin Side effects that usually do not require medical attention (report to your doctor or health care professional if they continue or are bothersome): -constipation -hair loss -headache -loss of appetite -nausea, vomiting -stomach upset This list may not describe all possible side effects. Call your doctor for medical advice about side effects. You may report side effects to FDA at 1-800-FDA-1088. Where should I keep my medicine? This drug is given in a hospital or clinic and will not be stored at home. NOTE: This sheet is a summary. It may not cover all possible information. If you have questions about this medicine, talk to your doctor, pharmacist, or health care provider.    2016, [employer] Standard. (2013-02-24 16:29:32)

## 2015-10-12 NOTE — Progress Notes (Signed)
Oncology Nurse Navigator Documentation  Oncology Nurse Navigator Flowsheets 10/12/2015  Navigator Location [facility]Med Onc  Navigator Encounter Type Treatment  Treatment Initiated Date 10/12/2015  Patient Visit Type MedOnc  Treatment Phase First Chemo Tx--FOLFIRI #1  Barriers/Navigation Needs Family concerns  Education Coping with Diagnosis/ Prognosis;Symptom Management-diarrhea; fatigue; depression  Interventions Education Method  Referrals -  Education Method Verbal--reminded how to treat irinotecan diarrhea  Support Groups/Services Other--encouraged her to continue to speak w/counselor here  [employer] -  Time Spent with Patient 30  Time Spent with Patient (Retired) -  Airline pilot expressed displeasure of having lab at 11:45 and not getting back to infusion area until 1:15. Explained there is a cushion per policy between lab/chemo to allow results to be received and reviewed by nurse and MD if necessary. She also did not recall MD talking about immediate diarrhea potential during infusion. Informed her that he did, this RN was in the room when he told her. She is also expressing strong desire to not fall into the deep depression she did with prior treatment. She does not want to take an antidepressant due to potential side effects. She is asking for prescription Marinol instead. Informed her that Marinol is not prescribed for depression-used in oncology population when conventional antiemetics are not effective. Informed that MD will be made aware of her request.

## 2015-10-12 NOTE — Patient Instructions (Signed)

## 2015-10-12 NOTE — Progress Notes (Signed)
Pt highly anxious prior to treatment today. Pt refused to have nurse call dr. And ask for anxiety medication during treatment. She had a lot of questions regarding efficacy of chemotherapy and most common side effects. Provided pt with education regarding camptosar and how it can cause some diarrhea, hair loss, mucositis etc. Pt expressed her concerns about losing her hair. Pt also states that she had discussed with Dr. Benay Spice regarding taking herbal medication at home while on chemo treatments. She wants to keep taking it to improve her immune system in fighting the cancer cells. Told pt that she needs to make sure that she is discussing these matters with the MD and pharmacist to make sure that there are no drug interactions with her chemo and home medications. Pt states that she will bring it up again on her future appt with Dr. Benay Spice. Pt was upset about scheduling being delayed today. Charge nurse came to discuss pt issues today and clarified appointments for the next few weeks. She understands plan of care and new AVS printed out for pt. In basket follow up call placed today for tomorrow.

## 2015-10-13 ENCOUNTER — Emergency Department (HOSPITAL_COMMUNITY): Payer: 59

## 2015-10-13 ENCOUNTER — Encounter (HOSPITAL_COMMUNITY): Payer: Self-pay | Admitting: *Deleted

## 2015-10-13 ENCOUNTER — Telehealth: Payer: Self-pay

## 2015-10-13 ENCOUNTER — Emergency Department (HOSPITAL_COMMUNITY)
Admission: EM | Admit: 2015-10-13 | Discharge: 2015-10-13 | Disposition: A | Discharge status: Home / Self Care | Payer: 59 | Attending: Emergency Medicine | Admitting: Emergency Medicine

## 2015-10-13 ENCOUNTER — Other Ambulatory Visit: Payer: Self-pay

## 2015-10-13 DIAGNOSIS — R51 Headache: Secondary | ICD-10-CM | POA: Insufficient documentation

## 2015-10-13 DIAGNOSIS — Z8639 Personal history of other endocrine, nutritional and metabolic disease: Secondary | ICD-10-CM | POA: Diagnosis not present

## 2015-10-13 DIAGNOSIS — Z85118 Personal history of other malignant neoplasm of bronchus and lung: Secondary | ICD-10-CM | POA: Diagnosis not present

## 2015-10-13 DIAGNOSIS — F419 Anxiety disorder, unspecified: Secondary | ICD-10-CM | POA: Insufficient documentation

## 2015-10-13 DIAGNOSIS — Z85038 Personal history of other malignant neoplasm of large intestine: Secondary | ICD-10-CM | POA: Insufficient documentation

## 2015-10-13 DIAGNOSIS — Z87891 Personal history of nicotine dependence: Secondary | ICD-10-CM | POA: Insufficient documentation

## 2015-10-13 DIAGNOSIS — R0602 Shortness of breath: Secondary | ICD-10-CM | POA: Diagnosis present

## 2015-10-13 DIAGNOSIS — Z79899 Other long term (current) drug therapy: Secondary | ICD-10-CM | POA: Insufficient documentation

## 2015-10-13 DIAGNOSIS — R0600 Dyspnea, unspecified: Secondary | ICD-10-CM | POA: Insufficient documentation

## 2015-10-13 DIAGNOSIS — Z8505 Personal history of malignant neoplasm of liver: Secondary | ICD-10-CM | POA: Insufficient documentation

## 2015-10-13 DIAGNOSIS — Z791 Long term (current) use of non-steroidal anti-inflammatories (NSAID): Secondary | ICD-10-CM | POA: Insufficient documentation

## 2015-10-13 DIAGNOSIS — R61 Generalized hyperhidrosis: Secondary | ICD-10-CM | POA: Diagnosis not present

## 2015-10-13 LAB — CBC WITH DIFFERENTIAL/PLATELET
Basophils Absolute: 0 10*3/uL (ref 0.0–0.1)
Basophils Relative: 0 %
EOS PCT: 0 %
Eosinophils Absolute: 0 10*3/uL (ref 0.0–0.7)
HCT: 38.2 % (ref 36.0–46.0)
HEMOGLOBIN: 13.5 g/dL (ref 12.0–15.0)
LYMPHS ABS: 0.8 10*3/uL (ref 0.7–4.0)
LYMPHS PCT: 11 %
MCH: 31.5 pg (ref 26.0–34.0)
MCHC: 35.3 g/dL (ref 30.0–36.0)
MCV: 89.3 fL (ref 78.0–100.0)
MONOS PCT: 1 %
Monocytes Absolute: 0.1 10*3/uL (ref 0.1–1.0)
Neutro Abs: 6.2 10*3/uL (ref 1.7–7.7)
Neutrophils Relative %: 88 %
PLATELETS: 291 10*3/uL (ref 150–400)
RBC: 4.28 MIL/uL (ref 3.87–5.11)
RDW: 12.8 % (ref 11.5–15.5)
WBC: 7.1 10*3/uL (ref 4.0–10.5)

## 2015-10-13 LAB — URINALYSIS, ROUTINE W REFLEX MICROSCOPIC
Bilirubin Urine: NEGATIVE
GLUCOSE, UA: NEGATIVE mg/dL
Hgb urine dipstick: NEGATIVE
Ketones, ur: NEGATIVE mg/dL
Leukocytes, UA: NEGATIVE
Nitrite: NEGATIVE
Protein, ur: NEGATIVE mg/dL
SPECIFIC GRAVITY, URINE: 1.01 (ref 1.005–1.030)
pH: 8.5 — ABNORMAL HIGH (ref 5.0–8.0)

## 2015-10-13 LAB — COMPREHENSIVE METABOLIC PANEL
ALBUMIN: 4.1 g/dL (ref 3.5–5.0)
ALT: 109 U/L — AB (ref 14–54)
AST: 120 U/L — AB (ref 15–41)
Alkaline Phosphatase: 117 U/L (ref 38–126)
Anion gap: 13 (ref 5–15)
BUN: 12 mg/dL (ref 6–20)
CHLORIDE: 99 mmol/L — AB (ref 101–111)
CO2: 23 mmol/L (ref 22–32)
Calcium: 10.1 mg/dL (ref 8.9–10.3)
Creatinine, Ser: 0.73 mg/dL (ref 0.44–1.00)
GFR calc Af Amer: >60 mL/min (ref >60)
Glucose, Bld: 140 mg/dL — ABNORMAL HIGH (ref 65–99)
POTASSIUM: 4.2 mmol/L (ref 3.5–5.1)
SODIUM: 135 mmol/L (ref 135–145)
Total Bilirubin: 0.7 mg/dL (ref 0.3–1.2)
Total Protein: 7.9 g/dL (ref 6.5–8.1)

## 2015-10-13 MED ORDER — IOHEXOL 350 MG/ML SOLN
100.0000 mL | Freq: Once | INTRAVENOUS | Status: AC | PRN
  Administered 2015-10-13: 100 mL via INTRAVENOUS

## 2015-10-13 MED ORDER — SODIUM CHLORIDE 0.9 % IV BOLUS (SEPSIS)
1000.0000 mL | Freq: Once | INTRAVENOUS | Status: AC
  Administered 2015-10-13: 1000 mL via INTRAVENOUS

## 2015-10-13 NOTE — Discharge Instructions (Signed)
Your tests tonight are good, no heart attack, blood clot in your lung, pneumonia. Your oxygen levels are normal even when you are walking. Follow up discussing treatment of your depression and anxiety with your oncology nurse. Return to the emergency department to get a fever or you seem worse.

## 2015-10-13 NOTE — ED Notes (Signed)
Pt c/o sob that started yesterday after receiving her second round of chemotherapy;

## 2015-10-13 NOTE — ED Notes (Signed)
MD at bedside. 

## 2015-10-13 NOTE — ED Notes (Signed)
Pt ambulated to the nurses station with 100% oxygen level.

## 2015-10-13 NOTE — Telephone Encounter (Signed)
Pt states she left [facility] 430 pm. She says she was labored breathing as she was walking to her car. She was anxious to go home and did not think anything about it. Sitting down in car was better. She was SOB when she walked around her house. She went to ED. The CT was negative for pneumonia or clot. Pt is at home and still gets SOB easily but is a little more comfortable being in her own environment. Pt feels it is from the chemo. She does know she has a lot of stress and lives alone.  She is wanting to talk with Dr Benay Spice about the treatment - if this is how it will be where she gets SOB and ends up in ER, she does not want to take chemo. She was offered a visit at [facility] and feels this is not needed.

## 2015-10-13 NOTE — ED Notes (Signed)
Pt ambulated to restroom with a steady, even gait. Pt denied dyspnea with exertion and O2 saturation was 97% on RA.

## 2015-10-13 NOTE — ED Provider Notes (Signed)
CSN: [id number]     Arrival date & time 10/13/15  0050 History   First MD Initiated Contact with Patient 10/13/15 0057    Chief Complaint  Patient presents with  . Shortness of Breath     (Consider location/radiation/quality/duration/timing/severity/associated sxs/prior Treatment) HPI patient reports she has stage IV colon cancer. She states she finished her first round of chemotherapy about 5 months ago however her subsequent scans have showed spread of her disease so she started her second round of chemotherapy earlier today on the 31st. She states she left the cancer Center at [facility] about 4:30 PM. She states she had done well with the infusion however when she walked out to the parking lot she started feeling she was having shortness of breath. She drove herself home and even stopped at a fast food restaurant to get food at the [employer]. She states she felt short of breath when she walked into her house. She states she fell asleep for a few hours and she woke up about 10 PM and had to use the bathroom and had a normal BM. She states she started having hot flashes and sweating and then she would freeze. She states she has need "I want to be still". She denies nausea, chest pain, wheezing, pain or swelling in her legs. She states she has a slight headache. She states she had a dry cough before chemotherapy but hasn't had one since her infusion started. She denies prior history of COPD or asthma.   PCP Dr Nadara Mustard Oncology Dr Benay Spice  Past Medical History  Diagnosis Date  . Hypothyroidism   . Colon cancer ([place])     with mets to liver   Past Surgical History  Procedure Laterality Date  . Cesarean section    . Partial colectomy N/A 08/31/2014    Procedure: Right Colon Resection ;  Surgeon: Jamesetta So, MD;  Location: [facility];  Service: General;  Laterality: N/A;  . Liver biopsy N/A 08/31/2014    Procedure: LIVER BIOPSY;  Surgeon: Jamesetta So, MD;  Location: [facility];  Service:  General;  Laterality: N/A;  . Portacath placement Right 11/30/2014    Procedure: INSERTION PORT-A-CATH right subclavian;  Surgeon: Aviva Signs Md, MD;  Location: [facility];  Service: General;  Laterality: Right;   Family History  Problem Relation Age of Onset  . Colon cancer Cousin     age late 26s  . Inflammatory bowel disease Neg Hx    Social History  Substance Use Topics  . Smoking status: Former Smoker -- 1.50 packs/day for 18 years    Types: Cigarettes    Quit date: 09/15/1994  . Smokeless tobacco: None  . Alcohol Use: 0.6 oz/week    1 Glasses of wine, 0 Standard drinks or equivalent per week     Comment: Maybe 1 glass a week   Lives alone  OB History    Gravida Para Term Preterm AB TAB SAB Ectopic Multiple Living   2         1     Review of Systems  All other systems reviewed and are negative.     Allergies  Codeine  Home Medications   Prior to Admission medications   Medication Sig Start Date End Date Taking? Authorizing Provider  acidophilus (RISAQUAD) CAPS capsule Take 1 capsule by mouth 2 (two) times daily. 11/18/14   Kathie Dike, MD  cyclobenzaprine (FLEXERIL) 10 MG tablet Take 10 mg by mouth at bedtime as needed.  07/06/15  Historical Provider, MD  diphenoxylate-atropine (LOMOTIL) 2.5-0.025 MG per tablet Take 1 tablet by mouth 4 (four) times daily as needed for diarrhea or loose stools. 11/10/14   Ladell Pier, MD  ibuprofen (ADVIL,MOTRIN) 600 MG tablet Take 1 tablet (600 mg total) by mouth 2 (two) times daily. 06/11/15   Ladell Pier, MD  lidocaine-prilocaine (EMLA) cream Apply to port site one hour prior to use. Do not rub in. Cover with plastic. 11/27/14   Ladell Pier, MD  loperamide (IMODIUM) 2 MG capsule Take 2-4 mg by mouth as needed for diarrhea or loose stools.    Historical Provider, MD  nabumetone (RELAFEN) 500 MG tablet  06/25/15   Historical Provider, MD  ondansetron (ZOFRAN) 4 MG tablet Take 1 tablet (4 mg total) by mouth every 6 (six)  hours as needed for nausea or vomiting. 11/18/14   Kathie Dike, MD  prochlorperazine (COMPAZINE) 10 MG tablet Take 1 tablet (10 mg total) by mouth every 6 (six) hours as needed for nausea or vomiting. 11/06/14   Ladell Pier, MD  traMADol (ULTRAM) 50 MG tablet Take 1 tablet (50 mg total) by mouth every 6 (six) hours as needed. 09/08/15   Ladell Pier, MD   BP 149/92 mmHg  Pulse 72  Temp(Src) 97.7 F (36.5 C) (Oral)  Resp 15  Ht _0  (1.676 m)  Wt 155 lb (70.308 kg)  BMI 25.03 kg/m2  SpO2 100%  Vital signs normal   Physical Exam  Constitutional: She is oriented to person, place, and time. She appears well-developed and well-nourished.  Non-toxic appearance. She does not appear ill. No distress.  talkative  HENT:  Head: Normocephalic and atraumatic.  Right Ear: External ear normal.  Left Ear: External ear normal.  Nose: Nose normal. No mucosal edema or rhinorrhea.  Mouth/Throat: Oropharynx is clear and moist and mucous membranes are normal. No dental abscesses or uvula swelling.  Eyes: Conjunctivae and EOM are normal. Pupils are equal, round, and reactive to light.  Neck: Normal range of motion and full passive range of motion without pain. Neck supple.  Cardiovascular: Normal rate, regular rhythm and normal heart sounds.  Exam reveals no gallop and no friction rub.   No murmur heard. Pulmonary/Chest: Effort normal and breath sounds normal. No respiratory distress. She has no wheezes. She has no rhonchi. She has no rales. She exhibits no tenderness and no crepitus.  Every once in a while she does a noise when she breathes in that seems to originate from her throat  Abdominal: Soft. Normal appearance and bowel sounds are normal. She exhibits no distension. There is no tenderness. There is no rebound and no guarding.  Musculoskeletal: Normal range of motion. She exhibits no edema or tenderness.  Moves all extremities well.   Neurological: She is alert and oriented to person,  place, and time. She has normal strength. No cranial nerve deficit.  Skin: Skin is warm, dry and intact. No rash noted. No erythema. No pallor.  Psychiatric: Her speech is normal and behavior is normal. Her mood appears anxious.  Nursing note and vitals reviewed.   ED Course  Procedures (including critical care time)  Medications  sodium chloride 0.9 % bolus 1,000 mL (0 mLs Intravenous Stopped 10/13/15 0324)  iohexol (OMNIPAQUE) 350 MG/ML injection 100 mL (100 mLs Intravenous Contrast Given 10/13/15 0355)    Review of patient's chart shows she got her first infusion of Tolfiri on the 31st.  Despite patient complaining of feeling short of  breath whenever I go visit her in her room she is extremely talkative. She is talking in complete sentences, the longer she is in the ED the less often she is making the noise when she breathes in her throat.  We discussed her initial test results which were all reassuring and normal. She has the known pulmonary metastasis. We discussed she is at increased risk for having pulmonary was due to her underlying cancer. She is agreeable to getting a CT scan done. She also states she's had a dry cough for a while and this would also show she had a pneumonia that was not visible on her chest x-ray.  Patient was given the results of her CT scan which showed her known metastasis but no pneumonia or pulmonary embolus. She was ambulated by nursing staff and her pulse ox was 97% on room air. When patient arrived in the ED her initial pulse ox was 99% on room air. Patient does relay that she has extreme depression about starting the chemotherapy again and was crying this weekend. She does admit that she is having a lot of anxiety. She has already talked to the oncology nurse about treatment of her depression and anxiety. She is encouraged to follow through with this. At this point she felt she did not have any physical reason for her shortness of breath and she could be  discharged home.   Labs Review   Results for orders placed or performed during the hospital encounter of 10/13/15  Culture, blood (routine x 2)  Result Value Ref Range   Specimen Description BLOOD LEFT ANTECUBITAL    Special Requests BOTTLES DRAWN AEROBIC AND ANAEROBIC 8CC EACH    Culture PENDING    Report Status PENDING   Culture, blood (routine x 2)  Result Value Ref Range   Specimen Description BLOOD LEFT HAND    Special Requests BOTTLES DRAWN AEROBIC AND ANAEROBIC 5CC EACH    Culture PENDING    Report Status PENDING   Comprehensive metabolic panel  Result Value Ref Range   Sodium 135 135 - 145 mmol/L   Potassium 4.2 3.5 - 5.1 mmol/L   Chloride 99 (L) 101 - 111 mmol/L   CO2 23 22 - 32 mmol/L   Glucose, Bld 140 (H) 65 - 99 mg/dL   BUN 12 6 - 20 mg/dL   Creatinine, Ser 0.73 0.44 - 1.00 mg/dL   Calcium 10.1 8.9 - 10.3 mg/dL   Total Protein 7.9 6.5 - 8.1 g/dL   Albumin 4.1 3.5 - 5.0 g/dL   AST 120 (H) 15 - 41 U/L   ALT 109 (H) 14 - 54 U/L   Alkaline Phosphatase 117 38 - 126 U/L   Total Bilirubin 0.7 0.3 - 1.2 mg/dL   GFR calc non Af Amer >60 >60 mL/min   GFR calc Af Amer >60 >60 mL/min   Anion gap 13 5 - 15  CBC with Differential  Result Value Ref Range   WBC 7.1 4.0 - 10.5 K/uL   RBC 4.28 3.87 - 5.11 MIL/uL   Hemoglobin 13.5 12.0 - 15.0 g/dL   HCT 38.2 36.0 - 46.0 %   MCV 89.3 78.0 - 100.0 fL   MCH 31.5 26.0 - 34.0 pg   MCHC 35.3 30.0 - 36.0 g/dL   RDW 12.8 11.5 - 15.5 %   Platelets 291 150 - 400 K/uL   Neutrophils Relative % 88 %   Neutro Abs 6.2 1.7 - 7.7 K/uL   Lymphocytes Relative 11 %  Lymphs Abs 0.8 0.7 - 4.0 K/uL   Monocytes Relative 1 %   Monocytes Absolute 0.1 0.1 - 1.0 K/uL   Eosinophils Relative 0 %   Eosinophils Absolute 0.0 0.0 - 0.7 K/uL   Basophils Relative 0 %   Basophils Absolute 0.0 0.0 - 0.1 K/uL  Urinalysis, Routine w reflex microscopic  Result Value Ref Range   Color, Urine YELLOW YELLOW   APPearance CLEAR CLEAR   Specific Gravity,  Urine 1.010 1.005 - 1.030   pH 8.5 (H) 5.0 - 8.0   Glucose, UA NEGATIVE NEGATIVE mg/dL   Hgb urine dipstick NEGATIVE NEGATIVE   Bilirubin Urine NEGATIVE NEGATIVE   Ketones, ur NEGATIVE NEGATIVE mg/dL   Protein, ur NEGATIVE NEGATIVE mg/dL   Nitrite NEGATIVE NEGATIVE   Leukocytes, UA NEGATIVE NEGATIVE   Laboratory interpretation all normal       Results for orders placed or performed in visit on 10/12/15  CBC with Differential  Result Value Ref Range   WBC 8.6 3.9 - 10.3 10e3/uL   NEUT# 6.2 1.5 - 6.5 10e3/uL   HGB 11.9 11.6 - 15.9 g/dL   HCT 35.6 34.8 - 46.6 %   Platelets 278 145 - 400 10e3/uL   MCV 91.1 79.5 - 101.0 fL   MCH 30.4 25.1 - 34.0 pg   MCHC 33.4 31.5 - 36.0 g/dL   RBC 3.91 3.70 - 5.45 10e6/uL   RDW 13.6 11.2 - 14.5 %   lymph# 1.3 0.9 - 3.3 10e3/uL   MONO# 0.8 0.1 - 0.9 10e3/uL   Eosinophils Absolute 0.3 0.0 - 0.5 10e3/uL   Basophils Absolute 0.0 0.0 - 0.1 10e3/uL   NEUT% 71.5 38.4 - 76.8 %   LYMPH% 15.6 14.0 - 49.7 %   MONO% 9.3 0.0 - 14.0 %   EOS% 3.2 0.0 - 7.0 %   BASO% 0.4 0.0 - 2.0 %  Comprehensive metabolic panel  Result Value Ref Range   Sodium 134 (L) 136 - 145 mEq/L   Potassium 4.2 3.5 - 5.1 mEq/L   Chloride 100 98 - 109 mEq/L   CO2 25 22 - 29 mEq/L   Glucose 98 70 - 140 mg/dl   BUN 14.0 7.0 - 26.0 mg/dL   Creatinine 0.8 0.6 - 1.1 mg/dL   Total Bilirubin 0.40 0.20 - 1.20 mg/dL   Alkaline Phosphatase 95 40 - 150 U/L   AST 26 5 - 34 U/L   ALT 28 0 - 55 U/L   Total Protein 7.1 6.4 - 8.3 g/dL   Albumin 3.5 3.5 - 5.0 g/dL   Calcium 9.9 8.4 - 10.4 mg/dL   Anion Gap 10 3 - 11 mEq/L   EGFR 87 (L) >90 ml/min/1.73 m2  Urine protein by dipstick - [facility]  Result Value Ref Range   Protein, ur Negative Negative- <30 mg/dL      Imaging Review Dg Chest 2 View  10/13/2015  CLINICAL DATA:  Shortness of breath after chemotherapy. History of metastatic colon cancer. EXAM: CHEST  2 VIEW COMPARISON:  Most recent chest imaging CT 10/05/2015 FINDINGS: Tip of  the right chest port in the SVC. Multiple bilateral pulmonary metastasis are stable in appearance compared to prior CT. No superimposed consolidation. Cardiomediastinal contours are normal. No pulmonary edema, pleural effusion or pneumothorax. No acute osseous abnormalities are seen. IMPRESSION: 1.  No acute pulmonary process. 2. Multiple pulmonary metastasis, unchanged from recent CT. Electronically Signed   By: Jeb Levering M.D.   On: 10/13/2015 02:47   Ct Angio  Chest Pe W/cm &/or Wo Cm  10/13/2015  CLINICAL DATA:  Colon cancer and acute shortness of breath. Chemotherapy yesterday. EXAM: CT ANGIOGRAPHY CHEST WITH CONTRAST TECHNIQUE: Multidetector CT imaging of the chest was performed using the standard protocol during bolus administration of intravenous contrast. Multiplanar CT image reconstructions and MIPs were obtained to evaluate the vascular anatomy. CONTRAST:  116m OMNIPAQUE IOHEXOL 350 MG/ML SOLN COMPARISON:  10/04/2014 FINDINGS: THORACIC INLET/BODY WALL: Porta catheter on the right in good position. MEDIASTINUM: Normal heart size. No pericardial effusion. No acute vascular abnormality, including pulmonary embolism. No adenopathy. LUNG WINDOWS: Innumerable pulmonary metastases, many measuring 3 cm or larger, stable from scan a few days prior. As before, there is occasional internal calcification. There is no edema, consolidation, effusion, or pneumothorax. UPPER ABDOMEN: Known hepatic metastatic disease with differences in appearance attributed to contrast timing. OSSEOUS: No acute fracture.  No suspicious lytic or blastic lesions. Review of the MIP images confirms the above findings. IMPRESSION: 1. No acute finding, including pulmonary embolism. 2. Extensive pulmonary and hepatic metastatic disease. Electronically Signed   By: JMonte FantasiaM.D.   On: 10/13/2015 04:16      Ct Chest W Contrast  Ct Abdomen Pelvis W Contrast  10/05/2015  CLINICAL DATA:  Subsequent evaluation of a 60year old  female with history of colon cancer status post surgical resection. Lung metastasis. EXAM: CT CHEST, ABDOMEN, AND PELVIS WITH CONTRAST TECHNIQUE: Multidetector CT imaging of the chest, abdomen and pelvis was performed following the standard protocol during bolus administration of intravenous contrast. CONTRAST:  1015mOMNIPAQUE IOHEXOL 300 MG/ML  SOLN COMPARISON:  Multiple priors, most recently CT of the chest, abdomen and pelvis 07/27/2015. FINDINGS: CT CHEST FINDINGS Mediastinum/Lymph Nodes: Heart size is normal. There is no significant pericardial fluid, thickening or pericardial calcification. No pathologically enlarged mediastinal or hilar lymph nodes. Esophagus is unremarkable in appearance. No axillary lymphadenopathy. Right subclavian single-lumen porta cath with tip terminating in the distal superior vena cava. Lungs/Pleura: Interval enlargement of multiple previously noted pulmonary nodules and masses, largest of which is in the periphery of the left lower lobe (image 41 of series 3), currently measuring 3.9 x 2.4 cm (previously 2.9 x 1.8 cm on prior study 07/27/2015). Another notable mass is in the inferior aspect of the right lower lobe (image 49 of series 3) where there is a macrolobulated 3.5 x 2.2 cm mass which previously measured only 1.6 x 1.1 cm on 07/27/2015. Several new pulmonary nodules are noted, including a 5 mm nodule in the medial left lower lobe (image 51 of series 3), and a 7 mm nodule in the lateral right lower lobe (image 50 of series 3). No acute consolidative airspace disease. No pleural effusions. Musculoskeletal/Soft Tissues: There are no aggressive appearing lytic or blastic lesions noted in the visualized portions of the skeleton. CT ABDOMEN AND PELVIS FINDINGS Hepatobiliary: Interval enlargement of a hypovascular mass in segment 2 of the liver (image 55 of series 2), which currently measures 3.6 x 2.7 cm (previously 1.4 cm on prior study 07/27/2015). Likewise, previously noted  hypovascular lesion in segment 7 of the liver (image 58 of series 2) is also enlarged, measuring 2.4 cm (previously 1.0 cm). Other previously noted subtle hypovascular lesion in segment 4B has significantly increased from barely perceptible to 1.7 x 1.3 cm (image 63 of series 2). New hypovascular lesion in inferior aspect of segment 5 measuring 1.3 cm (image 73 of series 2). Several other well-defined low-attenuation hepatic lesions appear unchanged in size, number and distribution,  presumably small cysts. No intra or extrahepatic biliary ductal dilatation. Gallbladder is normal in appearance. Pancreas: No pancreatic mass. No pancreatic ductal dilatation. No pancreatic or peripancreatic fluid or inflammatory changes. Spleen: Unremarkable. Adrenals/Urinary Tract: Bilateral adrenal glands and bilateral kidneys are normal in appearance. No hydroureteronephrosis. Urinary bladder is normal in appearance. Stomach/Bowel: Normal appearance of the stomach. No pathologic dilatation of small bowel or colon. Status post right hemicolectomy. Vascular/Lymphatic: Minimal atherosclerosis in the abdominal and pelvic vasculature, without evidence of aneurysm or dissection. Interval enlargement of several retroperitoneal lymph nodes, largest of which is in the left para-aortic nodal station measuring 1 cm in short axis. Reproductive: Uterus and ovaries are unremarkable in appearance. Other: No significant volume of ascites.  No pneumoperitoneum. Musculoskeletal: There are no aggressive appearing lytic or blastic lesions noted in the visualized portions of the skeleton. IMPRESSION: 1. Today's study demonstrates progressive metastatic disease to the lungs and liver, as detailed above. In addition, there is increasing lymphadenopathy in the retroperitoneum. 2. Additional incidental findings, as above, similar prior examinations. Electronically Signed   By: Vinnie Langton M.D.   On: 10/05/2015 12:19        I have personally  reviewed and evaluated these images and lab results as part of my medical decision-making.     ED ECG REPORT   Date: 10/13/2015  Rate: 69  Rhythm: normal sinus rhythm  QRS Axis: normal  Intervals: PR prolonged  ST/T Wave abnormalities: nonspecific T wave changes  Conduction Disutrbances:none  Narrative Interpretation:   Old EKG Reviewed: none available  I have personally reviewed the EKG tracing and agree with the computerized printout as noted.   MDM   Final diagnoses:  Dyspnea    Plan discharge  Rolland Porter, MD, Barbette Or, MD 10/13/15 [phone]

## 2015-10-14 ENCOUNTER — Ambulatory Visit (HOSPITAL_BASED_OUTPATIENT_CLINIC_OR_DEPARTMENT_OTHER): Payer: 59

## 2015-10-14 ENCOUNTER — Telehealth: Payer: Self-pay | Admitting: *Deleted

## 2015-10-14 VITALS — BP 116/69 | HR 66 | Temp 97.9°F | Resp 20

## 2015-10-14 DIAGNOSIS — C787 Secondary malignant neoplasm of liver and intrahepatic bile duct: Secondary | ICD-10-CM

## 2015-10-14 DIAGNOSIS — C182 Malignant neoplasm of ascending colon: Secondary | ICD-10-CM | POA: Diagnosis not present

## 2015-10-14 DIAGNOSIS — C7800 Secondary malignant neoplasm of unspecified lung: Secondary | ICD-10-CM

## 2015-10-14 DIAGNOSIS — C180 Malignant neoplasm of cecum: Secondary | ICD-10-CM

## 2015-10-14 LAB — URINE CULTURE

## 2015-10-14 MED ORDER — HEPARIN SOD (PORK) LOCK FLUSH 100 UNIT/ML IV SOLN
500.0000 [IU] | Freq: Once | INTRAVENOUS | Status: AC | PRN
  Administered 2015-10-14: 500 [IU]
  Filled 2015-10-14: qty 5

## 2015-10-14 MED ORDER — SODIUM CHLORIDE 0.9% FLUSH
10.0000 mL | INTRAVENOUS | Status: DC | PRN
  Administered 2015-10-14: 10 mL
  Filled 2015-10-14: qty 10

## 2015-10-14 NOTE — Telephone Encounter (Signed)
Called pt, she reports mild nausea last night. Has not had any recurrent dyspnea since leaving ED. Pt eating breakfast, reports fatigue, denies diarrhea. Pt wants "to get to the bottom of the labored breathing." Oxygen level was normal in ED, pt thinks chemo caused the dyspnea.  Discussed anxiety, pt stated she was anxious after a long wait in chemo but doesn't think this contributed to her dyspnea. She understands to call the office if this recurs.

## 2015-10-15 ENCOUNTER — Telehealth: Payer: Self-pay | Admitting: *Deleted

## 2015-10-15 NOTE — Telephone Encounter (Signed)
  Oncology Nurse Navigator Documentation  Navigator Location: [facility] (10/15/15 1641) Navigator Encounter Type: Telephone (10/15/15 1641) Telephone: Outgoing Call;Patient Update (10/15/15 1641)  Martha Becker to check on her before weekend: she is eating well, mild nausea and bowels moving without diarrhea. No further shortness of breath. Feeling tired-has been up and down all day and took a nap earlier today. Over all she says "I'm OK".                                      Time Spent with Patient: 15 (10/15/15 1641)

## 2015-10-18 ENCOUNTER — Telehealth: Payer: Self-pay | Admitting: *Deleted

## 2015-10-18 LAB — CULTURE, BLOOD (ROUTINE X 2)
CULTURE: NO GROWTH
Culture: NO GROWTH

## 2015-10-18 NOTE — Telephone Encounter (Signed)
Pt called reports "I wake up in the morning with this nasal congestion ever since the chemo and I know its from the chemo; also I have this dry cough on and off; then I have this burping throughout the day and I know y'all are going to tell me its not from the chemo but it is"  Pt also wants Dr. Benay Spice to be aware of a "report I heard on the news from the [facility] about this new therapy that works with the cells that they put back in you, the good cells"  Per Dr. Benay Spice; notified pt that she can use OTC meds to help relieve symptoms and MD does not think this is chemo related; she can bring in information re: study and MD will review.  Pt verbalized understanding and confirmed appt for 2/14.

## 2015-10-23 ENCOUNTER — Other Ambulatory Visit: Payer: Self-pay | Admitting: Oncology

## 2015-10-26 ENCOUNTER — Ambulatory Visit: Payer: 59

## 2015-10-26 ENCOUNTER — Ambulatory Visit (HOSPITAL_BASED_OUTPATIENT_CLINIC_OR_DEPARTMENT_OTHER): Payer: 59

## 2015-10-26 ENCOUNTER — Other Ambulatory Visit (HOSPITAL_BASED_OUTPATIENT_CLINIC_OR_DEPARTMENT_OTHER): Payer: 59

## 2015-10-26 ENCOUNTER — Ambulatory Visit (HOSPITAL_BASED_OUTPATIENT_CLINIC_OR_DEPARTMENT_OTHER): Payer: 59 | Admitting: Nurse Practitioner

## 2015-10-26 VITALS — BP 123/73 | HR 86 | Temp 98.2°F | Resp 18 | Ht 66.0 in | Wt 155.9 lb

## 2015-10-26 DIAGNOSIS — Z5111 Encounter for antineoplastic chemotherapy: Secondary | ICD-10-CM | POA: Diagnosis not present

## 2015-10-26 DIAGNOSIS — C7802 Secondary malignant neoplasm of left lung: Secondary | ICD-10-CM | POA: Diagnosis not present

## 2015-10-26 DIAGNOSIS — C180 Malignant neoplasm of cecum: Secondary | ICD-10-CM

## 2015-10-26 DIAGNOSIS — C786 Secondary malignant neoplasm of retroperitoneum and peritoneum: Secondary | ICD-10-CM

## 2015-10-26 DIAGNOSIS — G620 Drug-induced polyneuropathy: Secondary | ICD-10-CM

## 2015-10-26 DIAGNOSIS — C7801 Secondary malignant neoplasm of right lung: Secondary | ICD-10-CM

## 2015-10-26 DIAGNOSIS — C182 Malignant neoplasm of ascending colon: Secondary | ICD-10-CM

## 2015-10-26 DIAGNOSIS — C787 Secondary malignant neoplasm of liver and intrahepatic bile duct: Secondary | ICD-10-CM | POA: Diagnosis not present

## 2015-10-26 DIAGNOSIS — C772 Secondary and unspecified malignant neoplasm of intra-abdominal lymph nodes: Secondary | ICD-10-CM

## 2015-10-26 DIAGNOSIS — Z5112 Encounter for antineoplastic immunotherapy: Secondary | ICD-10-CM | POA: Diagnosis not present

## 2015-10-26 DIAGNOSIS — C801 Malignant (primary) neoplasm, unspecified: Secondary | ICD-10-CM

## 2015-10-26 DIAGNOSIS — Z800 Family history of malignant neoplasm of digestive organs: Secondary | ICD-10-CM

## 2015-10-26 DIAGNOSIS — Z95828 Presence of other vascular implants and grafts: Secondary | ICD-10-CM

## 2015-10-26 DIAGNOSIS — C7800 Secondary malignant neoplasm of unspecified lung: Secondary | ICD-10-CM

## 2015-10-26 LAB — CBC WITH DIFFERENTIAL/PLATELET
BASO%: 1.1 % (ref 0.0–2.0)
Basophils Absolute: 0 10*3/uL (ref 0.0–0.1)
EOS%: 2.8 % (ref 0.0–7.0)
Eosinophils Absolute: 0.1 10*3/uL (ref 0.0–0.5)
HCT: 34.9 % (ref 34.8–46.6)
HEMOGLOBIN: 11.6 g/dL (ref 11.6–15.9)
LYMPH%: 38.6 % (ref 14.0–49.7)
MCH: 30.2 pg (ref 25.1–34.0)
MCHC: 33.2 g/dL (ref 31.5–36.0)
MCV: 90.7 fL (ref 79.5–101.0)
MONO#: 0.4 10*3/uL (ref 0.1–0.9)
MONO%: 12.6 % (ref 0.0–14.0)
NEUT%: 44.9 % (ref 38.4–76.8)
NEUTROS ABS: 1.6 10*3/uL (ref 1.5–6.5)
Platelets: 189 10*3/uL (ref 145–400)
RBC: 3.85 10*6/uL (ref 3.70–5.45)
RDW: 13.8 % (ref 11.2–14.5)
WBC: 3.5 10*3/uL — AB (ref 3.9–10.3)
lymph#: 1.4 10*3/uL (ref 0.9–3.3)

## 2015-10-26 LAB — COMPREHENSIVE METABOLIC PANEL
ALBUMIN: 3.5 g/dL (ref 3.5–5.0)
ALK PHOS: 93 U/L (ref 40–150)
ALT: 16 U/L (ref 0–55)
AST: 17 U/L (ref 5–34)
Anion Gap: 11 mEq/L (ref 3–11)
BUN: 11 mg/dL (ref 7.0–26.0)
CO2: 23 meq/L (ref 22–29)
Calcium: 9.4 mg/dL (ref 8.4–10.4)
Chloride: 103 mEq/L (ref 98–109)
Creatinine: 0.8 mg/dL (ref 0.6–1.1)
EGFR: 80 mL/min/{1.73_m2} — ABNORMAL LOW (ref >90)
GLUCOSE: 108 mg/dL (ref 70–140)
POTASSIUM: 4.1 meq/L (ref 3.5–5.1)
SODIUM: 137 meq/L (ref 136–145)
TOTAL PROTEIN: 7 g/dL (ref 6.4–8.3)
Total Bilirubin: 0.49 mg/dL (ref 0.20–1.20)

## 2015-10-26 MED ORDER — FOSAPREPITANT DIMEGLUMINE INJECTION 150 MG
Freq: Once | INTRAVENOUS | Status: AC
  Administered 2015-10-26: 13:00:00 via INTRAVENOUS
  Filled 2015-10-26: qty 5

## 2015-10-26 MED ORDER — FAMOTIDINE IN NACL 20-0.9 MG/50ML-% IV SOLN
INTRAVENOUS | Status: AC
  Filled 2015-10-26: qty 50

## 2015-10-26 MED ORDER — SODIUM CHLORIDE 0.9 % IV SOLN
Freq: Once | INTRAVENOUS | Status: AC
  Administered 2015-10-26: 13:00:00 via INTRAVENOUS

## 2015-10-26 MED ORDER — ATROPINE SULFATE 1 MG/ML IJ SOLN
INTRAMUSCULAR | Status: AC
  Filled 2015-10-26: qty 1

## 2015-10-26 MED ORDER — SODIUM CHLORIDE 0.9 % IV SOLN
5.0000 mg/kg | Freq: Once | INTRAVENOUS | Status: AC
  Administered 2015-10-26: 325 mg via INTRAVENOUS
  Filled 2015-10-26: qty 13

## 2015-10-26 MED ORDER — ATROPINE SULFATE 1 MG/ML IJ SOLN
0.5000 mg | Freq: Once | INTRAMUSCULAR | Status: AC | PRN
  Administered 2015-10-26: 0.5 mg via INTRAVENOUS

## 2015-10-26 MED ORDER — LEUCOVORIN CALCIUM INJECTION 350 MG
300.0000 mg/m2 | Freq: Once | INTRAMUSCULAR | Status: AC
  Administered 2015-10-26: 544 mg via INTRAVENOUS
  Filled 2015-10-26: qty 27.2

## 2015-10-26 MED ORDER — IRINOTECAN HCL CHEMO INJECTION 100 MG/5ML
180.0000 mg/m2 | Freq: Once | INTRAVENOUS | Status: AC
  Administered 2015-10-26: 326 mg via INTRAVENOUS
  Filled 2015-10-26: qty 5.43

## 2015-10-26 MED ORDER — SODIUM CHLORIDE 0.9% FLUSH
10.0000 mL | INTRAVENOUS | Status: DC | PRN
  Administered 2015-10-26: 10 mL via INTRAVENOUS
  Filled 2015-10-26: qty 10

## 2015-10-26 MED ORDER — FLUOROURACIL CHEMO INJECTION 2.5 GM/50ML
300.0000 mg/m2 | Freq: Once | INTRAVENOUS | Status: AC
  Administered 2015-10-26: 550 mg via INTRAVENOUS
  Filled 2015-10-26: qty 11

## 2015-10-26 MED ORDER — DIPHENHYDRAMINE HCL 50 MG/ML IJ SOLN
INTRAMUSCULAR | Status: AC
  Filled 2015-10-26: qty 1

## 2015-10-26 MED ORDER — PALONOSETRON HCL INJECTION 0.25 MG/5ML
INTRAVENOUS | Status: AC
  Filled 2015-10-26: qty 5

## 2015-10-26 MED ORDER — SODIUM CHLORIDE 0.9 % IV SOLN
1800.0000 mg/m2 | INTRAVENOUS | Status: DC
  Administered 2015-10-26: 3250 mg via INTRAVENOUS
  Filled 2015-10-26: qty 65

## 2015-10-26 MED ORDER — FAMOTIDINE IN NACL 20-0.9 MG/50ML-% IV SOLN
20.0000 mg | Freq: Two times a day (BID) | INTRAVENOUS | Status: DC
  Administered 2015-10-26: 20 mg via INTRAVENOUS

## 2015-10-26 MED ORDER — DIPHENHYDRAMINE HCL 50 MG/ML IJ SOLN
25.0000 mg | Freq: Once | INTRAMUSCULAR | Status: AC
  Administered 2015-10-26: 25 mg via INTRAVENOUS

## 2015-10-26 MED ORDER — PALONOSETRON HCL INJECTION 0.25 MG/5ML
0.2500 mg | Freq: Once | INTRAVENOUS | Status: AC
  Administered 2015-10-26: 0.25 mg via INTRAVENOUS

## 2015-10-26 NOTE — Patient Instructions (Signed)
Martha Becker Discharge Instructions for Patients Receiving Chemotherapy  Today you received the following chemotherapy agents Avastin, Irinotecan, Leucovorin, Fluouracil  To help prevent nausea and vomiting after your treatment, we encourage you to take your nausea medication as directed by your MD.  If you develop nausea and vomiting that is not controlled by your nausea medication, call the clinic.   BELOW ARE SYMPTOMS THAT SHOULD BE REPORTED IMMEDIATELY:  *FEVER GREATER THAN 100.5 F  *CHILLS WITH OR WITHOUT FEVER  NAUSEA AND VOMITING THAT IS NOT CONTROLLED WITH YOUR NAUSEA MEDICATION  *UNUSUAL SHORTNESS OF BREATH  *UNUSUAL BRUISING OR BLEEDING  TENDERNESS IN MOUTH AND THROAT WITH OR WITHOUT PRESENCE OF ULCERS  *URINARY PROBLEMS  *BOWEL PROBLEMS  UNUSUAL RASH Items with * indicate a potential emergency and should be followed up as soon as possible.  Feel free to call the clinic you have any questions or concerns. The clinic phone number is [phone].  Please show the [place] at check-in to the Emergency Department and triage nurse.  Irinotecan injection What is this medicine? IRINOTECAN (ir in oh TEE kan ) is a chemotherapy drug. It is used to treat colon and rectal cancer. This medicine may be used for other purposes; ask your health care provider or pharmacist if you have questions. What should I tell my health care provider before I take this medicine? They need to know if you have any of these conditions: -blood disorders -dehydration -diarrhea -infection (especially a virus infection such as chickenpox, cold sores, or herpes) -liver disease -low blood counts, like low white cell, platelet, or red cell counts -recent or ongoing radiation therapy -an unusual or allergic reaction to irinotecan, sorbitol, other chemotherapy, other medicines, foods, dyes, or preservatives -pregnant or trying to get pregnant -breast-feeding How should I  use this medicine? This drug is given as an infusion into a vein. It is administered in a hospital or clinic by a specially trained health care professional. Talk to your pediatrician regarding the use of this medicine in children. Special care may be needed. Overdosage: If you think you have taken too much of this medicine contact a poison control center or emergency room at once. NOTE: This medicine is only for you. Do not share this medicine with others. What if I miss a dose? It is important not to miss your dose. Call your doctor or health care professional if you are unable to keep an appointment. What may interact with this medicine? Do not take this medicine with any of the following medications: -atazanavir -certain medicines for fungal infections like itraconazole and ketoconazole -St. John's Wort This medicine may also interact with the following medications: -dexamethasone -diuretics -laxatives -medicines for seizures like carbamazepine, mephobarbital, phenobarbital, phenytoin, primidone -medicines to increase blood counts like filgrastim, pegfilgrastim, sargramostim -prochlorperazine -vaccines This list may not describe all possible interactions. Give your health care provider a list of all the medicines, herbs, non-prescription drugs, or dietary supplements you use. Also tell them if you smoke, drink alcohol, or use illegal drugs. Some items may interact with your medicine. What should I watch for while using this medicine? Your condition will be monitored carefully while you are receiving this medicine. You will need important blood work done while you are taking this medicine. This drug may make you feel generally unwell. This is not uncommon, as chemotherapy can affect healthy cells as well as cancer cells. Report any side effects. Continue your course of treatment even though you feel  ill unless your doctor tells you to stop. In some cases, you may be given additional  medicines to help with side effects. Follow all directions for their use. You may get drowsy or dizzy. Do not drive, use machinery, or do anything that needs mental alertness until you know how this medicine affects you. Do not stand or sit up quickly, especially if you are an older patient. This reduces the risk of dizzy or fainting spells. Call your doctor or health care professional for advice if you get a fever, chills or sore throat, or other symptoms of a cold or flu. Do not treat yourself. This drug decreases your body's ability to fight infections. Try to avoid being around people who are sick. This medicine may increase your risk to bruise or bleed. Call your doctor or health care professional if you notice any unusual bleeding. Be careful brushing and flossing your teeth or using a toothpick because you may get an infection or bleed more easily. If you have any dental work done, tell your dentist you are receiving this medicine. Avoid taking products that contain aspirin, acetaminophen, ibuprofen, naproxen, or ketoprofen unless instructed by your doctor. These medicines may hide a fever. Do not become pregnant while taking this medicine. Women should inform their doctor if they wish to become pregnant or think they might be pregnant. There is a potential for serious side effects to an unborn child. Talk to your health care professional or pharmacist for more information. Do not breast-feed an infant while taking this medicine. What side effects may I notice from receiving this medicine? Side effects that you should report to your doctor or health care professional as soon as possible: -allergic reactions like skin rash, itching or hives, swelling of the face, lips, or tongue -low blood counts - this medicine may decrease the number of white blood cells, red blood cells and platelets. You may be at increased risk for infections and bleeding. -signs of infection - fever or chills, cough, sore  throat, pain or difficulty passing urine -signs of decreased platelets or bleeding - bruising, pinpoint red spots on the skin, black, tarry stools, blood in the urine -signs of decreased red blood cells - unusually weak or tired, fainting spells, lightheadedness -breathing problems -chest pain -diarrhea -feeling faint or lightheaded, falls -flushing, runny nose, sweating during infusion -mouth sores or pain -pain, swelling, redness or irritation where injected -pain, swelling, warmth in the leg -pain, tingling, numbness in the hands or feet -problems with balance, talking, walking -stomach cramps, pain -trouble passing urine or change in the amount of urine -vomiting as to be unable to hold down drinks or food -yellowing of the eyes or skin Side effects that usually do not require medical attention (report to your doctor or health care professional if they continue or are bothersome): -constipation -hair loss -headache -loss of appetite -nausea, vomiting -stomach upset This list may not describe all possible side effects. Call your doctor for medical advice about side effects. You may report side effects to FDA at 1-800-FDA-1088. Where should I keep my medicine? This drug is given in a hospital or clinic and will not be stored at home. NOTE: This sheet is a summary. It may not cover all possible information. If you have questions about this medicine, talk to your doctor, pharmacist, or health care provider.    2016, Elsevier/Gold Standard. (2013-02-24 16:29:32)

## 2015-10-26 NOTE — Progress Notes (Addendum)
[place] OFFICE PROGRESS NOTE   Diagnosis:  Colon cancer  INTERVAL HISTORY:   Martha Becker returns as scheduled. She completed cycle 1 FOLFIRI/Avastin 10/12/2015. She had mild nausea relieved with home antiemetic. One episode of diarrhea. No crampy abdominal pain. She did have some diaphoresis and rhinorrhea. She developed shortness of breath as she was leaving the [place]. No chest pain. No wheezing or hives. She was evaluated in the emergency department. Chest CT was negative. She reports persistent mild dyspnea on exertion. She has a cough that "comes and goes". No fever.  Objective:  Vital signs in last 24 hours:  Blood pressure 123/73, pulse 86, temperature 98.2 F (36.8 C), temperature source Oral, resp. rate 18, height 5' 6"  (1.676 m), weight 155 lb 14.4 oz (70.716 kg), SpO2 98 %.    HEENT: No thrush or ulcers. Resp: Lungs clear bilaterally. No respiratory distress. Cardio: Regular rate and rhythm. GI: Abdomen soft and nontender. No hepatomegaly. Vascular: No leg edema. Calves soft and nontender. Port-A-Cath without erythema.  Lab Results:  Lab Results  Component Value Date   WBC 3.5* 10/26/2015   HGB 11.6 10/26/2015   HCT 34.9 10/26/2015   MCV 90.7 10/26/2015   PLT 189 10/26/2015   NEUTROABS 1.6 10/26/2015    Imaging:  No results found.  Medications: I have reviewed the patient's current medications.  Assessment/Plan: 1. 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, status post a right colectomy 08/31/2014  no loss of mismatch repair protein expression, microsatellite stable  K-ras wild-type by standard testing,NRAS Q61K mutation identified on Foundation 1 testing  APC alteration detected  Staging CT scans December 2015 and PET scan 09/22/2014 consistent with metastatic bilateral lung nodules, metastatic retroperitoneal lymphadenopathy, and a probable left liver metastasis  Cycle 1  CAPOX 10/29/2014 (Xeloda discontinued day 9 secondary to diarrhea)   CT 11/14/2014 revealed progressive metastatic disease at the lung bases and liver compared to a PET scan from 09/22/2014, mild improvement in retroperitoneal lymphadenopathy  Cycle 1 FOLFOX 12/02/2014  Cycle 2 FOLFOX, Avastin added 12/16/2014  Cycle 3 FOLFOX/Avastin 12/30/2014  Cycle 4 FOLFOX/Avastin 01/13/2015  Cycle 5 FOLFOX/Avastin 01/27/2015  CT 02/10/2015 with improvement in hepatic metastases, mild improvement of lung metastases and retroperitoneal lymphadenopathy  Cycle 6 FOLFOX/Avastin 02/16/2015  Cycle 7 FOLFOX/Avastin 03/02/2015  Cycle 8 FOLFOX/Avastin 03/16/2015  Cycle 9 FOLFOX/Avastin 04/13/2015  Restaging CTs 04/20/2015 with a decrease in the size of pulmonary nodules, liver lesions, and retroperitoneal lymph nodes  Restaging CTs 07/27/2015 revealed enlargement of bilateral lung nodules, stable liver lesions, decreased periaortic adenopathy  CTs 10/05/2015 revealed further enlargement of bilateral lung nodules ,enlargement of liver lesions, and enlargement of peritoneal lymph nodes  Cycle 1 FOLFIRI/Avastin 10/12/2015  Cycle 2 FOLFIRI/Avastin 10/26/2015  2. Elevated TSH, PET scan 09/22/2014 with diffuse increased metabolic uptake in the thyroid   3. Family history of multiple cancers including colon cancer in her paternal grandfather and rectal cancer in a paternal first cousin  69. Admission 11/14/2014 with nausea and diarrhea, most likely secondary to capecitabine induced enteritis-resolved. CT 11/14/2014 consistent with ileitis  5.Superficial phlebitis left hand/wrist 12/30/2014.  6. Early oxaliplatin neuropathy  7. Pain at the right gluteus and right leg-MRI of the lumbar spine 07/13/2015 confirmed a disc protrusion at L5-S1  8.    Diaphoresis, rhinorrhea, shortness of breath and cough following cycle 1 FOLFIRI/Avastin     Disposition: Martha Becker has completed 1  cycle of FOLFIRI/Avastin. She developed diaphoresis, rhinorrhea, shortness of  breath and cough after leaving the office following cycle 1 FOLFIRI/Avastin. 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 She further understands the etiology of the shortness of breath and cough is unclear. We discussed her symptoms with the cancer center pharmacist. She identified 1 case report of interstitial lung disease after 1 dose of irinotecan and reports of cough and shortness of breath in up to 20% of individuals receiving irinotecan. Martha Becker understands the cough and shortness of breath could occur again and possibly be more severe. She is willing to accept this risk. She will receive atropine, Pepcid and Benadryl as premedications with today's treatment. She understands to seek emergency evaluation if she develops worsening shortness of breath or other concerning symptoms.  Of note, we discussed alternatives to FOLFIRI/Avastin including Lonsurf and referral for a clinical trial. As noted above she would like to proceed with FOLFIRI/Avastin.  She will return for a follow-up visit in 2 weeks. She will contact the office the interim with any problems.  Patient seen with Dr. Benay Spice. 45 minutes were spent face-to-face at today's visit with the majority of that time involved in counseling/correlation of care.    Ned Card ANP/GNP-BC   10/26/2015  11:01 AM   This was a shared visit with Ned Card. The etiology of the dyspnea following the first cycle of FOLFIRI is unclear. This would be an unusual acute toxicity associated with the FOLFIRI regimen. She did not appear to have a "allergic "type reaction, but we decided to premedicate her with Pepcid and Benadryl today. She will also receive atropine as she described cholinergic symptoms after receiving irinotecan.  [name], M.D.

## 2015-10-27 ENCOUNTER — Other Ambulatory Visit: Payer: Self-pay | Admitting: Nurse Practitioner

## 2015-10-27 LAB — CEA: CEA1: 5.5 ng/mL — AB (ref 0.0–4.7)

## 2015-10-27 LAB — CEA (PARALLEL TESTING): CEA: 3.5 ng/mL

## 2015-10-28 ENCOUNTER — Encounter: Payer: Self-pay | Admitting: *Deleted

## 2015-10-28 ENCOUNTER — Ambulatory Visit (HOSPITAL_BASED_OUTPATIENT_CLINIC_OR_DEPARTMENT_OTHER): Payer: 59

## 2015-10-28 ENCOUNTER — Telehealth: Payer: Self-pay | Admitting: *Deleted

## 2015-10-28 VITALS — BP 127/79 | HR 70 | Temp 98.0°F

## 2015-10-28 DIAGNOSIS — C7802 Secondary malignant neoplasm of left lung: Secondary | ICD-10-CM

## 2015-10-28 DIAGNOSIS — C7801 Secondary malignant neoplasm of right lung: Secondary | ICD-10-CM | POA: Diagnosis not present

## 2015-10-28 DIAGNOSIS — C787 Secondary malignant neoplasm of liver and intrahepatic bile duct: Secondary | ICD-10-CM

## 2015-10-28 DIAGNOSIS — C772 Secondary and unspecified malignant neoplasm of intra-abdominal lymph nodes: Secondary | ICD-10-CM

## 2015-10-28 DIAGNOSIS — C180 Malignant neoplasm of cecum: Secondary | ICD-10-CM

## 2015-10-28 DIAGNOSIS — C182 Malignant neoplasm of ascending colon: Secondary | ICD-10-CM

## 2015-10-28 MED ORDER — HEPARIN SOD (PORK) LOCK FLUSH 100 UNIT/ML IV SOLN
500.0000 [IU] | Freq: Once | INTRAVENOUS | Status: AC | PRN
  Administered 2015-10-28: 500 [IU]
  Filled 2015-10-28: qty 5

## 2015-10-28 MED ORDER — SODIUM CHLORIDE 0.9% FLUSH
10.0000 mL | INTRAVENOUS | Status: DC | PRN
  Administered 2015-10-28: 10 mL
  Filled 2015-10-28: qty 10

## 2015-10-28 NOTE — Telephone Encounter (Signed)
Per staff message and POF I have scheduled appts. Advised scheduler of appts. JMW  

## 2015-10-28 NOTE — Progress Notes (Signed)
Oncology Nurse Navigator Documentation  Oncology Nurse Navigator Flowsheets 10/28/2015  Navigator Location [facility]  Navigator Encounter Type Other  Telephone -  Treatment Initiated Date -  Patient Visit Type Other  Treatment Phase Treatment  Barriers/Navigation Needs Family concerns  Education Symptom Management-continued concern and extreme anxiety about hair loss.  Interventions -Provided wig voucher and script for cranial prosthesis; supportive listening and encouraged to focus on her longevity is more important than hair. Made her aware it will be gradual thinning over time. She will have time to adjust.  Referrals -  Education Method -  Support Groups/Services -  Specialty Items/DME Wig script and voucher provided  Acuity -  Time Spent with Patient 15  Time Spent with Patient (Retired) -

## 2015-10-28 NOTE — Progress Notes (Signed)
[place] Work  Clinical Social Work was referred by patient for assessment of psychosocial needs due to concerns re hair loss. CSW spoke with pt over phone yesterday at length and provided supportive listening, problem solving and resources to assist with wigs.  CSW to follow and left wig voucher at desk and provided pt with wig shop number for fitting of wig at [facility] [place].    Clinical Social Work interventions: Supportive listening Resource assistance  Loren Racer, [place] Worker [place]  [place] Phone: [phone] Fax: [phone]

## 2015-11-01 ENCOUNTER — Telehealth: Payer: Self-pay | Admitting: *Deleted

## 2015-11-01 NOTE — Telephone Encounter (Signed)
Per staff message and POF I have scheduled appts. Advised scheduler of appts. JMW  

## 2015-11-07 ENCOUNTER — Other Ambulatory Visit: Payer: Self-pay | Admitting: Oncology

## 2015-11-09 ENCOUNTER — Telehealth: Payer: Self-pay | Admitting: Oncology

## 2015-11-09 ENCOUNTER — Ambulatory Visit (HOSPITAL_BASED_OUTPATIENT_CLINIC_OR_DEPARTMENT_OTHER): Payer: 59 | Admitting: Oncology

## 2015-11-09 ENCOUNTER — Encounter: Payer: Self-pay | Admitting: *Deleted

## 2015-11-09 ENCOUNTER — Ambulatory Visit (HOSPITAL_BASED_OUTPATIENT_CLINIC_OR_DEPARTMENT_OTHER): Payer: 59

## 2015-11-09 ENCOUNTER — Ambulatory Visit: Payer: 59

## 2015-11-09 ENCOUNTER — Other Ambulatory Visit (HOSPITAL_BASED_OUTPATIENT_CLINIC_OR_DEPARTMENT_OTHER): Payer: 59

## 2015-11-09 VITALS — BP 130/88 | HR 113 | Temp 98.4°F | Resp 19 | Ht 66.0 in | Wt 153.0 lb

## 2015-11-09 VITALS — HR 80

## 2015-11-09 DIAGNOSIS — Z5112 Encounter for antineoplastic immunotherapy: Secondary | ICD-10-CM | POA: Diagnosis not present

## 2015-11-09 DIAGNOSIS — C180 Malignant neoplasm of cecum: Secondary | ICD-10-CM

## 2015-11-09 DIAGNOSIS — C7801 Secondary malignant neoplasm of right lung: Secondary | ICD-10-CM | POA: Diagnosis not present

## 2015-11-09 DIAGNOSIS — C787 Secondary malignant neoplasm of liver and intrahepatic bile duct: Secondary | ICD-10-CM

## 2015-11-09 DIAGNOSIS — C786 Secondary malignant neoplasm of retroperitoneum and peritoneum: Secondary | ICD-10-CM

## 2015-11-09 DIAGNOSIS — R05 Cough: Secondary | ICD-10-CM

## 2015-11-09 DIAGNOSIS — C7802 Secondary malignant neoplasm of left lung: Secondary | ICD-10-CM | POA: Diagnosis not present

## 2015-11-09 DIAGNOSIS — G620 Drug-induced polyneuropathy: Secondary | ICD-10-CM

## 2015-11-09 DIAGNOSIS — C801 Malignant (primary) neoplasm, unspecified: Secondary | ICD-10-CM

## 2015-11-09 DIAGNOSIS — C182 Malignant neoplasm of ascending colon: Secondary | ICD-10-CM

## 2015-11-09 DIAGNOSIS — Z800 Family history of malignant neoplasm of digestive organs: Secondary | ICD-10-CM

## 2015-11-09 DIAGNOSIS — K1231 Oral mucositis (ulcerative) due to antineoplastic therapy: Secondary | ICD-10-CM

## 2015-11-09 DIAGNOSIS — Z95828 Presence of other vascular implants and grafts: Secondary | ICD-10-CM

## 2015-11-09 DIAGNOSIS — Z5111 Encounter for antineoplastic chemotherapy: Secondary | ICD-10-CM | POA: Diagnosis not present

## 2015-11-09 DIAGNOSIS — C7800 Secondary malignant neoplasm of unspecified lung: Secondary | ICD-10-CM

## 2015-11-09 DIAGNOSIS — Z8672 Personal history of thrombophlebitis: Secondary | ICD-10-CM

## 2015-11-09 LAB — CBC WITH DIFFERENTIAL/PLATELET
BASO%: 0.9 % (ref 0.0–2.0)
Basophils Absolute: 0 10*3/uL (ref 0.0–0.1)
EOS ABS: 0.1 10*3/uL (ref 0.0–0.5)
EOS%: 1.6 % (ref 0.0–7.0)
HEMATOCRIT: 35.2 % (ref 34.8–46.6)
HEMOGLOBIN: 11.8 g/dL (ref 11.6–15.9)
LYMPH#: 1.5 10*3/uL (ref 0.9–3.3)
LYMPH%: 39.4 % (ref 14.0–49.7)
MCH: 30.1 pg (ref 25.1–34.0)
MCHC: 33.4 g/dL (ref 31.5–36.0)
MCV: 90 fL (ref 79.5–101.0)
MONO#: 0.5 10*3/uL (ref 0.1–0.9)
MONO%: 13.8 % (ref 0.0–14.0)
NEUT%: 44.3 % (ref 38.4–76.8)
NEUTROS ABS: 1.7 10*3/uL (ref 1.5–6.5)
PLATELETS: 222 10*3/uL (ref 145–400)
RBC: 3.91 10*6/uL (ref 3.70–5.45)
RDW: 14.6 % — AB (ref 11.2–14.5)
WBC: 3.8 10*3/uL — AB (ref 3.9–10.3)

## 2015-11-09 LAB — COMPREHENSIVE METABOLIC PANEL
ALBUMIN: 3.8 g/dL (ref 3.5–5.0)
ALK PHOS: 92 U/L (ref 40–150)
ALT: 12 U/L (ref 0–55)
ANION GAP: 10 meq/L (ref 3–11)
AST: 13 U/L (ref 5–34)
BILIRUBIN TOTAL: 0.44 mg/dL (ref 0.20–1.20)
BUN: 14 mg/dL (ref 7.0–26.0)
CO2: 25 meq/L (ref 22–29)
CREATININE: 0.9 mg/dL (ref 0.6–1.1)
Calcium: 9.8 mg/dL (ref 8.4–10.4)
Chloride: 103 mEq/L (ref 98–109)
EGFR: 75 mL/min/{1.73_m2} — ABNORMAL LOW (ref >90)
Glucose: 85 mg/dl (ref 70–140)
Potassium: 4.1 mEq/L (ref 3.5–5.1)
Sodium: 137 mEq/L (ref 136–145)
TOTAL PROTEIN: 7.1 g/dL (ref 6.4–8.3)

## 2015-11-09 MED ORDER — DIPHENHYDRAMINE HCL 50 MG/ML IJ SOLN
INTRAMUSCULAR | Status: AC
  Filled 2015-11-09: qty 1

## 2015-11-09 MED ORDER — ATROPINE SULFATE 1 MG/ML IJ SOLN
INTRAMUSCULAR | Status: AC
  Filled 2015-11-09: qty 1

## 2015-11-09 MED ORDER — SODIUM CHLORIDE 0.9 % IV SOLN
Freq: Once | INTRAVENOUS | Status: AC
  Administered 2015-11-09: 13:00:00 via INTRAVENOUS
  Filled 2015-11-09: qty 5

## 2015-11-09 MED ORDER — PALONOSETRON HCL INJECTION 0.25 MG/5ML
0.2500 mg | Freq: Once | INTRAVENOUS | Status: AC
  Administered 2015-11-09: 0.25 mg via INTRAVENOUS

## 2015-11-09 MED ORDER — FAMOTIDINE IN NACL 20-0.9 MG/50ML-% IV SOLN
20.0000 mg | Freq: Two times a day (BID) | INTRAVENOUS | Status: DC
  Administered 2015-11-09: 20 mg via INTRAVENOUS

## 2015-11-09 MED ORDER — PALONOSETRON HCL INJECTION 0.25 MG/5ML
INTRAVENOUS | Status: AC
  Filled 2015-11-09: qty 5

## 2015-11-09 MED ORDER — DEXTROSE 5 % IV SOLN
300.0000 mg/m2 | Freq: Once | INTRAVENOUS | Status: AC
  Administered 2015-11-09: 544 mg via INTRAVENOUS
  Filled 2015-11-09: qty 27.2

## 2015-11-09 MED ORDER — DIPHENHYDRAMINE HCL 50 MG/ML IJ SOLN
25.0000 mg | Freq: Once | INTRAMUSCULAR | Status: AC
  Administered 2015-11-09: 25 mg via INTRAVENOUS

## 2015-11-09 MED ORDER — ATROPINE SULFATE 1 MG/ML IJ SOLN
0.5000 mg | Freq: Once | INTRAMUSCULAR | Status: AC | PRN
  Administered 2015-11-09: 0.5 mg via INTRAVENOUS

## 2015-11-09 MED ORDER — FLUOROURACIL CHEMO INJECTION 2.5 GM/50ML
300.0000 mg/m2 | Freq: Once | INTRAVENOUS | Status: AC
  Administered 2015-11-09: 550 mg via INTRAVENOUS
  Filled 2015-11-09: qty 11

## 2015-11-09 MED ORDER — PROCHLORPERAZINE MALEATE 10 MG PO TABS
ORAL_TABLET | ORAL | Status: AC
  Filled 2015-11-09: qty 1

## 2015-11-09 MED ORDER — SODIUM CHLORIDE 0.9% FLUSH
10.0000 mL | INTRAVENOUS | Status: DC | PRN
  Administered 2015-11-09: 10 mL via INTRAVENOUS
  Filled 2015-11-09: qty 10

## 2015-11-09 MED ORDER — IRINOTECAN HCL CHEMO INJECTION 100 MG/5ML
180.0000 mg/m2 | Freq: Once | INTRAVENOUS | Status: AC
  Administered 2015-11-09: 326 mg via INTRAVENOUS
  Filled 2015-11-09: qty 5.43

## 2015-11-09 MED ORDER — SODIUM CHLORIDE 0.9 % IV SOLN
5.0000 mg/kg | Freq: Once | INTRAVENOUS | Status: AC
  Administered 2015-11-09: 325 mg via INTRAVENOUS
  Filled 2015-11-09: qty 13

## 2015-11-09 MED ORDER — PROCHLORPERAZINE MALEATE 10 MG PO TABS
10.0000 mg | ORAL_TABLET | Freq: Four times a day (QID) | ORAL | Status: DC | PRN
  Administered 2015-11-09: 10 mg via ORAL

## 2015-11-09 MED ORDER — FAMOTIDINE IN NACL 20-0.9 MG/50ML-% IV SOLN
INTRAVENOUS | Status: AC
  Filled 2015-11-09: qty 50

## 2015-11-09 MED ORDER — SODIUM CHLORIDE 0.9 % IV SOLN
1800.0000 mg/m2 | INTRAVENOUS | Status: DC
  Administered 2015-11-09: 3250 mg via INTRAVENOUS
  Filled 2015-11-09: qty 65

## 2015-11-09 MED ORDER — SODIUM CHLORIDE 0.9 % IV SOLN
Freq: Once | INTRAVENOUS | Status: AC
Start: 2015-11-09 — End: 2015-11-09
  Administered 2015-11-09: 13:00:00 via INTRAVENOUS

## 2015-11-09 NOTE — Telephone Encounter (Signed)
appt made. Schedule will be printed in treatment room

## 2015-11-09 NOTE — Progress Notes (Signed)
[place] OFFICE PROGRESS NOTE   Diagnosis: Colon cancer  INTERVAL HISTORY:   Martha Becker as scheduled. She completed another cycle of FOLFIRI/Avastin on 10/26/2015. She did not have acute dyspnea or cholinergic symptoms following this cycle. No diarrhea. She has developed a sore at the left buccal mucosa over the past few days. She continues to have an intermittent cough. She reports the cough was present prior to beginning FOLFIRI. The cough is generally nonproductive. No dyspnea.  Objective:  Vital signs in last 24 hours:  Blood pressure 130/88, pulse 113, temperature 98.4 F (36.9 C), temperature source Oral, resp. rate 19, height 5' 6"  (1.676 m), weight 153 lb (69.4 kg), SpO2 99 %.    HEENT: Healed ulceration at the central palate, healing ulcer at the left upper buccal mucosa, no thrush Resp: Lungs with inspiratory rales at the left posterior base, no respiratory distress, good air movement bilaterally Cardio: Regular rate and rhythm GI: No hepatosplenomegaly Vascular: No leg edema    Portacath/PICC-without erythema  Lab Results:  Lab Results  Component Value Date   WBC 3.8* 11/09/2015   HGB 11.8 11/09/2015   HCT 35.2 11/09/2015   MCV 90.0 11/09/2015   PLT 222 11/09/2015   NEUTROABS 1.7 11/09/2015      Lab Results  Component Value Date   CEA1 5.5* 10/26/2015     Medications: I have reviewed the patient's current medications.  Assessment/Plan: 1. 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, status post a right colectomy 08/31/2014  no loss of mismatch repair protein expression, microsatellite stable  K-ras wild-type by standard testing,NRAS Q61K mutation identified on Foundation 1 testing  APC alteration detected  Staging CT scans December 2015 and PET scan 09/22/2014 consistent with metastatic bilateral lung nodules, metastatic retroperitoneal lymphadenopathy, and a probable left  liver metastasis  Cycle 1 CAPOX 10/29/2014 (Xeloda discontinued day 9 secondary to diarrhea)   CT 11/14/2014 revealed progressive metastatic disease at the lung bases and liver compared to a PET scan from 09/22/2014, mild improvement in retroperitoneal lymphadenopathy  Cycle 1 FOLFOX 12/02/2014  Cycle 2 FOLFOX, Avastin added 12/16/2014  Cycle 3 FOLFOX/Avastin 12/30/2014  Cycle 4 FOLFOX/Avastin 01/13/2015  Cycle 5 FOLFOX/Avastin 01/27/2015  CT 02/10/2015 with improvement in hepatic metastases, mild improvement of lung metastases and retroperitoneal lymphadenopathy  Cycle 6 FOLFOX/Avastin 02/16/2015  Cycle 7 FOLFOX/Avastin 03/02/2015  Cycle 8 FOLFOX/Avastin 03/16/2015  Cycle 9 FOLFOX/Avastin 04/13/2015  Restaging CTs 04/20/2015 with a decrease in the size of pulmonary nodules, liver lesions, and retroperitoneal lymph nodes  Restaging CTs 07/27/2015 revealed enlargement of bilateral lung nodules, stable liver lesions, decreased periaortic adenopathy  CTs 10/05/2015 revealed further enlargement of bilateral lung nodules ,enlargement of liver lesions, and enlargement of peritoneal lymph nodes  Cycle 1 FOLFIRI/Avastin 10/12/2015  Cycle 2 FOLFIRI/Avastin 10/26/2015  Cycle 3 FOLFIRI/Avastin 11/09/2015  2. Elevated TSH, PET scan 09/22/2014 with diffuse increased metabolic uptake in the thyroid   3. Family history of multiple cancers including colon cancer in her paternal grandfather and rectal cancer in a paternal first cousin  47. Admission 11/14/2014 with nausea and diarrhea, most likely secondary to capecitabine induced enteritis-resolved. CT 11/14/2014 consistent with ileitis  5.Superficial phlebitis left hand/wrist 12/30/2014.  6. Early oxaliplatin neuropathy  7. Pain at the right gluteus and right leg-MRI of the lumbar spine 07/13/2015 confirmed a disc protrusion at L5-S1  8. Diaphoresis, rhinorrhea, shortness of breath and cough following cycle 1  FOLFIRI/Avastin, did not occur following cycle 2  9.  Oral mucositis secondary to chemotherapy  10.  Nonproductive cough-most likely secondary to colon cancer     Disposition:  She has competed 2 cycles of FOLFIRI/Avastin. She tolerated cycle 2 well aside from mild mucositis. The plan is to proceed with cycle 3 today. The plan is to proceed with a restaging CT after cycle 5. We will schedule the CT after cycle 4 if the cough is not improved.  She will return for an office visit and chemotherapy in 2 weeks. She will contact us in the interim for a progressive cough or recurrent mucositis.  Betsy Coder, MD  11/09/2015  12:27 PM

## 2015-11-09 NOTE — Progress Notes (Signed)
Oncology Nurse Navigator Documentation  Oncology Nurse Navigator Flowsheets 11/09/2015  Navigator Location [facility]  Navigator Encounter Type Follow-up Appt  Telephone -  Treatment Initiated Date -  Patient Visit Type MedOnc  Treatment Phase Active Tx--FOLFIRI/Avastin #3  Barriers/Navigation Needs Family concerns  Education Coping with Diagnosis/ Prognosis--mother to SNF 3/1 and dad has had a stroke  Interventions Other--supportive listening; encouraged her to let her elder brother take responsibility for this right now.   Referrals -  Education Method -  Support Groups/Services -  Specialty Items/DME -  Acuity Level 2  Time Spent with Patient 30  Time Spent with Patient (Retired) -

## 2015-11-09 NOTE — Progress Notes (Signed)
Pt states that she is having increased nausea and is requesting Compazine 10mg  tablet.  Dr. Benay Spice notified and order received to give pt Compazine 10 mg PO now.

## 2015-11-09 NOTE — Patient Instructions (Signed)
[facility] Discharge Instructions for Patients Receiving Chemotherapy  Today you received the following chemotherapy agents Avastin/Irinotecan/Leucovorin/Fluorouracil.  To help prevent nausea and vomiting after your treatment, we encourage you to take your nausea medication as directed.   If you develop nausea and vomiting that is not controlled by your nausea medication, call the clinic.   BELOW ARE SYMPTOMS THAT SHOULD BE REPORTED IMMEDIATELY:  *FEVER GREATER THAN 100.5 F  *CHILLS WITH OR WITHOUT FEVER  NAUSEA AND VOMITING THAT IS NOT CONTROLLED WITH YOUR NAUSEA MEDICATION  *UNUSUAL SHORTNESS OF BREATH  *UNUSUAL BRUISING OR BLEEDING  TENDERNESS IN MOUTH AND THROAT WITH OR WITHOUT PRESENCE OF ULCERS  *URINARY PROBLEMS  *BOWEL PROBLEMS  UNUSUAL RASH Items with * indicate a potential emergency and should be followed up as soon as possible.  Feel free to call the clinic you have any questions or concerns. The clinic phone number is [phone].  Please show the CHEMO ALERT CARD at check-in to the Emergency Department and triage nurse.    

## 2015-11-09 NOTE — Patient Instructions (Signed)

## 2015-11-10 LAB — CEA: CEA: 4.3 ng/mL (ref 0.0–4.7)

## 2015-11-10 LAB — CEA (PARALLEL TESTING): CEA: 3.6 ng/mL — ABNORMAL HIGH

## 2015-11-11 ENCOUNTER — Ambulatory Visit (HOSPITAL_BASED_OUTPATIENT_CLINIC_OR_DEPARTMENT_OTHER): Payer: 59

## 2015-11-11 ENCOUNTER — Telehealth: Payer: Self-pay | Admitting: *Deleted

## 2015-11-11 ENCOUNTER — Encounter: Payer: Self-pay | Admitting: Oncology

## 2015-11-11 VITALS — BP 127/78 | HR 69 | Temp 97.9°F | Resp 18

## 2015-11-11 DIAGNOSIS — C787 Secondary malignant neoplasm of liver and intrahepatic bile duct: Secondary | ICD-10-CM | POA: Diagnosis not present

## 2015-11-11 DIAGNOSIS — C7801 Secondary malignant neoplasm of right lung: Secondary | ICD-10-CM

## 2015-11-11 DIAGNOSIS — C7802 Secondary malignant neoplasm of left lung: Secondary | ICD-10-CM | POA: Diagnosis not present

## 2015-11-11 DIAGNOSIS — C182 Malignant neoplasm of ascending colon: Secondary | ICD-10-CM | POA: Diagnosis not present

## 2015-11-11 DIAGNOSIS — C7800 Secondary malignant neoplasm of unspecified lung: Secondary | ICD-10-CM

## 2015-11-11 MED ORDER — SODIUM CHLORIDE 0.9% FLUSH
10.0000 mL | INTRAVENOUS | Status: DC | PRN
  Administered 2015-11-11: 10 mL via INTRAVENOUS
  Filled 2015-11-11: qty 10

## 2015-11-11 MED ORDER — HEPARIN SOD (PORK) LOCK FLUSH 100 UNIT/ML IV SOLN
500.0000 [IU] | Freq: Once | INTRAVENOUS | Status: AC
  Administered 2015-11-11: 500 [IU] via INTRAVENOUS
  Filled 2015-11-11: qty 5

## 2015-11-11 NOTE — Telephone Encounter (Signed)
Returned call to pt, hold on zoster vaccine, per Dr. Benay Spice. MD does not recommend steroid unless having delayed nausea. She voiced understanding.

## 2015-11-11 NOTE — Telephone Encounter (Signed)
Pt had questions while in the office for pump disconnect. Asks if she should have shingles vaccine? Sister-in-law recently diagnosed with shingles, pt read up on it and thinks she would be susceptible due to chemo. Pt also asking if she could take a low dose steroid after pump disconnect because the pre-med "really had me wired." Usually feels extreme fatigue and malaise after pump disconnect, thinks steroid would prevent this. She feels fine today so far but thinks the fatigue will begin in the next day or so.

## 2015-11-11 NOTE — Telephone Encounter (Signed)
Hold on zoster vaccine I would not give decadron unless she has delayed nausea

## 2015-11-11 NOTE — Progress Notes (Signed)
recd from ms. wilma

## 2015-11-12 ENCOUNTER — Encounter: Payer: Self-pay | Admitting: Oncology

## 2015-11-12 NOTE — Progress Notes (Signed)
left for dr. sherrill to sign °

## 2015-11-12 NOTE — Progress Notes (Signed)
Faxed [phone] and mailed copy to patient for their records- sent to medical records

## 2015-11-15 ENCOUNTER — Encounter: Payer: Self-pay | Admitting: *Deleted

## 2015-11-15 NOTE — Progress Notes (Signed)
[place] Work  Clinical Social Work left supportive message for pt. CSW was checking in and offering support due to family concerns. CSW will follow up at next week's appointments and awaits return call.   Loren Racer, [place] Worker [place]  [place] Phone: [phone] Fax: [phone]

## 2015-11-19 ENCOUNTER — Other Ambulatory Visit: Payer: Self-pay | Admitting: Oncology

## 2015-11-23 ENCOUNTER — Ambulatory Visit (HOSPITAL_BASED_OUTPATIENT_CLINIC_OR_DEPARTMENT_OTHER): Payer: 59

## 2015-11-23 ENCOUNTER — Other Ambulatory Visit (HOSPITAL_BASED_OUTPATIENT_CLINIC_OR_DEPARTMENT_OTHER): Payer: 59

## 2015-11-23 ENCOUNTER — Ambulatory Visit: Payer: 59

## 2015-11-23 ENCOUNTER — Ambulatory Visit (HOSPITAL_BASED_OUTPATIENT_CLINIC_OR_DEPARTMENT_OTHER): Payer: 59 | Admitting: Nurse Practitioner

## 2015-11-23 VITALS — BP 119/79 | HR 77 | Temp 98.4°F | Resp 18

## 2015-11-23 VITALS — BP 136/78 | HR 85 | Temp 98.3°F | Resp 18 | Ht 66.0 in | Wt 154.4 lb

## 2015-11-23 DIAGNOSIS — C787 Secondary malignant neoplasm of liver and intrahepatic bile duct: Secondary | ICD-10-CM

## 2015-11-23 DIAGNOSIS — C7802 Secondary malignant neoplasm of left lung: Secondary | ICD-10-CM

## 2015-11-23 DIAGNOSIS — Z800 Family history of malignant neoplasm of digestive organs: Secondary | ICD-10-CM

## 2015-11-23 DIAGNOSIS — C180 Malignant neoplasm of cecum: Secondary | ICD-10-CM

## 2015-11-23 DIAGNOSIS — Z8672 Personal history of thrombophlebitis: Secondary | ICD-10-CM

## 2015-11-23 DIAGNOSIS — C7801 Secondary malignant neoplasm of right lung: Secondary | ICD-10-CM

## 2015-11-23 DIAGNOSIS — G620 Drug-induced polyneuropathy: Secondary | ICD-10-CM

## 2015-11-23 DIAGNOSIS — C182 Malignant neoplasm of ascending colon: Secondary | ICD-10-CM

## 2015-11-23 DIAGNOSIS — C801 Malignant (primary) neoplasm, unspecified: Secondary | ICD-10-CM

## 2015-11-23 DIAGNOSIS — R05 Cough: Secondary | ICD-10-CM

## 2015-11-23 DIAGNOSIS — C786 Secondary malignant neoplasm of retroperitoneum and peritoneum: Secondary | ICD-10-CM

## 2015-11-23 DIAGNOSIS — Z95828 Presence of other vascular implants and grafts: Secondary | ICD-10-CM

## 2015-11-23 DIAGNOSIS — Z5111 Encounter for antineoplastic chemotherapy: Secondary | ICD-10-CM

## 2015-11-23 DIAGNOSIS — K1231 Oral mucositis (ulcerative) due to antineoplastic therapy: Secondary | ICD-10-CM

## 2015-11-23 DIAGNOSIS — Z5112 Encounter for antineoplastic immunotherapy: Secondary | ICD-10-CM | POA: Diagnosis not present

## 2015-11-23 DIAGNOSIS — C7800 Secondary malignant neoplasm of unspecified lung: Secondary | ICD-10-CM

## 2015-11-23 LAB — CBC WITH DIFFERENTIAL/PLATELET
BASO%: 1.1 % (ref 0.0–2.0)
BASOS ABS: 0 10*3/uL (ref 0.0–0.1)
EOS ABS: 0.1 10*3/uL (ref 0.0–0.5)
EOS%: 2.3 % (ref 0.0–7.0)
HEMATOCRIT: 35.1 % (ref 34.8–46.6)
HEMOGLOBIN: 11.7 g/dL (ref 11.6–15.9)
LYMPH#: 1.3 10*3/uL (ref 0.9–3.3)
LYMPH%: 36.4 % (ref 14.0–49.7)
MCH: 30.1 pg (ref 25.1–34.0)
MCHC: 33.3 g/dL (ref 31.5–36.0)
MCV: 90.3 fL (ref 79.5–101.0)
MONO#: 0.5 10*3/uL (ref 0.1–0.9)
MONO%: 13.3 % (ref 0.0–14.0)
NEUT#: 1.7 10*3/uL (ref 1.5–6.5)
NEUT%: 46.9 % (ref 38.4–76.8)
PLATELETS: 182 10*3/uL (ref 145–400)
RBC: 3.89 10*6/uL (ref 3.70–5.45)
RDW: 15.7 % — AB (ref 11.2–14.5)
WBC: 3.6 10*3/uL — ABNORMAL LOW (ref 3.9–10.3)

## 2015-11-23 LAB — UA PROTEIN, DIPSTICK - CHCC: PROTEIN: NEGATIVE mg/dL

## 2015-11-23 LAB — COMPREHENSIVE METABOLIC PANEL
ALBUMIN: 3.7 g/dL (ref 3.5–5.0)
ALK PHOS: 85 U/L (ref 40–150)
ALT: 16 U/L (ref 0–55)
ANION GAP: 8 meq/L (ref 3–11)
AST: 17 U/L (ref 5–34)
BUN: 11.1 mg/dL (ref 7.0–26.0)
CALCIUM: 9.8 mg/dL (ref 8.4–10.4)
CO2: 27 mEq/L (ref 22–29)
Chloride: 104 mEq/L (ref 98–109)
Creatinine: 0.9 mg/dL (ref 0.6–1.1)
EGFR: 75 mL/min/{1.73_m2} — ABNORMAL LOW (ref >90)
Glucose: 81 mg/dl (ref 70–140)
POTASSIUM: 4.2 meq/L (ref 3.5–5.1)
Sodium: 139 mEq/L (ref 136–145)
Total Bilirubin: 0.35 mg/dL (ref 0.20–1.20)
Total Protein: 6.8 g/dL (ref 6.4–8.3)

## 2015-11-23 MED ORDER — SODIUM CHLORIDE 0.9 % IV SOLN
1800.0000 mg/m2 | INTRAVENOUS | Status: DC
  Administered 2015-11-23: 3250 mg via INTRAVENOUS
  Filled 2015-11-23: qty 65

## 2015-11-23 MED ORDER — DIPHENHYDRAMINE HCL 50 MG/ML IJ SOLN
INTRAMUSCULAR | Status: AC
  Filled 2015-11-23: qty 1

## 2015-11-23 MED ORDER — SODIUM CHLORIDE 0.9% FLUSH
10.0000 mL | INTRAVENOUS | Status: DC | PRN
  Filled 2015-11-23: qty 10

## 2015-11-23 MED ORDER — SODIUM CHLORIDE 0.9 % IV SOLN
5.0000 mg/kg | Freq: Once | INTRAVENOUS | Status: AC
  Administered 2015-11-23: 325 mg via INTRAVENOUS
  Filled 2015-11-23: qty 13

## 2015-11-23 MED ORDER — SODIUM CHLORIDE 0.9 % IV SOLN
Freq: Once | INTRAVENOUS | Status: AC
  Administered 2015-11-23: 13:00:00 via INTRAVENOUS
  Filled 2015-11-23: qty 5

## 2015-11-23 MED ORDER — SODIUM CHLORIDE 0.9 % IV SOLN
Freq: Once | INTRAVENOUS | Status: AC
  Administered 2015-11-23: 13:00:00 via INTRAVENOUS

## 2015-11-23 MED ORDER — FAMOTIDINE IN NACL 20-0.9 MG/50ML-% IV SOLN
20.0000 mg | Freq: Two times a day (BID) | INTRAVENOUS | Status: DC
  Administered 2015-11-23: 20 mg via INTRAVENOUS

## 2015-11-23 MED ORDER — LEUCOVORIN CALCIUM INJECTION 350 MG
300.0000 mg/m2 | Freq: Once | INTRAVENOUS | Status: AC
  Administered 2015-11-23: 544 mg via INTRAVENOUS
  Filled 2015-11-23: qty 27.2

## 2015-11-23 MED ORDER — ATROPINE SULFATE 1 MG/ML IJ SOLN
0.5000 mg | Freq: Once | INTRAMUSCULAR | Status: AC | PRN
  Administered 2015-11-23: 0.5 mg via INTRAVENOUS

## 2015-11-23 MED ORDER — PALONOSETRON HCL INJECTION 0.25 MG/5ML
INTRAVENOUS | Status: AC
  Filled 2015-11-23: qty 5

## 2015-11-23 MED ORDER — SODIUM CHLORIDE 0.9% FLUSH
10.0000 mL | INTRAVENOUS | Status: AC | PRN
  Administered 2015-11-23: 10 mL via INTRAVENOUS
  Filled 2015-11-23: qty 10

## 2015-11-23 MED ORDER — DIPHENHYDRAMINE HCL 50 MG/ML IJ SOLN
25.0000 mg | Freq: Once | INTRAMUSCULAR | Status: AC
  Administered 2015-11-23: 25 mg via INTRAVENOUS

## 2015-11-23 MED ORDER — HEPARIN SOD (PORK) LOCK FLUSH 100 UNIT/ML IV SOLN
500.0000 [IU] | Freq: Once | INTRAVENOUS | Status: DC | PRN
  Filled 2015-11-23: qty 5

## 2015-11-23 MED ORDER — PALONOSETRON HCL INJECTION 0.25 MG/5ML
0.2500 mg | Freq: Once | INTRAVENOUS | Status: AC
  Administered 2015-11-23: 0.25 mg via INTRAVENOUS

## 2015-11-23 MED ORDER — FLUOROURACIL CHEMO INJECTION 2.5 GM/50ML
300.0000 mg/m2 | Freq: Once | INTRAVENOUS | Status: AC
  Administered 2015-11-23: 550 mg via INTRAVENOUS
  Filled 2015-11-23: qty 11

## 2015-11-23 MED ORDER — FAMOTIDINE IN NACL 20-0.9 MG/50ML-% IV SOLN
INTRAVENOUS | Status: AC
  Filled 2015-11-23: qty 50

## 2015-11-23 MED ORDER — ATROPINE SULFATE 1 MG/ML IJ SOLN
INTRAMUSCULAR | Status: AC
  Filled 2015-11-23: qty 1

## 2015-11-23 MED ORDER — IRINOTECAN HCL CHEMO INJECTION 100 MG/5ML
177.0000 mg/m2 | Freq: Once | INTRAVENOUS | Status: AC
  Administered 2015-11-23: 320 mg via INTRAVENOUS
  Filled 2015-11-23: qty 5.33

## 2015-11-23 NOTE — Progress Notes (Signed)
Martha Becker OFFICE PROGRESS NOTE   Diagnosis:  Colon cancer  INTERVAL HISTORY:   Martha Becker returns as scheduled. She completed cycle 3 FOLFIRI/Avastin 11/09/2015. She had mild nausea during the infusion. Otherwise no nausea or vomiting. She developed a few mouth sores. She is utilizing a mouth rinse. No significant diarrhea. The cough has "subsided". No shortness of breath. Since her last visit she had a single episode of dizziness. She thinks the dizziness was related to poor fluid intake. She has subsequently increased fluid intake and has had no further episodes.  Objective:  Vital signs in last 24 hours:  Blood pressure 136/78, pulse 85, temperature 98.3 F (36.8 C), temperature source Oral, resp. rate 18, height 5' 6"  (1.676 m), weight 154 lb 6.4 oz (70.035 kg), SpO2 99 %.    HEENT: Small healing ulceration left posterior buccal mucosa. No thrush. Resp: Lungs clear bilaterally. Cardio: Regular rate and rhythm. GI: Abdomen soft and nontender. No hepatomegaly. Vascular: No leg edema. Calves soft and nontender. Skin: No rash. Port-A-Cath without erythema.    Lab Results:  Lab Results  Component Value Date   WBC 3.6* 11/23/2015   HGB 11.7 11/23/2015   HCT 35.1 11/23/2015   MCV 90.3 11/23/2015   PLT 182 11/23/2015   NEUTROABS 1.7 11/23/2015    Imaging:  No results found.  Medications: I have reviewed the patient's current medications.  Assessment/Plan: 1. 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, status post a right colectomy 08/31/2014  no loss of mismatch repair protein expression, microsatellite stable  K-ras wild-type by standard testing,NRAS Q61K mutation identified on Foundation 1 testing  APC alteration detected  Staging CT scans December 2015 and PET scan 09/22/2014 consistent with metastatic bilateral lung nodules, metastatic retroperitoneal lymphadenopathy, and a probable left liver  metastasis  Cycle 1 CAPOX 10/29/2014 (Xeloda discontinued day 9 secondary to diarrhea)   CT 11/14/2014 revealed progressive metastatic disease at the lung bases and liver compared to a PET scan from 09/22/2014, mild improvement in retroperitoneal lymphadenopathy  Cycle 1 FOLFOX 12/02/2014  Cycle 2 FOLFOX, Avastin added 12/16/2014  Cycle 3 FOLFOX/Avastin 12/30/2014  Cycle 4 FOLFOX/Avastin 01/13/2015  Cycle 5 FOLFOX/Avastin 01/27/2015  CT 02/10/2015 with improvement in hepatic metastases, mild improvement of lung metastases and retroperitoneal lymphadenopathy  Cycle 6 FOLFOX/Avastin 02/16/2015  Cycle 7 FOLFOX/Avastin 03/02/2015  Cycle 8 FOLFOX/Avastin 03/16/2015  Cycle 9 FOLFOX/Avastin 04/13/2015  Restaging CTs 04/20/2015 with a decrease in the size of pulmonary nodules, liver lesions, and retroperitoneal lymph nodes  Restaging CTs 07/27/2015 revealed enlargement of bilateral lung nodules, stable liver lesions, decreased periaortic adenopathy  CTs 10/05/2015 revealed further enlargement of bilateral lung nodules ,enlargement of liver lesions, and enlargement of peritoneal lymph nodes  Cycle 1 FOLFIRI/Avastin 10/12/2015  Cycle 2 FOLFIRI/Avastin 10/26/2015  Cycle 3 FOLFIRI/Avastin 11/09/2015  Cycle 4 FOLFIRI/Avastin 11/23/2015  2. Elevated TSH, PET scan 09/22/2014 with diffuse increased metabolic uptake in the thyroid   3. Family history of multiple cancers including colon cancer in her paternal grandfather and rectal cancer in a paternal first cousin  91. Admission 11/14/2014 with nausea and diarrhea, most likely secondary to capecitabine induced enteritis-resolved. CT 11/14/2014 consistent with ileitis  5.Superficial phlebitis left hand/wrist 12/30/2014.  6. Early oxaliplatin neuropathy  7. Pain at the right gluteus and right leg-MRI of the lumbar spine 07/13/2015 confirmed a disc protrusion at L5-S1  8. Diaphoresis, rhinorrhea, shortness of  breath and cough following cycle 1 FOLFIRI/Avastin, did not occur following cycle 2  9. Oral mucositis secondary to chemotherapy  10. Nonproductive cough-most likely secondary to colon cancer; improved 11/23/2015  Disposition: [name] appears stable. She has completed 3 cycles of FOLFIRI/Avastin. Plan to proceed with cycle 4 today as scheduled. She will return for a follow-up visit and cycle 5 in 2 weeks.   The plan is for a restaging CT evaluation after cycle 5.    Ned Card ANP/GNP-BC   11/23/2015  12:35 PM

## 2015-11-23 NOTE — Patient Instructions (Signed)
[facility] Discharge Instructions for Patients Receiving Chemotherapy  Today you received the following chemotherapy agents Avastin/Irinotecan/Leucovorin/Fluorouracil.  To help prevent nausea and vomiting after your treatment, we encourage you to take your nausea medication as directed.   If you develop nausea and vomiting that is not controlled by your nausea medication, call the clinic.   BELOW ARE SYMPTOMS THAT SHOULD BE REPORTED IMMEDIATELY:  *FEVER GREATER THAN 100.5 F  *CHILLS WITH OR WITHOUT FEVER  NAUSEA AND VOMITING THAT IS NOT CONTROLLED WITH YOUR NAUSEA MEDICATION  *UNUSUAL SHORTNESS OF BREATH  *UNUSUAL BRUISING OR BLEEDING  TENDERNESS IN MOUTH AND THROAT WITH OR WITHOUT PRESENCE OF ULCERS  *URINARY PROBLEMS  *BOWEL PROBLEMS  UNUSUAL RASH Items with * indicate a potential emergency and should be followed up as soon as possible.  Feel free to call the clinic you have any questions or concerns. The clinic phone number is [phone].  Please show the CHEMO ALERT CARD at check-in to the Emergency Department and triage nurse.    

## 2015-11-24 LAB — CEA: CEA: 9.4 ng/mL — ABNORMAL HIGH (ref 0.0–4.7)

## 2015-11-25 ENCOUNTER — Ambulatory Visit (HOSPITAL_BASED_OUTPATIENT_CLINIC_OR_DEPARTMENT_OTHER): Payer: 59

## 2015-11-25 ENCOUNTER — Encounter: Payer: Self-pay | Admitting: *Deleted

## 2015-11-25 VITALS — BP 128/77 | HR 82 | Temp 97.8°F | Resp 18

## 2015-11-25 DIAGNOSIS — C7801 Secondary malignant neoplasm of right lung: Secondary | ICD-10-CM | POA: Diagnosis not present

## 2015-11-25 DIAGNOSIS — C7802 Secondary malignant neoplasm of left lung: Secondary | ICD-10-CM | POA: Diagnosis not present

## 2015-11-25 DIAGNOSIS — C182 Malignant neoplasm of ascending colon: Secondary | ICD-10-CM

## 2015-11-25 DIAGNOSIS — C787 Secondary malignant neoplasm of liver and intrahepatic bile duct: Secondary | ICD-10-CM

## 2015-11-25 DIAGNOSIS — C180 Malignant neoplasm of cecum: Secondary | ICD-10-CM

## 2015-11-25 MED ORDER — SODIUM CHLORIDE 0.9% FLUSH
10.0000 mL | INTRAVENOUS | Status: DC | PRN
  Administered 2015-11-25: 10 mL
  Filled 2015-11-25: qty 10

## 2015-11-25 MED ORDER — HEPARIN SOD (PORK) LOCK FLUSH 100 UNIT/ML IV SOLN
500.0000 [IU] | Freq: Once | INTRAVENOUS | Status: AC | PRN
  Administered 2015-11-25: 500 [IU]
  Filled 2015-11-25: qty 5

## 2015-11-25 NOTE — Progress Notes (Signed)
[place] Work  Clinical Social Work phoned pt to check in at home. Pt shared many stressors with caregiver concerns related to her parents. She shared ongoing financial concerns as well. CSW provided supportive listening and support. CSW reviewed additional resources to assist. Pt reminded that she could apply for the grant funds now that she has a small income through ss disability. CSW provided her with financial counselor info and she plans to reach out to them. Pt reports she is managing overall and is "keeping the hope alive". Pt agrees to reach if she needs something.   Clinical Social Work interventions: Supportive listening Resource education  Loren Racer, [place] Worker [place]  [place] Phone: [phone] Fax: [phone]

## 2015-11-25 NOTE — Patient Instructions (Signed)
[place] Discharge Instructions for Patients Receiving Chemotherapy  Today you received the following chemotherapy agent  To help prevent nausea and vomiting after your treatment, we encourage you to take your nausea medication   If you develop nausea and vomiting that is not controlled by your nausea medication, call the clinic.   BELOW ARE SYMPTOMS THAT SHOULD BE REPORTED IMMEDIATELY:  *FEVER GREATER THAN 100.5 F  *CHILLS WITH OR WITHOUT FEVER  NAUSEA AND VOMITING THAT IS NOT CONTROLLED WITH YOUR NAUSEA MEDICATION  *UNUSUAL SHORTNESS OF BREATH  *UNUSUAL BRUISING OR BLEEDING  TENDERNESS IN MOUTH AND THROAT WITH OR WITHOUT PRESENCE OF ULCERS  *URINARY PROBLEMS  *BOWEL PROBLEMS  UNUSUAL RASH Items with * indicate a potential emergency and should be followed up as soon as possible.  Feel free to call the clinic you have any questions or concerns. The clinic phone number is [phone].  Please show the [place] at check-in to the Emergency Department and triage nurse.

## 2015-11-29 ENCOUNTER — Encounter: Payer: Self-pay | Admitting: Oncology

## 2015-11-29 NOTE — Progress Notes (Signed)
Patient called in inquiring about transportation assistance. She was told by SW to contact me. Patient will bring proof of income on her next appointment of 12/08/2015 to [facility] as I will be out of the office for [place]. I advised Lenise and put an appointment note in. Made patient aware to give information to [facility] and she will assist.

## 2015-12-05 ENCOUNTER — Other Ambulatory Visit: Payer: Self-pay | Admitting: Oncology

## 2015-12-08 ENCOUNTER — Ambulatory Visit: Payer: 59

## 2015-12-08 ENCOUNTER — Ambulatory Visit (HOSPITAL_BASED_OUTPATIENT_CLINIC_OR_DEPARTMENT_OTHER): Payer: 59 | Admitting: Oncology

## 2015-12-08 ENCOUNTER — Telehealth: Payer: Self-pay | Admitting: Oncology

## 2015-12-08 ENCOUNTER — Telehealth: Payer: Self-pay | Admitting: *Deleted

## 2015-12-08 ENCOUNTER — Other Ambulatory Visit (HOSPITAL_BASED_OUTPATIENT_CLINIC_OR_DEPARTMENT_OTHER): Payer: 59

## 2015-12-08 VITALS — BP 137/84 | HR 70 | Temp 98.0°F | Resp 20 | Ht 66.0 in | Wt 155.9 lb

## 2015-12-08 DIAGNOSIS — C787 Secondary malignant neoplasm of liver and intrahepatic bile duct: Secondary | ICD-10-CM

## 2015-12-08 DIAGNOSIS — Z85048 Personal history of other malignant neoplasm of rectum, rectosigmoid junction, and anus: Secondary | ICD-10-CM

## 2015-12-08 DIAGNOSIS — C182 Malignant neoplasm of ascending colon: Secondary | ICD-10-CM

## 2015-12-08 DIAGNOSIS — C180 Malignant neoplasm of cecum: Secondary | ICD-10-CM

## 2015-12-08 DIAGNOSIS — C7802 Secondary malignant neoplasm of left lung: Secondary | ICD-10-CM | POA: Diagnosis not present

## 2015-12-08 DIAGNOSIS — K1231 Oral mucositis (ulcerative) due to antineoplastic therapy: Secondary | ICD-10-CM

## 2015-12-08 DIAGNOSIS — C7801 Secondary malignant neoplasm of right lung: Secondary | ICD-10-CM

## 2015-12-08 DIAGNOSIS — C7800 Secondary malignant neoplasm of unspecified lung: Secondary | ICD-10-CM

## 2015-12-08 DIAGNOSIS — Z95828 Presence of other vascular implants and grafts: Secondary | ICD-10-CM

## 2015-12-08 DIAGNOSIS — C786 Secondary malignant neoplasm of retroperitoneum and peritoneum: Secondary | ICD-10-CM

## 2015-12-08 DIAGNOSIS — G620 Drug-induced polyneuropathy: Secondary | ICD-10-CM

## 2015-12-08 DIAGNOSIS — Z85038 Personal history of other malignant neoplasm of large intestine: Secondary | ICD-10-CM

## 2015-12-08 DIAGNOSIS — D701 Agranulocytosis secondary to cancer chemotherapy: Secondary | ICD-10-CM

## 2015-12-08 DIAGNOSIS — C801 Malignant (primary) neoplasm, unspecified: Principal | ICD-10-CM

## 2015-12-08 LAB — COMPREHENSIVE METABOLIC PANEL
ALBUMIN: 3.7 g/dL (ref 3.5–5.0)
ALK PHOS: 86 U/L (ref 40–150)
ALT: 31 U/L (ref 0–55)
ANION GAP: 8 meq/L (ref 3–11)
AST: 36 U/L — AB (ref 5–34)
BILIRUBIN TOTAL: 0.36 mg/dL (ref 0.20–1.20)
BUN: 12.7 mg/dL (ref 7.0–26.0)
CALCIUM: 9.6 mg/dL (ref 8.4–10.4)
CO2: 24 mEq/L (ref 22–29)
CREATININE: 0.8 mg/dL (ref 0.6–1.1)
Chloride: 107 mEq/L (ref 98–109)
EGFR: 79 mL/min/{1.73_m2} — ABNORMAL LOW (ref >90)
Glucose: 81 mg/dl (ref 70–140)
Potassium: 4.5 mEq/L (ref 3.5–5.1)
Sodium: 139 mEq/L (ref 136–145)
Total Protein: 6.9 g/dL (ref 6.4–8.3)

## 2015-12-08 LAB — CBC WITH DIFFERENTIAL/PLATELET
BASO%: 2.4 % — AB (ref 0.0–2.0)
BASOS ABS: 0.1 10*3/uL (ref 0.0–0.1)
EOS%: 1.9 % (ref 0.0–7.0)
Eosinophils Absolute: 0.1 10*3/uL (ref 0.0–0.5)
HEMATOCRIT: 35.3 % (ref 34.8–46.6)
HEMOGLOBIN: 11.7 g/dL (ref 11.6–15.9)
LYMPH#: 1.4 10*3/uL (ref 0.9–3.3)
LYMPH%: 45.8 % (ref 14.0–49.7)
MCH: 30.3 pg (ref 25.1–34.0)
MCHC: 33.2 g/dL (ref 31.5–36.0)
MCV: 91.3 fL (ref 79.5–101.0)
MONO#: 0.5 10*3/uL (ref 0.1–0.9)
MONO%: 15 % — ABNORMAL HIGH (ref 0.0–14.0)
NEUT#: 1.1 10*3/uL — ABNORMAL LOW (ref 1.5–6.5)
NEUT%: 34.9 % — AB (ref 38.4–76.8)
PLATELETS: 178 10*3/uL (ref 145–400)
RBC: 3.86 10*6/uL (ref 3.70–5.45)
RDW: 17.3 % — AB (ref 11.2–14.5)
WBC: 3.1 10*3/uL — ABNORMAL LOW (ref 3.9–10.3)

## 2015-12-08 MED ORDER — HEPARIN SOD (PORK) LOCK FLUSH 100 UNIT/ML IV SOLN
500.0000 [IU] | Freq: Once | INTRAVENOUS | Status: AC
  Administered 2015-12-08: 500 [IU] via INTRAVENOUS
  Filled 2015-12-08: qty 5

## 2015-12-08 MED ORDER — SODIUM CHLORIDE 0.9% FLUSH
10.0000 mL | INTRAVENOUS | Status: DC | PRN
  Administered 2015-12-08: 10 mL via INTRAVENOUS
  Filled 2015-12-08: qty 10

## 2015-12-08 NOTE — Progress Notes (Signed)
[place] OFFICE PROGRESS NOTE   Diagnosis: Colon cancer  INTERVAL HISTORY:   Ms. Fregeau returns as scheduled. She completed another cycle of FOLFIRI/Avastin 11/23/2015. She had an ulcer at the lower left buccal mucosa near the inner lip. The cough has improved. No nausea/vomiting or diarrhea. She reports constipation following chemotherapy. The neuropathy symptoms now feel different. She reports a "stinging "discomfort at the fingers. She has noted mild hair loss.  Objective:  Vital signs in last 24 hours:  Blood pressure 137/84, pulse 70, temperature 98 F (36.7 C), temperature source Oral, resp. rate 20, height 5' 6"  (1.676 m), weight 155 lb 14.4 oz (70.716 kg), SpO2 99 %.    HEENT: No thrush, healed ulcer at the lateral lower left inner lip Resp: Lungs clear bilaterally Cardio: Regular rate and rhythm GI: No hepatomegaly Vascular: No leg edema  Skin: Hyperpigmentation of the hands   Portacath/PICC-without erythema  Lab Results:  Lab Results  Component Value Date   WBC 3.1* 12/08/2015   HGB 11.7 12/08/2015   HCT 35.3 12/08/2015   MCV 91.3 12/08/2015   PLT 178 12/08/2015   NEUTROABS 1.1* 12/08/2015      Lab Results  Component Value Date   CEA1 9.4* 11/23/2015    Medications: I have reviewed the patient's current medications.  Assessment/Plan: 1. 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, status post a right colectomy 08/31/2014  no loss of mismatch repair protein expression, microsatellite stable  K-ras wild-type by standard testing,NRAS Q61K mutation identified on Foundation 1 testing  APC alteration detected  Staging CT scans December 2015 and PET scan 09/22/2014 consistent with metastatic bilateral lung nodules, metastatic retroperitoneal lymphadenopathy, and a probable left liver metastasis  Cycle 1 CAPOX 10/29/2014 (Xeloda discontinued day 9 secondary to diarrhea)   CT  11/14/2014 revealed progressive metastatic disease at the lung bases and liver compared to a PET scan from 09/22/2014, mild improvement in retroperitoneal lymphadenopathy  Cycle 1 FOLFOX 12/02/2014  Cycle 2 FOLFOX, Avastin added 12/16/2014  Cycle 3 FOLFOX/Avastin 12/30/2014  Cycle 4 FOLFOX/Avastin 01/13/2015  Cycle 5 FOLFOX/Avastin 01/27/2015  CT 02/10/2015 with improvement in hepatic metastases, mild improvement of lung metastases and retroperitoneal lymphadenopathy  Cycle 6 FOLFOX/Avastin 02/16/2015  Cycle 7 FOLFOX/Avastin 03/02/2015  Cycle 8 FOLFOX/Avastin 03/16/2015  Cycle 9 FOLFOX/Avastin 04/13/2015  Restaging CTs 04/20/2015 with a decrease in the size of pulmonary nodules, liver lesions, and retroperitoneal lymph nodes  Restaging CTs 07/27/2015 revealed enlargement of bilateral lung nodules, stable liver lesions, decreased periaortic adenopathy  CTs 10/05/2015 revealed further enlargement of bilateral lung nodules ,enlargement of liver lesions, and enlargement of peritoneal lymph nodes  Cycle 1 FOLFIRI/Avastin 10/12/2015  Cycle 2 FOLFIRI/Avastin 10/26/2015  Cycle 3 FOLFIRI/Avastin 11/09/2015  Cycle 4 FOLFIRI/Avastin 11/23/2015  Cycle 5 FOLFIRI/Avastin 12/14/2015  2. Elevated TSH, PET scan 09/22/2014 with diffuse increased metabolic uptake in the thyroid   3. Family history of multiple cancers including colon cancer in her paternal grandfather and rectal cancer in a paternal first cousin  37. Admission 11/14/2014 with nausea and diarrhea, most likely secondary to capecitabine induced enteritis-resolved. CT 11/14/2014 consistent with ileitis  5.Superficial phlebitis left hand/wrist 12/30/2014.  6. Early oxaliplatin neuropathy  7. Pain at the right gluteus and right leg-MRI of the lumbar spine 07/13/2015 confirmed a disc protrusion at L5-S1  8. Diaphoresis, rhinorrhea, shortness of breath and cough following cycle 1 FOLFIRI/Avastin, did not  occur following cycle 2  9. Oral mucositis secondary to chemotherapy  10. Nonproductive cough-most  likely secondary to colon cancer; improved 11/23/2015  11.  Neutropenia secondary to chemotherapy    Disposition:  Ms. Hinde has completed 4 cycles of FOLFIRI/Avastin. The most recent cycle of chemotherapy was complicated by a single mouth ulcer. This has healed. She has moderate neutropenia today. We discussed treatment options including the addition of Neulasta, proceeding with 5-FU/Avastin, and delaying chemotherapy until next week. She prefers a treatment delay until 12/14/2015.  She will return for cycle 5 FOLFIRI/Avastin 12/14/2015. She will undergo a restaging CT evaluation after cycle 5.  The scans will be scheduled prior to an office visit 12/28/2015. We will add Neulasta with cycle 5 chemotherapy pending the neutrophil count next week.  Betsy Coder, MD  12/08/2015  11:15 AM

## 2015-12-08 NOTE — Telephone Encounter (Signed)
Pt called stating that after her last treatment she experienced intermittent nose bleeds in the morning for short periods of time.  Dr. Benay Spice notified of pt.'s complaint.  Return call placed to pt, instructing her per Dr. Benay Spice that nose bleeds are most likely d/t Avastin.  Pt instructed that if she is unable to stop nose bleeds after approx 5 minutes or she notices an increase in frequency in nose bleeds to contact the office immediately.

## 2015-12-08 NOTE — Telephone Encounter (Signed)
per pof to sch pt appt-sent Mw emailt o sch trmt-pt to get updated copy b4 leaving

## 2015-12-08 NOTE — Telephone Encounter (Signed)
Gave and printed appt shced and avs for pt for April °

## 2015-12-08 NOTE — Patient Instructions (Signed)

## 2015-12-09 ENCOUNTER — Encounter: Payer: Self-pay | Admitting: Oncology

## 2015-12-09 NOTE — Progress Notes (Signed)
Pt is approved for the $400 [facility] grant.  °

## 2015-12-12 ENCOUNTER — Other Ambulatory Visit: Payer: Self-pay | Admitting: Oncology

## 2015-12-14 ENCOUNTER — Other Ambulatory Visit (HOSPITAL_BASED_OUTPATIENT_CLINIC_OR_DEPARTMENT_OTHER): Payer: 59

## 2015-12-14 ENCOUNTER — Ambulatory Visit: Payer: 59

## 2015-12-14 ENCOUNTER — Encounter: Payer: Self-pay | Admitting: Oncology

## 2015-12-14 ENCOUNTER — Ambulatory Visit (HOSPITAL_BASED_OUTPATIENT_CLINIC_OR_DEPARTMENT_OTHER): Payer: 59

## 2015-12-14 VITALS — BP 145/92 | HR 75 | Temp 97.5°F | Resp 18

## 2015-12-14 DIAGNOSIS — C180 Malignant neoplasm of cecum: Secondary | ICD-10-CM

## 2015-12-14 DIAGNOSIS — C7801 Secondary malignant neoplasm of right lung: Secondary | ICD-10-CM

## 2015-12-14 DIAGNOSIS — C182 Malignant neoplasm of ascending colon: Secondary | ICD-10-CM

## 2015-12-14 DIAGNOSIS — C787 Secondary malignant neoplasm of liver and intrahepatic bile duct: Secondary | ICD-10-CM

## 2015-12-14 DIAGNOSIS — Z95828 Presence of other vascular implants and grafts: Secondary | ICD-10-CM

## 2015-12-14 DIAGNOSIS — C7802 Secondary malignant neoplasm of left lung: Secondary | ICD-10-CM

## 2015-12-14 LAB — CBC WITH DIFFERENTIAL/PLATELET
BASO%: 0.5 % (ref 0.0–2.0)
Basophils Absolute: 0 10*3/uL (ref 0.0–0.1)
EOS ABS: 0.2 10*3/uL (ref 0.0–0.5)
EOS%: 4 % (ref 0.0–7.0)
HCT: 35.3 % (ref 34.8–46.6)
HGB: 12.1 g/dL (ref 11.6–15.9)
LYMPH%: 45.6 % (ref 14.0–49.7)
MCH: 30.8 pg (ref 25.1–34.0)
MCHC: 34.3 g/dL (ref 31.5–36.0)
MCV: 89.8 fL (ref 79.5–101.0)
MONO#: 0.7 10*3/uL (ref 0.1–0.9)
MONO%: 17.8 % — AB (ref 0.0–14.0)
NEUT%: 32.1 % — ABNORMAL LOW (ref 38.4–76.8)
NEUTROS ABS: 1.2 10*3/uL — AB (ref 1.5–6.5)
Platelets: 206 10*3/uL (ref 145–400)
RBC: 3.93 10*6/uL (ref 3.70–5.45)
RDW: 17 % — ABNORMAL HIGH (ref 11.2–14.5)
WBC: 3.7 10*3/uL — AB (ref 3.9–10.3)
lymph#: 1.7 10*3/uL (ref 0.9–3.3)

## 2015-12-14 MED ORDER — SODIUM CHLORIDE 0.9% FLUSH
10.0000 mL | INTRAVENOUS | Status: DC | PRN
  Administered 2015-12-14: 10 mL via INTRAVENOUS
  Filled 2015-12-14: qty 10

## 2015-12-14 MED ORDER — HEPARIN SOD (PORK) LOCK FLUSH 100 UNIT/ML IV SOLN
500.0000 [IU] | Freq: Once | INTRAVENOUS | Status: AC
  Administered 2015-12-14: 500 [IU] via INTRAVENOUS
  Filled 2015-12-14: qty 5

## 2015-12-14 NOTE — Progress Notes (Signed)
Pt refuses treatment today. Dr Benay Spice ordered return next week for cbc/diff and tx appt.Ntoe sent to Desk nurse . Pt sent to scheduling.

## 2015-12-14 NOTE — Progress Notes (Signed)
Fax sent 11/12/15 I sent to medical records

## 2015-12-14 NOTE — Patient Instructions (Signed)

## 2015-12-14 NOTE — Patient Instructions (Addendum)
Neutropenia Neutropenia is a condition that occurs when the level of a certain type of white blood cell (neutrophil) in your body becomes lower than normal. Neutrophils are made in the bone marrow and fight infections. These cells protect against bacteria and viruses. The fewer neutrophils you have, and the longer your body remains without them, the greater your risk of getting a severe infection becomes. CAUSES  The cause of neutropenia may be hard to determine. However, it is usually due to 3 main problems:   Decreased production of neutrophils. This may be due to:  Certain medicines such as chemotherapy.  Genetic problems.  Cancer.  Radiation treatments.  Vitamin deficiency.  Some pesticides.  Increased destruction of neutrophils. This may be due to:  Overwhelming infections.  Hemolytic anemia. This is when the body destroys its own blood cells.  Chemotherapy.  Neutrophils moving to areas of the body where they cannot fight infections. This may be due to:  Dialysis procedures.  Conditions where the spleen becomes enlarged. Neutrophils are held in the spleen and are not available to the rest of the body.  Overwhelming infections. The neutrophils are held in the area of the infection and are not available to the rest of the body. SYMPTOMS  There are no specific symptoms of neutropenia. The lack of neutrophils can result in an infection, and an infection can cause various problems. DIAGNOSIS  Diagnosis is made by a blood test. A complete blood count is performed. The normal level of neutrophils in human blood differs with age and race. Infants have lower counts than older children and adults. African Americans have lower counts than Caucasians or Asians. The average adult level is 1500 cells/mm3 of blood. Neutrophil counts are interpreted as follows:  Greater than 1000 cells/mm3 gives normal protection against infection.  500 to 1000 cells/mm3 gives an increased risk for  infection.  200 to 500 cells/mm3 is a greater risk for severe infection.  Lower than 200 cells/mm3 is a marked risk of infection. This may require hospitalization and treatment with antibiotic medicines. TREATMENT  Treatment depends on the underlying cause, severity, and presence of infections or symptoms. It also depends on your health. Your caregiver will discuss the treatment plan with you. Mild cases are often easily treated and have a good outcome. Preventative measures may also be started to limit your risk of infections. Treatment can include:  Taking antibiotics.  Stopping medicines that are known to cause neutropenia.  Correcting nutritional deficiencies by eating green vegetables to supply folic acid and taking vitamin B supplements.  Stopping exposure to pesticides if your neutropenia is related to pesticide exposure.  Taking a blood growth factor called sargramostim, pegfilgrastim, or filgrastim if you are undergoing chemotherapy for cancer. This stimulates white blood cell production.  Removal of the spleen if you have Felty's syndrome and have repeated infections. HOME CARE INSTRUCTIONS   Follow your caregiver's instructions about when you need to have blood work done.  Wash your hands often. Make sure others who come in contact with you also wash their hands.  Wash raw fruits and vegetables before eating them. They can carry bacteria and fungi.  Avoid people with colds or spreadable (contagious) diseases (chickenpox, herpes zoster, influenza).  Avoid large crowds.  Avoid construction areas. The dust can release fungus into the air.  Be cautious around children in daycare or school environments.  Take care of your respiratory system by coughing and deep breathing.  Bathe daily.  Protect your skin from cuts and   burns.  Do not work in the garden or with flowers and plants.  Care for the mouth before and after meals by brushing with a soft toothbrush. If you have  mucositis, do not use mouthwash. Mouthwash contains alcohol and can dry out the mouth even more.  Clean the area between the genitals and the anus (perineal area) after urination and bowel movements. Women need to wipe from front to back.  Use a water soluble lubricant during sexual intercourse and practice good hygiene after. Do not have intercourse if you are severely neutropenic. Check with your caregiver for guidelines.  Exercise daily as tolerated.  Avoid people who were vaccinated with a live vaccine in the past 30 days. You should not receive live vaccines (polio, typhoid).  Do not provide direct care for pets. Avoid animal droppings. Do not clean litter boxes and bird cages.  Do not share food utensils.  Do not use tampons, enemas, or rectal suppositories unless directed by your caregiver.  Use an electric razor to remove hair.  Wash your hands after handling magazines, letters, and newspapers. SEEK IMMEDIATE MEDICAL CARE IF:   You have a fever.  You have chills or start to shake.  You feel nauseous or vomit.  You develop mouth sores.  You develop aches and pains.  You have redness and swelling around open wounds.  Your skin is warm to the touch.  You have pus coming from your wounds.  You develop swollen lymph nodes.  You feel weak or fatigued.  You develop red streaks on the skin. MAKE SURE YOU:  Understand these instructions.  Will watch your condition.  Will get help right away if you are not doing well or get worse.   This information is not intended to replace advice given to you by your health care provider. Make sure you discuss any questions you have with your health care provider.   Document Released: 02/17/2002 Document Revised: 11/20/2011 Document Reviewed: 03/10/2015 Elsevier Interactive Patient Education 2016 Elsevier Inc.  

## 2015-12-14 NOTE — Progress Notes (Signed)
Met with patient at registration to have her sign [place] form. Patient approved for $400. Patient has copy of approval as well as expenses it covers. Patient is aware there are currently no funds available for her diagnosis for copay assistance. Patient's main concern is transportation. Advised patient to let myself or Lenise know when she needs a gas card. Patient has my card for any additional questions or concerns.

## 2015-12-14 NOTE — Progress Notes (Signed)
Faxes sent 05/12/15 05/14/15 04/23/15 I sent to medical records

## 2015-12-15 ENCOUNTER — Telehealth: Payer: Self-pay | Admitting: Oncology

## 2015-12-15 NOTE — Telephone Encounter (Signed)
lvm to inform patient of l/fl/rx appt 4/13 per 4/3 pof

## 2015-12-16 ENCOUNTER — Ambulatory Visit: Payer: 59

## 2015-12-21 ENCOUNTER — Ambulatory Visit: Payer: 59

## 2015-12-21 ENCOUNTER — Ambulatory Visit: Payer: 59 | Admitting: Oncology

## 2015-12-21 ENCOUNTER — Telehealth: Payer: Self-pay | Admitting: Oncology

## 2015-12-21 ENCOUNTER — Other Ambulatory Visit: Payer: 59

## 2015-12-21 ENCOUNTER — Telehealth: Payer: Self-pay | Admitting: *Deleted

## 2015-12-21 NOTE — Telephone Encounter (Signed)
returned call and fwd pt to MW to r/s chemo

## 2015-12-21 NOTE — Telephone Encounter (Signed)
Patient called and left a message to move her appts back to tues/thurs. Patient stated "I canceled her scan on 4/26, not knowing my white count." told patient I will talk with desk RN and someone will call her back.

## 2015-12-22 ENCOUNTER — Other Ambulatory Visit: Payer: Self-pay | Admitting: *Deleted

## 2015-12-23 ENCOUNTER — Telehealth: Payer: Self-pay | Admitting: *Deleted

## 2015-12-23 ENCOUNTER — Ambulatory Visit: Payer: 59

## 2015-12-23 ENCOUNTER — Other Ambulatory Visit: Payer: Self-pay | Admitting: *Deleted

## 2015-12-23 ENCOUNTER — Telehealth: Payer: Self-pay | Admitting: Nurse Practitioner

## 2015-12-23 ENCOUNTER — Telehealth: Payer: Self-pay | Admitting: Oncology

## 2015-12-23 ENCOUNTER — Other Ambulatory Visit: Payer: 59

## 2015-12-23 NOTE — Telephone Encounter (Signed)
per pof to r/s pt appt-cld & r/s Ct per pof-cld & spoke w/pt and gave r/s appt time & date-pt wanted to speak with nurse-b4 I could offer to transfer pt disconneted call

## 2015-12-23 NOTE — Progress Notes (Signed)
Pt called today refusing to come in today and states that she will only come in on Tuesday's and Thursday's.  Dr. Benay Spice notified and order received to reschedule pt accordingly.

## 2015-12-23 NOTE — Telephone Encounter (Signed)
per pof to r/s pt appt-sent MW emailt os ch trmt-will call pt after reply

## 2015-12-23 NOTE — Telephone Encounter (Signed)
Pt.'s treatment day to be changed to 4/18 per pt. Request.  Pt to keep scheduled Ct scan on 01/03/16 per Dr. Benay Spice.

## 2015-12-23 NOTE — Telephone Encounter (Signed)
Patient not wanting to come for treatment today and asking to have it moved to next Tuesday (as normal schedule is Tuesdays. CT already scheduled for 4/24 but she says that she can reschedule it. Patient requesting call back today.

## 2015-12-23 NOTE — Telephone Encounter (Signed)
per pof tos ch pt appt-sent MW email to sch trmt-will call pt after reply °

## 2015-12-23 NOTE — Telephone Encounter (Signed)
Per staff message and POF I have scheduled appts. Advised scheduler of appts and to move labs. JMW  

## 2015-12-23 NOTE — Telephone Encounter (Signed)
Per staff message and POF I have scheduled appts. Advised scheduler of appts. JMW  

## 2015-12-24 ENCOUNTER — Telehealth: Payer: Self-pay | Admitting: Oncology

## 2015-12-24 NOTE — Telephone Encounter (Signed)
per pof to sch pt appt-cld & left pt a message of time & date of appt on 4/18@9 :45

## 2015-12-27 ENCOUNTER — Other Ambulatory Visit: Payer: Self-pay | Admitting: *Deleted

## 2015-12-27 DIAGNOSIS — C180 Malignant neoplasm of cecum: Secondary | ICD-10-CM

## 2015-12-28 ENCOUNTER — Ambulatory Visit: Payer: 59 | Admitting: Oncology

## 2015-12-28 ENCOUNTER — Other Ambulatory Visit (HOSPITAL_BASED_OUTPATIENT_CLINIC_OR_DEPARTMENT_OTHER): Payer: 59

## 2015-12-28 ENCOUNTER — Ambulatory Visit: Payer: 59

## 2015-12-28 ENCOUNTER — Other Ambulatory Visit: Payer: 59

## 2015-12-28 ENCOUNTER — Ambulatory Visit (HOSPITAL_BASED_OUTPATIENT_CLINIC_OR_DEPARTMENT_OTHER): Payer: 59

## 2015-12-28 VITALS — BP 130/72 | HR 72 | Resp 16

## 2015-12-28 VITALS — BP 115/75 | HR 70 | Temp 98.1°F | Resp 18

## 2015-12-28 DIAGNOSIS — Z5112 Encounter for antineoplastic immunotherapy: Secondary | ICD-10-CM | POA: Diagnosis not present

## 2015-12-28 DIAGNOSIS — C7802 Secondary malignant neoplasm of left lung: Secondary | ICD-10-CM | POA: Diagnosis not present

## 2015-12-28 DIAGNOSIS — C180 Malignant neoplasm of cecum: Secondary | ICD-10-CM

## 2015-12-28 DIAGNOSIS — C182 Malignant neoplasm of ascending colon: Secondary | ICD-10-CM

## 2015-12-28 DIAGNOSIS — C787 Secondary malignant neoplasm of liver and intrahepatic bile duct: Secondary | ICD-10-CM

## 2015-12-28 DIAGNOSIS — C7800 Secondary malignant neoplasm of unspecified lung: Secondary | ICD-10-CM

## 2015-12-28 DIAGNOSIS — C801 Malignant (primary) neoplasm, unspecified: Secondary | ICD-10-CM

## 2015-12-28 DIAGNOSIS — C7801 Secondary malignant neoplasm of right lung: Secondary | ICD-10-CM | POA: Diagnosis not present

## 2015-12-28 DIAGNOSIS — Z5111 Encounter for antineoplastic chemotherapy: Secondary | ICD-10-CM | POA: Diagnosis not present

## 2015-12-28 DIAGNOSIS — C786 Secondary malignant neoplasm of retroperitoneum and peritoneum: Secondary | ICD-10-CM

## 2015-12-28 LAB — CBC WITH DIFFERENTIAL/PLATELET
BASO%: 0.5 % (ref 0.0–2.0)
Basophils Absolute: 0 10*3/uL (ref 0.0–0.1)
EOS ABS: 0.3 10*3/uL (ref 0.0–0.5)
EOS%: 3.9 % (ref 0.0–7.0)
HEMATOCRIT: 37.1 % (ref 34.8–46.6)
HEMOGLOBIN: 12.6 g/dL (ref 11.6–15.9)
LYMPH%: 24.4 % (ref 14.0–49.7)
MCH: 31 pg (ref 25.1–34.0)
MCHC: 34 g/dL (ref 31.5–36.0)
MCV: 91.2 fL (ref 79.5–101.0)
MONO#: 0.7 10*3/uL (ref 0.1–0.9)
MONO%: 10.5 % (ref 0.0–14.0)
NEUT%: 60.7 % (ref 38.4–76.8)
NEUTROS ABS: 3.9 10*3/uL (ref 1.5–6.5)
PLATELETS: 180 10*3/uL (ref 145–400)
RBC: 4.07 10*6/uL (ref 3.70–5.45)
RDW: 16.6 % — ABNORMAL HIGH (ref 11.2–14.5)
WBC: 6.5 10*3/uL (ref 3.9–10.3)
lymph#: 1.6 10*3/uL (ref 0.9–3.3)

## 2015-12-28 LAB — COMPREHENSIVE METABOLIC PANEL
ALK PHOS: 67 U/L (ref 40–150)
ALT: 14 U/L (ref 0–55)
ANION GAP: 7 meq/L (ref 3–11)
AST: 22 U/L (ref 5–34)
Albumin: 3.8 g/dL (ref 3.5–5.0)
BILIRUBIN TOTAL: 0.41 mg/dL (ref 0.20–1.20)
BUN: 11.1 mg/dL (ref 7.0–26.0)
CALCIUM: 9.7 mg/dL (ref 8.4–10.4)
CO2: 24 meq/L (ref 22–29)
CREATININE: 0.8 mg/dL (ref 0.6–1.1)
Chloride: 106 mEq/L (ref 98–109)
EGFR: 81 mL/min/{1.73_m2} — AB (ref >90)
GLUCOSE: 94 mg/dL (ref 70–140)
POTASSIUM: 4 meq/L (ref 3.5–5.1)
Sodium: 137 mEq/L (ref 136–145)
Total Protein: 6.9 g/dL (ref 6.4–8.3)

## 2015-12-28 MED ORDER — FAMOTIDINE IN NACL 20-0.9 MG/50ML-% IV SOLN
INTRAVENOUS | Status: AC
  Filled 2015-12-28: qty 50

## 2015-12-28 MED ORDER — SODIUM CHLORIDE 0.9 % IV SOLN
1800.0000 mg/m2 | INTRAVENOUS | Status: DC
  Administered 2015-12-28: 3250 mg via INTRAVENOUS
  Filled 2015-12-28: qty 65

## 2015-12-28 MED ORDER — SODIUM CHLORIDE 0.9 % IV SOLN
Freq: Once | INTRAVENOUS | Status: AC
  Administered 2015-12-28: 11:00:00 via INTRAVENOUS
  Filled 2015-12-28: qty 5

## 2015-12-28 MED ORDER — SODIUM CHLORIDE 0.9 % IV SOLN
Freq: Once | INTRAVENOUS | Status: AC
  Administered 2015-12-28: 11:00:00 via INTRAVENOUS

## 2015-12-28 MED ORDER — SODIUM CHLORIDE 0.9 % IV SOLN
5.0000 mg/kg | Freq: Once | INTRAVENOUS | Status: AC
  Administered 2015-12-28: 325 mg via INTRAVENOUS
  Filled 2015-12-28: qty 13

## 2015-12-28 MED ORDER — DIPHENHYDRAMINE HCL 50 MG/ML IJ SOLN
25.0000 mg | Freq: Once | INTRAMUSCULAR | Status: AC
  Administered 2015-12-28: 25 mg via INTRAVENOUS

## 2015-12-28 MED ORDER — ATROPINE SULFATE 1 MG/ML IJ SOLN
INTRAMUSCULAR | Status: AC
  Filled 2015-12-28: qty 1

## 2015-12-28 MED ORDER — IRINOTECAN HCL CHEMO INJECTION 100 MG/5ML
177.0000 mg/m2 | Freq: Once | INTRAVENOUS | Status: AC
  Administered 2015-12-28: 320 mg via INTRAVENOUS
  Filled 2015-12-28: qty 12

## 2015-12-28 MED ORDER — ATROPINE SULFATE 1 MG/ML IJ SOLN
0.5000 mg | Freq: Once | INTRAMUSCULAR | Status: AC | PRN
  Administered 2015-12-28: 0.5 mg via INTRAVENOUS

## 2015-12-28 MED ORDER — LEUCOVORIN CALCIUM INJECTION 350 MG
300.0000 mg/m2 | Freq: Once | INTRAVENOUS | Status: AC
  Administered 2015-12-28: 544 mg via INTRAVENOUS
  Filled 2015-12-28: qty 27.2

## 2015-12-28 MED ORDER — FAMOTIDINE IN NACL 20-0.9 MG/50ML-% IV SOLN
20.0000 mg | Freq: Two times a day (BID) | INTRAVENOUS | Status: DC
  Administered 2015-12-28: 20 mg via INTRAVENOUS

## 2015-12-28 MED ORDER — DIPHENHYDRAMINE HCL 50 MG/ML IJ SOLN
INTRAMUSCULAR | Status: AC
  Filled 2015-12-28: qty 1

## 2015-12-28 MED ORDER — PALONOSETRON HCL INJECTION 0.25 MG/5ML
0.2500 mg | Freq: Once | INTRAVENOUS | Status: AC
  Administered 2015-12-28: 0.25 mg via INTRAVENOUS

## 2015-12-28 MED ORDER — PALONOSETRON HCL INJECTION 0.25 MG/5ML
INTRAVENOUS | Status: AC
  Filled 2015-12-28: qty 5

## 2015-12-28 MED ORDER — FLUOROURACIL CHEMO INJECTION 2.5 GM/50ML
300.0000 mg/m2 | Freq: Once | INTRAVENOUS | Status: AC
  Administered 2015-12-28: 550 mg via INTRAVENOUS
  Filled 2015-12-28: qty 11

## 2015-12-28 MED ORDER — SODIUM CHLORIDE 0.9 % IJ SOLN
10.0000 mL | Freq: Once | INTRAMUSCULAR | Status: AC
  Administered 2015-12-28: 10 mL
  Filled 2015-12-28: qty 10

## 2015-12-28 NOTE — Patient Instructions (Signed)
[facility] Discharge Instructions for Patients Receiving Chemotherapy  Today you received the following chemotherapy agents Avastin/Irinotecan/Leucovorin/Fluorouracil.  To help prevent nausea and vomiting after your treatment, we encourage you to take your nausea medication as directed.   If you develop nausea and vomiting that is not controlled by your nausea medication, call the clinic.   BELOW ARE SYMPTOMS THAT SHOULD BE REPORTED IMMEDIATELY:  *FEVER GREATER THAN 100.5 F  *CHILLS WITH OR WITHOUT FEVER  NAUSEA AND VOMITING THAT IS NOT CONTROLLED WITH YOUR NAUSEA MEDICATION  *UNUSUAL SHORTNESS OF BREATH  *UNUSUAL BRUISING OR BLEEDING  TENDERNESS IN MOUTH AND THROAT WITH OR WITHOUT PRESENCE OF ULCERS  *URINARY PROBLEMS  *BOWEL PROBLEMS  UNUSUAL RASH Items with * indicate a potential emergency and should be followed up as soon as possible.  Feel free to call the clinic you have any questions or concerns. The clinic phone number is [phone].  Please show the CHEMO ALERT CARD at check-in to the Emergency Department and triage nurse.    

## 2015-12-30 ENCOUNTER — Ambulatory Visit: Payer: 59

## 2015-12-30 ENCOUNTER — Ambulatory Visit (HOSPITAL_BASED_OUTPATIENT_CLINIC_OR_DEPARTMENT_OTHER): Payer: 59

## 2015-12-30 VITALS — BP 124/79 | HR 65 | Temp 98.1°F | Resp 18

## 2015-12-30 DIAGNOSIS — C787 Secondary malignant neoplasm of liver and intrahepatic bile duct: Secondary | ICD-10-CM

## 2015-12-30 DIAGNOSIS — C182 Malignant neoplasm of ascending colon: Secondary | ICD-10-CM

## 2015-12-30 DIAGNOSIS — C7802 Secondary malignant neoplasm of left lung: Secondary | ICD-10-CM

## 2015-12-30 DIAGNOSIS — Z452 Encounter for adjustment and management of vascular access device: Secondary | ICD-10-CM | POA: Diagnosis not present

## 2015-12-30 DIAGNOSIS — C7801 Secondary malignant neoplasm of right lung: Secondary | ICD-10-CM

## 2015-12-30 DIAGNOSIS — C180 Malignant neoplasm of cecum: Secondary | ICD-10-CM

## 2015-12-30 MED ORDER — PEGFILGRASTIM INJECTION 6 MG/0.6ML ~~LOC~~
6.0000 mg | PREFILLED_SYRINGE | Freq: Once | SUBCUTANEOUS | Status: DC
  Filled 2015-12-30: qty 0.6

## 2015-12-30 MED ORDER — HEPARIN SOD (PORK) LOCK FLUSH 100 UNIT/ML IV SOLN
500.0000 [IU] | Freq: Once | INTRAVENOUS | Status: AC | PRN
  Administered 2015-12-30: 500 [IU]
  Filled 2015-12-30: qty 5

## 2015-12-30 MED ORDER — SODIUM CHLORIDE 0.9% FLUSH
10.0000 mL | INTRAVENOUS | Status: DC | PRN
  Administered 2015-12-30: 10 mL
  Filled 2015-12-30: qty 10

## 2015-12-30 NOTE — Patient Instructions (Signed)
Fluorouracil, 5-FU injection What is this medicine? FLUOROURACIL, 5-FU (flure oh YOOR a sil) is a chemotherapy drug. It slows the growth of cancer cells. This medicine is used to treat many types of cancer like breast cancer, colon or rectal cancer, pancreatic cancer, and stomach cancer. This medicine may be used for other purposes; ask your health care provider or pharmacist if you have questions. What should I tell my health care provider before I take this medicine? They need to know if you have any of these conditions: -blood disorders -dihydropyrimidine dehydrogenase (DPD) deficiency -infection (especially a virus infection such as chickenpox, cold sores, or herpes) -kidney disease -liver disease -malnourished, poor nutrition -recent or ongoing radiation therapy -an unusual or allergic reaction to fluorouracil, other chemotherapy, other medicines, foods, dyes, or preservatives -pregnant or trying to get pregnant -breast-feeding How should I use this medicine? This drug is given as an infusion or injection into a vein. It is administered in a hospital or clinic by a specially trained health care professional. Talk to your pediatrician regarding the use of this medicine in children. Special care may be needed. Overdosage: If you think you have taken too much of this medicine contact a poison control center or emergency room at once. NOTE: This medicine is only for you. Do not share this medicine with others. What if I miss a dose? It is important not to miss your dose. Call your doctor or health care professional if you are unable to keep an appointment. What may interact with this medicine? -allopurinol -cimetidine -dapsone -digoxin -hydroxyurea -leucovorin -levamisole -medicines for seizures like ethotoin, fosphenytoin, phenytoin -medicines to increase blood counts like filgrastim, pegfilgrastim, sargramostim -medicines that treat or prevent blood clots like warfarin,  enoxaparin, and dalteparin -methotrexate -metronidazole -pyrimethamine -some other chemotherapy drugs like busulfan, cisplatin, estramustine, vinblastine -trimethoprim -trimetrexate -vaccines Talk to your doctor or health care professional before taking any of these medicines: -acetaminophen -aspirin -ibuprofen -ketoprofen -naproxen This list may not describe all possible interactions. Give your health care provider a list of all the medicines, herbs, non-prescription drugs, or dietary supplements you use. Also tell them if you smoke, drink alcohol, or use illegal drugs. Some items may interact with your medicine. What should I watch for while using this medicine? Visit your doctor for checks on your progress. This drug may make you feel generally unwell. This is not uncommon, as chemotherapy can affect healthy cells as well as cancer cells. Report any side effects. Continue your course of treatment even though you feel ill unless your doctor tells you to stop. In some cases, you may be given additional medicines to help with side effects. Follow all directions for their use. Call your doctor or health care professional for advice if you get a fever, chills or sore throat, or other symptoms of a cold or flu. Do not treat yourself. This drug decreases your body's ability to fight infections. Try to avoid being around people who are sick. This medicine may increase your risk to bruise or bleed. Call your doctor or health care professional if you notice any unusual bleeding. Be careful brushing and flossing your teeth or using a toothpick because you may get an infection or bleed more easily. If you have any dental work done, tell your dentist you are receiving this medicine. Avoid taking products that contain aspirin, acetaminophen, ibuprofen, naproxen, or ketoprofen unless instructed by your doctor. These medicines may hide a fever. Do not become pregnant while taking this medicine. Women should    inform their doctor if they wish to become pregnant or think they might be pregnant. There is a potential for serious side effects to an unborn child. Talk to your health care professional or pharmacist for more information. Do not breast-feed an infant while taking this medicine. Men should inform their doctor if they wish to father a child. This medicine may lower sperm counts. Do not treat diarrhea with over the counter products. Contact your doctor if you have diarrhea that lasts more than 2 days or if it is severe and watery. This medicine can make you more sensitive to the sun. Keep out of the sun. If you cannot avoid being in the sun, wear protective clothing and use sunscreen. Do not use sun lamps or tanning beds/booths. What side effects may I notice from receiving this medicine? Side effects that you should report to your doctor or health care professional as soon as possible: -allergic reactions like skin rash, itching or hives, swelling of the face, lips, or tongue -low blood counts - this medicine may decrease the number of white blood cells, red blood cells and platelets. You may be at increased risk for infections and bleeding. -signs of infection - fever or chills, cough, sore throat, pain or difficulty passing urine -signs of decreased platelets or bleeding - bruising, pinpoint red spots on the skin, black, tarry stools, blood in the urine -signs of decreased red blood cells - unusually weak or tired, fainting spells, lightheadedness -breathing problems -changes in vision -chest pain -mouth sores -nausea and vomiting -pain, swelling, redness at site where injected -pain, tingling, numbness in the hands or feet -redness, swelling, or sores on hands or feet -stomach pain -unusual bleeding Side effects that usually do not require medical attention (report to your doctor or health care professional if they continue or are bothersome): -changes in finger or toe  nails -diarrhea -dry or itchy skin -hair loss -headache -loss of appetite -sensitivity of eyes to the light -stomach upset -unusually teary eyes This list may not describe all possible side effects. Call your doctor for medical advice about side effects. You may report side effects to FDA at 1-800-FDA-1088. Where should I keep my medicine? This drug is given in a hospital or clinic and will not be stored at home. NOTE: This sheet is a summary. It may not cover all possible information. If you have questions about this medicine, talk to your doctor, pharmacist, or health care provider.    2016, Elsevier/Gold Standard. (2008-01-01 13:53:16)  

## 2015-12-30 NOTE — Progress Notes (Signed)
Pt refused to receive neulasta today because she stated that she did not discuss proceeding adding this treatment after her pump stop with Dr. Benay Spice. Pt wants to discuss neulasta shot on her next visit. Pt education done about neulasta with pt and possible side effects that she may expect. Pt states that she wants to discuss this treatment with the dr before adding this to her treatment plan. Notified pharmacy of pt refusal. AVS declined at this time.

## 2016-01-03 ENCOUNTER — Ambulatory Visit (HOSPITAL_COMMUNITY): Payer: 59

## 2016-01-03 ENCOUNTER — Other Ambulatory Visit: Payer: 59

## 2016-01-04 ENCOUNTER — Ambulatory Visit: Payer: 59

## 2016-01-04 ENCOUNTER — Other Ambulatory Visit: Payer: 59

## 2016-01-04 ENCOUNTER — Ambulatory Visit: Payer: 59 | Admitting: Nurse Practitioner

## 2016-01-05 ENCOUNTER — Other Ambulatory Visit (HOSPITAL_COMMUNITY): Payer: 59

## 2016-01-06 ENCOUNTER — Ambulatory Visit (HOSPITAL_COMMUNITY)
Admission: RE | Admit: 2016-01-06 | Discharge: 2016-01-06 | Disposition: A | Discharge status: Home / Self Care | Payer: 59 | Source: Ambulatory Visit | Attending: Oncology | Admitting: Oncology

## 2016-01-06 ENCOUNTER — Other Ambulatory Visit: Payer: 59

## 2016-01-06 ENCOUNTER — Ambulatory Visit: Payer: 59

## 2016-01-06 ENCOUNTER — Encounter (HOSPITAL_COMMUNITY): Payer: Self-pay

## 2016-01-06 ENCOUNTER — Ambulatory Visit: Payer: 59 | Admitting: Oncology

## 2016-01-06 DIAGNOSIS — C180 Malignant neoplasm of cecum: Secondary | ICD-10-CM | POA: Diagnosis present

## 2016-01-06 DIAGNOSIS — R918 Other nonspecific abnormal finding of lung field: Secondary | ICD-10-CM | POA: Diagnosis not present

## 2016-01-06 DIAGNOSIS — R590 Localized enlarged lymph nodes: Secondary | ICD-10-CM | POA: Diagnosis not present

## 2016-01-06 DIAGNOSIS — C787 Secondary malignant neoplasm of liver and intrahepatic bile duct: Secondary | ICD-10-CM | POA: Insufficient documentation

## 2016-01-06 MED ORDER — IOPAMIDOL (ISOVUE-300) INJECTION 61%
100.0000 mL | Freq: Once | INTRAVENOUS | Status: AC | PRN
  Administered 2016-01-06: 100 mL via INTRAVENOUS

## 2016-01-06 MED ORDER — HEPARIN SOD (PORK) LOCK FLUSH 100 UNIT/ML IV SOLN
INTRAVENOUS | Status: AC
  Filled 2016-01-06: qty 5

## 2016-01-07 ENCOUNTER — Telehealth: Payer: Self-pay | Admitting: *Deleted

## 2016-01-07 NOTE — Telephone Encounter (Signed)
Per Dr. Benay Spice, pt notified that CT shows decreased size of lung and liver lesions and abdominal nodes. Pt appreciative of call and has no questions or concerns at this time.

## 2016-01-07 NOTE — Telephone Encounter (Signed)
-----   Message from Ladell Pier, MD sent at 01/07/2016  3:37 PM EDT ----- Please call patient, CT shows decreased size of lung and liver lesions, and abdominal nodes.

## 2016-01-09 ENCOUNTER — Other Ambulatory Visit: Payer: Self-pay | Admitting: Oncology

## 2016-01-10 ENCOUNTER — Other Ambulatory Visit: Payer: Self-pay

## 2016-01-10 DIAGNOSIS — Z95828 Presence of other vascular implants and grafts: Secondary | ICD-10-CM | POA: Insufficient documentation

## 2016-01-11 ENCOUNTER — Other Ambulatory Visit: Payer: Self-pay | Admitting: *Deleted

## 2016-01-11 ENCOUNTER — Ambulatory Visit (HOSPITAL_BASED_OUTPATIENT_CLINIC_OR_DEPARTMENT_OTHER): Payer: 59

## 2016-01-11 ENCOUNTER — Ambulatory Visit: Payer: 59

## 2016-01-11 ENCOUNTER — Encounter: Payer: Self-pay | Admitting: *Deleted

## 2016-01-11 ENCOUNTER — Other Ambulatory Visit (HOSPITAL_BASED_OUTPATIENT_CLINIC_OR_DEPARTMENT_OTHER): Payer: 59

## 2016-01-11 ENCOUNTER — Ambulatory Visit (HOSPITAL_BASED_OUTPATIENT_CLINIC_OR_DEPARTMENT_OTHER): Payer: 59 | Admitting: Oncology

## 2016-01-11 ENCOUNTER — Telehealth: Payer: Self-pay | Admitting: Oncology

## 2016-01-11 VITALS — BP 143/80 | HR 64 | Temp 97.8°F | Resp 17 | Ht 66.0 in | Wt 154.1 lb

## 2016-01-11 VITALS — BP 122/78

## 2016-01-11 DIAGNOSIS — G620 Drug-induced polyneuropathy: Secondary | ICD-10-CM

## 2016-01-11 DIAGNOSIS — C801 Malignant (primary) neoplasm, unspecified: Principal | ICD-10-CM

## 2016-01-11 DIAGNOSIS — Z5111 Encounter for antineoplastic chemotherapy: Secondary | ICD-10-CM | POA: Diagnosis not present

## 2016-01-11 DIAGNOSIS — Z8672 Personal history of thrombophlebitis: Secondary | ICD-10-CM

## 2016-01-11 DIAGNOSIS — D701 Agranulocytosis secondary to cancer chemotherapy: Secondary | ICD-10-CM | POA: Diagnosis not present

## 2016-01-11 DIAGNOSIS — C180 Malignant neoplasm of cecum: Secondary | ICD-10-CM

## 2016-01-11 DIAGNOSIS — C786 Secondary malignant neoplasm of retroperitoneum and peritoneum: Secondary | ICD-10-CM

## 2016-01-11 DIAGNOSIS — C787 Secondary malignant neoplasm of liver and intrahepatic bile duct: Secondary | ICD-10-CM | POA: Diagnosis not present

## 2016-01-11 DIAGNOSIS — C182 Malignant neoplasm of ascending colon: Secondary | ICD-10-CM

## 2016-01-11 DIAGNOSIS — C7802 Secondary malignant neoplasm of left lung: Secondary | ICD-10-CM

## 2016-01-11 DIAGNOSIS — C7801 Secondary malignant neoplasm of right lung: Secondary | ICD-10-CM

## 2016-01-11 DIAGNOSIS — Z5112 Encounter for antineoplastic immunotherapy: Secondary | ICD-10-CM | POA: Diagnosis not present

## 2016-01-11 DIAGNOSIS — Z95828 Presence of other vascular implants and grafts: Secondary | ICD-10-CM

## 2016-01-11 DIAGNOSIS — C7800 Secondary malignant neoplasm of unspecified lung: Secondary | ICD-10-CM

## 2016-01-11 DIAGNOSIS — K1231 Oral mucositis (ulcerative) due to antineoplastic therapy: Secondary | ICD-10-CM

## 2016-01-11 DIAGNOSIS — Z800 Family history of malignant neoplasm of digestive organs: Secondary | ICD-10-CM

## 2016-01-11 LAB — CBC WITH DIFFERENTIAL/PLATELET
BASO%: 0.6 % (ref 0.0–2.0)
Basophils Absolute: 0 10*3/uL (ref 0.0–0.1)
EOS ABS: 0.2 10*3/uL (ref 0.0–0.5)
EOS%: 7.7 % — AB (ref 0.0–7.0)
HCT: 34.9 % (ref 34.8–46.6)
HEMOGLOBIN: 11.6 g/dL (ref 11.6–15.9)
LYMPH%: 37.1 % (ref 14.0–49.7)
MCH: 30.4 pg (ref 25.1–34.0)
MCHC: 33.2 g/dL (ref 31.5–36.0)
MCV: 91.6 fL (ref 79.5–101.0)
MONO#: 0.4 10*3/uL (ref 0.1–0.9)
MONO%: 12 % (ref 0.0–14.0)
NEUT%: 42.6 % (ref 38.4–76.8)
NEUTROS ABS: 1.4 10*3/uL — AB (ref 1.5–6.5)
Platelets: 186 10*3/uL (ref 145–400)
RBC: 3.81 10*6/uL (ref 3.70–5.45)
RDW: 16.9 % — AB (ref 11.2–14.5)
WBC: 3.2 10*3/uL — AB (ref 3.9–10.3)
lymph#: 1.2 10*3/uL (ref 0.9–3.3)

## 2016-01-11 LAB — COMPREHENSIVE METABOLIC PANEL
ALT: 17 U/L (ref 0–55)
AST: 19 U/L (ref 5–34)
Albumin: 3.7 g/dL (ref 3.5–5.0)
Alkaline Phosphatase: 65 U/L (ref 40–150)
Anion Gap: 7 mEq/L (ref 3–11)
BILIRUBIN TOTAL: 0.37 mg/dL (ref 0.20–1.20)
BUN: 10.3 mg/dL (ref 7.0–26.0)
CO2: 23 meq/L (ref 22–29)
Calcium: 9.5 mg/dL (ref 8.4–10.4)
Chloride: 106 mEq/L (ref 98–109)
Creatinine: 0.8 mg/dL (ref 0.6–1.1)
EGFR: 77 mL/min/{1.73_m2} — ABNORMAL LOW (ref >90)
GLUCOSE: 83 mg/dL (ref 70–140)
Potassium: 4 mEq/L (ref 3.5–5.1)
SODIUM: 137 meq/L (ref 136–145)
TOTAL PROTEIN: 6.5 g/dL (ref 6.4–8.3)

## 2016-01-11 LAB — UA PROTEIN, DIPSTICK - CHCC: Protein, ur: NEGATIVE mg/dL

## 2016-01-11 MED ORDER — DIPHENHYDRAMINE HCL 50 MG/ML IJ SOLN
25.0000 mg | Freq: Once | INTRAMUSCULAR | Status: AC
  Administered 2016-01-11: 25 mg via INTRAVENOUS

## 2016-01-11 MED ORDER — ATROPINE SULFATE 1 MG/ML IJ SOLN
INTRAMUSCULAR | Status: AC
  Filled 2016-01-11: qty 1

## 2016-01-11 MED ORDER — DIPHENHYDRAMINE HCL 50 MG/ML IJ SOLN
INTRAMUSCULAR | Status: AC
  Filled 2016-01-11: qty 1

## 2016-01-11 MED ORDER — SODIUM CHLORIDE 0.9 % IV SOLN
Freq: Once | INTRAVENOUS | Status: AC
  Administered 2016-01-11: 11:00:00 via INTRAVENOUS

## 2016-01-11 MED ORDER — DEXTROSE 5 % IV SOLN
177.0000 mg/m2 | Freq: Once | INTRAVENOUS | Status: AC
  Administered 2016-01-11: 320 mg via INTRAVENOUS
  Filled 2016-01-11: qty 12

## 2016-01-11 MED ORDER — FAMOTIDINE IN NACL 20-0.9 MG/50ML-% IV SOLN
20.0000 mg | Freq: Two times a day (BID) | INTRAVENOUS | Status: DC
  Administered 2016-01-11: 20 mg via INTRAVENOUS

## 2016-01-11 MED ORDER — SODIUM CHLORIDE 0.9 % IV SOLN
1800.0000 mg/m2 | INTRAVENOUS | Status: DC
  Administered 2016-01-11: 3250 mg via INTRAVENOUS
  Filled 2016-01-11: qty 65

## 2016-01-11 MED ORDER — LEUCOVORIN CALCIUM INJECTION 350 MG
300.0000 mg/m2 | Freq: Once | INTRAVENOUS | Status: AC
  Administered 2016-01-11: 544 mg via INTRAVENOUS
  Filled 2016-01-11: qty 27.2

## 2016-01-11 MED ORDER — FLUOROURACIL CHEMO INJECTION 2.5 GM/50ML
300.0000 mg/m2 | Freq: Once | INTRAVENOUS | Status: AC
  Administered 2016-01-11: 550 mg via INTRAVENOUS
  Filled 2016-01-11: qty 11

## 2016-01-11 MED ORDER — SODIUM CHLORIDE 0.9 % IV SOLN
5.0000 mg/kg | Freq: Once | INTRAVENOUS | Status: AC
  Administered 2016-01-11: 325 mg via INTRAVENOUS
  Filled 2016-01-11: qty 13

## 2016-01-11 MED ORDER — FAMOTIDINE IN NACL 20-0.9 MG/50ML-% IV SOLN
INTRAVENOUS | Status: AC
  Filled 2016-01-11: qty 50

## 2016-01-11 MED ORDER — PALONOSETRON HCL INJECTION 0.25 MG/5ML
INTRAVENOUS | Status: AC
  Filled 2016-01-11: qty 5

## 2016-01-11 MED ORDER — PALONOSETRON HCL INJECTION 0.25 MG/5ML
0.2500 mg | Freq: Once | INTRAVENOUS | Status: AC
  Administered 2016-01-11: 0.25 mg via INTRAVENOUS

## 2016-01-11 MED ORDER — SODIUM CHLORIDE 0.9 % IJ SOLN
10.0000 mL | INTRAMUSCULAR | Status: AC | PRN
  Administered 2016-01-11: 10 mL
  Filled 2016-01-11: qty 10

## 2016-01-11 MED ORDER — FOSAPREPITANT DIMEGLUMINE INJECTION 150 MG
Freq: Once | INTRAVENOUS | Status: AC
  Administered 2016-01-11: 11:00:00 via INTRAVENOUS
  Filled 2016-01-11: qty 5

## 2016-01-11 MED ORDER — ATROPINE SULFATE 1 MG/ML IJ SOLN
0.5000 mg | Freq: Once | INTRAMUSCULAR | Status: AC | PRN
  Administered 2016-01-11: 0.5 mg via INTRAVENOUS

## 2016-01-11 NOTE — Addendum Note (Signed)
Addended by: Brien Few on: 01/11/2016 09:56 AM   Modules accepted: Orders

## 2016-01-11 NOTE — Patient Instructions (Signed)
[facility] Discharge Instructions for Patients Receiving Chemotherapy  Today you received the following chemotherapy agents Avastin, Leucovorin, Irinotecan and Fluorouracil.   To help prevent nausea and vomiting after your treatment, we encourage you to take your nausea medication as directed.   If you develop nausea and vomiting that is not controlled by your nausea medication, call the clinic.   BELOW ARE SYMPTOMS THAT SHOULD BE REPORTED IMMEDIATELY:  *FEVER GREATER THAN 100.5 F  *CHILLS WITH OR WITHOUT FEVER  NAUSEA AND VOMITING THAT IS NOT CONTROLLED WITH YOUR NAUSEA MEDICATION  *UNUSUAL SHORTNESS OF BREATH  *UNUSUAL BRUISING OR BLEEDING  TENDERNESS IN MOUTH AND THROAT WITH OR WITHOUT PRESENCE OF ULCERS  *URINARY PROBLEMS  *BOWEL PROBLEMS  UNUSUAL RASH Items with * indicate a potential emergency and should be followed up as soon as possible.  Feel free to call the clinic you have any questions or concerns. The clinic phone number is [phone].  Please show the CHEMO ALERT CARD at check-in to the Emergency Department and triage nurse.    

## 2016-01-11 NOTE — Patient Instructions (Signed)

## 2016-01-11 NOTE — Progress Notes (Signed)
[facility] OFFICE PROGRESS NOTE   Diagnosis:  Colon cancer  INTERVAL HISTORY:    Martha Becker returns as scheduled. She completed another cycle of FOLFIRI/Avastin on 12/28/2015. No nausea/vomiting or diarrhea following chemotherapy. She has mild numbness in the extremities. She reports malaise for several days following chemotherapy. Martha Becker reports being very active at home.  Objective:  Vital signs in last 24 hours:  Blood pressure 143/80, pulse 64, temperature 97.8 F (36.6 C), temperature source Oral, resp. rate 17, height _0  (1.676 m), weight 154 lb 1.6 oz (69.899 kg), SpO2 100 %.    HEENT:  No thrush or ulcers Resp:  Lungs clear bilaterally Cardio:  Regular rate and rhythm GI:  No hepatomegaly, nontender Vascular:  No leg  edema  Skin: hyperpigmentation of the hands and feet   Portacath/PICC-without erythema  Lab Results:  Lab Results  Component Value Date   WBC 3.2* 01/11/2016   HGB 11.6 01/11/2016   HCT 34.9 01/11/2016   MCV 91.6 01/11/2016   PLT 186 01/11/2016   NEUTROABS 1.4* 01/11/2016      Lab Results  Component Value Date   CEA1 9.4* 11/23/2015    Imaging: CTs chest, abdomen, and pelvis 01/06/2016-images reviewed with Martha Becker  Medications: I have reviewed the patient's current medications.  Assessment/Plan: 1. 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, status post a right colectomy 08/31/2014  no loss of mismatch repair protein expression, microsatellite stable  K-ras wild-type by standard testing,NRAS Q61K mutation identified on Foundation 1 testing  APC alteration detected  Staging CT scans December 2015 and PET Becker 09/22/2014 consistent with metastatic bilateral lung nodules, metastatic retroperitoneal lymphadenopathy, and a probable left liver metastasis  Cycle 1 CAPOX 10/29/2014 (Xeloda discontinued day 9 secondary to diarrhea)   CT 11/14/2014 revealed  progressive metastatic disease at the lung bases and liver compared to a PET Becker from 09/22/2014, mild improvement in retroperitoneal lymphadenopathy  Cycle 1 FOLFOX 12/02/2014  Cycle 2 FOLFOX, Avastin added 12/16/2014  Cycle 3 FOLFOX/Avastin 12/30/2014  Cycle 4 FOLFOX/Avastin 01/13/2015  Cycle 5 FOLFOX/Avastin 01/27/2015  CT 02/10/2015 with improvement in hepatic metastases, mild improvement of lung metastases and retroperitoneal lymphadenopathy  Cycle 6 FOLFOX/Avastin 02/16/2015  Cycle 7 FOLFOX/Avastin 03/02/2015  Cycle 8 FOLFOX/Avastin 03/16/2015  Cycle 9 FOLFOX/Avastin 04/13/2015  Restaging CTs 04/20/2015 with a decrease in the size of pulmonary nodules, liver lesions, and retroperitoneal lymph nodes  Restaging CTs 07/27/2015 revealed enlargement of bilateral lung nodules, stable liver lesions, decreased periaortic adenopathy  CTs 10/05/2015 revealed further enlargement of bilateral lung nodules ,enlargement of liver lesions, and enlargement of peritoneal lymph nodes  Cycle 1 FOLFIRI/Avastin 10/12/2015  Cycle 2 FOLFIRI/Avastin 10/26/2015  Cycle 3 FOLFIRI/Avastin 11/09/2015  Cycle 4 FOLFIRI/Avastin 11/23/2015  Cycle 5 FOLFIRI/Avastin  12/28/2015   CTs 01/06/2016- decreased size of lung nodules , decreased / regression of liver lesions, regression of retroperitoneal nodes   Cycle 6 FOLFIRI/Avastin 01/11/2016 (Neulasta added) disease  2. Elevated TSH, PET Becker 09/22/2014 with diffuse increased metabolic uptake in the thyroid   3. Family history of multiple cancers including colon cancer in her paternal grandfather and rectal cancer in a paternal first cousin  95. Admission 11/14/2014 with nausea and diarrhea, most likely secondary to capecitabine induced enteritis-resolved. CT 11/14/2014 consistent with ileitis  5.Superficial phlebitis left hand/wrist 12/30/2014.  6. Early oxaliplatin neuropathy  7. Pain at the right gluteus and right leg-MRI  of the lumbar spine 07/13/2015 confirmed a disc protrusion at L5-S1  8. Diaphoresis, rhinorrhea, shortness of breath and cough following cycle 1 FOLFIRI/Avastin, did not occur following cycle 2  9. Oral mucositis secondary to chemotherapy  10. Nonproductive cough-most likely secondary to colon cancer; improved 11/23/2015  11. Neutropenia secondary to chemotherapy -Neulasta added with cycle 6 FOLFIRI 01/11/2016   Disposition:   Martha Becker appears well. She has completed 5 cycles of FOLFIRI/Avastin. The restaging CTs confirmed a response to therapy. The plan is to continue FOLFIRI/Avastin on a 2 week schedule. Chemotherapy was delayed last month secondary to neutropenia. She declined Neulasta last month. I reviewed the risks associated with Neulasta therapy. She agrees to receive Neulasta beginning with the cycle of chemotherapy today.    Martha Becker will return for an office visit and chemotherapy in 2 weeks.  Martha Coder, MD  01/11/2016  11:09 AM

## 2016-01-11 NOTE — Progress Notes (Signed)
Oncology Nurse Navigator Documentation  Oncology Nurse Navigator Flowsheets 01/11/2016  Navigator Location [facility]Med Onc  Navigator Encounter Type Follow-up Appt  Telephone -  Treatment Initiated Date -  Patient Visit Type MedOnc  Treatment Phase Active Tx--Avastin & FOLFIRI #6  Barriers/Navigation Needs Education;Family concerns  Education Coping with Diagnosis/ Prognosis;Other--neutropenia and need for Neulasta and side effects  Interventions Education Method  Referrals -  Education Method Verbal;Teach-back  Support Groups/Services -Supportive listening provided and stressed the good news of her CT scan  Specialty Items/DME -  [employer] -  Time Spent with Patient 30

## 2016-01-11 NOTE — Telephone Encounter (Signed)
per pof to sch pt appt--sent MW emailt o sch tmrt-pt to get updated copy b4 leaving trmt

## 2016-01-11 NOTE — Progress Notes (Signed)
OK to treat with ANC 1.4 per Dr. Benay Spice. Pt will receive Neulasta with pump disconnect.

## 2016-01-12 ENCOUNTER — Encounter: Payer: Self-pay | Admitting: *Deleted

## 2016-01-12 NOTE — Progress Notes (Signed)
[place] Work  Clinical Social Work was referred by patient for possible gas card assistance. Pt left vm for CSW, but phone call cut out several times and message was hard to decipher.   It sounded as if pt was requesting gas card assistance. Clinical Social Worker contacted patient via cell and left message for her to return CSW call.   CSW left message for pt with both CSW and financial advocate info as fin advocate has access to gas cards.     Loren Racer, [place] Worker [place]  [place] Phone: [phone] Fax: [phone]

## 2016-01-13 ENCOUNTER — Ambulatory Visit (HOSPITAL_BASED_OUTPATIENT_CLINIC_OR_DEPARTMENT_OTHER): Payer: 59

## 2016-01-13 VITALS — BP 111/79 | HR 63 | Temp 98.2°F

## 2016-01-13 DIAGNOSIS — C182 Malignant neoplasm of ascending colon: Secondary | ICD-10-CM | POA: Diagnosis not present

## 2016-01-13 DIAGNOSIS — Z5189 Encounter for other specified aftercare: Secondary | ICD-10-CM

## 2016-01-13 DIAGNOSIS — C7802 Secondary malignant neoplasm of left lung: Secondary | ICD-10-CM | POA: Diagnosis not present

## 2016-01-13 DIAGNOSIS — C787 Secondary malignant neoplasm of liver and intrahepatic bile duct: Secondary | ICD-10-CM | POA: Diagnosis not present

## 2016-01-13 DIAGNOSIS — C7801 Secondary malignant neoplasm of right lung: Secondary | ICD-10-CM

## 2016-01-13 DIAGNOSIS — C180 Malignant neoplasm of cecum: Secondary | ICD-10-CM

## 2016-01-13 MED ORDER — SODIUM CHLORIDE 0.9% FLUSH
10.0000 mL | INTRAVENOUS | Status: DC | PRN
  Administered 2016-01-13: 10 mL
  Filled 2016-01-13: qty 10

## 2016-01-13 MED ORDER — PEGFILGRASTIM INJECTION 6 MG/0.6ML ~~LOC~~
6.0000 mg | PREFILLED_SYRINGE | Freq: Once | SUBCUTANEOUS | Status: AC
  Administered 2016-01-13: 6 mg via SUBCUTANEOUS
  Filled 2016-01-13: qty 0.6

## 2016-01-13 MED ORDER — HEPARIN SOD (PORK) LOCK FLUSH 100 UNIT/ML IV SOLN
500.0000 [IU] | Freq: Once | INTRAVENOUS | Status: AC | PRN
  Administered 2016-01-13: 500 [IU]
  Filled 2016-01-13: qty 5

## 2016-01-19 ENCOUNTER — Telehealth: Payer: Self-pay | Admitting: Oncology

## 2016-01-19 ENCOUNTER — Telehealth: Payer: Self-pay | Admitting: *Deleted

## 2016-01-19 NOTE — Telephone Encounter (Signed)
TC from patient this am. She has 2 issues.  #1  She is asking if she can have her treatments every 3 weeks instead of every 2 weeks. She states she is not recovering as quickly as she would like and when she does recover, it is time for another treatment.  She is not able to get things done in her life/home due to feeling poorly much of the time.  #2 She states she has developed blisters under her toes on her left foot 3 days after her last chemo (5/2/170

## 2016-01-19 NOTE — Telephone Encounter (Signed)
lvm for pt regarding to 5.16 cx and moved to 5.23

## 2016-01-19 NOTE — Telephone Encounter (Signed)
Per staff message and POF I have scheduled appts. Advised GI navigator  of appts. JMW

## 2016-01-19 NOTE — Telephone Encounter (Signed)
Discussed with Dr. Benay Spice: OK to move 5/16 appt out to 5/23. Pt requests Tuesdays. Order to schedulers. Returned call to pt, instructed her to monitor blisters on foot. Call office if they aren't healing. Instructed her to avoid tight fitting shoes, extreme temperatures and high impact exercise. She is unsure if it is related to the chemo or "poison oak". She understands to call office if needed.

## 2016-01-25 ENCOUNTER — Ambulatory Visit: Payer: 59

## 2016-01-25 ENCOUNTER — Ambulatory Visit: Payer: 59 | Admitting: Nurse Practitioner

## 2016-01-27 ENCOUNTER — Ambulatory Visit: Payer: 59

## 2016-02-01 ENCOUNTER — Ambulatory Visit: Payer: 59

## 2016-02-01 ENCOUNTER — Other Ambulatory Visit (HOSPITAL_BASED_OUTPATIENT_CLINIC_OR_DEPARTMENT_OTHER): Payer: 59

## 2016-02-01 ENCOUNTER — Ambulatory Visit (HOSPITAL_BASED_OUTPATIENT_CLINIC_OR_DEPARTMENT_OTHER): Payer: 59

## 2016-02-01 ENCOUNTER — Other Ambulatory Visit: Payer: Self-pay | Admitting: Oncology

## 2016-02-01 ENCOUNTER — Encounter: Payer: Self-pay | Admitting: *Deleted

## 2016-02-01 ENCOUNTER — Ambulatory Visit (HOSPITAL_BASED_OUTPATIENT_CLINIC_OR_DEPARTMENT_OTHER): Payer: 59 | Admitting: Nurse Practitioner

## 2016-02-01 VITALS — BP 124/75 | HR 71 | Temp 97.8°F | Resp 20 | Ht 66.0 in | Wt 154.1 lb

## 2016-02-01 DIAGNOSIS — Z95828 Presence of other vascular implants and grafts: Secondary | ICD-10-CM

## 2016-02-01 DIAGNOSIS — C180 Malignant neoplasm of cecum: Secondary | ICD-10-CM

## 2016-02-01 DIAGNOSIS — Z800 Family history of malignant neoplasm of digestive organs: Secondary | ICD-10-CM

## 2016-02-01 DIAGNOSIS — C182 Malignant neoplasm of ascending colon: Secondary | ICD-10-CM

## 2016-02-01 DIAGNOSIS — C801 Malignant (primary) neoplasm, unspecified: Secondary | ICD-10-CM

## 2016-02-01 DIAGNOSIS — C787 Secondary malignant neoplasm of liver and intrahepatic bile duct: Secondary | ICD-10-CM

## 2016-02-01 DIAGNOSIS — Z5111 Encounter for antineoplastic chemotherapy: Secondary | ICD-10-CM | POA: Diagnosis not present

## 2016-02-01 DIAGNOSIS — C7801 Secondary malignant neoplasm of right lung: Secondary | ICD-10-CM | POA: Diagnosis not present

## 2016-02-01 DIAGNOSIS — D701 Agranulocytosis secondary to cancer chemotherapy: Secondary | ICD-10-CM

## 2016-02-01 DIAGNOSIS — G620 Drug-induced polyneuropathy: Secondary | ICD-10-CM

## 2016-02-01 DIAGNOSIS — C7802 Secondary malignant neoplasm of left lung: Secondary | ICD-10-CM | POA: Diagnosis not present

## 2016-02-01 DIAGNOSIS — Z8672 Personal history of thrombophlebitis: Secondary | ICD-10-CM

## 2016-02-01 DIAGNOSIS — C7800 Secondary malignant neoplasm of unspecified lung: Secondary | ICD-10-CM

## 2016-02-01 DIAGNOSIS — Z5112 Encounter for antineoplastic immunotherapy: Secondary | ICD-10-CM | POA: Diagnosis not present

## 2016-02-01 DIAGNOSIS — C786 Secondary malignant neoplasm of retroperitoneum and peritoneum: Secondary | ICD-10-CM

## 2016-02-01 DIAGNOSIS — K1231 Oral mucositis (ulcerative) due to antineoplastic therapy: Secondary | ICD-10-CM

## 2016-02-01 LAB — CBC WITH DIFFERENTIAL/PLATELET
BASO%: 1.1 % (ref 0.0–2.0)
BASOS ABS: 0.1 10*3/uL (ref 0.0–0.1)
EOS ABS: 0.1 10*3/uL (ref 0.0–0.5)
EOS%: 2.3 % (ref 0.0–7.0)
HEMATOCRIT: 38.6 % (ref 34.8–46.6)
HGB: 12.7 g/dL (ref 11.6–15.9)
LYMPH%: 23.7 % (ref 14.0–49.7)
MCH: 30.6 pg (ref 25.1–34.0)
MCHC: 32.8 g/dL (ref 31.5–36.0)
MCV: 93.3 fL (ref 79.5–101.0)
MONO#: 0.6 10*3/uL (ref 0.1–0.9)
MONO%: 9.9 % (ref 0.0–14.0)
NEUT%: 63 % (ref 38.4–76.8)
NEUTROS ABS: 4 10*3/uL (ref 1.5–6.5)
PLATELETS: 222 10*3/uL (ref 145–400)
RBC: 4.14 10*6/uL (ref 3.70–5.45)
RDW: 16.9 % — ABNORMAL HIGH (ref 11.2–14.5)
WBC: 6.4 10*3/uL (ref 3.9–10.3)
lymph#: 1.5 10*3/uL (ref 0.9–3.3)

## 2016-02-01 LAB — COMPREHENSIVE METABOLIC PANEL
ALBUMIN: 4 g/dL (ref 3.5–5.0)
ALK PHOS: 75 U/L (ref 40–150)
ALT: 17 U/L (ref 0–55)
ANION GAP: 8 meq/L (ref 3–11)
AST: 21 U/L (ref 5–34)
BILIRUBIN TOTAL: 0.49 mg/dL (ref 0.20–1.20)
BUN: 8 mg/dL (ref 7.0–26.0)
CALCIUM: 9.8 mg/dL (ref 8.4–10.4)
CO2: 24 mEq/L (ref 22–29)
CREATININE: 0.8 mg/dL (ref 0.6–1.1)
Chloride: 103 mEq/L (ref 98–109)
EGFR: 81 mL/min/{1.73_m2} — ABNORMAL LOW (ref >90)
Glucose: 102 mg/dl (ref 70–140)
Potassium: 3.8 mEq/L (ref 3.5–5.1)
Sodium: 135 mEq/L — ABNORMAL LOW (ref 136–145)
TOTAL PROTEIN: 6.9 g/dL (ref 6.4–8.3)

## 2016-02-01 LAB — UA PROTEIN, DIPSTICK - CHCC: PROTEIN: NEGATIVE mg/dL

## 2016-02-01 MED ORDER — ATROPINE SULFATE 1 MG/ML IJ SOLN
INTRAMUSCULAR | Status: AC
  Filled 2016-02-01: qty 1

## 2016-02-01 MED ORDER — LEUCOVORIN CALCIUM INJECTION 350 MG
300.0000 mg/m2 | Freq: Once | INTRAVENOUS | Status: AC
  Administered 2016-02-01: 544 mg via INTRAVENOUS
  Filled 2016-02-01: qty 27.2

## 2016-02-01 MED ORDER — FAMOTIDINE IN NACL 20-0.9 MG/50ML-% IV SOLN
INTRAVENOUS | Status: AC
  Filled 2016-02-01: qty 50

## 2016-02-01 MED ORDER — DIPHENHYDRAMINE HCL 50 MG/ML IJ SOLN
25.0000 mg | Freq: Once | INTRAMUSCULAR | Status: AC
  Administered 2016-02-01: 25 mg via INTRAVENOUS

## 2016-02-01 MED ORDER — PALONOSETRON HCL INJECTION 0.25 MG/5ML
0.2500 mg | Freq: Once | INTRAVENOUS | Status: AC
  Administered 2016-02-01: 0.25 mg via INTRAVENOUS

## 2016-02-01 MED ORDER — SODIUM CHLORIDE 0.9 % IJ SOLN
10.0000 mL | INTRAMUSCULAR | Status: AC | PRN
  Administered 2016-02-01: 10 mL
  Filled 2016-02-01: qty 10

## 2016-02-01 MED ORDER — DIPHENHYDRAMINE HCL 50 MG/ML IJ SOLN
INTRAMUSCULAR | Status: AC
  Filled 2016-02-01: qty 1

## 2016-02-01 MED ORDER — FLUOROURACIL CHEMO INJECTION 2.5 GM/50ML
300.0000 mg/m2 | Freq: Once | INTRAVENOUS | Status: AC
  Administered 2016-02-01: 550 mg via INTRAVENOUS
  Filled 2016-02-01: qty 11

## 2016-02-01 MED ORDER — PALONOSETRON HCL INJECTION 0.25 MG/5ML
INTRAVENOUS | Status: AC
  Filled 2016-02-01: qty 5

## 2016-02-01 MED ORDER — SODIUM CHLORIDE 0.9 % IV SOLN
Freq: Once | INTRAVENOUS | Status: AC
  Administered 2016-02-01: 14:00:00 via INTRAVENOUS
  Filled 2016-02-01: qty 5

## 2016-02-01 MED ORDER — ATROPINE SULFATE 1 MG/ML IJ SOLN
0.5000 mg | Freq: Once | INTRAMUSCULAR | Status: AC | PRN
Start: 2016-02-01 — End: 2016-02-01
  Administered 2016-02-01: 0.5 mg via INTRAVENOUS

## 2016-02-01 MED ORDER — SODIUM CHLORIDE 0.9 % IV SOLN
1800.0000 mg/m2 | INTRAVENOUS | Status: DC
  Administered 2016-02-01: 3250 mg via INTRAVENOUS
  Filled 2016-02-01: qty 65

## 2016-02-01 MED ORDER — SODIUM CHLORIDE 0.9 % IV SOLN
Freq: Once | INTRAVENOUS | Status: AC
  Administered 2016-02-01: 13:00:00 via INTRAVENOUS

## 2016-02-01 MED ORDER — SODIUM CHLORIDE 0.9 % IV SOLN
5.0000 mg/kg | Freq: Once | INTRAVENOUS | Status: AC
  Administered 2016-02-01: 325 mg via INTRAVENOUS
  Filled 2016-02-01: qty 13

## 2016-02-01 MED ORDER — FAMOTIDINE IN NACL 20-0.9 MG/50ML-% IV SOLN
20.0000 mg | Freq: Once | INTRAVENOUS | Status: AC
  Administered 2016-02-01: 20 mg via INTRAVENOUS

## 2016-02-01 MED ORDER — IRINOTECAN HCL CHEMO INJECTION 100 MG/5ML
177.0000 mg/m2 | Freq: Once | INTRAVENOUS | Status: AC
  Administered 2016-02-01: 320 mg via INTRAVENOUS
  Filled 2016-02-01: qty 5.33

## 2016-02-01 NOTE — Progress Notes (Signed)
Oncology Nurse Navigator Documentation  Oncology Nurse Navigator Flowsheets 02/01/2016  Navigator Location [facility]  Navigator Encounter Type Treatment  Telephone -  Treatment Initiated Date -  Patient Visit Type MedOnc  Treatment Phase Active Tx-FOLFIRI & Avastin  Barriers/Navigation Needs No barriers at this time;No Questions;No Needs  Education -  Interventions Other--supportive listening and encouragement to focus on what brings her joy in life.  Referrals -  Education Method -  Support Groups/Services -  Specialty Items/DME -  [employer] -  Time Spent with Patient 15

## 2016-02-01 NOTE — Progress Notes (Signed)
[facility] OFFICE PROGRESS NOTE   Diagnosis:  Colon cancer  INTERVAL HISTORY:   Martha Becker returns as scheduled. She completed cycle 6 FOLFIRI/Avastin 01/11/2016. She subsequently contacted the office to request 3 weeks between chemotherapy cycles. She denies nausea/vomiting. No mouth sores. No diarrhea. She did have constipation which resolved with a stool softener. At the base of 2 toes on the left foot she developed "blisters". No significant bone pain following Neulasta. She has intermittent "achy" pain posterior left leg. She wonders if the pain is due to being active. No leg swelling. She denies back pain. No leg weakness or numbness. No bowel or bladder dysfunction.  Objective:  Vital signs in last 24 hours:  Blood pressure 124/75, pulse 71, temperature 97.8 F (36.6 C), temperature source Oral, resp. rate 20, height '5\' 6"'$  (1.676 m), weight 154 lb 1.6 oz (69.899 kg), SpO2 100 %.    HEENT: No thrush or ulcers. Resp: Lungs clear bilaterally. Cardio: Regular rate and rhythm. GI: Abdomen soft and nontender. No hepatomegaly. Vascular: No leg edema. Neuro: Lower extremity motor strength 5 over 5. Knee DTRs 2+, symmetric. Skin: Dry appearing skin at the base of the second and third toes on the left foot.  Port-A-Cath without erythema.  Lab Results:  Lab Results  Component Value Date   WBC 6.4 02/01/2016   HGB 12.7 02/01/2016   HCT 38.6 02/01/2016   MCV 93.3 02/01/2016   PLT 222 02/01/2016   NEUTROABS 4.0 02/01/2016    Imaging:  No results found.  Medications: I have reviewed the patient's current medications.  Assessment/Plan: 1. 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, status post a right colectomy 08/31/2014  no loss of mismatch repair protein expression, microsatellite stable  K-ras wild-type by standard testing,NRAS Q61K mutation identified on Foundation 1 testing  APC alteration  detected  Staging CT scans December 2015 and PET scan 09/22/2014 consistent with metastatic bilateral lung nodules, metastatic retroperitoneal lymphadenopathy, and a probable left liver metastasis  Cycle 1 CAPOX 10/29/2014 (Xeloda discontinued day 9 secondary to diarrhea)   CT 11/14/2014 revealed progressive metastatic disease at the lung bases and liver compared to a PET scan from 09/22/2014, mild improvement in retroperitoneal lymphadenopathy  Cycle 1 FOLFOX 12/02/2014  Cycle 2 FOLFOX, Avastin added 12/16/2014  Cycle 3 FOLFOX/Avastin 12/30/2014  Cycle 4 FOLFOX/Avastin 01/13/2015  Cycle 5 FOLFOX/Avastin 01/27/2015  CT 02/10/2015 with improvement in hepatic metastases, mild improvement of lung metastases and retroperitoneal lymphadenopathy  Cycle 6 FOLFOX/Avastin 02/16/2015  Cycle 7 FOLFOX/Avastin 03/02/2015  Cycle 8 FOLFOX/Avastin 03/16/2015  Cycle 9 FOLFOX/Avastin 04/13/2015  Restaging CTs 04/20/2015 with a decrease in the size of pulmonary nodules, liver lesions, and retroperitoneal lymph nodes  Restaging CTs 07/27/2015 revealed enlargement of bilateral lung nodules, stable liver lesions, decreased periaortic adenopathy  CTs 10/05/2015 revealed further enlargement of bilateral lung nodules ,enlargement of liver lesions, and enlargement of peritoneal lymph nodes  Cycle 1 FOLFIRI/Avastin 10/12/2015  Cycle 2 FOLFIRI/Avastin 10/26/2015  Cycle 3 FOLFIRI/Avastin 11/09/2015  Cycle 4 FOLFIRI/Avastin 11/23/2015  Cycle 5 FOLFIRI/Avastin 12/28/2015  CTs 01/06/2016- decreased size of lung nodules , decreased / regression of liver lesions, regression of retroperitoneal nodes  Cycle 6 FOLFIRI/Avastin 01/11/2016 (Neulasta added)   Cycle 7 FOLFIRI/Avastin 02/01/2016 with Neulasta support  2. Elevated TSH, PET scan 09/22/2014 with diffuse increased metabolic uptake in the thyroid   3. Family history of multiple cancers including colon cancer in her paternal grandfather  and rectal cancer in a paternal first cousin  4. Admission 11/14/2014 with nausea and diarrhea, most likely secondary to capecitabine induced enteritis-resolved. CT 11/14/2014 consistent with ileitis  5.Superficial phlebitis left hand/wrist 12/30/2014.  6. Early oxaliplatin neuropathy  7. Pain at the right gluteus and right leg-MRI of the lumbar spine 07/13/2015 confirmed a disc protrusion at L5-S1  8. Diaphoresis, rhinorrhea, shortness of breath and cough following cycle 1 FOLFIRI/Avastin, did not occur following cycle 2  9. Oral mucositis secondary to chemotherapy  10. Nonproductive cough-most likely secondary to colon cancer; improved 11/23/2015  11. Neutropenia secondary to chemotherapy -Neulasta added with cycle 6 FOLFIRI 01/11/2016    Disposition: Martha Becker appears stable. She has completed 6 cycles of FOLFIRI/Avastin. Plan to proceed with cycle 7 today as scheduled. She will return for a follow-up visit and cycle 8 in 3 weeks. She will contact the office in the interim with any problems. We specifically discussed persistent left leg pain, edema or other worrisome symptoms.  Plan reviewed with Dr. Benay Spice. 25 minutes were spent face-to-face at today's visit with the majority of that time involved in counseling/coordination of care.    Ned Card ANP/GNP-BC   02/01/2016  11:48 AM

## 2016-02-01 NOTE — Patient Instructions (Signed)

## 2016-02-01 NOTE — Patient Instructions (Signed)
[facility] Discharge Instructions for Patients Receiving Chemotherapy  Today you received the following chemotherapy agents Avastin, Leucovorin, Irinotecan and Fluorouracil.   To help prevent nausea and vomiting after your treatment, we encourage you to take your nausea medication as directed.   If you develop nausea and vomiting that is not controlled by your nausea medication, call the clinic.   BELOW ARE SYMPTOMS THAT SHOULD BE REPORTED IMMEDIATELY:  *FEVER GREATER THAN 100.5 F  *CHILLS WITH OR WITHOUT FEVER  NAUSEA AND VOMITING THAT IS NOT CONTROLLED WITH YOUR NAUSEA MEDICATION  *UNUSUAL SHORTNESS OF BREATH  *UNUSUAL BRUISING OR BLEEDING  TENDERNESS IN MOUTH AND THROAT WITH OR WITHOUT PRESENCE OF ULCERS  *URINARY PROBLEMS  *BOWEL PROBLEMS  UNUSUAL RASH Items with * indicate a potential emergency and should be followed up as soon as possible.  Feel free to call the clinic you have any questions or concerns. The clinic phone number is [phone].  Please show the CHEMO ALERT CARD at check-in to the Emergency Department and triage nurse.    

## 2016-02-02 ENCOUNTER — Other Ambulatory Visit: Payer: Self-pay | Admitting: *Deleted

## 2016-02-02 ENCOUNTER — Telehealth: Payer: Self-pay | Admitting: Oncology

## 2016-02-02 LAB — CEA: CEA: 16.1 ng/mL — ABNORMAL HIGH (ref 0.0–4.7)

## 2016-02-02 NOTE — Telephone Encounter (Signed)
Gave and pritned appt sched and avs for pt for June °

## 2016-02-03 ENCOUNTER — Other Ambulatory Visit: Payer: Self-pay | Admitting: *Deleted

## 2016-02-03 ENCOUNTER — Ambulatory Visit: Payer: 59

## 2016-02-03 ENCOUNTER — Ambulatory Visit (HOSPITAL_BASED_OUTPATIENT_CLINIC_OR_DEPARTMENT_OTHER): Payer: 59

## 2016-02-03 VITALS — BP 124/78 | HR 66 | Temp 98.2°F | Resp 17

## 2016-02-03 DIAGNOSIS — C7802 Secondary malignant neoplasm of left lung: Secondary | ICD-10-CM

## 2016-02-03 DIAGNOSIS — C182 Malignant neoplasm of ascending colon: Secondary | ICD-10-CM

## 2016-02-03 DIAGNOSIS — C787 Secondary malignant neoplasm of liver and intrahepatic bile duct: Secondary | ICD-10-CM

## 2016-02-03 DIAGNOSIS — C7801 Secondary malignant neoplasm of right lung: Secondary | ICD-10-CM | POA: Diagnosis not present

## 2016-02-03 DIAGNOSIS — D701 Agranulocytosis secondary to cancer chemotherapy: Secondary | ICD-10-CM

## 2016-02-03 DIAGNOSIS — C180 Malignant neoplasm of cecum: Secondary | ICD-10-CM

## 2016-02-03 MED ORDER — HEPARIN SOD (PORK) LOCK FLUSH 100 UNIT/ML IV SOLN
500.0000 [IU] | Freq: Once | INTRAVENOUS | Status: AC | PRN
  Administered 2016-02-03: 500 [IU]
  Filled 2016-02-03: qty 5

## 2016-02-03 MED ORDER — SODIUM CHLORIDE 0.9% FLUSH
10.0000 mL | INTRAVENOUS | Status: DC | PRN
  Administered 2016-02-03: 10 mL
  Filled 2016-02-03: qty 10

## 2016-02-03 MED ORDER — PEGFILGRASTIM INJECTION 6 MG/0.6ML ~~LOC~~
6.0000 mg | PREFILLED_SYRINGE | Freq: Once | SUBCUTANEOUS | Status: AC
  Administered 2016-02-03: 6 mg via SUBCUTANEOUS
  Filled 2016-02-03: qty 0.6

## 2016-02-03 NOTE — Progress Notes (Signed)
Neulasta given in infusion with pump d/c

## 2016-02-03 NOTE — Progress Notes (Signed)
Pt called requesting that her appts be set up until the end of July. She requests Tuesday and Thursday appts.  [facility] is closed on 03/14/16, and pt requests her treatment on  Monday, 03/13/16 of that week.  POF sent.

## 2016-02-08 ENCOUNTER — Other Ambulatory Visit: Payer: 59

## 2016-02-08 ENCOUNTER — Ambulatory Visit: Payer: 59

## 2016-02-08 ENCOUNTER — Ambulatory Visit: Payer: 59 | Admitting: Oncology

## 2016-02-08 ENCOUNTER — Telehealth: Payer: Self-pay | Admitting: *Deleted

## 2016-02-08 NOTE — Telephone Encounter (Signed)
Oncology Nurse Navigator Documentation  Oncology Nurse Navigator Flowsheets 02/08/2016  Navigator Location [facility]  Navigator Encounter Type Telephone  Telephone Incoming Call;FMLA/Disability  Treatment Initiated Date -  Patient Visit Type -  Treatment Phase Active Tx  Barriers/Navigation Needs Family concerns-verbalized her desire to live a long life and fears that she won't  Education Coping with Diagnosis/ Prognosis;Concerns with Insurance Coverage--her medical leave expires 03/10/16  Interventions Other--notified Dr. Benay Spice that she needs a letter dictated to Leron Croak (HR dept) that she will still be in treatment and needs her leave extended. Fax [phone].  Referrals -  Education Method -  Support Groups/Services -  Specialty Items/DME -  Acuity -  Time Spent with Patient 15  Supportive listening and attempted to refocus her on the good and positive in front of her now instead of wasting time & energy worrying. Reassured her that she is responding to treatment and she is not at the end of her options if this treatment begins to stop working. Again reminded her that no human can predict how long someone will live with complete accuracy.

## 2016-02-10 ENCOUNTER — Encounter: Payer: Self-pay | Admitting: *Deleted

## 2016-02-10 ENCOUNTER — Ambulatory Visit: Payer: 59

## 2016-02-10 ENCOUNTER — Encounter: Payer: Self-pay | Admitting: Oncology

## 2016-02-10 NOTE — Progress Notes (Signed)
  Oncology Nurse Navigator Documentation  Navigator Location: [facility] (02/10/16 [id number]) Navigator Encounter Type: Letter/Fax/Email (02/10/16 [id number]) Telephone: FMLA/Disability (02/10/16 [id number])  Faxed letter MD dictated today to [place] department att: Leron Croak at [phone]. Will provide Adesola a copy at next appointment.

## 2016-02-20 ENCOUNTER — Other Ambulatory Visit: Payer: Self-pay | Admitting: Oncology

## 2016-02-22 ENCOUNTER — Ambulatory Visit (HOSPITAL_BASED_OUTPATIENT_CLINIC_OR_DEPARTMENT_OTHER): Payer: 59 | Admitting: Nurse Practitioner

## 2016-02-22 ENCOUNTER — Other Ambulatory Visit (HOSPITAL_BASED_OUTPATIENT_CLINIC_OR_DEPARTMENT_OTHER): Payer: 59

## 2016-02-22 ENCOUNTER — Ambulatory Visit (HOSPITAL_BASED_OUTPATIENT_CLINIC_OR_DEPARTMENT_OTHER): Payer: 59

## 2016-02-22 VITALS — BP 129/74 | HR 63 | Temp 97.9°F | Resp 17 | Ht 66.0 in | Wt 152.4 lb

## 2016-02-22 VITALS — BP 114/74 | HR 54 | Resp 17

## 2016-02-22 DIAGNOSIS — C180 Malignant neoplasm of cecum: Secondary | ICD-10-CM

## 2016-02-22 DIAGNOSIS — Z5112 Encounter for antineoplastic immunotherapy: Secondary | ICD-10-CM

## 2016-02-22 DIAGNOSIS — C182 Malignant neoplasm of ascending colon: Secondary | ICD-10-CM

## 2016-02-22 DIAGNOSIS — C7800 Secondary malignant neoplasm of unspecified lung: Secondary | ICD-10-CM

## 2016-02-22 DIAGNOSIS — C787 Secondary malignant neoplasm of liver and intrahepatic bile duct: Secondary | ICD-10-CM

## 2016-02-22 DIAGNOSIS — C7801 Secondary malignant neoplasm of right lung: Secondary | ICD-10-CM | POA: Diagnosis not present

## 2016-02-22 DIAGNOSIS — Z8672 Personal history of thrombophlebitis: Secondary | ICD-10-CM

## 2016-02-22 DIAGNOSIS — C786 Secondary malignant neoplasm of retroperitoneum and peritoneum: Secondary | ICD-10-CM

## 2016-02-22 DIAGNOSIS — C7802 Secondary malignant neoplasm of left lung: Secondary | ICD-10-CM

## 2016-02-22 DIAGNOSIS — G620 Drug-induced polyneuropathy: Secondary | ICD-10-CM

## 2016-02-22 DIAGNOSIS — Z5111 Encounter for antineoplastic chemotherapy: Secondary | ICD-10-CM

## 2016-02-22 DIAGNOSIS — C801 Malignant (primary) neoplasm, unspecified: Secondary | ICD-10-CM

## 2016-02-22 DIAGNOSIS — D701 Agranulocytosis secondary to cancer chemotherapy: Secondary | ICD-10-CM

## 2016-02-22 DIAGNOSIS — K1231 Oral mucositis (ulcerative) due to antineoplastic therapy: Secondary | ICD-10-CM

## 2016-02-22 DIAGNOSIS — Z800 Family history of malignant neoplasm of digestive organs: Secondary | ICD-10-CM

## 2016-02-22 LAB — COMPREHENSIVE METABOLIC PANEL
ALBUMIN: 4 g/dL (ref 3.5–5.0)
ALK PHOS: 71 U/L (ref 40–150)
ALT: 18 U/L (ref 0–55)
ANION GAP: 6 meq/L (ref 3–11)
AST: 19 U/L (ref 5–34)
BILIRUBIN TOTAL: 0.53 mg/dL (ref 0.20–1.20)
BUN: 6.7 mg/dL — ABNORMAL LOW (ref 7.0–26.0)
CALCIUM: 9.6 mg/dL (ref 8.4–10.4)
CO2: 26 meq/L (ref 22–29)
CREATININE: 0.8 mg/dL (ref 0.6–1.1)
Chloride: 103 mEq/L (ref 98–109)
EGFR: 79 mL/min/{1.73_m2} — AB (ref >90)
Glucose: 94 mg/dl (ref 70–140)
Potassium: 4.2 mEq/L (ref 3.5–5.1)
Sodium: 134 mEq/L — ABNORMAL LOW (ref 136–145)
TOTAL PROTEIN: 6.8 g/dL (ref 6.4–8.3)

## 2016-02-22 LAB — CBC WITH DIFFERENTIAL/PLATELET
BASO%: 1 % (ref 0.0–2.0)
BASOS ABS: 0.1 10*3/uL (ref 0.0–0.1)
EOS%: 4.4 % (ref 0.0–7.0)
Eosinophils Absolute: 0.3 10*3/uL (ref 0.0–0.5)
HCT: 38.8 % (ref 34.8–46.6)
HGB: 13.1 g/dL (ref 11.6–15.9)
LYMPH%: 24.4 % (ref 14.0–49.7)
MCH: 31.8 pg (ref 25.1–34.0)
MCHC: 33.7 g/dL (ref 31.5–36.0)
MCV: 94.4 fL (ref 79.5–101.0)
MONO#: 0.7 10*3/uL (ref 0.1–0.9)
MONO%: 11 % (ref 0.0–14.0)
NEUT#: 3.6 10*3/uL (ref 1.5–6.5)
NEUT%: 59.2 % (ref 38.4–76.8)
Platelets: 211 10*3/uL (ref 145–400)
RBC: 4.11 10*6/uL (ref 3.70–5.45)
RDW: 16.4 % — ABNORMAL HIGH (ref 11.2–14.5)
WBC: 6 10*3/uL (ref 3.9–10.3)
lymph#: 1.5 10*3/uL (ref 0.9–3.3)

## 2016-02-22 LAB — UA PROTEIN, DIPSTICK - CHCC: PROTEIN: NEGATIVE mg/dL

## 2016-02-22 MED ORDER — PALONOSETRON HCL INJECTION 0.25 MG/5ML
0.2500 mg | Freq: Once | INTRAVENOUS | Status: AC
  Administered 2016-02-22: 0.25 mg via INTRAVENOUS

## 2016-02-22 MED ORDER — SODIUM CHLORIDE 0.9 % IV SOLN
Freq: Once | INTRAVENOUS | Status: AC
  Administered 2016-02-22: 14:00:00 via INTRAVENOUS
  Filled 2016-02-22: qty 5

## 2016-02-22 MED ORDER — ATROPINE SULFATE 1 MG/ML IJ SOLN
0.5000 mg | Freq: Once | INTRAMUSCULAR | Status: AC | PRN
  Administered 2016-02-22: 0.5 mg via INTRAVENOUS

## 2016-02-22 MED ORDER — IRINOTECAN HCL CHEMO INJECTION 100 MG/5ML
177.0000 mg/m2 | Freq: Once | INTRAVENOUS | Status: AC
  Administered 2016-02-22: 320 mg via INTRAVENOUS
  Filled 2016-02-22: qty 12

## 2016-02-22 MED ORDER — FAMOTIDINE IN NACL 20-0.9 MG/50ML-% IV SOLN
20.0000 mg | Freq: Two times a day (BID) | INTRAVENOUS | Status: DC
  Administered 2016-02-22: 20 mg via INTRAVENOUS

## 2016-02-22 MED ORDER — ATROPINE SULFATE 1 MG/ML IJ SOLN
INTRAMUSCULAR | Status: AC
  Filled 2016-02-22: qty 1

## 2016-02-22 MED ORDER — SODIUM CHLORIDE 0.9% FLUSH
10.0000 mL | INTRAVENOUS | Status: DC | PRN
  Filled 2016-02-22: qty 10

## 2016-02-22 MED ORDER — DIPHENHYDRAMINE HCL 50 MG/ML IJ SOLN
25.0000 mg | Freq: Once | INTRAMUSCULAR | Status: AC
  Administered 2016-02-22: 25 mg via INTRAVENOUS

## 2016-02-22 MED ORDER — PALONOSETRON HCL INJECTION 0.25 MG/5ML
INTRAVENOUS | Status: AC
  Filled 2016-02-22: qty 5

## 2016-02-22 MED ORDER — SODIUM CHLORIDE 0.9 % IV SOLN
1800.0000 mg/m2 | INTRAVENOUS | Status: DC
  Administered 2016-02-22: 3250 mg via INTRAVENOUS
  Filled 2016-02-22: qty 65

## 2016-02-22 MED ORDER — FAMOTIDINE IN NACL 20-0.9 MG/50ML-% IV SOLN
INTRAVENOUS | Status: AC
  Filled 2016-02-22: qty 50

## 2016-02-22 MED ORDER — HEPARIN SOD (PORK) LOCK FLUSH 100 UNIT/ML IV SOLN
500.0000 [IU] | Freq: Once | INTRAVENOUS | Status: DC | PRN
  Filled 2016-02-22: qty 5

## 2016-02-22 MED ORDER — DIPHENHYDRAMINE HCL 50 MG/ML IJ SOLN
INTRAMUSCULAR | Status: AC
  Filled 2016-02-22: qty 1

## 2016-02-22 MED ORDER — SODIUM CHLORIDE 0.9 % IV SOLN
Freq: Once | INTRAVENOUS | Status: AC
  Administered 2016-02-22: 13:00:00 via INTRAVENOUS

## 2016-02-22 MED ORDER — LEUCOVORIN CALCIUM INJECTION 350 MG
300.0000 mg/m2 | Freq: Once | INTRAVENOUS | Status: AC
  Administered 2016-02-22: 544 mg via INTRAVENOUS
  Filled 2016-02-22: qty 27.2

## 2016-02-22 MED ORDER — FLUOROURACIL CHEMO INJECTION 2.5 GM/50ML
300.0000 mg/m2 | Freq: Once | INTRAVENOUS | Status: AC
  Administered 2016-02-22: 550 mg via INTRAVENOUS
  Filled 2016-02-22: qty 11

## 2016-02-22 MED ORDER — SODIUM CHLORIDE 0.9 % IV SOLN
5.0000 mg/kg | Freq: Once | INTRAVENOUS | Status: AC
  Administered 2016-02-22: 325 mg via INTRAVENOUS
  Filled 2016-02-22: qty 13

## 2016-02-22 NOTE — Progress Notes (Addendum)
[facility] OFFICE PROGRESS NOTE   Diagnosis:  Colon cancer  INTERVAL HISTORY:   Martha Becker returns as scheduled. She completed cycle 7 FOLFIRI/Avastin 02/01/2016. She denies nausea/vomiting. She noted a "roughness" at the top of her mouth. This has resolved. She had loose stools on day 2. The loose stools resolved quickly. No hand or foot pain or redness. She continues to have an intermittent cough. No shortness of breath. No bleeding.  Objective:  Vital signs in last 24 hours:  Blood pressure 129/74, pulse 63, temperature 97.9 F (36.6 C), temperature source Oral, resp. rate 17, height 5' 6"  (1.676 m), weight 152 lb 6.4 oz (69.128 kg), SpO2 100 %.    HEENT: No thrush or ulcers. Resp: Lungs clear bilaterally. Cardio: Regular rate and rhythm. GI: Abdomen soft and nontender. No hepatomegaly. Vascular: No leg edema. Skin: Palms without erythema. Port-A-Cath without erythema.    Lab Results:  Lab Results  Component Value Date   WBC 6.0 02/22/2016   HGB 13.1 02/22/2016   HCT 38.8 02/22/2016   MCV 94.4 02/22/2016   PLT 211 02/22/2016   NEUTROABS 3.6 02/22/2016    Imaging:  No results found.  Medications: I have reviewed the patient's current medications.  Assessment/Plan: 1. 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, status post a right colectomy 08/31/2014  no loss of mismatch repair protein expression, microsatellite stable  K-ras wild-type by standard testing,NRAS Q61K mutation identified on Foundation 1 testing  APC alteration detected  Staging CT scans December 2015 and PET scan 09/22/2014 consistent with metastatic bilateral lung nodules, metastatic retroperitoneal lymphadenopathy, and a probable left liver metastasis  Cycle 1 CAPOX 10/29/2014 (Xeloda discontinued day 9 secondary to diarrhea)   CT 11/14/2014 revealed progressive metastatic disease at the lung bases and liver compared to a  PET scan from 09/22/2014, mild improvement in retroperitoneal lymphadenopathy  Cycle 1 FOLFOX 12/02/2014  Cycle 2 FOLFOX, Avastin added 12/16/2014  Cycle 3 FOLFOX/Avastin 12/30/2014  Cycle 4 FOLFOX/Avastin 01/13/2015  Cycle 5 FOLFOX/Avastin 01/27/2015  CT 02/10/2015 with improvement in hepatic metastases, mild improvement of lung metastases and retroperitoneal lymphadenopathy  Cycle 6 FOLFOX/Avastin 02/16/2015  Cycle 7 FOLFOX/Avastin 03/02/2015  Cycle 8 FOLFOX/Avastin 03/16/2015  Cycle 9 FOLFOX/Avastin 04/13/2015  Restaging CTs 04/20/2015 with a decrease in the size of pulmonary nodules, liver lesions, and retroperitoneal lymph nodes  Restaging CTs 07/27/2015 revealed enlargement of bilateral lung nodules, stable liver lesions, decreased periaortic adenopathy  CTs 10/05/2015 revealed further enlargement of bilateral lung nodules ,enlargement of liver lesions, and enlargement of peritoneal lymph nodes  Cycle 1 FOLFIRI/Avastin 10/12/2015  Cycle 2 FOLFIRI/Avastin 10/26/2015  Cycle 3 FOLFIRI/Avastin 11/09/2015  Cycle 4 FOLFIRI/Avastin 11/23/2015  Cycle 5 FOLFIRI/Avastin 12/28/2015  CTs 01/06/2016- decreased size of lung nodules , decreased / regression of liver lesions, regression of retroperitoneal nodes  Cycle 6 FOLFIRI/Avastin 01/11/2016 (Neulasta added)   Cycle 7 FOLFIRI/Avastin 02/01/2016 with Neulasta support  Cycle 8 FOLFIRI/Avastin 02/22/2016 with Neulasta support  2. Elevated TSH, PET scan 09/22/2014 with diffuse increased metabolic uptake in the thyroid   3. Family history of multiple cancers including colon cancer in her paternal grandfather and rectal cancer in a paternal first cousin  7. Admission 11/14/2014 with nausea and diarrhea, most likely secondary to capecitabine induced enteritis-resolved. CT 11/14/2014 consistent with ileitis  5.Superficial phlebitis left hand/wrist 12/30/2014.  6. Early oxaliplatin neuropathy  7. Pain  at the right gluteus and right leg-MRI of the lumbar spine 07/13/2015 confirmed a disc protrusion at L5-S1  8. Diaphoresis, rhinorrhea, shortness of breath and cough following cycle 1 FOLFIRI/Avastin, did not occur following cycle 2  9. Oral mucositis secondary to chemotherapy  10. Nonproductive cough-most likely secondary to colon cancer; improved 11/23/2015  11. Neutropenia secondary to chemotherapy -Neulasta added with cycle 6 FOLFIRI 01/11/2016    Disposition: Martha Becker appears stable. She has completed 7 cycles of FOLFIRI/Avastin. Plan to proceed with cycle 8 today as scheduled. She will return for a follow-up visit and cycle 9 in 3 weeks. She will contact the office in the interim with any problems.  Patient seen with Dr. Benay Spice.    Ned Card ANP/GNP-BC   02/22/2016  12:23 PM  This was a shared visit with Ned Card. Martha Becker continues FOLFIRI/Avastin. The plan is to schedule a restaging evaluation after 10 cycles of chemotherapy.  Martha Becker, M.D.

## 2016-02-22 NOTE — Patient Instructions (Signed)
[facility] Discharge Instructions for Patients Receiving Chemotherapy  Today you received the following chemotherapy agents Avastin, Leucovorin, Irinotecan and Fluorouracil.   To help prevent nausea and vomiting after your treatment, we encourage you to take your nausea medication as directed.   If you develop nausea and vomiting that is not controlled by your nausea medication, call the clinic.   BELOW ARE SYMPTOMS THAT SHOULD BE REPORTED IMMEDIATELY:  *FEVER GREATER THAN 100.5 F  *CHILLS WITH OR WITHOUT FEVER  NAUSEA AND VOMITING THAT IS NOT CONTROLLED WITH YOUR NAUSEA MEDICATION  *UNUSUAL SHORTNESS OF BREATH  *UNUSUAL BRUISING OR BLEEDING  TENDERNESS IN MOUTH AND THROAT WITH OR WITHOUT PRESENCE OF ULCERS  *URINARY PROBLEMS  *BOWEL PROBLEMS  UNUSUAL RASH Items with * indicate a potential emergency and should be followed up as soon as possible.  Feel free to call the clinic you have any questions or concerns. The clinic phone number is [phone].  Please show the CHEMO ALERT CARD at check-in to the Emergency Department and triage nurse.    

## 2016-02-24 ENCOUNTER — Ambulatory Visit (HOSPITAL_BASED_OUTPATIENT_CLINIC_OR_DEPARTMENT_OTHER): Payer: 59

## 2016-02-24 ENCOUNTER — Ambulatory Visit: Payer: 59

## 2016-02-24 VITALS — BP 110/80 | HR 64 | Temp 98.0°F | Resp 16

## 2016-02-24 DIAGNOSIS — C787 Secondary malignant neoplasm of liver and intrahepatic bile duct: Secondary | ICD-10-CM

## 2016-02-24 DIAGNOSIS — C7801 Secondary malignant neoplasm of right lung: Secondary | ICD-10-CM

## 2016-02-24 DIAGNOSIS — C182 Malignant neoplasm of ascending colon: Secondary | ICD-10-CM | POA: Diagnosis not present

## 2016-02-24 DIAGNOSIS — C7802 Secondary malignant neoplasm of left lung: Secondary | ICD-10-CM

## 2016-02-24 DIAGNOSIS — Z5189 Encounter for other specified aftercare: Secondary | ICD-10-CM

## 2016-02-24 MED ORDER — HEPARIN SOD (PORK) LOCK FLUSH 100 UNIT/ML IV SOLN
500.0000 [IU] | Freq: Once | INTRAVENOUS | Status: AC | PRN
Start: 2016-02-24 — End: 2016-02-24
  Administered 2016-02-24: 500 [IU]
  Filled 2016-02-24: qty 5

## 2016-02-24 MED ORDER — SODIUM CHLORIDE 0.9% FLUSH
10.0000 mL | INTRAVENOUS | Status: DC | PRN
  Administered 2016-02-24: 10 mL
  Filled 2016-02-24: qty 10

## 2016-02-24 MED ORDER — PEGFILGRASTIM INJECTION 6 MG/0.6ML ~~LOC~~
6.0000 mg | PREFILLED_SYRINGE | Freq: Once | SUBCUTANEOUS | Status: AC
  Administered 2016-02-24: 6 mg via SUBCUTANEOUS
  Filled 2016-02-24: qty 0.6

## 2016-02-24 NOTE — Patient Instructions (Signed)
Pegfilgrastim injection What is this medicine? PEGFILGRASTIM (PEG fil gra stim) is a long-acting granulocyte colony-stimulating factor that stimulates the growth of neutrophils, a type of white blood cell important in the body's fight against infection. It is used to reduce the incidence of fever and infection in patients with certain types of cancer who are receiving chemotherapy that affects the bone marrow, and to increase survival after being exposed to high doses of radiation. This medicine may be used for other purposes; ask your health care provider or pharmacist if you have questions. What should I tell my health care provider before I take this medicine? They need to know if you have any of these conditions: -kidney disease -latex allergy -ongoing radiation therapy -sickle cell disease -skin reactions to acrylic adhesives (On-Body Injector only) -an unusual or allergic reaction to pegfilgrastim, filgrastim, other medicines, foods, dyes, or preservatives -pregnant or trying to get pregnant -breast-feeding How should I use this medicine? This medicine is for injection under the skin. If you get this medicine at home, you will be taught how to prepare and give the pre-filled syringe or how to use the On-body Injector. Refer to the patient Instructions for Use for detailed instructions. Use exactly as directed. Take your medicine at regular intervals. Do not take your medicine more often than directed. It is important that you put your used needles and syringes in a special sharps container. Do not put them in a trash can. If you do not have a sharps container, call your pharmacist or healthcare provider to get one. Talk to your pediatrician regarding the use of this medicine in children. While this drug may be prescribed for selected conditions, precautions do apply. Overdosage: If you think you have taken too much of this medicine contact a poison control center or emergency room at  once. NOTE: This medicine is only for you. Do not share this medicine with others. What if I miss a dose? It is important not to miss your dose. Call your doctor or health care professional if you miss your dose. If you miss a dose due to an On-body Injector failure or leakage, a new dose should be administered as soon as possible using a single prefilled syringe for manual use. What may interact with this medicine? Interactions have not been studied. Give your health care provider a list of all the medicines, herbs, non-prescription drugs, or dietary supplements you use. Also tell them if you smoke, drink alcohol, or use illegal drugs. Some items may interact with your medicine. This list may not describe all possible interactions. Give your health care provider a list of all the medicines, herbs, non-prescription drugs, or dietary supplements you use. Also tell them if you smoke, drink alcohol, or use illegal drugs. Some items may interact with your medicine. What should I watch for while using this medicine? You may need blood work done while you are taking this medicine. If you are going to need a MRI, CT scan, or other procedure, tell your doctor that you are using this medicine (On-Body Injector only). What side effects may I notice from receiving this medicine? Side effects that you should report to your doctor or health care professional as soon as possible: -allergic reactions like skin rash, itching or hives, swelling of the face, lips, or tongue -dizziness -fever -pain, redness, or irritation at site where injected -pinpoint red spots on the skin -red or dark-brown urine -shortness of breath or breathing problems -stomach or side pain, or pain   at the shoulder -swelling -tiredness -trouble passing urine or change in the amount of urine Side effects that usually do not require medical attention (report to your doctor or health care professional if they continue or are  bothersome): -bone pain -muscle pain This list may not describe all possible side effects. Call your doctor for medical advice about side effects. You may report side effects to FDA at 1-800-FDA-1088. Where should I keep my medicine? Keep out of the reach of children. Store pre-filled syringes in a refrigerator between 2 and 8 degrees C (36 and 46 degrees F). Do not freeze. Keep in carton to protect from light. Throw away this medicine if it is left out of the refrigerator for more than 48 hours. Throw away any unused medicine after the expiration date. NOTE: This sheet is a summary. It may not cover all possible information. If you have questions about this medicine, talk to your doctor, pharmacist, or health care provider.    2016, Elsevier/Gold Standard. (2014-09-17 14:30:14)  

## 2016-02-24 NOTE — Progress Notes (Signed)
TO be completed in flush Room

## 2016-02-28 ENCOUNTER — Telehealth: Payer: Self-pay | Admitting: *Deleted

## 2016-02-28 NOTE — Telephone Encounter (Signed)
  Oncology Nurse Navigator Documentation  Navigator Location: [facility] (02/28/16 1410) Navigator Encounter Type: Telephone (02/28/16 1410) Telephone: Incoming Call;Outgoing Call;FMLA/Disability (02/28/16 1410)   Martha Becker called to inquire if office has heard anything from her HR department at [employer]? Called back and left VM that our office has not had any contact with her HR department. Letter was faxed on 6/1 with confirmation received per fax machine. Suggested she call HR and follow up if there is any other information needed.

## 2016-03-07 ENCOUNTER — Encounter: Payer: Self-pay | Admitting: Oncology

## 2016-03-07 NOTE — Progress Notes (Signed)
I left mess for patient to advise is her employer needs anything from [place] they can fax it to me- I left fax#. She would need to ck with them.

## 2016-03-13 ENCOUNTER — Ambulatory Visit: Payer: 59

## 2016-03-13 ENCOUNTER — Ambulatory Visit (HOSPITAL_BASED_OUTPATIENT_CLINIC_OR_DEPARTMENT_OTHER): Payer: 59

## 2016-03-13 ENCOUNTER — Other Ambulatory Visit: Payer: Self-pay | Admitting: Hematology

## 2016-03-13 ENCOUNTER — Ambulatory Visit (HOSPITAL_BASED_OUTPATIENT_CLINIC_OR_DEPARTMENT_OTHER): Payer: 59 | Admitting: Nurse Practitioner

## 2016-03-13 ENCOUNTER — Other Ambulatory Visit (HOSPITAL_BASED_OUTPATIENT_CLINIC_OR_DEPARTMENT_OTHER): Payer: 59

## 2016-03-13 VITALS — BP 101/74

## 2016-03-13 VITALS — BP 136/85 | HR 76 | Temp 98.1°F | Resp 18 | Ht 66.0 in | Wt 150.2 lb

## 2016-03-13 DIAGNOSIS — G620 Drug-induced polyneuropathy: Secondary | ICD-10-CM

## 2016-03-13 DIAGNOSIS — C801 Malignant (primary) neoplasm, unspecified: Secondary | ICD-10-CM

## 2016-03-13 DIAGNOSIS — Z95828 Presence of other vascular implants and grafts: Secondary | ICD-10-CM

## 2016-03-13 DIAGNOSIS — C786 Secondary malignant neoplasm of retroperitoneum and peritoneum: Secondary | ICD-10-CM

## 2016-03-13 DIAGNOSIS — C182 Malignant neoplasm of ascending colon: Secondary | ICD-10-CM

## 2016-03-13 DIAGNOSIS — C787 Secondary malignant neoplasm of liver and intrahepatic bile duct: Secondary | ICD-10-CM

## 2016-03-13 DIAGNOSIS — Z8672 Personal history of thrombophlebitis: Secondary | ICD-10-CM

## 2016-03-13 DIAGNOSIS — C7802 Secondary malignant neoplasm of left lung: Secondary | ICD-10-CM

## 2016-03-13 DIAGNOSIS — C180 Malignant neoplasm of cecum: Secondary | ICD-10-CM

## 2016-03-13 DIAGNOSIS — C7801 Secondary malignant neoplasm of right lung: Secondary | ICD-10-CM

## 2016-03-13 DIAGNOSIS — Z5112 Encounter for antineoplastic immunotherapy: Secondary | ICD-10-CM | POA: Diagnosis not present

## 2016-03-13 DIAGNOSIS — Z5111 Encounter for antineoplastic chemotherapy: Secondary | ICD-10-CM

## 2016-03-13 DIAGNOSIS — K1231 Oral mucositis (ulcerative) due to antineoplastic therapy: Secondary | ICD-10-CM

## 2016-03-13 DIAGNOSIS — C7800 Secondary malignant neoplasm of unspecified lung: Secondary | ICD-10-CM

## 2016-03-13 DIAGNOSIS — D701 Agranulocytosis secondary to cancer chemotherapy: Secondary | ICD-10-CM

## 2016-03-13 LAB — CBC WITH DIFFERENTIAL/PLATELET
BASO%: 0.4 % (ref 0.0–2.0)
BASOS ABS: 0 10*3/uL (ref 0.0–0.1)
EOS ABS: 0.4 10*3/uL (ref 0.0–0.5)
EOS%: 4.9 % (ref 0.0–7.0)
HEMATOCRIT: 38.6 % (ref 34.8–46.6)
HEMOGLOBIN: 13.4 g/dL (ref 11.6–15.9)
LYMPH#: 1.8 10*3/uL (ref 0.9–3.3)
LYMPH%: 22 % (ref 14.0–49.7)
MCH: 31.5 pg (ref 25.1–34.0)
MCHC: 34.7 g/dL (ref 31.5–36.0)
MCV: 90.8 fL (ref 79.5–101.0)
MONO#: 0.7 10*3/uL (ref 0.1–0.9)
MONO%: 8.3 % (ref 0.0–14.0)
NEUT#: 5.3 10*3/uL (ref 1.5–6.5)
NEUT%: 64.4 % (ref 38.4–76.8)
NRBC: 0 % (ref 0–0)
PLATELETS: 227 10*3/uL (ref 145–400)
RBC: 4.25 10*6/uL (ref 3.70–5.45)
RDW: 15.8 % — AB (ref 11.2–14.5)
WBC: 8.2 10*3/uL (ref 3.9–10.3)

## 2016-03-13 LAB — COMPREHENSIVE METABOLIC PANEL
ALBUMIN: 4 g/dL (ref 3.5–5.0)
ALK PHOS: 86 U/L (ref 40–150)
ALT: 19 U/L (ref 0–55)
ANION GAP: 9 meq/L (ref 3–11)
AST: 20 U/L (ref 5–34)
BILIRUBIN TOTAL: 0.41 mg/dL (ref 0.20–1.20)
BUN: 11.1 mg/dL (ref 7.0–26.0)
CO2: 25 mEq/L (ref 22–29)
CREATININE: 0.9 mg/dL (ref 0.6–1.1)
Calcium: 10.1 mg/dL (ref 8.4–10.4)
Chloride: 101 mEq/L (ref 98–109)
EGFR: 74 mL/min/{1.73_m2} — AB (ref >90)
Glucose: 92 mg/dl (ref 70–140)
Potassium: 3.8 mEq/L (ref 3.5–5.1)
Sodium: 135 mEq/L — ABNORMAL LOW (ref 136–145)
TOTAL PROTEIN: 7.1 g/dL (ref 6.4–8.3)

## 2016-03-13 LAB — UA PROTEIN, DIPSTICK - CHCC: Protein, ur: NEGATIVE mg/dL

## 2016-03-13 MED ORDER — PALONOSETRON HCL INJECTION 0.25 MG/5ML
0.2500 mg | Freq: Once | INTRAVENOUS | Status: AC
  Administered 2016-03-13: 0.25 mg via INTRAVENOUS

## 2016-03-13 MED ORDER — FAMOTIDINE IN NACL 20-0.9 MG/50ML-% IV SOLN
INTRAVENOUS | Status: AC
  Filled 2016-03-13: qty 50

## 2016-03-13 MED ORDER — SODIUM CHLORIDE 0.9% FLUSH
10.0000 mL | INTRAVENOUS | Status: DC | PRN
  Filled 2016-03-13: qty 10

## 2016-03-13 MED ORDER — FLUOROURACIL CHEMO INJECTION 5 GM/100ML
1800.0000 mg/m2 | INTRAVENOUS | Status: DC
  Administered 2016-03-13: 3250 mg via INTRAVENOUS
  Filled 2016-03-13: qty 65

## 2016-03-13 MED ORDER — SODIUM CHLORIDE 0.9 % IV SOLN
Freq: Once | INTRAVENOUS | Status: AC
  Administered 2016-03-13: 12:00:00 via INTRAVENOUS

## 2016-03-13 MED ORDER — IRINOTECAN HCL CHEMO INJECTION 100 MG/5ML
177.0000 mg/m2 | Freq: Once | INTRAVENOUS | Status: AC
  Administered 2016-03-13: 320 mg via INTRAVENOUS
  Filled 2016-03-13: qty 12

## 2016-03-13 MED ORDER — PALONOSETRON HCL INJECTION 0.25 MG/5ML
INTRAVENOUS | Status: AC
  Filled 2016-03-13: qty 5

## 2016-03-13 MED ORDER — DIPHENHYDRAMINE HCL 50 MG/ML IJ SOLN
INTRAMUSCULAR | Status: AC
  Filled 2016-03-13: qty 1

## 2016-03-13 MED ORDER — FAMOTIDINE IN NACL 20-0.9 MG/50ML-% IV SOLN
20.0000 mg | Freq: Two times a day (BID) | INTRAVENOUS | Status: DC
  Administered 2016-03-13: 20 mg via INTRAVENOUS

## 2016-03-13 MED ORDER — SODIUM CHLORIDE 0.9 % IJ SOLN
10.0000 mL | INTRAMUSCULAR | Status: AC | PRN
  Administered 2016-03-13: 10 mL
  Filled 2016-03-13: qty 10

## 2016-03-13 MED ORDER — DIPHENHYDRAMINE HCL 50 MG/ML IJ SOLN
25.0000 mg | Freq: Once | INTRAMUSCULAR | Status: AC
  Administered 2016-03-13: 25 mg via INTRAVENOUS

## 2016-03-13 MED ORDER — ATROPINE SULFATE 1 MG/ML IJ SOLN
0.5000 mg | Freq: Once | INTRAMUSCULAR | Status: AC | PRN
  Administered 2016-03-13: 0.5 mg via INTRAVENOUS

## 2016-03-13 MED ORDER — DEXTROSE 5 % IV SOLN
300.0000 mg/m2 | Freq: Once | INTRAVENOUS | Status: AC
  Administered 2016-03-13: 544 mg via INTRAVENOUS
  Filled 2016-03-13: qty 27.2

## 2016-03-13 MED ORDER — FOSAPREPITANT DIMEGLUMINE INJECTION 150 MG
Freq: Once | INTRAVENOUS | Status: AC
  Administered 2016-03-13: 12:00:00 via INTRAVENOUS
  Filled 2016-03-13: qty 5

## 2016-03-13 MED ORDER — FLUOROURACIL CHEMO INJECTION 2.5 GM/50ML
300.0000 mg/m2 | Freq: Once | INTRAVENOUS | Status: AC
  Administered 2016-03-13: 550 mg via INTRAVENOUS
  Filled 2016-03-13: qty 11

## 2016-03-13 MED ORDER — ATROPINE SULFATE 1 MG/ML IJ SOLN
INTRAMUSCULAR | Status: AC
  Filled 2016-03-13: qty 1

## 2016-03-13 MED ORDER — HEPARIN SOD (PORK) LOCK FLUSH 100 UNIT/ML IV SOLN
500.0000 [IU] | Freq: Once | INTRAVENOUS | Status: DC | PRN
  Filled 2016-03-13: qty 5

## 2016-03-13 MED ORDER — SODIUM CHLORIDE 0.9 % IV SOLN
5.0000 mg/kg | Freq: Once | INTRAVENOUS | Status: AC
  Administered 2016-03-13: 325 mg via INTRAVENOUS
  Filled 2016-03-13: qty 13

## 2016-03-13 NOTE — Patient Instructions (Signed)
[facility] Discharge Instructions for Patients Receiving Chemotherapy  Today you received the following chemotherapy agents Avastin, Leucovorin, Irinotecan and Fluorouracil.   To help prevent nausea and vomiting after your treatment, we encourage you to take your nausea medication as directed.   If you develop nausea and vomiting that is not controlled by your nausea medication, call the clinic.   BELOW ARE SYMPTOMS THAT SHOULD BE REPORTED IMMEDIATELY:  *FEVER GREATER THAN 100.5 F  *CHILLS WITH OR WITHOUT FEVER  NAUSEA AND VOMITING THAT IS NOT CONTROLLED WITH YOUR NAUSEA MEDICATION  *UNUSUAL SHORTNESS OF BREATH  *UNUSUAL BRUISING OR BLEEDING  TENDERNESS IN MOUTH AND THROAT WITH OR WITHOUT PRESENCE OF ULCERS  *URINARY PROBLEMS  *BOWEL PROBLEMS  UNUSUAL RASH Items with * indicate a potential emergency and should be followed up as soon as possible.  Feel free to call the clinic you have any questions or concerns. The clinic phone number is [phone].  Please show the CHEMO ALERT CARD at check-in to the Emergency Department and triage nurse.    

## 2016-03-13 NOTE — Patient Instructions (Signed)

## 2016-03-13 NOTE — Progress Notes (Signed)
[facility] OFFICE PROGRESS NOTE   Diagnosis:  Colon cancer  INTERVAL HISTORY:   Martha Becker returns as scheduled. She completed cycle 8 FOLFIRI/Avastin 02/22/2016. She tends to have mild nausea for the first 2-3 days. Hold medications are effective. No mouth sores. No significant diarrhea. No bleeding except occasional small amount of blood when she blows her nose. No shortness of breath or chest pain.  Objective:  Vital signs in last 24 hours:  Blood pressure 136/85, pulse 76, temperature 98.1 F (36.7 C), temperature source Oral, resp. rate 18, height 5\' 6"  (1.676 m), weight 150 lb 3.2 oz (68.13 kg), SpO2 99 %.    HEENT: No thrush or ulcers. Resp: Lungs clear bilaterally. Cardio: Regular rate and rhythm. GI: Abdomen soft and nontender. No hepatomegaly. No mass. Vascular: No leg edema. Calves soft and nontender.  Skin: No rash. Port-A-Cath without erythema.    Lab Results:  Lab Results  Component Value Date   WBC 8.2 03/13/2016   HGB 13.4 03/13/2016   HCT 38.6 03/13/2016   MCV 90.8 03/13/2016   PLT 227 03/13/2016   NEUTROABS 5.3 03/13/2016    Imaging:  No results found.  Medications: I have reviewed the patient's current medications.  Assessment/Plan: 1. 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, status post a right colectomy 08/31/2014  no loss of mismatch repair protein expression, microsatellite stable  K-ras wild-type by standard testing,NRAS Q61K mutation identified on Foundation 1 testing  APC alteration detected  Staging CT scans December 2015 and PET scan 09/22/2014 consistent with metastatic bilateral lung nodules, metastatic retroperitoneal lymphadenopathy, and a probable left liver metastasis  Cycle 1 CAPOX 10/29/2014 (Xeloda discontinued day 9 secondary to diarrhea)   CT 11/14/2014 revealed progressive metastatic disease at the lung bases and liver compared to a PET scan from  09/22/2014, mild improvement in retroperitoneal lymphadenopathy  Cycle 1 FOLFOX 12/02/2014  Cycle 2 FOLFOX, Avastin added 12/16/2014  Cycle 3 FOLFOX/Avastin 12/30/2014  Cycle 4 FOLFOX/Avastin 01/13/2015  Cycle 5 FOLFOX/Avastin 01/27/2015  CT 02/10/2015 with improvement in hepatic metastases, mild improvement of lung metastases and retroperitoneal lymphadenopathy  Cycle 6 FOLFOX/Avastin 02/16/2015  Cycle 7 FOLFOX/Avastin 03/02/2015  Cycle 8 FOLFOX/Avastin 03/16/2015  Cycle 9 FOLFOX/Avastin 04/13/2015  Restaging CTs 04/20/2015 with a decrease in the size of pulmonary nodules, liver lesions, and retroperitoneal lymph nodes  Restaging CTs 07/27/2015 revealed enlargement of bilateral lung nodules, stable liver lesions, decreased periaortic adenopathy  CTs 10/05/2015 revealed further enlargement of bilateral lung nodules ,enlargement of liver lesions, and enlargement of peritoneal lymph nodes  Cycle 1 FOLFIRI/Avastin 10/12/2015  Cycle 2 FOLFIRI/Avastin 10/26/2015  Cycle 3 FOLFIRI/Avastin 11/09/2015  Cycle 4 FOLFIRI/Avastin 11/23/2015  Cycle 5 FOLFIRI/Avastin 12/28/2015  CTs 01/06/2016- decreased size of lung nodules , decreased / regression of liver lesions, regression of retroperitoneal nodes  Cycle 6 FOLFIRI/Avastin 01/11/2016 (Neulasta added)   Cycle 7 FOLFIRI/Avastin 02/01/2016 with Neulasta support  Cycle 8 FOLFIRI/Avastin 02/22/2016 with Neulasta support  Cycle 9 FOLFIRI/Avastin 03/13/2016 with Neulasta support  2. Elevated TSH, PET scan 09/22/2014 with diffuse increased metabolic uptake in the thyroid   3. Family history of multiple cancers including colon cancer in her paternal grandfather and rectal cancer in a paternal first cousin  4. Admission 11/14/2014 with nausea and diarrhea, most likely secondary to capecitabine induced enteritis-resolved. CT 11/14/2014 consistent with ileitis  5.Superficial phlebitis left hand/wrist  12/30/2014.  6. Early oxaliplatin neuropathy  7. Pain at the right gluteus and right leg-MRI of the lumbar spine  07/13/2015 confirmed a disc protrusion at L5-S1  8. Diaphoresis, rhinorrhea, shortness of breath and cough following cycle 1 FOLFIRI/Avastin, did not occur following cycle 2  9. Oral mucositis secondary to chemotherapy  10. Nonproductive cough-most likely secondary to colon cancer; improved 11/23/2015  11. Neutropenia secondary to chemotherapy -Neulasta added with cycle 6 FOLFIRI 01/11/2016    Disposition: Martha Becker appears stable. She has completed 8 cycles of FOLFIRI/Avastin. Plan to proceed with cycle 9 today as scheduled. She will return for a follow-up visit and cycle 10 in 3 weeks.   The plan is for restaging CT scans after completion of 10 cycles.  She will contact the office prior to her next visit with any problems.    Ned Card ANP/GNP-BC   03/13/2016  11:35 AM

## 2016-03-14 LAB — CEA: CEA1: 17.7 ng/mL — AB (ref 0.0–4.7)

## 2016-03-15 ENCOUNTER — Ambulatory Visit (HOSPITAL_BASED_OUTPATIENT_CLINIC_OR_DEPARTMENT_OTHER): Payer: 59

## 2016-03-15 ENCOUNTER — Ambulatory Visit: Payer: 59

## 2016-03-15 VITALS — BP 126/73 | HR 57 | Temp 98.8°F | Resp 18

## 2016-03-15 DIAGNOSIS — C182 Malignant neoplasm of ascending colon: Secondary | ICD-10-CM

## 2016-03-15 DIAGNOSIS — C7801 Secondary malignant neoplasm of right lung: Secondary | ICD-10-CM | POA: Diagnosis not present

## 2016-03-15 DIAGNOSIS — C7802 Secondary malignant neoplasm of left lung: Secondary | ICD-10-CM | POA: Diagnosis not present

## 2016-03-15 DIAGNOSIS — Z5189 Encounter for other specified aftercare: Secondary | ICD-10-CM

## 2016-03-15 DIAGNOSIS — C787 Secondary malignant neoplasm of liver and intrahepatic bile duct: Secondary | ICD-10-CM

## 2016-03-15 DIAGNOSIS — C180 Malignant neoplasm of cecum: Secondary | ICD-10-CM

## 2016-03-15 MED ORDER — HEPARIN SOD (PORK) LOCK FLUSH 100 UNIT/ML IV SOLN
500.0000 [IU] | Freq: Once | INTRAVENOUS | Status: AC | PRN
  Administered 2016-03-15: 500 [IU]
  Filled 2016-03-15: qty 5

## 2016-03-15 MED ORDER — SODIUM CHLORIDE 0.9% FLUSH
10.0000 mL | INTRAVENOUS | Status: DC | PRN
  Administered 2016-03-15: 10 mL
  Filled 2016-03-15: qty 10

## 2016-03-15 MED ORDER — PEGFILGRASTIM INJECTION 6 MG/0.6ML ~~LOC~~
6.0000 mg | PREFILLED_SYRINGE | Freq: Once | SUBCUTANEOUS | Status: AC
  Administered 2016-03-15: 6 mg via SUBCUTANEOUS
  Filled 2016-03-15: qty 0.6

## 2016-03-15 NOTE — Progress Notes (Signed)
Pt reports she does not feel good buts states it is " like normal " when she comes in for pump d/c. She is drinking and voiding , vss, she has nausea and is taking her nausea meds. She does not think she needs IVF. I instructed pt to call tomorrow if she feels worse, has vomiting and is unable to drink or has uncontrolled nausea

## 2016-03-15 NOTE — Patient Instructions (Signed)
[facility] Discharge Instructions for Patients Receiving Chemotherapy  Today you completed the following chemotherapy agents 67fu To help prevent nausea and vomiting after your treatment, we encourage you to take your nausea medication as prescribed,   If you develop nausea and vomiting that is not controlled by your nausea medication, call the clinic.   BELOW ARE SYMPTOMS THAT SHOULD BE REPORTED IMMEDIATELY:  *FEVER GREATER THAN 100.5 F  *CHILLS WITH OR WITHOUT FEVER  NAUSEA AND VOMITING THAT IS NOT CONTROLLED WITH YOUR NAUSEA MEDICATION  *UNUSUAL SHORTNESS OF BREATH  *UNUSUAL BRUISING OR BLEEDING  TENDERNESS IN MOUTH AND THROAT WITH OR WITHOUT PRESENCE OF ULCERS  *URINARY PROBLEMS  *BOWEL PROBLEMS  UNUSUAL RASH Items with * indicate a potential emergency and should be followed up as soon as possible.  Feel free to call the clinic you have any questions or concerns. The clinic phone number is [phone].  Please show the [place] at check-in to the Emergency Department and triage nurse.

## 2016-03-15 NOTE — Progress Notes (Signed)
Pt given injection in infusion room

## 2016-03-20 ENCOUNTER — Telehealth: Payer: Self-pay | Admitting: *Deleted

## 2016-03-20 NOTE — Telephone Encounter (Signed)
Message from pt requesting return call.  Spoke with pt, she reports Cycle 9 "hit me hard." Asks if dose was reduced based on her current weight. Informed her fatigue can be cumulative with this chemo. Weight was down 2 lbs compared to June, did not require dose adjustment. Pt denies diarrhea, reports one large watery stool Day 5 after chemo. Martha Becker wanted to make MD aware of fatigue in case he wants to decrease her Irinotecan dose.   Will make Dr. Benay Spice aware of pt's increasing fatigue.

## 2016-04-04 ENCOUNTER — Ambulatory Visit (HOSPITAL_BASED_OUTPATIENT_CLINIC_OR_DEPARTMENT_OTHER): Payer: 59 | Admitting: Oncology

## 2016-04-04 ENCOUNTER — Ambulatory Visit: Payer: 59

## 2016-04-04 ENCOUNTER — Encounter: Payer: Self-pay | Admitting: *Deleted

## 2016-04-04 ENCOUNTER — Other Ambulatory Visit (HOSPITAL_BASED_OUTPATIENT_CLINIC_OR_DEPARTMENT_OTHER): Payer: 59

## 2016-04-04 ENCOUNTER — Ambulatory Visit (HOSPITAL_BASED_OUTPATIENT_CLINIC_OR_DEPARTMENT_OTHER): Payer: 59

## 2016-04-04 VITALS — BP 123/81 | HR 77 | Temp 98.2°F | Resp 18 | Ht 66.0 in | Wt 149.4 lb

## 2016-04-04 DIAGNOSIS — Z5112 Encounter for antineoplastic immunotherapy: Secondary | ICD-10-CM | POA: Diagnosis not present

## 2016-04-04 DIAGNOSIS — G620 Drug-induced polyneuropathy: Secondary | ICD-10-CM

## 2016-04-04 DIAGNOSIS — Z5111 Encounter for antineoplastic chemotherapy: Secondary | ICD-10-CM | POA: Diagnosis not present

## 2016-04-04 DIAGNOSIS — C182 Malignant neoplasm of ascending colon: Secondary | ICD-10-CM

## 2016-04-04 DIAGNOSIS — C180 Malignant neoplasm of cecum: Secondary | ICD-10-CM

## 2016-04-04 DIAGNOSIS — D701 Agranulocytosis secondary to cancer chemotherapy: Secondary | ICD-10-CM

## 2016-04-04 DIAGNOSIS — R05 Cough: Secondary | ICD-10-CM

## 2016-04-04 DIAGNOSIS — C787 Secondary malignant neoplasm of liver and intrahepatic bile duct: Secondary | ICD-10-CM

## 2016-04-04 DIAGNOSIS — C7801 Secondary malignant neoplasm of right lung: Secondary | ICD-10-CM

## 2016-04-04 DIAGNOSIS — R970 Elevated carcinoembryonic antigen [CEA]: Secondary | ICD-10-CM

## 2016-04-04 DIAGNOSIS — K1231 Oral mucositis (ulcerative) due to antineoplastic therapy: Secondary | ICD-10-CM

## 2016-04-04 DIAGNOSIS — Z8672 Personal history of thrombophlebitis: Secondary | ICD-10-CM

## 2016-04-04 DIAGNOSIS — C7802 Secondary malignant neoplasm of left lung: Secondary | ICD-10-CM

## 2016-04-04 DIAGNOSIS — Z95828 Presence of other vascular implants and grafts: Secondary | ICD-10-CM

## 2016-04-04 LAB — COMPREHENSIVE METABOLIC PANEL
ALT: 17 U/L (ref 0–55)
ANION GAP: 9 meq/L (ref 3–11)
AST: 20 U/L (ref 5–34)
Albumin: 4.1 g/dL (ref 3.5–5.0)
Alkaline Phosphatase: 78 U/L (ref 40–150)
BILIRUBIN TOTAL: 0.66 mg/dL (ref 0.20–1.20)
BUN: 11.9 mg/dL (ref 7.0–26.0)
CALCIUM: 9.8 mg/dL (ref 8.4–10.4)
CHLORIDE: 104 meq/L (ref 98–109)
CO2: 23 meq/L (ref 22–29)
CREATININE: 0.8 mg/dL (ref 0.6–1.1)
EGFR: 78 mL/min/{1.73_m2} — ABNORMAL LOW (ref >90)
Glucose: 78 mg/dl (ref 70–140)
Potassium: 3.9 mEq/L (ref 3.5–5.1)
Sodium: 136 mEq/L (ref 136–145)
TOTAL PROTEIN: 7 g/dL (ref 6.4–8.3)

## 2016-04-04 LAB — CBC WITH DIFFERENTIAL/PLATELET
BASO%: 1 % (ref 0.0–2.0)
Basophils Absolute: 0.1 10*3/uL (ref 0.0–0.1)
EOS%: 5.7 % (ref 0.0–7.0)
Eosinophils Absolute: 0.5 10*3/uL (ref 0.0–0.5)
HEMATOCRIT: 39.7 % (ref 34.8–46.6)
HGB: 13.3 g/dL (ref 11.6–15.9)
LYMPH#: 1.8 10*3/uL (ref 0.9–3.3)
LYMPH%: 21.7 % (ref 14.0–49.7)
MCH: 31.1 pg (ref 25.1–34.0)
MCHC: 33.6 g/dL (ref 31.5–36.0)
MCV: 92.8 fL (ref 79.5–101.0)
MONO#: 0.8 10*3/uL (ref 0.1–0.9)
MONO%: 10.1 % (ref 0.0–14.0)
NEUT%: 61.5 % (ref 38.4–76.8)
NEUTROS ABS: 5.1 10*3/uL (ref 1.5–6.5)
PLATELETS: 221 10*3/uL (ref 145–400)
RBC: 4.28 10*6/uL (ref 3.70–5.45)
RDW: 16.2 % — AB (ref 11.2–14.5)
WBC: 8.3 10*3/uL (ref 3.9–10.3)

## 2016-04-04 LAB — UA PROTEIN, DIPSTICK - CHCC: Protein, ur: NEGATIVE mg/dL

## 2016-04-04 MED ORDER — FLUOROURACIL CHEMO INJECTION 2.5 GM/50ML
300.0000 mg/m2 | Freq: Once | INTRAVENOUS | Status: AC
  Administered 2016-04-04: 550 mg via INTRAVENOUS
  Filled 2016-04-04: qty 11

## 2016-04-04 MED ORDER — SODIUM CHLORIDE 0.9 % IV SOLN
Freq: Once | INTRAVENOUS | Status: AC
  Administered 2016-04-04: 12:00:00 via INTRAVENOUS

## 2016-04-04 MED ORDER — IRINOTECAN HCL CHEMO INJECTION 100 MG/5ML
177.0000 mg/m2 | Freq: Once | INTRAVENOUS | Status: AC
  Administered 2016-04-04: 320 mg via INTRAVENOUS
  Filled 2016-04-04: qty 1

## 2016-04-04 MED ORDER — PALONOSETRON HCL INJECTION 0.25 MG/5ML
0.2500 mg | Freq: Once | INTRAVENOUS | Status: AC
  Administered 2016-04-04: 0.25 mg via INTRAVENOUS

## 2016-04-04 MED ORDER — DIPHENHYDRAMINE HCL 50 MG/ML IJ SOLN
25.0000 mg | Freq: Once | INTRAMUSCULAR | Status: AC
  Administered 2016-04-04: 25 mg via INTRAVENOUS

## 2016-04-04 MED ORDER — BEVACIZUMAB CHEMO INJECTION 400 MG/16ML
5.0000 mg/kg | Freq: Once | INTRAVENOUS | Status: AC
  Administered 2016-04-04: 325 mg via INTRAVENOUS
  Filled 2016-04-04: qty 13

## 2016-04-04 MED ORDER — FAMOTIDINE IN NACL 20-0.9 MG/50ML-% IV SOLN
INTRAVENOUS | Status: AC
  Filled 2016-04-04: qty 50

## 2016-04-04 MED ORDER — DEXTROSE 5 % IV SOLN
300.0000 mg/m2 | Freq: Once | INTRAVENOUS | Status: AC
  Administered 2016-04-04: 544 mg via INTRAVENOUS
  Filled 2016-04-04: qty 27.2

## 2016-04-04 MED ORDER — DIPHENHYDRAMINE HCL 50 MG/ML IJ SOLN
INTRAMUSCULAR | Status: AC
  Filled 2016-04-04: qty 1

## 2016-04-04 MED ORDER — ATROPINE SULFATE 1 MG/ML IJ SOLN
INTRAMUSCULAR | Status: AC
  Filled 2016-04-04: qty 1

## 2016-04-04 MED ORDER — SODIUM CHLORIDE 0.9 % IV SOLN
1800.0000 mg/m2 | INTRAVENOUS | Status: DC
  Administered 2016-04-04: 3250 mg via INTRAVENOUS
  Filled 2016-04-04: qty 65

## 2016-04-04 MED ORDER — SODIUM CHLORIDE 0.9 % IV SOLN
Freq: Once | INTRAVENOUS | Status: AC
  Administered 2016-04-04: 14:00:00 via INTRAVENOUS
  Filled 2016-04-04: qty 5

## 2016-04-04 MED ORDER — PALONOSETRON HCL INJECTION 0.25 MG/5ML
INTRAVENOUS | Status: AC
  Filled 2016-04-04: qty 5

## 2016-04-04 MED ORDER — SODIUM CHLORIDE 0.9 % IJ SOLN
10.0000 mL | INTRAMUSCULAR | Status: DC | PRN
  Filled 2016-04-04: qty 10

## 2016-04-04 MED ORDER — FAMOTIDINE IN NACL 20-0.9 MG/50ML-% IV SOLN
20.0000 mg | Freq: Two times a day (BID) | INTRAVENOUS | Status: DC
  Administered 2016-04-04: 20 mg via INTRAVENOUS

## 2016-04-04 MED ORDER — ATROPINE SULFATE 1 MG/ML IJ SOLN
0.5000 mg | Freq: Once | INTRAMUSCULAR | Status: AC | PRN
  Administered 2016-04-04: 0.5 mg via INTRAVENOUS

## 2016-04-04 MED ORDER — SODIUM CHLORIDE 0.9 % IJ SOLN
10.0000 mL | INTRAMUSCULAR | Status: AC | PRN
  Administered 2016-04-04: 10 mL
  Filled 2016-04-04: qty 10

## 2016-04-04 NOTE — Patient Instructions (Signed)

## 2016-04-04 NOTE — Progress Notes (Signed)
[place] OFFICE PROGRESS NOTE   Diagnosis: Colon cancer  INTERVAL HISTORY:   [name] returns as scheduled. She completed another cycle of FOLFIRI/Avastin 03/13/2016. She reports increased malaise following this cycle of chemotherapy. She had nausea following chemotherapy. She now feels well. Her cough is not consistent. No bleeding or symptom of thrombosis. She is concerned the CEA was higher earlier this month.  Objective:  Vital signs in last 24 hours:  Blood pressure 123/81, pulse 77, temperature 98.2 F (36.8 C), temperature source Oral, resp. rate 18, height _0  (1.676 m), weight 149 lb 6.4 oz (67.8 kg), SpO2 100 %.    HEENT: No thrush or ulcers Resp: Lungs clear bilaterally Cardio: Regular rate and rhythm GI: No hepatomegaly, nontender Vascular: No leg edema  Skin: Mild hyperpigmentation of the hands   Portacath/PICC-without erythema  Lab Results:  Lab Results  Component Value Date   WBC 8.3 04/04/2016   HGB 13.3 04/04/2016   HCT 39.7 04/04/2016   MCV 92.8 04/04/2016   PLT 221 04/04/2016   NEUTROABS 5.1 04/04/2016     Lab Results  Component Value Date   CEA1 17.7 (H) 03/13/2016    Medications: I have reviewed the patient's current medications.  Assessment/Plan: . 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 no loss of mismatch repair protein expression, microsatellite stable ? 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 APC alteration detected ? Staging CT scans December 2015 and PET scan 09/22/2014 consistent with metastatic bilateral lung nodules, metastatic retroperitoneal lymphadenopathy, and a probable left liver metastasis ? Cycle 1 CAPOX 10/29/2014 (Xeloda discontinued day 9 secondary to diarrhea)  ? CT 11/14/2014 revealed progressive metastatic disease at the lung bases and liver  compared to a PET scan from 09/22/2014, mild improvement in retroperitoneal lymphadenopathy ? Cycle 1 FOLFOX 12/02/2014 ? Cycle 2 FOLFOX, Avastin added 12/16/2014 ? Cycle 3 FOLFOX/Avastin 12/30/2014 ? Cycle 4 FOLFOX/Avastin 01/13/2015 ? Cycle 5 FOLFOX/Avastin 01/27/2015  CT 02/10/2015 with improvement in hepatic metastases, mild improvement of lung metastases and retroperitoneal lymphadenopathy  Cycle 6 FOLFOX/Avastin 02/16/2015  Cycle 7 FOLFOX/Avastin 03/02/2015  Cycle 8 FOLFOX/Avastin 03/16/2015  Cycle 9 FOLFOX/Avastin 04/13/2015  Restaging CTs 04/20/2015 with a decrease in the size of pulmonary nodules, liver lesions, and retroperitoneal lymph nodes  Restaging CTs 07/27/2015 revealed enlargement of bilateral lung nodules, stable liver lesions, decreased periaortic adenopathy  CTs 10/05/2015 revealed further enlargement of bilateral lung nodules ,enlargement of liver lesions, and enlargement of peritoneal lymph nodes  Cycle 1 FOLFIRI/Avastin 10/12/2015  Cycle 2 FOLFIRI/Avastin 10/26/2015  Cycle 3 FOLFIRI/Avastin 11/09/2015  Cycle 4 FOLFIRI/Avastin 11/23/2015  Cycle 5 FOLFIRI/Avastin 12/28/2015  CTs 01/06/2016- decreased size of lung nodules , decreased / regression of liver lesions, regression of retroperitoneal nodes  Cycle 6 FOLFIRI/Avastin 01/11/2016 (Neulasta added)   Cycle 7 FOLFIRI/Avastin 02/01/2016 with Neulasta support  Cycle 8 FOLFIRI/Avastin 02/22/2016 with Neulasta support  Cycle 9 FOLFIRI/Avastin 03/13/2016 with Neulasta support  Cycle 10 FOLFIRI/Avastin 04/04/2016 with Neulasta support  2. Elevated TSH, PET scan 09/22/2014 with diffuse increased metabolic uptake in the thyroid   3. Family history of multiple cancers including colon cancer in her paternal grandfather and rectal cancer in a paternal first cousin  15. Admission 11/14/2014 with nausea and diarrhea, most likely secondary to capecitabine induced enteritis-resolved. CT 11/14/2014  consistent with ileitis  5.Superficial phlebitis left hand/wrist 12/30/2014.  6. Early oxaliplatin neuropathy  7. Pain at the  right gluteus and right leg-MRI of the lumbar spine 07/13/2015 confirmed a disc protrusion at L5-S1  8. Diaphoresis, rhinorrhea, shortness of breath and cough following cycle 1 FOLFIRI/Avastin, did not occur following cycle 2  9. Oral mucositis secondary to chemotherapy  10. Nonproductive cough-most likely secondary to colon cancer; improved 11/23/2015  11. Neutropenia secondary to chemotherapy -Neulasta added with cycle 6 FOLFIRI 01/11/2016    Disposition:  Martha Becker appears stable. She will complete another cycle of FOLFIRI/Avastin today. She will then be referred for restaging CT scans.  The CEA was higher on 03/13/2016. She understands this could be related to progression of colon cancer or another etiology. I am encouraged her cough remains improved. The CEA was also higher when the restaging CT on 01/06/2016 confirmed a response to the FOLFIRI/Avastin.  She will be scheduled for a restaging CT prior to an office visit on 04/19/2016.  Betsy Coder, MD  04/04/2016  11:26 AM

## 2016-04-04 NOTE — Patient Instructions (Signed)
[facility] Discharge Instructions for Patients Receiving Chemotherapy  Today you received the following chemotherapy agents:  Leucovorin, 5FU, irinotecan, Avastin  To help prevent nausea and vomiting after your treatment, we encourage you to take your nausea medication.   If you develop nausea and vomiting that is not controlled by your nausea medication, call the clinic.   BELOW ARE SYMPTOMS THAT SHOULD BE REPORTED IMMEDIATELY:  *FEVER GREATER THAN 100.5 F  *CHILLS WITH OR WITHOUT FEVER  NAUSEA AND VOMITING THAT IS NOT CONTROLLED WITH YOUR NAUSEA MEDICATION  *UNUSUAL SHORTNESS OF BREATH  *UNUSUAL BRUISING OR BLEEDING  TENDERNESS IN MOUTH AND THROAT WITH OR WITHOUT PRESENCE OF ULCERS  *URINARY PROBLEMS  *BOWEL PROBLEMS  UNUSUAL RASH Items with * indicate a potential emergency and should be followed up as soon as possible.  Feel free to call the clinic you have any questions or concerns. The clinic phone number is [phone].  Please show the [facility] at check-in to the Emergency Department and triage nurse.

## 2016-04-04 NOTE — Progress Notes (Signed)
Oncology Nurse Navigator Documentation  Oncology Nurse Navigator Flowsheets 04/04/2016  Navigator Location [facility]  Navigator Encounter Type Follow-up Appt  Telephone -  Treatment Initiated Date -  Patient Visit Type MedOnc  Treatment Phase Active Tx--FOLFIRI/Avastin every 3 weeks  Barriers/Navigation Needs Coordination of Care;Family concerns  Education Coping with Diagnosis/ Prognosis;Understanding Cancer-  Interventions Coordination of Care;Education Method--  Referrals -  Coordination of Care Radiology  Education Method Verbal  Support Groups/Services -  Specialty Items/DME -  Acuity -  Time Spent with Patient 30  Time Spent with Patient (Retired) -  Explained again that we can't predict how her cancer will grow--cancer cells can change their DNA anytime during replication. Reminded her that the fact she is not coughing and feels good is a good sign. She should not base her cancer status on the CEA marker only--will have CT scan in next couple weeks and regroup afterwards. She requests it be done on 8/8 around 11 am. She has to come in and drink the contrast in radiology (drinks the water based contrast). Sent message to managed care to get the PA done so it can be scheduled asap. Allowed her to vent her desires and anxiety, but reminded her to focus on the now and how good she feels now.

## 2016-04-05 LAB — CEA: CEA: 16.8 ng/mL — ABNORMAL HIGH (ref 0.0–4.7)

## 2016-04-06 ENCOUNTER — Ambulatory Visit (HOSPITAL_BASED_OUTPATIENT_CLINIC_OR_DEPARTMENT_OTHER): Payer: 59

## 2016-04-06 ENCOUNTER — Ambulatory Visit: Payer: 59

## 2016-04-06 VITALS — BP 93/51 | HR 67 | Temp 98.3°F | Resp 18

## 2016-04-06 DIAGNOSIS — C182 Malignant neoplasm of ascending colon: Secondary | ICD-10-CM | POA: Diagnosis not present

## 2016-04-06 DIAGNOSIS — C787 Secondary malignant neoplasm of liver and intrahepatic bile duct: Secondary | ICD-10-CM

## 2016-04-06 DIAGNOSIS — C7801 Secondary malignant neoplasm of right lung: Secondary | ICD-10-CM | POA: Diagnosis not present

## 2016-04-06 DIAGNOSIS — Z95828 Presence of other vascular implants and grafts: Secondary | ICD-10-CM

## 2016-04-06 DIAGNOSIS — C7802 Secondary malignant neoplasm of left lung: Secondary | ICD-10-CM

## 2016-04-06 DIAGNOSIS — Z5189 Encounter for other specified aftercare: Secondary | ICD-10-CM

## 2016-04-06 DIAGNOSIS — C180 Malignant neoplasm of cecum: Secondary | ICD-10-CM

## 2016-04-06 MED ORDER — SODIUM CHLORIDE 0.9 % IJ SOLN
10.0000 mL | INTRAMUSCULAR | Status: AC | PRN
  Administered 2016-04-06: 10 mL
  Filled 2016-04-06: qty 10

## 2016-04-06 MED ORDER — HEPARIN SOD (PORK) LOCK FLUSH 100 UNIT/ML IV SOLN
500.0000 [IU] | INTRAVENOUS | Status: AC | PRN
  Administered 2016-04-06: 500 [IU]
  Filled 2016-04-06: qty 5

## 2016-04-06 MED ORDER — PEGFILGRASTIM INJECTION 6 MG/0.6ML ~~LOC~~
6.0000 mg | PREFILLED_SYRINGE | Freq: Once | SUBCUTANEOUS | Status: AC
  Administered 2016-04-06: 6 mg via SUBCUTANEOUS
  Filled 2016-04-06: qty 0.6

## 2016-04-06 NOTE — Progress Notes (Signed)
Injection of Neulasta to be given un Flush Room #1

## 2016-04-06 NOTE — Addendum Note (Signed)
Addended by: Carlene Coria L on: 04/06/2016 02:38 PM   Modules accepted: Orders

## 2016-04-06 NOTE — Patient Instructions (Signed)

## 2016-04-07 ENCOUNTER — Telehealth: Payer: Self-pay | Admitting: *Deleted

## 2016-04-07 NOTE — Telephone Encounter (Signed)
Oncology Nurse Navigator Documentation  Oncology Nurse Navigator Flowsheets 04/07/2016  Navigator Location [facility]  Navigator Encounter Type Telephone  Telephone Outgoing Call;Appt Confirmation/Clarification  Treatment Initiated Date -  Patient Visit Type -  Treatment Phase -  Barriers/Navigation Needs -  Education -  Interventions Coordination of Care--rescheduled CT for [facility] at her request and sent a My Chart message with Central Scheduling # to call and change date if she wishes. They did not have a morning appointment on 8/8 as she requested.  Referrals -  Coordination of Care -  Education Method -  Support Groups/Services -  Specialty Items/DME -  Acuity -  Time Spent with Patient 15  Time Spent with Patient (Retired) -

## 2016-04-07 NOTE — Telephone Encounter (Signed)
Call received from pt stating that she felt dizzy and her BP dropped yesterday after receiving Neulasta injection.  Records from yesterday show that patients BP dropped from 123/81 to 93/51 after injection.  Patient states that this morning she remains dizzy and lightheaded and would like some direction from Dr. Benay Spice regarding her symptoms.  Per Dr. Benay Spice, pt is to push fluids and get up slowly from sitting or lying down position and to call with any worsening symptoms or concerns.  Patient informed of MD instructions and verbalized an understanding of all information.  Pt appreciative of call back and has no further questions or concerns at this time.

## 2016-04-10 ENCOUNTER — Telehealth: Payer: Self-pay | Admitting: *Deleted

## 2016-04-10 NOTE — Telephone Encounter (Signed)
Returned call to pt, she describes redness and inflammation "encircling my anus." Noticed this one week ago.  She reports the area seems to stay moist and burns intermittently. She has tried antibiotic ointment to area with no change in appearance or discomfort.  Pt is unsure if the irritation stems from diarrhea. She has had several loose stools after taking stool softeners for constipation. Recommended she clean and dry area then apply a diaper rash type barrier cream. She agreed to try this. Dr. Benay Spice made aware of above: Will assess at next visit.

## 2016-04-17 ENCOUNTER — Telehealth: Payer: Self-pay | Admitting: *Deleted

## 2016-04-17 NOTE — Telephone Encounter (Signed)
Call from pt reporting she used cornstarch on the irritation on her bottom, and it "dried right up." She will follow up as scheduled.

## 2016-04-18 ENCOUNTER — Ambulatory Visit (HOSPITAL_COMMUNITY)
Admission: RE | Admit: 2016-04-18 | Discharge: 2016-04-18 | Disposition: A | Discharge status: Home / Self Care | Payer: 59 | Source: Ambulatory Visit | Attending: Oncology | Admitting: Oncology

## 2016-04-18 ENCOUNTER — Ambulatory Visit (HOSPITAL_COMMUNITY): Payer: 59

## 2016-04-18 DIAGNOSIS — C7802 Secondary malignant neoplasm of left lung: Secondary | ICD-10-CM | POA: Insufficient documentation

## 2016-04-18 DIAGNOSIS — C7801 Secondary malignant neoplasm of right lung: Secondary | ICD-10-CM | POA: Insufficient documentation

## 2016-04-18 DIAGNOSIS — C180 Malignant neoplasm of cecum: Secondary | ICD-10-CM | POA: Diagnosis present

## 2016-04-18 MED ORDER — DIATRIZOATE MEGLUMINE & SODIUM 66-10 % PO SOLN
ORAL | Status: AC
  Filled 2016-04-18: qty 30

## 2016-04-18 MED ORDER — HEPARIN SOD (PORK) LOCK FLUSH 100 UNIT/ML IV SOLN
INTRAVENOUS | Status: AC
  Filled 2016-04-18: qty 5

## 2016-04-18 MED ORDER — IOPAMIDOL (ISOVUE-300) INJECTION 61%
100.0000 mL | Freq: Once | INTRAVENOUS | Status: AC | PRN
  Administered 2016-04-18: 100 mL via INTRAVENOUS

## 2016-04-19 ENCOUNTER — Encounter: Payer: Self-pay | Admitting: *Deleted

## 2016-04-19 ENCOUNTER — Ambulatory Visit (HOSPITAL_BASED_OUTPATIENT_CLINIC_OR_DEPARTMENT_OTHER): Payer: 59 | Admitting: Oncology

## 2016-04-19 VITALS — BP 127/83 | HR 79 | Temp 98.3°F | Resp 18 | Ht 66.0 in | Wt 148.6 lb

## 2016-04-19 DIAGNOSIS — C7801 Secondary malignant neoplasm of right lung: Secondary | ICD-10-CM | POA: Diagnosis not present

## 2016-04-19 DIAGNOSIS — C7802 Secondary malignant neoplasm of left lung: Secondary | ICD-10-CM

## 2016-04-19 DIAGNOSIS — C182 Malignant neoplasm of ascending colon: Secondary | ICD-10-CM | POA: Diagnosis not present

## 2016-04-19 DIAGNOSIS — D701 Agranulocytosis secondary to cancer chemotherapy: Secondary | ICD-10-CM

## 2016-04-19 DIAGNOSIS — C787 Secondary malignant neoplasm of liver and intrahepatic bile duct: Secondary | ICD-10-CM | POA: Diagnosis not present

## 2016-04-19 DIAGNOSIS — Z8672 Personal history of thrombophlebitis: Secondary | ICD-10-CM

## 2016-04-19 DIAGNOSIS — C180 Malignant neoplasm of cecum: Secondary | ICD-10-CM

## 2016-04-19 DIAGNOSIS — G620 Drug-induced polyneuropathy: Secondary | ICD-10-CM

## 2016-04-19 DIAGNOSIS — K1231 Oral mucositis (ulcerative) due to antineoplastic therapy: Secondary | ICD-10-CM

## 2016-04-19 NOTE — Progress Notes (Signed)
[place] OFFICE PROGRESS NOTE   Diagnosis: Colon cancer  INTERVAL HISTORY:   Ms. Tugman returns as scheduled. She completed another cycle of FOLFIRI/Avastin on 04/04/2016. She reports increased diarrhea following this cycle of chemotherapy. She had mild soreness at the left side of the tongue. This has resolved. The cough is improved. No bleeding or symptom of thrombosis.  Objective:  Vital signs in last 24 hours:  Blood pressure 127/83, pulse 79, temperature 98.2 F (36.8 C), temperature source Oral, resp. rate 18, height _0  (1.676 m), weight 148 lb 9.6 oz (67.4 kg), SpO2 100 %.    HEENT: No thrush or ulcers Lymphatics: No cervical, supraclavicular, axillary, or inguinal nodes Resp: Lungs clear bilaterally Cardio: Regular rate and rhythm GI: No hepatosplenomegaly, no mass, nontender Vascular: No leg edema   Portacath/PICC-without erythema  Lab Results:  Lab Results  Component Value Date   WBC 8.3 04/04/2016   HGB 13.3 04/04/2016   HCT 39.7 04/04/2016   MCV 92.8 04/04/2016   PLT 221 04/04/2016   NEUTROABS 5.1 04/04/2016    Lab Results  Component Value Date   CEA1 16.8 (H) 04/04/2016    Imaging:  Ct Chest W Contrast  Result Date: 04/18/2016 CLINICAL DATA:  Restaging cecal cancer diagnosed in December 2015 with metastatic disease to the liver. Previous resection and chemotherapy. No current complaints. EXAM: CT CHEST, ABDOMEN, AND PELVIS WITH CONTRAST TECHNIQUE: Multidetector CT imaging of the chest, abdomen and pelvis was performed following the standard protocol during bolus administration of intravenous contrast. CONTRAST:  165m ISOVUE-300 IOPAMIDOL (ISOVUE-300) INJECTION 61% COMPARISON:  CT 01/06/2016 and 10/05/2015 FINDINGS: CT CHEST FINDINGS Cardiovascular: There are no significant vascular findings. Right subclavian Port-A-Cath tip extends to the lower SVC. The heart size is normal. There is no significant pericardial effusion.  Mediastinum/Nodes: There are no enlarged mediastinal, hilar or axillary lymph nodes. The thyroid gland, trachea and esophagus demonstrate no significant findings. Lungs/Pleura: There is no pleural effusion. There has been continued slow improvement in the numerous cavitary pulmonary nodules and masses bilaterally. There are more than 20 pulmonary nodules bilaterally. The previously referenced left lower lobe nodule measures 2.9 x 1.3 cm on image 94 (previously 3.4 x 1.8 cm). No definite enlarging nodules, endobronchial lesion or confluent airspace opacity. Musculoskeletal/Chest wall: No chest wall mass or suspicious osseous findings. CT ABDOMEN AND PELVIS FINDINGS Hepatobiliary: The dominant hypovascular lesion in segment to demonstrates continued improvement, measuring 2.1 x 1.9 cm on image 51 of series 2 (previously 2.4 x 1.9 cm). This lesion demonstrates residual peripheral enhancement versus calcification. No other definite residual suspicious lesions are identified. There are scattered well-circumscribed low-density hepatic lesions which are stable and likely cysts. No evidence of gallstones, gallbladder wall thickening or biliary dilatation. Pancreas: Unremarkable. No pancreatic ductal dilatation or surrounding inflammatory changes. Spleen: Normal in size without focal abnormality. Adrenals/Urinary Tract: Both adrenal glands appear normal. The kidneys appear normal without evidence of urinary tract calculus, suspicious lesion or hydronephrosis. No bladder abnormalities are seen. Stomach/Bowel: No evidence of bowel wall thickening, distention or surrounding inflammatory change. Stable postsurgical changes status post right hemicolectomy. Vascular/Lymphatic: Stable small residual retroperitoneal lymph nodes, measuring up to 7 mm on image 71. These appear partially calcified. No progressive adenopathy. No significant vascular findings are present. Reproductive: The uterus and ovaries appear normal. No evidence of  adnexal mass. Other: No ascites or peritoneal nodularity. Musculoskeletal: No acute or significant osseous findings. IMPRESSION: 1. Continued slow improvement in the multifocal metastatic disease throughout both lungs.  2. Continued slow regression in the dominant residual metastasis within segment 2 of the liver. No other definite residual hepatic disease. 3. No evidence of disease progression or acute findings. Electronically Signed   By: Richardean Sale M.D.   On: 04/18/2016 17:00   Ct Abdomen Pelvis W Contrast  Result Date: 04/18/2016 CLINICAL DATA:  Restaging cecal cancer diagnosed in December 2015 with metastatic disease to the liver. Previous resection and chemotherapy. No current complaints. EXAM: CT CHEST, ABDOMEN, AND PELVIS WITH CONTRAST TECHNIQUE: Multidetector CT imaging of the chest, abdomen and pelvis was performed following the standard protocol during bolus administration of intravenous contrast. CONTRAST:  175m ISOVUE-300 IOPAMIDOL (ISOVUE-300) INJECTION 61% COMPARISON:  CT 01/06/2016 and 10/05/2015 FINDINGS: CT CHEST FINDINGS Cardiovascular: There are no significant vascular findings. Right subclavian Port-A-Cath tip extends to the lower SVC. The heart size is normal. There is no significant pericardial effusion. Mediastinum/Nodes: There are no enlarged mediastinal, hilar or axillary lymph nodes. The thyroid gland, trachea and esophagus demonstrate no significant findings. Lungs/Pleura: There is no pleural effusion. There has been continued slow improvement in the numerous cavitary pulmonary nodules and masses bilaterally. There are more than 20 pulmonary nodules bilaterally. The previously referenced left lower lobe nodule measures 2.9 x 1.3 cm on image 94 (previously 3.4 x 1.8 cm). No definite enlarging nodules, endobronchial lesion or confluent airspace opacity. Musculoskeletal/Chest wall: No chest wall mass or suspicious osseous findings. CT ABDOMEN AND PELVIS FINDINGS Hepatobiliary: The  dominant hypovascular lesion in segment to demonstrates continued improvement, measuring 2.1 x 1.9 cm on image 51 of series 2 (previously 2.4 x 1.9 cm). This lesion demonstrates residual peripheral enhancement versus calcification. No other definite residual suspicious lesions are identified. There are scattered well-circumscribed low-density hepatic lesions which are stable and likely cysts. No evidence of gallstones, gallbladder wall thickening or biliary dilatation. Pancreas: Unremarkable. No pancreatic ductal dilatation or surrounding inflammatory changes. Spleen: Normal in size without focal abnormality. Adrenals/Urinary Tract: Both adrenal glands appear normal. The kidneys appear normal without evidence of urinary tract calculus, suspicious lesion or hydronephrosis. No bladder abnormalities are seen. Stomach/Bowel: No evidence of bowel wall thickening, distention or surrounding inflammatory change. Stable postsurgical changes status post right hemicolectomy. Vascular/Lymphatic: Stable small residual retroperitoneal lymph nodes, measuring up to 7 mm on image 71. These appear partially calcified. No progressive adenopathy. No significant vascular findings are present. Reproductive: The uterus and ovaries appear normal. No evidence of adnexal mass. Other: No ascites or peritoneal nodularity. Musculoskeletal: No acute or significant osseous findings. IMPRESSION: 1. Continued slow improvement in the multifocal metastatic disease throughout both lungs. 2. Continued slow regression in the dominant residual metastasis within segment 2 of the liver. No other definite residual hepatic disease. 3. No evidence of disease progression or acute findings. Electronically Signed   By: WRichardean SaleM.D.   On: 04/18/2016 17:00   Images reviewed Medications: I have reviewed the patient's current medications.  Assessment/Plan: 1. Stage IV (pT4b,pN1b,M1) moderately differentiated adenocarcinoma of the proximal ascending  colon, status post a right colectomy 08/31/2014 ? no loss of mismatch repair protein expression, microsatellite stable ? 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 APC alteration detected ? Staging CT scans December 2015 and PET scan 09/22/2014 consistent with metastatic bilateral lung nodules, metastatic retroperitoneal lymphadenopathy, and a probable left liver metastasis ? Cycle 1 CAPOX 10/29/2014 (Xeloda discontinued day 9 secondary to diarrhea)  ? CT 11/14/2014 revealed progressive metastatic disease at the lung bases and liver compared to  a PET scan from 09/22/2014, mild improvement in retroperitoneal lymphadenopathy ? Cycle 1 FOLFOX 12/02/2014 ? Cycle 2 FOLFOX, Avastin added 12/16/2014 ? Cycle 3 FOLFOX/Avastin 12/30/2014 ? Cycle 4 FOLFOX/Avastin 01/13/2015 ? Cycle 5 FOLFOX/Avastin 01/27/2015  CT 02/10/2015 with improvement in hepatic metastases, mild improvement of lung metastases and retroperitoneal lymphadenopathy  Cycle 6 FOLFOX/Avastin 02/16/2015  Cycle 7 FOLFOX/Avastin 03/02/2015  Cycle 8 FOLFOX/Avastin 03/16/2015  Cycle 9 FOLFOX/Avastin 04/13/2015  Restaging CTs 04/20/2015 with a decrease in the size of pulmonary nodules, liver lesions, and retroperitoneal lymph nodes  Restaging CTs 07/27/2015 revealed enlargement of bilateral lung nodules, stable liver lesions, decreased periaortic adenopathy  CTs 10/05/2015 revealed further enlargement of bilateral lung nodules ,enlargement of liver lesions, and enlargement of peritoneal lymph nodes  Cycle 1 FOLFIRI/Avastin 10/12/2015  Cycle 2 FOLFIRI/Avastin 10/26/2015  Cycle 3 FOLFIRI/Avastin 11/09/2015  Cycle 4 FOLFIRI/Avastin 11/23/2015  Cycle 5 FOLFIRI/Avastin 12/28/2015  CTs 01/06/2016-decreased size of lung nodules , decreased / regression of liver lesions, regression of retroperitoneal nodes  Cycle 6 FOLFIRI/Avastin 01/11/2016 (Neulasta added)   Cycle 7  FOLFIRI/Avastin 02/01/2016 with Neulasta support  Cycle 8 FOLFIRI/Avastin 02/22/2016 with Neulasta support  Cycle 9 FOLFIRI/Avastin 03/13/2016 with Neulasta support  Cycle 10 FOLFIRI/Avastin 04/04/2016 with Neulasta support  Restaging CTs 04/17/2016-decreased size of lung and liver lesions  Patient decided on treatment break  2. Elevated TSH, PET scan 09/22/2014 with diffuse increased metabolic uptake in the thyroid   3. Family history of multiple cancers including colon cancer in her paternal grandfather and rectal cancer in a paternal first cousin  5. Admission 11/14/2014 with nausea and diarrhea, most likely secondary to capecitabine induced enteritis-resolved. CT 11/14/2014 consistent with ileitis  5.Superficial phlebitis left hand/wrist 12/30/2014.  6. Early oxaliplatin neuropathy  7. Pain at the right gluteus and right leg-MRI of the lumbar spine 07/13/2015 confirmed a disc protrusion at L5-S1  8. Diaphoresis, rhinorrhea, shortness of breath and cough following cycle 1 FOLFIRI/Avastin, did not occur following cycle 2  9. Oral mucositis secondary to chemotherapy  10. Nonproductive cough-most likely secondary to colon cancer; improved 11/23/2015  11. Neutropenia secondary to chemotherapy -Neulasta added with cycle 6 FOLFIRI 01/11/2016    Disposition:  Martha Becker appears stable. I discussed the restaging CT findings with her. We discussed treatment options. The CTs reveal continued improvement in the lung nodules and dominant liver lesion. No evidence of disease progression. I recommend continuing FOLFIRI/Avastin. Martha Becker would like to take a treatment break. She understands the likelihood of disease progression over the next few months. She understands the limited available systemic treatment options given the presence of a RAS mutation.  After extensive discussion she would like to take a treatment break. She agrees to a return visit  with a repeat CEA and Port-A-Cath flush in 6 weeks.  Betsy Coder, MD  04/19/2016  12:16 PM

## 2016-04-19 NOTE — Progress Notes (Signed)
Oncology Nurse Navigator Documentation  Oncology Nurse Navigator Flowsheets 04/19/2016  Navigator Location [facility]  Navigator Encounter Type Follow-up Appt  Telephone -  Treatment Initiated Date -  Patient Visit Type MedOnc  Treatment Phase Follow-up--last tx 04/04/16  Barriers/Navigation Needs Education  Education Understanding Cancer/ Treatment Options;Coping with Diagnosis/ Prognosis  Interventions Other--supportive listening and redirected her to positive thoughts/outlook and not to dwell on "what ifs".   Referrals -  Coordination of Care -  Education Method -  Support Groups/Services -  Specialty Items/DME -  Acuity Level 2  Time Spent with Patient 30  Time Spent with Patient (Retired) -  Lorey wants to try to take 3 months off from treatment for her son visiting in September and some trips with him as well as a beach trip in October. She agrees to see MD before her son comes in town for baseline check and labs. She very much wants MD to be able to give her absolute predictions on the behavior of her cancer--informed her that this is not possible. She is trying to focus on having a good positive attitude and enjoying life and eating healthy.

## 2016-04-26 ENCOUNTER — Telehealth: Payer: Self-pay | Admitting: Oncology

## 2016-04-26 NOTE — Telephone Encounter (Signed)
CALLED PT TO CONFIRM APPT. L/M . APPOINTMENT LETTER AND SCHEDULE MAILED. 04/26/16

## 2016-04-28 ENCOUNTER — Telehealth: Payer: Self-pay | Admitting: *Deleted

## 2016-04-28 NOTE — Telephone Encounter (Signed)
Oncology Nurse Navigator Documentation  Oncology Nurse Navigator Flowsheets 04/28/2016  Navigator Location [facility]  Navigator Encounter Type Telephone  Telephone Incoming Call;Outgoing Call;FMLA/Disability-needs to mail form to office to be completed for her continued disability and wants to know who to send it to.  Treatment Initiated Date -  Patient Visit Type -  Treatment Phase -  Barriers/Navigation Needs -  Education -  Interventions Other--instructed her to send to [facility] and put att: Brandt Loosen. I will call her when it is received and will forward it to the appropriate person.  Referrals -  Coordination of Care -  Education Method -  Support Groups/Services -  Specialty Items/DME -  Acuity -  Time Spent with Patient 15  Time Spent with Patient (Retired) -

## 2016-05-01 NOTE — Telephone Encounter (Signed)
Called Juliann back and made her aware that her forms have been received and forwarded to [employer] in managed care to complete.

## 2016-05-02 ENCOUNTER — Encounter: Payer: Self-pay | Admitting: Oncology

## 2016-05-02 NOTE — Progress Notes (Signed)
form left in box

## 2016-05-04 ENCOUNTER — Encounter: Payer: Self-pay | Admitting: Oncology

## 2016-05-04 NOTE — Progress Notes (Signed)
form left in box- left for dr. Benay Spice to sign. faxed [phone] and sent to medical recds and mailed copy to patient for her recrds

## 2016-05-04 NOTE — Progress Notes (Signed)
form left in box- left for dr. Benay Spice to sign

## 2016-05-23 ENCOUNTER — Ambulatory Visit (HOSPITAL_BASED_OUTPATIENT_CLINIC_OR_DEPARTMENT_OTHER): Payer: 59 | Admitting: Nurse Practitioner

## 2016-05-23 ENCOUNTER — Other Ambulatory Visit: Payer: 59

## 2016-05-23 ENCOUNTER — Ambulatory Visit: Payer: 59 | Admitting: Oncology

## 2016-05-23 ENCOUNTER — Ambulatory Visit (HOSPITAL_BASED_OUTPATIENT_CLINIC_OR_DEPARTMENT_OTHER): Payer: 59

## 2016-05-23 VITALS — BP 116/82 | HR 71 | Temp 97.9°F | Resp 18 | Ht 66.0 in | Wt 149.1 lb

## 2016-05-23 DIAGNOSIS — C787 Secondary malignant neoplasm of liver and intrahepatic bile duct: Secondary | ICD-10-CM

## 2016-05-23 DIAGNOSIS — C7802 Secondary malignant neoplasm of left lung: Secondary | ICD-10-CM

## 2016-05-23 DIAGNOSIS — C180 Malignant neoplasm of cecum: Secondary | ICD-10-CM

## 2016-05-23 DIAGNOSIS — C7800 Secondary malignant neoplasm of unspecified lung: Secondary | ICD-10-CM

## 2016-05-23 DIAGNOSIS — C7801 Secondary malignant neoplasm of right lung: Secondary | ICD-10-CM

## 2016-05-23 DIAGNOSIS — K1231 Oral mucositis (ulcerative) due to antineoplastic therapy: Secondary | ICD-10-CM

## 2016-05-23 DIAGNOSIS — D701 Agranulocytosis secondary to cancer chemotherapy: Secondary | ICD-10-CM

## 2016-05-23 DIAGNOSIS — C182 Malignant neoplasm of ascending colon: Secondary | ICD-10-CM

## 2016-05-23 DIAGNOSIS — Z8672 Personal history of thrombophlebitis: Secondary | ICD-10-CM

## 2016-05-23 DIAGNOSIS — Z95828 Presence of other vascular implants and grafts: Secondary | ICD-10-CM

## 2016-05-23 DIAGNOSIS — G620 Drug-induced polyneuropathy: Secondary | ICD-10-CM

## 2016-05-23 LAB — CEA (IN HOUSE-CHCC): CEA (CHCC-IN HOUSE): 18.72 ng/mL — AB (ref 0.00–5.00)

## 2016-05-23 MED ORDER — HEPARIN SOD (PORK) LOCK FLUSH 100 UNIT/ML IV SOLN
500.0000 [IU] | INTRAVENOUS | Status: AC | PRN
  Administered 2016-05-23: 500 [IU]
  Filled 2016-05-23: qty 5

## 2016-05-23 MED ORDER — SODIUM CHLORIDE 0.9 % IJ SOLN
10.0000 mL | INTRAMUSCULAR | Status: AC | PRN
  Administered 2016-05-23: 10 mL
  Filled 2016-05-23: qty 10

## 2016-05-23 NOTE — Progress Notes (Addendum)
[name]   Diagnosis:  Colon cancer  INTERVAL HISTORY:   Martha Becker returns as scheduled. She is currently on a treatment break. She feels well. No nausea or vomiting. No diarrhea or constipation. No abdominal pain. She has a good appetite. She reports intermittent "achy" discomfort at the left low back. She continues to cough periodically.  Objective:  Vital signs in last 24 hours:  Blood pressure 116/82, pulse 71, temperature 97.9 F (36.6 C), temperature source Oral, resp. rate 18, height 5' 6"  (1.676 m), weight 149 lb 1.6 oz (67.6 kg), SpO2 100 %.    HEENT: No thrush or ulcers. Lymphatics: No palpable cervical, supra clavicular or axillary lymph nodes. Resp: Lungs clear bilaterally. Cardio: Regular rate and rhythm. GI: No hepatomegaly. Nontender. Vascular: No leg edema. Musculoskeletal: Nontender left low back. Port-A-Cath without erythema.    Lab Results:  Lab Results  Component Value Date   WBC 8.3 04/04/2016   HGB 13.3 04/04/2016   HCT 39.7 04/04/2016   MCV 92.8 04/04/2016   PLT 221 04/04/2016   NEUTROABS 5.1 04/04/2016    Imaging:  No results found.  Medications: I have reviewed the patient's current medications.  Assessment/Plan: 1. 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 no loss of mismatch repair protein expression, microsatellite stable ? 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 APC alteration detected ? Staging CT scans December 2015 and PET scan 09/22/2014 consistent with metastatic bilateral lung nodules, metastatic retroperitoneal lymphadenopathy, and a probable left liver metastasis ? Cycle 1 CAPOX 10/29/2014 (Xeloda discontinued day 9 secondary to diarrhea)  ? CT 11/14/2014 revealed progressive metastatic disease at the lung bases and liver compared to a PET  scan from 09/22/2014, mild improvement in retroperitoneal lymphadenopathy ? Cycle 1 FOLFOX 12/02/2014 ? Cycle 2 FOLFOX, Avastin added 12/16/2014 ? Cycle 3 FOLFOX/Avastin 12/30/2014 ? Cycle 4 FOLFOX/Avastin 01/13/2015 ? Cycle 5 FOLFOX/Avastin 01/27/2015  CT 02/10/2015 with improvement in hepatic metastases, mild improvement of lung metastases and retroperitoneal lymphadenopathy  Cycle 6 FOLFOX/Avastin 02/16/2015  Cycle 7 FOLFOX/Avastin 03/02/2015  Cycle 8 FOLFOX/Avastin 03/16/2015  Cycle 9 FOLFOX/Avastin 04/13/2015  Restaging CTs 04/20/2015 with a decrease in the size of pulmonary nodules, liver lesions, and retroperitoneal lymph nodes  Restaging CTs 07/27/2015 revealed enlargement of bilateral lung nodules, stable liver lesions, decreased periaortic adenopathy  CTs 10/05/2015 revealed further enlargement of bilateral lung nodules ,enlargement of liver lesions, and enlargement of peritoneal lymph nodes  Cycle 1 FOLFIRI/Avastin 10/12/2015  Cycle 2 FOLFIRI/Avastin 10/26/2015  Cycle 3 FOLFIRI/Avastin 11/09/2015  Cycle 4 FOLFIRI/Avastin 11/23/2015  Cycle 5 FOLFIRI/Avastin 12/28/2015  CTs 01/06/2016-decreased size of lung nodules , decreased / regression of liver lesions, regression of retroperitoneal nodes  Cycle 6 FOLFIRI/Avastin 01/11/2016 (Neulasta added)   Cycle 7 FOLFIRI/Avastin 02/01/2016 with Neulasta support  Cycle 8 FOLFIRI/Avastin 02/22/2016 with Neulasta support  Cycle 9 FOLFIRI/Avastin 03/13/2016 with Neulasta support  Cycle 10 FOLFIRI/Avastin 04/04/2016 with Neulasta support  Restaging CTs 04/17/2016-decreased size of lung and liver lesions  Patient decided on treatment break  2. Elevated TSH, PET scan 09/22/2014 with diffuse increased metabolic uptake in the thyroid   3. Family history of multiple cancers including colon cancer in her paternal grandfather and rectal cancer in a paternal first cousin  50. Admission 11/14/2014 with nausea and  diarrhea, most likely secondary to capecitabine induced enteritis-resolved. CT 11/14/2014 consistent with ileitis  5.Superficial phlebitis left hand/wrist 12/30/2014.  6. Early oxaliplatin neuropathy  7. Pain at the right gluteus and right leg-MRI of the lumbar spine 07/13/2015 confirmed a disc protrusion at L5-S1  8. Diaphoresis, rhinorrhea, shortness of breath and cough following cycle 1 FOLFIRI/Avastin, did not occur following cycle 2  9. Oral mucositis secondary to chemotherapy  10. Nonproductive cough-most likely secondary to colon cancer; improved 11/23/2015  11. Neutropenia secondary to chemotherapy -Neulasta added with cycle 6 FOLFIRI 01/11/2016   Disposition: Martha Becker appears stable. There is no clinical evidence of disease progression. She would like to continue on a treatment break. She will return for a follow-up visit, CEA in approximately 6 weeks. She will contact the office in the interim with any problems.  Patient seen with Dr. Benay Spice.    Ned Card ANP/GNP-BC   05/23/2016  3:34 PM  This was a shared visit with Ned Card. Martha Becker would like to continue observation. She will return for an office visit in 6 weeks.  [name], M.D.

## 2016-05-23 NOTE — Patient Instructions (Signed)

## 2016-05-24 LAB — CEA: CEA1: 20.3 ng/mL — AB (ref 0.0–4.7)

## 2016-05-25 ENCOUNTER — Telehealth: Payer: Self-pay | Admitting: *Deleted

## 2016-05-25 DIAGNOSIS — C180 Malignant neoplasm of cecum: Secondary | ICD-10-CM

## 2016-05-25 DIAGNOSIS — C7800 Secondary malignant neoplasm of unspecified lung: Secondary | ICD-10-CM

## 2016-05-25 NOTE — Telephone Encounter (Signed)
Call from pt, she is concerned about elevated CEA. She is thinking about going to [facility]. Per Dr. Benay Spice: CEA is slightly higher, do not need to do anything differently right now. It is a good idea to see Dr. Fanny Skates again to discuss clinical trials. Returned call to pt, she asks that we make the referral. She has several questions re: risk, cost etc.  Pt asks if there is an option to resume chemo if she doesn't respond to immunotherapy. Is it better to take the chemo for as long as possible or try immunotherapy first? Per Dr. Benay Spice: Chemo would still be an option down the line. It would be better to look into immunotherapy first.

## 2016-05-26 ENCOUNTER — Telehealth: Payer: Self-pay | Admitting: Oncology

## 2016-05-26 NOTE — Telephone Encounter (Signed)
Lt mess for pt for an appointment at [facility] 9/18 at 2 pm and the contact number [phone]

## 2016-06-08 ENCOUNTER — Telehealth: Payer: Self-pay | Admitting: Oncology

## 2016-06-08 NOTE — Telephone Encounter (Signed)
SPOKE WITH PATIENT RE APPOINTMENTS FOR 10/20. BS FULL 10/19 AND PATIENT CANNOT COME EARLY AM 10/20 - F/U SCHEDULED FOR 1230.

## 2016-06-19 ENCOUNTER — Telehealth: Payer: Self-pay | Admitting: *Deleted

## 2016-06-19 NOTE — Telephone Encounter (Signed)
Discussed pt's call with Dr. Benay Spice: Symptoms are likely related to the cancer. We can check TSH at next visit. Returned call to pt, she understands to call office for worsening symptoms. Will follow up as scheduled 10/20.

## 2016-06-19 NOTE — Telephone Encounter (Signed)
Message from pt requesting return call: having some symptoms. Pt reports a change in diet, noticing changes in her energy level. Feeling "wiped out some days."  Pt wonders if increased caloric intake can be affecting her thyroid levels. Pt reports she canceled her appt at [facility]. She plans to call to reschedule.

## 2016-06-23 ENCOUNTER — Telehealth: Payer: Self-pay | Admitting: *Deleted

## 2016-06-23 NOTE — Telephone Encounter (Signed)
"  Vilinda Boehringer with Dr. Fanny [phone]) calling for information about this patient referred to Dr.Sherrill in February. We've not received any information on this patient.  Could we receive recent office notes and scans.  Fax: Fax [phone]."  Added provider to care team list and routed information as requested.

## 2016-06-29 ENCOUNTER — Other Ambulatory Visit: Payer: 59

## 2016-06-30 ENCOUNTER — Other Ambulatory Visit: Payer: 59

## 2016-06-30 ENCOUNTER — Ambulatory Visit (HOSPITAL_BASED_OUTPATIENT_CLINIC_OR_DEPARTMENT_OTHER): Payer: 59

## 2016-06-30 ENCOUNTER — Ambulatory Visit (HOSPITAL_BASED_OUTPATIENT_CLINIC_OR_DEPARTMENT_OTHER): Payer: 59 | Admitting: Oncology

## 2016-06-30 VITALS — BP 112/68 | HR 69 | Temp 98.4°F | Resp 18 | Ht 66.0 in | Wt 145.3 lb

## 2016-06-30 DIAGNOSIS — C180 Malignant neoplasm of cecum: Secondary | ICD-10-CM

## 2016-06-30 DIAGNOSIS — C7802 Secondary malignant neoplasm of left lung: Secondary | ICD-10-CM

## 2016-06-30 DIAGNOSIS — C7800 Secondary malignant neoplasm of unspecified lung: Secondary | ICD-10-CM

## 2016-06-30 DIAGNOSIS — C182 Malignant neoplasm of ascending colon: Secondary | ICD-10-CM

## 2016-06-30 DIAGNOSIS — Z800 Family history of malignant neoplasm of digestive organs: Secondary | ICD-10-CM

## 2016-06-30 DIAGNOSIS — C7801 Secondary malignant neoplasm of right lung: Secondary | ICD-10-CM | POA: Diagnosis not present

## 2016-06-30 DIAGNOSIS — G620 Drug-induced polyneuropathy: Secondary | ICD-10-CM

## 2016-06-30 DIAGNOSIS — K1231 Oral mucositis (ulcerative) due to antineoplastic therapy: Secondary | ICD-10-CM

## 2016-06-30 DIAGNOSIS — Z95828 Presence of other vascular implants and grafts: Secondary | ICD-10-CM

## 2016-06-30 DIAGNOSIS — R05 Cough: Secondary | ICD-10-CM

## 2016-06-30 DIAGNOSIS — C787 Secondary malignant neoplasm of liver and intrahepatic bile duct: Secondary | ICD-10-CM

## 2016-06-30 DIAGNOSIS — Z8672 Personal history of thrombophlebitis: Secondary | ICD-10-CM

## 2016-06-30 LAB — CEA (IN HOUSE-CHCC): CEA (CHCC-IN HOUSE): 22.97 ng/mL — AB (ref 0.00–5.00)

## 2016-06-30 MED ORDER — SODIUM CHLORIDE 0.9 % IJ SOLN
10.0000 mL | INTRAMUSCULAR | Status: AC | PRN
  Administered 2016-06-30: 10 mL
  Filled 2016-06-30: qty 10

## 2016-06-30 MED ORDER — HEPARIN SOD (PORK) LOCK FLUSH 100 UNIT/ML IV SOLN
500.0000 [IU] | INTRAVENOUS | Status: AC | PRN
  Administered 2016-06-30: 500 [IU]
  Filled 2016-06-30: qty 5

## 2016-06-30 NOTE — Progress Notes (Signed)
[place] OFFICE PROGRESS NOTE   Diagnosis: Colon cancer  INTERVAL HISTORY:   Martha Becker returns as scheduled. She has an intermittent cough with white sputum. No dyspnea. Good appetite. She has intermittent episodes of malaise. She reports having diarrhea approximately once per week. She saw Dr. Fanny Skates earlier this week. She did not recommend enrollment on a clinical trial. She recommends considering 5-FU/Avastin when chemotherapy is resumed.  Objective:  Vital signs in last 24 hours:  Blood pressure 112/68, pulse 69, temperature 98.4 F (36.9 C), temperature source Oral, resp. rate 18, height 5' 6"  (1.676 m), weight 145 lb 4.8 oz (65.9 kg), SpO2 100 %.    HEENT: Neck without mass Resp: End inspiratory rales at the left posterior base, no respiratory distress Cardio: Regular rate and rhythm GI: No hepatosplenomegaly, nontender, no apparent ascites Vascular: No leg edema  Skin: Mild hyperpigmentation of the hands   Portacath/PICC-without erythema  Lab Results:  CEA pending    Medications: I have reviewed the patient's current medications.  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 no loss of mismatch repair protein expression, microsatellite stable ? 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 APC alteration detected ? Staging CT scans December 2015 and PET scan 09/22/2014 consistent with metastatic bilateral lung nodules, metastatic retroperitoneal lymphadenopathy, and a probable left liver metastasis ? Cycle 1 CAPOX 10/29/2014 (Xeloda discontinued day 9 secondary to diarrhea)  ? CT 11/14/2014 revealed progressive metastatic disease at the lung bases and liver compared to a PET scan from 09/22/2014, mild improvement in retroperitoneal lymphadenopathy ? Cycle 1 FOLFOX 12/02/2014 ? Cycle 2  FOLFOX, Avastin added 12/16/2014 ? Cycle 3 FOLFOX/Avastin 12/30/2014 ? Cycle 4 FOLFOX/Avastin 01/13/2015 ? Cycle 5 FOLFOX/Avastin 01/27/2015  CT 02/10/2015 with improvement in hepatic metastases, mild improvement of lung metastases and retroperitoneal lymphadenopathy  Cycle 6 FOLFOX/Avastin 02/16/2015  Cycle 7 FOLFOX/Avastin 03/02/2015  Cycle 8 FOLFOX/Avastin 03/16/2015  Cycle 9 FOLFOX/Avastin 04/13/2015  Restaging CTs 04/20/2015 with a decrease in the size of pulmonary nodules, liver lesions, and retroperitoneal lymph nodes  Restaging CTs 07/27/2015 revealed enlargement of bilateral lung nodules, stable liver lesions, decreased periaortic adenopathy  CTs 10/05/2015 revealed further enlargement of bilateral lung nodules ,enlargement of liver lesions, and enlargement of peritoneal lymph nodes  Cycle 1 FOLFIRI/Avastin 10/12/2015  Cycle 2 FOLFIRI/Avastin 10/26/2015  Cycle 3 FOLFIRI/Avastin 11/09/2015  Cycle 4 FOLFIRI/Avastin 11/23/2015  Cycle 5 FOLFIRI/Avastin 12/28/2015  CTs 01/06/2016-decreased size of lung nodules , decreased / regression of liver lesions, regression of retroperitoneal nodes  Cycle 6 FOLFIRI/Avastin 01/11/2016 (Neulasta added)   Cycle 7 FOLFIRI/Avastin 02/01/2016 with Neulasta support  Cycle 8 FOLFIRI/Avastin 02/22/2016 with Neulasta support  Cycle 9 FOLFIRI/Avastin 03/13/2016 with Neulasta support  Cycle 10 FOLFIRI/Avastin 04/04/2016 with Neulasta support  Restaging CTs 04/17/2016-decreased size of lung and liver lesions  Patient decided on treatment break  2. Elevated TSH, PET scan 09/22/2014 with diffuse increased metabolic uptake in the thyroid   3. Family history of multiple cancers including colon cancer in her paternal grandfather and rectal cancer in a paternal first cousin  8. Admission 11/14/2014 with nausea and diarrhea, most likely secondary to capecitabine induced enteritis-resolved. CT 11/14/2014 consistent with  ileitis  5.Superficial phlebitis left hand/wrist 12/30/2014.  6. Early oxaliplatin neuropathy  7. Pain at the right gluteus and right leg-MRI of the lumbar spine 07/13/2015 confirmed a disc protrusion at L5-S1  8. Diaphoresis, rhinorrhea, shortness of  breath and cough following cycle 1 FOLFIRI/Avastin, did not occur following cycle 2  9. Oral mucositis secondary to chemotherapy  10. Nonproductive cough-most likely secondary to colon cancer; improved 11/23/2015  11. Neutropenia secondary to chemotherapy -Neulasta added with cycle 6 FOLFIRI 01/11/2016     Disposition:  She appears unchanged. She is comfortable with continued observation. We will follow-up on the CEA from today. She will return for an office visit and CEA in 6 weeks.  We will plan for a restaging CT evaluation after the office visit in 6 weeks.  We discussed standard systemic therapy options including resumption of FOLFIRI/Avastin or treatment with 5 fluorouracil/Avastin. We also discussed Xeloda/Avastin. She is reluctant to takie Xeloda based on the diarrhea she had following a single cycle of CAPOX in 2016.  Ms. Nodal will contact us for new symptoms.  Betsy Coder, MD  06/30/2016  1:17 PM

## 2016-07-01 LAB — CEA: CEA1: 25 ng/mL — AB (ref 0.0–4.7)

## 2016-07-18 ENCOUNTER — Other Ambulatory Visit: Payer: Self-pay | Admitting: *Deleted

## 2016-07-18 MED ORDER — ONDANSETRON HCL 4 MG PO TABS: 4.0000 mg | ORAL_TABLET | Freq: Four times a day (QID) | ORAL | 1 refills | Status: AC | PRN

## 2016-07-18 NOTE — Progress Notes (Signed)
Message received from patient requesting refill on Zofran.  Ok for Zofran refill per Dr. Benay Spice.  Patient notified that refill has been sent in and appreciative of call.

## 2016-07-25 ENCOUNTER — Telehealth: Payer: Self-pay | Admitting: General Practice

## 2016-07-25 NOTE — Telephone Encounter (Signed)
Left msg regarding November appts.

## 2016-08-09 ENCOUNTER — Ambulatory Visit (HOSPITAL_BASED_OUTPATIENT_CLINIC_OR_DEPARTMENT_OTHER): Payer: 59 | Admitting: Nurse Practitioner

## 2016-08-09 ENCOUNTER — Other Ambulatory Visit (HOSPITAL_BASED_OUTPATIENT_CLINIC_OR_DEPARTMENT_OTHER): Payer: 59

## 2016-08-09 ENCOUNTER — Ambulatory Visit (HOSPITAL_BASED_OUTPATIENT_CLINIC_OR_DEPARTMENT_OTHER): Payer: 59

## 2016-08-09 ENCOUNTER — Telehealth: Payer: Self-pay | Admitting: Oncology

## 2016-08-09 VITALS — BP 114/70 | HR 72 | Temp 98.5°F | Resp 17 | Ht 66.0 in | Wt 149.6 lb

## 2016-08-09 DIAGNOSIS — C7802 Secondary malignant neoplasm of left lung: Secondary | ICD-10-CM | POA: Diagnosis not present

## 2016-08-09 DIAGNOSIS — C182 Malignant neoplasm of ascending colon: Secondary | ICD-10-CM | POA: Diagnosis not present

## 2016-08-09 DIAGNOSIS — R5383 Other fatigue: Secondary | ICD-10-CM

## 2016-08-09 DIAGNOSIS — G620 Drug-induced polyneuropathy: Secondary | ICD-10-CM

## 2016-08-09 DIAGNOSIS — C7801 Secondary malignant neoplasm of right lung: Secondary | ICD-10-CM

## 2016-08-09 DIAGNOSIS — C180 Malignant neoplasm of cecum: Secondary | ICD-10-CM

## 2016-08-09 DIAGNOSIS — C801 Malignant (primary) neoplasm, unspecified: Secondary | ICD-10-CM

## 2016-08-09 DIAGNOSIS — Z95828 Presence of other vascular implants and grafts: Secondary | ICD-10-CM

## 2016-08-09 DIAGNOSIS — C787 Secondary malignant neoplasm of liver and intrahepatic bile duct: Secondary | ICD-10-CM

## 2016-08-09 DIAGNOSIS — Z800 Family history of malignant neoplasm of digestive organs: Secondary | ICD-10-CM

## 2016-08-09 DIAGNOSIS — K1231 Oral mucositis (ulcerative) due to antineoplastic therapy: Secondary | ICD-10-CM

## 2016-08-09 DIAGNOSIS — R05 Cough: Secondary | ICD-10-CM

## 2016-08-09 DIAGNOSIS — C786 Secondary malignant neoplasm of retroperitoneum and peritoneum: Secondary | ICD-10-CM

## 2016-08-09 DIAGNOSIS — C7800 Secondary malignant neoplasm of unspecified lung: Secondary | ICD-10-CM

## 2016-08-09 DIAGNOSIS — Z8672 Personal history of thrombophlebitis: Secondary | ICD-10-CM

## 2016-08-09 LAB — COMPREHENSIVE METABOLIC PANEL
ALBUMIN: 3.2 g/dL — AB (ref 3.5–5.0)
ALK PHOS: 112 U/L (ref 40–150)
ALT: 15 U/L (ref 0–55)
ANION GAP: 10 meq/L (ref 3–11)
AST: 29 U/L (ref 5–34)
BILIRUBIN TOTAL: 0.49 mg/dL (ref 0.20–1.20)
BUN: 9 mg/dL (ref 7.0–26.0)
CALCIUM: 9.9 mg/dL (ref 8.4–10.4)
CO2: 27 mEq/L (ref 22–29)
Chloride: 98 mEq/L (ref 98–109)
Creatinine: 0.7 mg/dL (ref 0.6–1.1)
GLUCOSE: 86 mg/dL (ref 70–140)
POTASSIUM: 4.3 meq/L (ref 3.5–5.1)
SODIUM: 135 meq/L — AB (ref 136–145)
TOTAL PROTEIN: 6.9 g/dL (ref 6.4–8.3)

## 2016-08-09 LAB — CBC WITH DIFFERENTIAL/PLATELET
BASO%: 0.8 % (ref 0.0–2.0)
BASOS ABS: 0.1 10*3/uL (ref 0.0–0.1)
EOS ABS: 0.2 10*3/uL (ref 0.0–0.5)
EOS%: 1.6 % (ref 0.0–7.0)
HEMATOCRIT: 37.1 % (ref 34.8–46.6)
HEMOGLOBIN: 12.3 g/dL (ref 11.6–15.9)
LYMPH%: 11.9 % — ABNORMAL LOW (ref 14.0–49.7)
MCH: 29.3 pg (ref 25.1–34.0)
MCHC: 33.2 g/dL (ref 31.5–36.0)
MCV: 88.2 fL (ref 79.5–101.0)
MONO#: 1 10*3/uL — AB (ref 0.1–0.9)
MONO%: 9 % (ref 0.0–14.0)
NEUT%: 76.7 % (ref 38.4–76.8)
NEUTROS ABS: 8.4 10*3/uL — AB (ref 1.5–6.5)
PLATELETS: 345 10*3/uL (ref 145–400)
RBC: 4.2 10*6/uL (ref 3.70–5.45)
RDW: 14.1 % (ref 11.2–14.5)
WBC: 10.9 10*3/uL — AB (ref 3.9–10.3)
lymph#: 1.3 10*3/uL (ref 0.9–3.3)

## 2016-08-09 LAB — CEA (IN HOUSE-CHCC): CEA (CHCC-In House): 27.32 ng/mL — ABNORMAL HIGH (ref 0.00–5.00)

## 2016-08-09 MED ORDER — HEPARIN SOD (PORK) LOCK FLUSH 100 UNIT/ML IV SOLN
500.0000 [IU] | INTRAVENOUS | Status: AC | PRN
  Administered 2016-08-09: 500 [IU]
  Filled 2016-08-09: qty 5

## 2016-08-09 MED ORDER — SODIUM CHLORIDE 0.9 % IJ SOLN
10.0000 mL | INTRAMUSCULAR | Status: AC | PRN
  Administered 2016-08-09: 10 mL
  Filled 2016-08-09: qty 10

## 2016-08-09 NOTE — Telephone Encounter (Signed)
Patient did not check out at scheduling. Called patient to confirm follow up appointment, per 08/09/16 los. Left voice message with appointment details. 08/09/16

## 2016-08-09 NOTE — Progress Notes (Addendum)
[place] OFFICE PROGRESS NOTE   Diagnosis: Colon cancer   INTERVAL HISTORY:   Martha Becker returns as scheduled. She overall has a good appetite. Bowels moving. She has occasional nausea. No pain. She notes an alteration in taste. She continues to have a cough intermittently and wonders if a bronchodilator would help. Her main complaint today is "episodes of total exhaustion". The episodes do not occur on a daily basis. When they do occur she has rest for a period of time. She reports her last TSH was "9". She does not take Synthroid due to concerns about the side effects.  Objective:  Vital signs in last 24 hours:  Blood pressure 114/70, pulse 72, temperature 98.5 F (36.9 C), temperature source Oral, resp. rate 17, height _0  (1.676 m), weight 149 lb 9.6 oz (67.9 kg), SpO2 97 %.    HEENT: No thrush or ulcers. Lymphatics: No palpable cervical, supra clavicular, axillary or inguinal lymph nodes. Resp: Lungs clear bilaterally. Cardio: Regular rate and rhythm. GI: Abdomen soft and nontender. No hepatomegaly. Vascular: No leg edema. Skin: No rash. Port-A-Cath without erythema.    Lab Results:  Lab Results  Component Value Date   WBC 10.9 (H) 08/09/2016   HGB 12.3 08/09/2016   HCT 37.1 08/09/2016   MCV 88.2 08/09/2016   PLT 345 08/09/2016   NEUTROABS 8.4 (H) 08/09/2016    Imaging:  No results found.  Medications: I have reviewed the patient's current medications.  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 no loss of mismatch repair protein expression, microsatellite stable ? 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 APC alteration detected ? Staging CT scans December 2015 and PET scan 09/22/2014 consistent with metastatic bilateral lung nodules, metastatic retroperitoneal lymphadenopathy,  and a probable left liver metastasis ? Cycle 1 CAPOX 10/29/2014 (Xeloda discontinued day 9 secondary to diarrhea)  ? CT 11/14/2014 revealed progressive metastatic disease at the lung bases and liver compared to a PET scan from 09/22/2014, mild improvement in retroperitoneal lymphadenopathy ? Cycle 1 FOLFOX 12/02/2014 ? Cycle 2 FOLFOX, Avastin added 12/16/2014 ? Cycle 3 FOLFOX/Avastin 12/30/2014 ? Cycle 4 FOLFOX/Avastin 01/13/2015 ? Cycle 5 FOLFOX/Avastin 01/27/2015  CT 02/10/2015 with improvement in hepatic metastases, mild improvement of lung metastases and retroperitoneal lymphadenopathy  Cycle 6 FOLFOX/Avastin 02/16/2015  Cycle 7 FOLFOX/Avastin 03/02/2015  Cycle 8 FOLFOX/Avastin 03/16/2015  Cycle 9 FOLFOX/Avastin 04/13/2015  Restaging CTs 04/20/2015 with a decrease in the size of pulmonary nodules, liver lesions, and retroperitoneal lymph nodes  Restaging CTs 07/27/2015 revealed enlargement of bilateral lung nodules, stable liver lesions, decreased periaortic adenopathy  CTs 10/05/2015 revealed further enlargement of bilateral lung nodules ,enlargement of liver lesions, and enlargement of peritoneal lymph nodes  Cycle 1 FOLFIRI/Avastin 10/12/2015  Cycle 2 FOLFIRI/Avastin 10/26/2015  Cycle 3 FOLFIRI/Avastin 11/09/2015  Cycle 4 FOLFIRI/Avastin 11/23/2015  Cycle 5 FOLFIRI/Avastin 12/28/2015  CTs 01/06/2016-decreased size of lung nodules , decreased / regression of liver lesions, regression of retroperitoneal nodes  Cycle 6 FOLFIRI/Avastin 01/11/2016 (Neulasta added)   Cycle 7 FOLFIRI/Avastin 02/01/2016 with Neulasta support  Cycle 8 FOLFIRI/Avastin 02/22/2016 with Neulasta support  Cycle 9 FOLFIRI/Avastin 03/13/2016 with Neulasta support  Cycle 10 FOLFIRI/Avastin 04/04/2016 with Neulasta support  Restaging CTs 04/17/2016-decreased size of lung and liver lesions  Patient decided on treatment break  2. Elevated TSH, PET scan 09/22/2014 with diffuse increased  metabolic uptake in the thyroid   3. Family  history of multiple cancers including colon cancer in her paternal grandfather and rectal cancer in a paternal first cousin  13. Admission 11/14/2014 with nausea and diarrhea, most likely secondary to capecitabine induced enteritis-resolved. CT 11/14/2014 consistent with ileitis  5.Superficial phlebitis left hand/wrist 12/30/2014.  6. Early oxaliplatin neuropathy  7. Pain at the right gluteus and right leg-MRI of the lumbar spine 07/13/2015 confirmed a disc protrusion at L5-S1  8. Diaphoresis, rhinorrhea, shortness of breath and cough following cycle 1 FOLFIRI/Avastin, did not occur following cycle 2  9. Oral mucositis secondary to chemotherapy  10. Nonproductive cough-most likely secondary to colon cancer; improved 11/23/2015  11. Neutropenia secondary to chemotherapy -Neulasta added with cycle 6 FOLFIRI 01/11/2016   Disposition: Martha Becker appears unchanged. We will follow-up on the CEA from today. The plan is to obtain restaging CT scans next week. She will return for a follow-up visit on 08/18/2016 to review the results.  The etiology of the "episodes of total exhaustion" is unclear. We suggested she follow up with her PCP regarding hypothyroidism.  She understands the cough is likely related to colon cancer. She inquired about a bronchodilator. We told her it was unlikely this would help but she could try an albuterol inhaler. She decided against this once the potential side effects were reviewed.  Patient seen with Dr. Benay Spice.    Ned Card ANP/GNP-BC   08/09/2016  11:05 AM  This was a shared visit with Ned Card. Martha Becker will undergo a restaging CT and return for an office visit within the next 2 weeks.  [name], M.D.

## 2016-08-10 ENCOUNTER — Telehealth: Payer: Self-pay | Admitting: *Deleted

## 2016-08-10 NOTE — Telephone Encounter (Signed)
Oncology Nurse Navigator Documentation  Oncology Nurse Navigator Flowsheets 08/10/2016  Navigator Location [facility]  Navigator Encounter Type Telephone  Telephone Outgoing Call;Appt Confirmation/Clarification;Diagnostic Results  Treatment Initiated Date -  Patient Visit Type -  Treatment Phase -  Barriers/Navigation Needs Coordination of Care--CT scan and MD f/u  Education -  Interventions Coordination of Care--scheduled scan and rescheduled her f/u with Dr. Benay Spice  Referrals -  Coordination of Care Radiology;Appts  Education Method -  Support Groups/Services -  Specialty Items/DME -  Acuity Level 2  Time Spent with Patient 30  Time Spent with Patient (Retired) -  Provided patient CT scan for 12/5 at 5 pm, however this time was declined. She also declined to call radiology and schedule the scan herself. Navigator rescheduled the scan for 12/12 to arrive at 1145 to start drinking the water based contrast and scan at 2 pm. Instructed her to be NPO 4 hours prior. Reschedule OV to 12/13 at 2:15 per Dr. Drake Leach expressed that she does not like afternoon appointments and on Wednesday. Made her aware this is all we have at this time. She then accepted it.

## 2016-08-15 ENCOUNTER — Ambulatory Visit (HOSPITAL_COMMUNITY): Payer: 59

## 2016-08-18 ENCOUNTER — Ambulatory Visit: Payer: 59 | Admitting: Oncology

## 2016-08-22 ENCOUNTER — Ambulatory Visit (HOSPITAL_COMMUNITY)
Admission: RE | Admit: 2016-08-22 | Discharge: 2016-08-22 | Disposition: A | Discharge status: Home / Self Care | Payer: 59 | Source: Ambulatory Visit | Attending: Nurse Practitioner | Admitting: Nurse Practitioner

## 2016-08-22 DIAGNOSIS — C801 Malignant (primary) neoplasm, unspecified: Secondary | ICD-10-CM

## 2016-08-22 DIAGNOSIS — C787 Secondary malignant neoplasm of liver and intrahepatic bile duct: Secondary | ICD-10-CM | POA: Diagnosis not present

## 2016-08-22 DIAGNOSIS — C7800 Secondary malignant neoplasm of unspecified lung: Secondary | ICD-10-CM | POA: Diagnosis present

## 2016-08-22 DIAGNOSIS — C180 Malignant neoplasm of cecum: Secondary | ICD-10-CM | POA: Diagnosis not present

## 2016-08-22 DIAGNOSIS — C786 Secondary malignant neoplasm of retroperitoneum and peritoneum: Secondary | ICD-10-CM

## 2016-08-22 MED ORDER — HEPARIN SOD (PORK) LOCK FLUSH 100 UNIT/ML IV SOLN
INTRAVENOUS | Status: AC
  Filled 2016-08-22: qty 5

## 2016-08-22 MED ORDER — IOPAMIDOL (ISOVUE-300) INJECTION 61%
INTRAVENOUS | Status: AC
  Filled 2016-08-22: qty 30

## 2016-08-22 MED ORDER — HEPARIN SOD (PORK) LOCK FLUSH 100 UNIT/ML IV SOLN
INTRAVENOUS | Status: AC
  Administered 2016-08-22: 500 [IU]
  Filled 2016-08-22: qty 5

## 2016-08-22 MED ORDER — IOPAMIDOL (ISOVUE-300) INJECTION 61%
100.0000 mL | Freq: Once | INTRAVENOUS | Status: AC | PRN
  Administered 2016-08-22: 100 mL via INTRAVENOUS

## 2016-08-23 ENCOUNTER — Ambulatory Visit (HOSPITAL_BASED_OUTPATIENT_CLINIC_OR_DEPARTMENT_OTHER): Payer: 59 | Admitting: Oncology

## 2016-08-23 ENCOUNTER — Telehealth: Payer: Self-pay | Admitting: Oncology

## 2016-08-23 ENCOUNTER — Encounter: Payer: Self-pay | Admitting: *Deleted

## 2016-08-23 VITALS — BP 117/75 | HR 82 | Temp 98.5°F | Resp 17 | Ht 66.0 in | Wt 149.5 lb

## 2016-08-23 DIAGNOSIS — R05 Cough: Secondary | ICD-10-CM

## 2016-08-23 DIAGNOSIS — K1231 Oral mucositis (ulcerative) due to antineoplastic therapy: Secondary | ICD-10-CM

## 2016-08-23 DIAGNOSIS — C7802 Secondary malignant neoplasm of left lung: Secondary | ICD-10-CM

## 2016-08-23 DIAGNOSIS — G620 Drug-induced polyneuropathy: Secondary | ICD-10-CM

## 2016-08-23 DIAGNOSIS — R5383 Other fatigue: Secondary | ICD-10-CM

## 2016-08-23 DIAGNOSIS — C7801 Secondary malignant neoplasm of right lung: Secondary | ICD-10-CM

## 2016-08-23 DIAGNOSIS — C182 Malignant neoplasm of ascending colon: Secondary | ICD-10-CM

## 2016-08-23 DIAGNOSIS — C180 Malignant neoplasm of cecum: Secondary | ICD-10-CM

## 2016-08-23 DIAGNOSIS — C787 Secondary malignant neoplasm of liver and intrahepatic bile duct: Secondary | ICD-10-CM

## 2016-08-23 DIAGNOSIS — Z800 Family history of malignant neoplasm of digestive organs: Secondary | ICD-10-CM

## 2016-08-23 DIAGNOSIS — Z8672 Personal history of thrombophlebitis: Secondary | ICD-10-CM

## 2016-08-23 NOTE — Progress Notes (Signed)
Oncology Nurse Navigator Documentation  Oncology Nurse Navigator Flowsheets 08/23/2016  Navigator Location [facility]  Navigator Encounter Type Follow-up Appt  Telephone -  Treatment Initiated Date -  Patient Visit Type MedOnc  Treatment Phase Abnormal Scans--progression  Barriers/Navigation Needs Family concerns  Education Coping with Diagnosis/ Prognosis;Preparing for Upcoming  Treatment  Interventions Education--/Supportive listening  Referrals -  Coordination of Care -  Education Method Verbal  Support Groups/Services -  Specialty Items/DME -  Acuity -  Time Spent with Patient 45  Time Spent with Patient (Retired) -  Attempted to help patient understand she needs to resume chemotherapy due to disease progression in lungs and liver and her diminished performance status. She asks for guarantee that the chemo will help, but reinforced what MD said-can't guarantee, but only way to know is to try or she will continue to decline and life expectancy will be months. She is tearful and has great difficulty making decisions. She has no support at home. Her sister lives 30 minutes away and Zuria says she has a lot going on in her life, so she never asks for help. She also admits it is very hard for her to ask for help. Encouraged her to reach out to her sister for help--anyone would need help in this situation.

## 2016-08-23 NOTE — Telephone Encounter (Signed)
Appointments scheduled per 12/13 LOS. Patient given AVS report and calendars with future scheduled appointments.  °

## 2016-08-23 NOTE — Progress Notes (Signed)
After patient left office, Dr. Benay Spice requests navigator call patient to strongly encourage her to have her chemo before Jan 3rd. In looking at her CT scan, she has had significant disease progression in her lungs and he is concerned about her. Navigator will allow her to get back home and relax and call her tomorrow.

## 2016-08-23 NOTE — Progress Notes (Signed)
[place] OFFICE PROGRESS NOTE   Diagnosis: Colon cancer  INTERVAL HISTORY:   Ms. Sonnenfeld returns as scheduled. She reports increased malaise and exertional dyspnea. She has an intermittent cough. She has good days and bad days, but in general her symptoms have progressed. She reports a good appetite. No pain.  Objective:  Vital signs in last 24 hours:  Blood pressure 117/75, pulse 82, temperature 98.5 F (36.9 C), temperature source Oral, resp. rate 17, height 5' 6" (1.676 m), weight 149 lb 8 oz (67.8 kg), SpO2 100 %.    Resp: Scattered end inspiratory rhonchi/wheezes, no respiratory distress Cardio: Regular rate and rhythm GI: No hepatomegaly, nontender, no mass Vascular: No leg edema Neuro: Alert and oriented     Portacath/PICC-without erythema  Lab Results:  Lab Results  Component Value Date   WBC 10.9 (H) 08/09/2016   HGB 12.3 08/09/2016   HCT 37.1 08/09/2016   MCV 88.2 08/09/2016   PLT 345 08/09/2016   NEUTROABS 8.4 (H) 08/09/2016   CEA-27.32  Imaging:  Ct Chest W Contrast  Result Date: 08/22/2016 CLINICAL DATA:  Followup metastatic colon carcinoma. Undergoing chemotherapy. Nonproductive cough. Restaging. EXAM: CT CHEST, ABDOMEN, AND PELVIS WITH CONTRAST TECHNIQUE: Multidetector CT imaging of the chest, abdomen and pelvis was performed following the standard protocol during bolus administration of intravenous contrast. CONTRAST:  156m ISOVUE-300 IOPAMIDOL (ISOVUE-300) INJECTION 61% COMPARISON:  04/18/2016 FINDINGS: CT CHEST FINDINGS Cardiovascular: No acute findings. Mediastinum/Lymph Nodes: No masses or pathologically enlarged lymph nodes identified. Lungs/Pleura: Innumerable bilateral pulmonary nodules have significantly increased in size and number compared with previous study. Index nodule in posterior right lower lobe measures 3.5 x 3.6 cm on image 93/3 compared to 1.2 x 01.3 cm previously. No evidence of pleural effusion. Musculoskeletal:  No  suspicious bone lesions identified. CT ABDOMEN AND PELVIS FINDINGS Hepatobiliary: Increased size and number of liver metastases seen in both right and left lobes. Index metastasis in posterior right hepatic lobe measures 4.7 x 2.7 cm on image 51/2 is new since previous study. Index lesion in lateral segment left hepatic lobe measures 6.2 x 5.1 cm on image 52/2 compared to 2.1 x 1.9 cm previously. Gallbladder is unremarkable. No evidence of biliary ductal dilatation. Pancreas:  No mass or inflammatory changes. Spleen:  Within normal limits in size and appearance. Adrenals/Urinary tract:  No masses or hydronephrosis. Stomach/Bowel: Stable postop changes from right hemicolectomy. No mass identified. No evidence of obstruction, inflammatory process, or abnormal fluid collections. Vascular/Lymphatic: No pathologically enlarged lymph nodes identified. No abdominal aortic aneurysm. Reproductive:  No mass or other significant abnormality identified. Other:  None. Musculoskeletal:  No suspicious bone lesions identified. IMPRESSION: Marked interval progression of diffuse bilateral pulmonary metastases since prior study. Interval progression of liver metastases since prior study. Electronically Signed   By: JEarle GellM.D.   On: 08/22/2016 17:00   Ct Abdomen Pelvis W Contrast  Result Date: 08/22/2016 CLINICAL DATA:  Followup metastatic colon carcinoma. Undergoing chemotherapy. Nonproductive cough. Restaging. EXAM: CT CHEST, ABDOMEN, AND PELVIS WITH CONTRAST TECHNIQUE: Multidetector CT imaging of the chest, abdomen and pelvis was performed following the standard protocol during bolus administration of intravenous contrast. CONTRAST:  1070mISOVUE-300 IOPAMIDOL (ISOVUE-300) INJECTION 61% COMPARISON:  04/18/2016 FINDINGS: CT CHEST FINDINGS Cardiovascular: No acute findings. Mediastinum/Lymph Nodes: No masses or pathologically enlarged lymph nodes identified. Lungs/Pleura: Innumerable bilateral pulmonary nodules have  significantly increased in size and number compared with previous study. Index nodule in posterior right lower lobe measures 3.5 x 3.6 cm  on image 93/3 compared to 1.2 x 01.3 cm previously. No evidence of pleural effusion. Musculoskeletal:  No suspicious bone lesions identified. CT ABDOMEN AND PELVIS FINDINGS Hepatobiliary: Increased size and number of liver metastases seen in both right and left lobes. Index metastasis in posterior right hepatic lobe measures 4.7 x 2.7 cm on image 51/2 is new since previous study. Index lesion in lateral segment left hepatic lobe measures 6.2 x 5.1 cm on image 52/2 compared to 2.1 x 1.9 cm previously. Gallbladder is unremarkable. No evidence of biliary ductal dilatation. Pancreas:  No mass or inflammatory changes. Spleen:  Within normal limits in size and appearance. Adrenals/Urinary tract:  No masses or hydronephrosis. Stomach/Bowel: Stable postop changes from right hemicolectomy. No mass identified. No evidence of obstruction, inflammatory process, or abnormal fluid collections. Vascular/Lymphatic: No pathologically enlarged lymph nodes identified. No abdominal aortic aneurysm. Reproductive:  No mass or other significant abnormality identified. Other:  None. Musculoskeletal:  No suspicious bone lesions identified. IMPRESSION: Marked interval progression of diffuse bilateral pulmonary metastases since prior study. Interval progression of liver metastases since prior study. Electronically Signed   By: Earle Gell M.D.   On: 08/22/2016 17:00    Medications: I have reviewed the patient's current medications.  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 no loss of mismatch repair protein expression, microsatellite stable ? 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 APC alteration detected ? Staging CT scans  December 2015 and PET scan 09/22/2014 consistent with metastatic bilateral lung nodules, metastatic retroperitoneal lymphadenopathy, and a probable left liver metastasis ? Cycle 1 CAPOX 10/29/2014 (Xeloda discontinued day 9 secondary to diarrhea)  ? CT 11/14/2014 revealed progressive metastatic disease at the lung bases and liver compared to a PET scan from 09/22/2014, mild improvement in retroperitoneal lymphadenopathy ? Cycle 1 FOLFOX 12/02/2014 ? Cycle 2 FOLFOX, Avastin added 12/16/2014 ? Cycle 3 FOLFOX/Avastin 12/30/2014 ? Cycle 4 FOLFOX/Avastin 01/13/2015 ? Cycle 5 FOLFOX/Avastin 01/27/2015  CT 02/10/2015 with improvement in hepatic metastases, mild improvement of lung metastases and retroperitoneal lymphadenopathy  Cycle 6 FOLFOX/Avastin 02/16/2015  Cycle 7 FOLFOX/Avastin 03/02/2015  Cycle 8 FOLFOX/Avastin 03/16/2015  Cycle 9 FOLFOX/Avastin 04/13/2015  Restaging CTs 04/20/2015 with a decrease in the size of pulmonary nodules, liver lesions, and retroperitoneal lymph nodes  Restaging CTs 07/27/2015 revealed enlargement of bilateral lung nodules, stable liver lesions, decreased periaortic adenopathy  CTs 10/05/2015 revealed further enlargement of bilateral lung nodules ,enlargement of liver lesions, and enlargement of peritoneal lymph nodes  Cycle 1 FOLFIRI/Avastin 10/12/2015  Cycle 2 FOLFIRI/Avastin 10/26/2015  Cycle 3 FOLFIRI/Avastin 11/09/2015  Cycle 4 FOLFIRI/Avastin 11/23/2015  Cycle 5 FOLFIRI/Avastin 12/28/2015  CTs 01/06/2016-decreased size of lung nodules , decreased / regression of liver lesions, regression of retroperitoneal nodes  Cycle 6 FOLFIRI/Avastin 01/11/2016 (Neulasta added)   Cycle 7 FOLFIRI/Avastin 02/01/2016 with Neulasta support  Cycle 8 FOLFIRI/Avastin 02/22/2016 with Neulasta support  Cycle 9 FOLFIRI/Avastin 03/13/2016 with Neulasta support  Cycle 10 FOLFIRI/Avastin 04/04/2016 with Neulasta support  Restaging CTs 04/17/2016-decreased size  of lung and liver lesions  Patient decided on treatment break  Restaging CTs 08/22/2016-marked progression in the lungs and liver  2. Elevated TSH, PET scan 09/22/2014 with diffuse increased metabolic uptake in the thyroid   3. Family history of multiple cancers including colon cancer in her paternal grandfather and rectal cancer in a paternal first cousin  60. Admission 11/14/2014 with nausea and diarrhea, most likely secondary to  capecitabine induced enteritis-resolved. CT 11/14/2014 consistent with ileitis  5.Superficial phlebitis left hand/wrist 12/30/2014.  6. Early oxaliplatin neuropathy  7. Pain at the right gluteus and right leg-MRI of the lumbar spine 07/13/2015 confirmed a disc protrusion at L5-S1  8. Diaphoresis, rhinorrhea, shortness of breath and cough following cycle 1 FOLFIRI/Avastin, did not occur following cycle 2  9. Oral mucositis secondary to chemotherapy  10. Nonproductive cough secondary to metastatic colon cancer  11. Neutropenia secondary to chemotherapy -Neulasta added with cycle 6 FOLFIRI 01/11/2016    Disposition:  [name] has metastatic colon cancer. She has clinical and CT evidence of disease progression. She has previously been treated with 5 fluorouracil, oxaliplatin, irinotecan, and Avastin-based therapies. I recommend resuming systemic therapy as soon as possible given the current symptoms and marked progression on CT. I am concerned her clinical status will decline rapidly unless there is stabilization or improvement in the metastatic tumor burden.  We discussed treatment options at length. She did not have disease progression while on FOLFIRI/Avastin. I recommend resuming treatment with FOLFIRI/Avastin. She is reluctant to begin treatment now. She would like to wait until after the holidays. We had a long discussion regarding the timing of treatment and other treatment options. We discussed Hospice care.  She  decided that she would agree to treatment with FOLFIRI/Avastin during the week of 09/11/2016. She did not wish to review the CT images. I reviewed the images after I saw her today. There is a large tumor burden in the lungs and liver. We plan to contact her by telephone and recommend treatment at a sooner date.  Approximately 45 minutes were spent with the patient today. The majority that time was used for counseling and coordination of care.  Betsy Coder, MD  08/23/2016  3:12 PM

## 2016-08-24 ENCOUNTER — Telehealth: Payer: Self-pay | Admitting: *Deleted

## 2016-08-24 NOTE — Telephone Encounter (Signed)
Oncology Nurse Navigator Documentation  Oncology Nurse Navigator Flowsheets 08/24/2016  Navigator Location [facility]  Navigator Encounter Type Telephone  Telephone Outgoing Call;Patient Update  Treatment Initiated Date -  Patient Visit Type -  Treatment Phase -  Barriers/Navigation Needs Education--at request of Dr. Benay Spice called her and discussed that her CT scan shows significant disease in her chest and MD is concerned about waiting until 1/3 to resume her treatment. Strongly encourages her to start next week.  Education -  Interventions Education  Referrals -  Coordination of Care -  Education Method Verbal  Support Groups/Services -  Specialty Items/DME -  Acuity -  Time Spent with Patient 15  Time Spent with Patient (Retired) -  Goldye reports she coughed up some blood this morning a few times, and not it has stopped. Asking what caused this--informed her it could be sinus drainage or a tumor in her lung bleeding. MD has been notified. She still declines to start chemo until 09/14/15. She will continue to "think about it" and call if she changes her mind.

## 2016-08-28 ENCOUNTER — Telehealth: Payer: Self-pay | Admitting: *Deleted

## 2016-08-28 NOTE — Telephone Encounter (Signed)
Oncology Nurse Navigator Documentation  Oncology Nurse Navigator Flowsheets 08/28/2016  Navigator Location [facility]  Navigator Encounter Type Telephone  Telephone Incoming Call  Treatment Initiated Date -  Patient Visit Type -  Treatment Phase -  Barriers/Navigation Needs -Reporting to nurse she has terrible energy-can only work in house few minutes and then needs to rest. Asking when Dr. Benay Spice will be back in office. Asking nurse to try to predict what her response to chemo will be. Voices she still wants to wait till 1/3 to start treatment.  Education -  Interventions -Informed her that no one can predict her response to chemo. RN expresses concern that she may be too weak to take the chemo by 1/3 if she waits, based on how she is today. Suggested she continue to think about starting early. MD can enter her orders in computer remotely if necessary.   Referrals -  Coordination of Care -  Education Method -  Support Groups/Services -  Specialty Items/DME -  Acuity -  Time Spent with Patient 15  Time Spent with Patient (Retired) -

## 2016-09-11 ENCOUNTER — Other Ambulatory Visit: Payer: Self-pay | Admitting: Oncology

## 2016-09-13 ENCOUNTER — Other Ambulatory Visit: Payer: Self-pay

## 2016-09-13 ENCOUNTER — Ambulatory Visit: Payer: Self-pay

## 2016-09-13 ENCOUNTER — Ambulatory Visit: Payer: Self-pay | Admitting: Nurse Practitioner

## 2016-09-18 ENCOUNTER — Telehealth: Payer: Self-pay | Admitting: *Deleted

## 2016-09-18 NOTE — Telephone Encounter (Deleted)
Message received from patient requesting a call back regarding pain she is having under her right breast to her back.

## 2016-09-18 NOTE — Telephone Encounter (Signed)
Call placed back to patient and patient states that she is having intermittent pain under her right breast with coughing.  She states that she is taking Aspirin which relieves the pain.  She is refusing to take prescribed Ultram at this time, d/t it makes her too sleepy.  Dr. Benay Spice notified.   Patient states that she spoke with her insurance company today and they stated that it could take 3-5 business days to get her [employer] approved.  Patient states that she will call [facility] back on Wednesday with update.

## 2016-09-19 NOTE — Telephone Encounter (Signed)
Message received from patient requesting a call back regarding pain she is having under her right breast to her back.

## 2016-09-20 ENCOUNTER — Encounter: Payer: Self-pay | Admitting: *Deleted

## 2016-09-20 NOTE — Progress Notes (Signed)
Order received from Dr. Benay Spice for pt to receive chemotherapy asap despite pt.'s difficulties switching her insurance to [facility] plan.  LuAnn Chrismon notified and will watch for patient's insurance to become active to get proper authorization.  Message sent to scheduling.

## 2016-09-21 ENCOUNTER — Telehealth: Payer: Self-pay | Admitting: *Deleted

## 2016-09-21 NOTE — Telephone Encounter (Signed)
Seana called to report her [employer] with [employer] through [place] is now in effect. She confirmed this today by calling them at [phone]. Tells RN her ID # [id number] She is requesting to get her chemo scheduled as soon as possible next week-she is very symptomatic with shortness of breath with any activity and coughing with occasional blood. Forwarded this message to managed care (Darlena and LuAnn).

## 2016-09-22 ENCOUNTER — Telehealth: Payer: Self-pay | Admitting: Oncology

## 2016-09-22 NOTE — Telephone Encounter (Signed)
lvm to inform pt of 1/15 appts per LOS.

## 2016-09-24 ENCOUNTER — Other Ambulatory Visit: Payer: Self-pay | Admitting: Oncology

## 2016-09-25 ENCOUNTER — Other Ambulatory Visit: Payer: Self-pay | Admitting: *Deleted

## 2016-09-25 ENCOUNTER — Ambulatory Visit (HOSPITAL_BASED_OUTPATIENT_CLINIC_OR_DEPARTMENT_OTHER): Payer: 59

## 2016-09-25 ENCOUNTER — Other Ambulatory Visit: Payer: Self-pay | Admitting: Nurse Practitioner

## 2016-09-25 ENCOUNTER — Encounter: Payer: Self-pay | Admitting: *Deleted

## 2016-09-25 ENCOUNTER — Other Ambulatory Visit (HOSPITAL_BASED_OUTPATIENT_CLINIC_OR_DEPARTMENT_OTHER): Payer: 59

## 2016-09-25 VITALS — BP 100/65 | HR 82 | Temp 98.5°F | Resp 20

## 2016-09-25 DIAGNOSIS — C180 Malignant neoplasm of cecum: Secondary | ICD-10-CM

## 2016-09-25 DIAGNOSIS — Z5111 Encounter for antineoplastic chemotherapy: Secondary | ICD-10-CM

## 2016-09-25 DIAGNOSIS — C786 Secondary malignant neoplasm of retroperitoneum and peritoneum: Secondary | ICD-10-CM

## 2016-09-25 DIAGNOSIS — C7802 Secondary malignant neoplasm of left lung: Secondary | ICD-10-CM | POA: Diagnosis not present

## 2016-09-25 DIAGNOSIS — C182 Malignant neoplasm of ascending colon: Secondary | ICD-10-CM

## 2016-09-25 DIAGNOSIS — Z5112 Encounter for antineoplastic immunotherapy: Secondary | ICD-10-CM

## 2016-09-25 DIAGNOSIS — C787 Secondary malignant neoplasm of liver and intrahepatic bile duct: Secondary | ICD-10-CM

## 2016-09-25 DIAGNOSIS — C7800 Secondary malignant neoplasm of unspecified lung: Secondary | ICD-10-CM

## 2016-09-25 DIAGNOSIS — C801 Malignant (primary) neoplasm, unspecified: Secondary | ICD-10-CM

## 2016-09-25 DIAGNOSIS — C7801 Secondary malignant neoplasm of right lung: Secondary | ICD-10-CM

## 2016-09-25 LAB — CBC WITH DIFFERENTIAL/PLATELET
BASO%: 0.7 % (ref 0.0–2.0)
BASOS ABS: 0.1 10*3/uL (ref 0.0–0.1)
EOS ABS: 0.1 10*3/uL (ref 0.0–0.5)
EOS%: 0.4 % (ref 0.0–7.0)
HCT: 28.7 % — ABNORMAL LOW (ref 34.8–46.6)
HGB: 9.4 g/dL — ABNORMAL LOW (ref 11.6–15.9)
LYMPH%: 7.4 % — AB (ref 14.0–49.7)
MCH: 27.1 pg (ref 25.1–34.0)
MCHC: 32.8 g/dL (ref 31.5–36.0)
MCV: 82.6 fL (ref 79.5–101.0)
MONO#: 1.1 10*3/uL — ABNORMAL HIGH (ref 0.1–0.9)
MONO%: 6.7 % (ref 0.0–14.0)
NEUT#: 13.4 10*3/uL — ABNORMAL HIGH (ref 1.5–6.5)
NEUT%: 84.8 % — ABNORMAL HIGH (ref 38.4–76.8)
Platelets: 422 10*3/uL — ABNORMAL HIGH (ref 145–400)
RBC: 3.48 10*6/uL — AB (ref 3.70–5.45)
RDW: 15.8 % — ABNORMAL HIGH (ref 11.2–14.5)
WBC: 15.8 10*3/uL — ABNORMAL HIGH (ref 3.9–10.3)
lymph#: 1.2 10*3/uL (ref 0.9–3.3)

## 2016-09-25 LAB — COMPREHENSIVE METABOLIC PANEL
ALK PHOS: 230 U/L — AB (ref 40–150)
ALT: 43 U/L (ref 0–55)
AST: 68 U/L — ABNORMAL HIGH (ref 5–34)
Albumin: 2.6 g/dL — ABNORMAL LOW (ref 3.5–5.0)
Anion Gap: 13 mEq/L — ABNORMAL HIGH (ref 3–11)
BUN: 8.7 mg/dL (ref 7.0–26.0)
CO2: 23 meq/L (ref 22–29)
Calcium: 9.3 mg/dL (ref 8.4–10.4)
Chloride: 94 mEq/L — ABNORMAL LOW (ref 98–109)
Creatinine: 0.7 mg/dL (ref 0.6–1.1)
GLUCOSE: 103 mg/dL (ref 70–140)
POTASSIUM: 4.2 meq/L (ref 3.5–5.1)
SODIUM: 129 meq/L — AB (ref 136–145)
Total Bilirubin: 0.83 mg/dL (ref 0.20–1.20)
Total Protein: 6 g/dL — ABNORMAL LOW (ref 6.4–8.3)

## 2016-09-25 LAB — UA PROTEIN, DIPSTICK - CHCC: Protein, ur: <30 mg/dL

## 2016-09-25 LAB — CEA (IN HOUSE-CHCC): CEA (CHCC-In House): 92.23 ng/mL — ABNORMAL HIGH (ref 0.00–5.00)

## 2016-09-25 MED ORDER — ATROPINE SULFATE 1 MG/ML IJ SOLN
INTRAMUSCULAR | Status: AC
  Filled 2016-09-25: qty 1

## 2016-09-25 MED ORDER — PALONOSETRON HCL INJECTION 0.25 MG/5ML
INTRAVENOUS | Status: AC
  Filled 2016-09-25: qty 5

## 2016-09-25 MED ORDER — DEXTROSE 5 % IV SOLN
300.0000 mg/m2 | Freq: Once | INTRAVENOUS | Status: AC
  Administered 2016-09-25: 544 mg via INTRAVENOUS
  Filled 2016-09-25: qty 27.2

## 2016-09-25 MED ORDER — ATROPINE SULFATE 1 MG/ML IJ SOLN
0.5000 mg | Freq: Once | INTRAMUSCULAR | Status: AC | PRN
  Administered 2016-09-25: 0.5 mg via INTRAVENOUS

## 2016-09-25 MED ORDER — DEXTROSE 5 % IV SOLN
171.0000 mg/m2 | Freq: Once | INTRAVENOUS | Status: AC
  Administered 2016-09-25: 300 mg via INTRAVENOUS
  Filled 2016-09-25: qty 15

## 2016-09-25 MED ORDER — SODIUM CHLORIDE 0.9% FLUSH
10.0000 mL | INTRAVENOUS | Status: DC | PRN
  Filled 2016-09-25: qty 10

## 2016-09-25 MED ORDER — SODIUM CHLORIDE 0.9 % IV SOLN
Freq: Once | INTRAVENOUS | Status: AC
  Administered 2016-09-25: 13:00:00 via INTRAVENOUS
  Filled 2016-09-25: qty 5

## 2016-09-25 MED ORDER — SODIUM CHLORIDE 0.9 % IV SOLN: INTRAVENOUS | Status: AC

## 2016-09-25 MED ORDER — DIPHENHYDRAMINE HCL 50 MG/ML IJ SOLN
INTRAMUSCULAR | Status: AC
  Filled 2016-09-25: qty 1

## 2016-09-25 MED ORDER — FAMOTIDINE IN NACL 20-0.9 MG/50ML-% IV SOLN
20.0000 mg | Freq: Once | INTRAVENOUS | Status: AC
  Administered 2016-09-25: 20 mg via INTRAVENOUS

## 2016-09-25 MED ORDER — SODIUM CHLORIDE 0.9 % IV SOLN
Freq: Once | INTRAVENOUS | Status: AC
  Administered 2016-09-25: 11:00:00 via INTRAVENOUS

## 2016-09-25 MED ORDER — DIPHENHYDRAMINE HCL 50 MG/ML IJ SOLN
25.0000 mg | Freq: Once | INTRAMUSCULAR | Status: AC
  Administered 2016-09-25: 25 mg via INTRAVENOUS

## 2016-09-25 MED ORDER — FAMOTIDINE IN NACL 20-0.9 MG/50ML-% IV SOLN
INTRAVENOUS | Status: AC
  Filled 2016-09-25: qty 50

## 2016-09-25 MED ORDER — PALONOSETRON HCL INJECTION 0.25 MG/5ML
0.2500 mg | Freq: Once | INTRAVENOUS | Status: AC
  Administered 2016-09-25: 0.25 mg via INTRAVENOUS

## 2016-09-25 MED ORDER — SODIUM CHLORIDE 0.9 % IV SOLN
5.0000 mg/kg | Freq: Once | INTRAVENOUS | Status: AC
  Administered 2016-09-25: 325 mg via INTRAVENOUS
  Filled 2016-09-25: qty 13

## 2016-09-25 MED ORDER — FLUOROURACIL CHEMO INJECTION 2.5 GM/50ML
300.0000 mg/m2 | Freq: Once | INTRAVENOUS | Status: AC
  Administered 2016-09-25: 550 mg via INTRAVENOUS
  Filled 2016-09-25: qty 11

## 2016-09-25 MED ORDER — HEPARIN SOD (PORK) LOCK FLUSH 100 UNIT/ML IV SOLN
500.0000 [IU] | Freq: Once | INTRAVENOUS | Status: DC | PRN
  Filled 2016-09-25: qty 5

## 2016-09-25 MED ORDER — SODIUM CHLORIDE 0.9 % IV SOLN
1800.0000 mg/m2 | INTRAVENOUS | Status: DC
  Administered 2016-09-25: 3250 mg via INTRAVENOUS
  Filled 2016-09-25: qty 65

## 2016-09-25 NOTE — Patient Instructions (Signed)
[facility] Discharge Instructions for Patients Receiving Chemotherapy  Today you received the following chemotherapy agents:  Leucovorin, 5FU, irinotecan, Avastin  To help prevent nausea and vomiting after your treatment, we encourage you to take your nausea medication.   If you develop nausea and vomiting that is not controlled by your nausea medication, call the clinic.   BELOW ARE SYMPTOMS THAT SHOULD BE REPORTED IMMEDIATELY:  *FEVER GREATER THAN 100.5 F  *CHILLS WITH OR WITHOUT FEVER  NAUSEA AND VOMITING THAT IS NOT CONTROLLED WITH YOUR NAUSEA MEDICATION  *UNUSUAL SHORTNESS OF BREATH  *UNUSUAL BRUISING OR BLEEDING  TENDERNESS IN MOUTH AND THROAT WITH OR WITHOUT PRESENCE OF ULCERS  *URINARY PROBLEMS  *BOWEL PROBLEMS  UNUSUAL RASH Items with * indicate a potential emergency and should be followed up as soon as possible.  Feel free to call the clinic you have any questions or concerns. The clinic phone number is [phone].  Please show the [facility] at check-in to the Emergency Department and triage nurse.

## 2016-09-25 NOTE — Progress Notes (Signed)
Martha Becker was seen in the infusion area. She reports worsening cough and progressive dyspnea over the past several weeks. Energy level is poor. She denies nausea/vomiting. No diarrhea.  Physical examination-temperature 98.5, heart rate 82, respirations 20, blood pressure 100/65 Lungs clear bilaterally. Increased respiratory rate. Regular rate and rhythm. Abdomen soft. No hepatomegaly. No leg edema. Alert and oriented.  Plan to proceed with cycle 1 FOLFIRI/Avastin today as scheduled. She will return for a follow-up visit and cycle 2 in 2 weeks. She understands to contact the office in the interim with new symptoms or worsening of current symptoms.

## 2016-09-25 NOTE — Progress Notes (Signed)
Per Dr. Benay Spice, okay to change date of avastin if pt able to provide urine sample. Adding 545mL of NS to replace Na 129 and Cl 94.

## 2016-09-25 NOTE — Progress Notes (Signed)
Oncology Nurse Navigator Documentation  Oncology Nurse Navigator Flowsheets 09/25/2016  Navigator Location [facility]  Telephone -  Treatment Initiated Date -  Patient Visit Type MedOnc  Treatment Phase Active Tx--FOLFIRI  Barriers/Navigation Needs Family concerns--worried about needing to purchase medicare after 29 months on [place], hopeful the chemo will work and she will get more time  Education Coping with Diagnosis/ Prognosis  Interventions Other--supportive listening; reminded her not to worry about something that is 29 months away and focus on here and now and enjoying life; encouraged her to ask her sister and brother for help when she needs it.  Referrals -  Coordination of Care -  Education Method -  Support Groups/Services -  Specialty Items/DME -  Acuity -  Time Spent with Patient 15  Time Spent with Patient (Retired) -

## 2016-09-26 ENCOUNTER — Telehealth: Payer: Self-pay | Admitting: Oncology

## 2016-09-26 NOTE — Telephone Encounter (Signed)
lvm to inform pt of 1/29 appt dates/trimes per LOS

## 2016-09-27 ENCOUNTER — Telehealth: Payer: Self-pay | Admitting: Oncology

## 2016-09-27 ENCOUNTER — Telehealth: Payer: Self-pay | Admitting: *Deleted

## 2016-09-27 NOTE — Telephone Encounter (Signed)
Patient called in re not being able to make in for pump d/c. Patient transferred to desk nurse for instructions.

## 2016-09-27 NOTE — Telephone Encounter (Signed)
Please be sure she is scheduled for next folfiri/avastin and office same day

## 2016-09-27 NOTE — Telephone Encounter (Signed)
Call received from patient stating that d/t the weather, she is unable to make it to [facility] for her pump d/c today and does not think that she will be able to make it here until Friday, 09/29/16.  Patient states that she knows how to turn off pump and that her dressing around her port is intact.  Patient instructed to call [facility] when she is able to get here for pump d/c and we will get her scheduled ASAP.  Patient informed that she can go to [facility] for pump d/c, but does not want to go there and prefers [place].  Patient appreciative of information and has no further questions at this time.

## 2016-09-28 ENCOUNTER — Other Ambulatory Visit: Payer: Self-pay | Admitting: *Deleted

## 2016-09-28 DIAGNOSIS — C180 Malignant neoplasm of cecum: Secondary | ICD-10-CM

## 2016-09-29 ENCOUNTER — Telehealth: Payer: Self-pay | Admitting: *Deleted

## 2016-09-29 ENCOUNTER — Ambulatory Visit (HOSPITAL_BASED_OUTPATIENT_CLINIC_OR_DEPARTMENT_OTHER): Payer: 59

## 2016-09-29 VITALS — BP 92/64 | HR 83 | Temp 99.2°F | Resp 20

## 2016-09-29 DIAGNOSIS — C182 Malignant neoplasm of ascending colon: Secondary | ICD-10-CM

## 2016-09-29 DIAGNOSIS — Z5189 Encounter for other specified aftercare: Secondary | ICD-10-CM

## 2016-09-29 DIAGNOSIS — C180 Malignant neoplasm of cecum: Secondary | ICD-10-CM

## 2016-09-29 MED ORDER — HEPARIN SOD (PORK) LOCK FLUSH 100 UNIT/ML IV SOLN
500.0000 [IU] | Freq: Once | INTRAVENOUS | Status: AC | PRN
  Administered 2016-09-29: 500 [IU]
  Filled 2016-09-29: qty 5

## 2016-09-29 MED ORDER — PEGFILGRASTIM INJECTION 6 MG/0.6ML ~~LOC~~
6.0000 mg | PREFILLED_SYRINGE | Freq: Once | SUBCUTANEOUS | Status: AC
  Administered 2016-09-29: 6 mg via SUBCUTANEOUS
  Filled 2016-09-29: qty 0.6

## 2016-09-29 MED ORDER — SODIUM CHLORIDE 0.9% FLUSH
10.0000 mL | INTRAVENOUS | Status: DC | PRN
  Administered 2016-09-29: 10 mL
  Filled 2016-09-29: qty 10

## 2016-09-29 NOTE — Telephone Encounter (Signed)
Call from pt reporting she will be here around 1330 for pump disconnect. Scheduler made aware.

## 2016-10-03 ENCOUNTER — Observation Stay (HOSPITAL_COMMUNITY)
Admission: EM | Admit: 2016-10-03 | Discharge: 2016-10-05 | Disposition: A | Discharge status: Home / Self Care | Payer: 59 | Attending: Internal Medicine | Admitting: Internal Medicine

## 2016-10-03 ENCOUNTER — Encounter (HOSPITAL_COMMUNITY): Payer: Self-pay

## 2016-10-03 ENCOUNTER — Telehealth: Payer: Self-pay

## 2016-10-03 DIAGNOSIS — R5383 Other fatigue: Secondary | ICD-10-CM | POA: Diagnosis not present

## 2016-10-03 DIAGNOSIS — Z9049 Acquired absence of other specified parts of digestive tract: Secondary | ICD-10-CM | POA: Diagnosis not present

## 2016-10-03 DIAGNOSIS — R531 Weakness: Secondary | ICD-10-CM | POA: Diagnosis not present

## 2016-10-03 DIAGNOSIS — E860 Dehydration: Secondary | ICD-10-CM | POA: Insufficient documentation

## 2016-10-03 DIAGNOSIS — C180 Malignant neoplasm of cecum: Secondary | ICD-10-CM | POA: Diagnosis present

## 2016-10-03 DIAGNOSIS — Z87891 Personal history of nicotine dependence: Secondary | ICD-10-CM | POA: Diagnosis not present

## 2016-10-03 DIAGNOSIS — C786 Secondary malignant neoplasm of retroperitoneum and peritoneum: Secondary | ICD-10-CM | POA: Diagnosis not present

## 2016-10-03 DIAGNOSIS — E876 Hypokalemia: Secondary | ICD-10-CM | POA: Diagnosis not present

## 2016-10-03 DIAGNOSIS — C801 Malignant (primary) neoplasm, unspecified: Secondary | ICD-10-CM

## 2016-10-03 DIAGNOSIS — Z9221 Personal history of antineoplastic chemotherapy: Secondary | ICD-10-CM | POA: Insufficient documentation

## 2016-10-03 DIAGNOSIS — Z885 Allergy status to narcotic agent status: Secondary | ICD-10-CM | POA: Insufficient documentation

## 2016-10-03 DIAGNOSIS — C7800 Secondary malignant neoplasm of unspecified lung: Secondary | ICD-10-CM | POA: Diagnosis not present

## 2016-10-03 DIAGNOSIS — K529 Noninfective gastroenteritis and colitis, unspecified: Secondary | ICD-10-CM | POA: Diagnosis not present

## 2016-10-03 DIAGNOSIS — M6281 Muscle weakness (generalized): Secondary | ICD-10-CM

## 2016-10-03 DIAGNOSIS — R2689 Other abnormalities of gait and mobility: Secondary | ICD-10-CM

## 2016-10-03 DIAGNOSIS — E871 Hypo-osmolality and hyponatremia: Principal | ICD-10-CM | POA: Insufficient documentation

## 2016-10-03 DIAGNOSIS — C787 Secondary malignant neoplasm of liver and intrahepatic bile duct: Secondary | ICD-10-CM | POA: Diagnosis not present

## 2016-10-03 DIAGNOSIS — Z79899 Other long term (current) drug therapy: Secondary | ICD-10-CM | POA: Diagnosis not present

## 2016-10-03 DIAGNOSIS — D638 Anemia in other chronic diseases classified elsewhere: Secondary | ICD-10-CM | POA: Diagnosis not present

## 2016-10-03 DIAGNOSIS — Z800 Family history of malignant neoplasm of digestive organs: Secondary | ICD-10-CM | POA: Diagnosis not present

## 2016-10-03 LAB — BASIC METABOLIC PANEL
Anion gap: 10 (ref 5–15)
BUN: 9 mg/dL (ref 6–20)
CALCIUM: 8.6 mg/dL — AB (ref 8.9–10.3)
CHLORIDE: 91 mmol/L — AB (ref 101–111)
CO2: 22 mmol/L (ref 22–32)
CREATININE: 0.61 mg/dL (ref 0.44–1.00)
GFR calc Af Amer: >60 mL/min (ref >60)
GFR calc non Af Amer: >60 mL/min (ref >60)
GLUCOSE: 106 mg/dL — AB (ref 65–99)
Potassium: 4 mmol/L (ref 3.5–5.1)
Sodium: 123 mmol/L — ABNORMAL LOW (ref 135–145)

## 2016-10-03 LAB — CBC
HCT: 25.8 % — ABNORMAL LOW (ref 36.0–46.0)
Hemoglobin: 9 g/dL — ABNORMAL LOW (ref 12.0–15.0)
MCH: 27.7 pg (ref 26.0–34.0)
MCHC: 34.9 g/dL (ref 30.0–36.0)
MCV: 79.4 fL (ref 78.0–100.0)
PLATELETS: 202 10*3/uL (ref 150–400)
RBC: 3.25 MIL/uL — AB (ref 3.87–5.11)
RDW: 15.3 % (ref 11.5–15.5)
WBC: 4.1 10*3/uL (ref 4.0–10.5)

## 2016-10-03 LAB — HEPATIC FUNCTION PANEL
ALK PHOS: 235 U/L — AB (ref 38–126)
ALT: 84 U/L — AB (ref 14–54)
AST: 123 U/L — ABNORMAL HIGH (ref 15–41)
Albumin: 2.5 g/dL — ABNORMAL LOW (ref 3.5–5.0)
BILIRUBIN DIRECT: 0.2 mg/dL (ref 0.1–0.5)
BILIRUBIN INDIRECT: 0.2 mg/dL — AB (ref 0.3–0.9)
TOTAL PROTEIN: 5.8 g/dL — AB (ref 6.5–8.1)
Total Bilirubin: 0.4 mg/dL (ref 0.3–1.2)

## 2016-10-03 LAB — LIPASE, BLOOD: LIPASE: 21 U/L (ref 11–51)

## 2016-10-03 MED ORDER — SODIUM CHLORIDE 0.9 % IV SOLN
INTRAVENOUS | Status: DC
  Administered 2016-10-03: 1000 mL via INTRAVENOUS

## 2016-10-03 MED ORDER — SODIUM CHLORIDE 0.9 % IV BOLUS (SEPSIS)
500.0000 mL | Freq: Once | INTRAVENOUS | Status: AC
  Administered 2016-10-03: 500 mL via INTRAVENOUS

## 2016-10-03 MED ORDER — SODIUM CHLORIDE 0.9 % IV BOLUS (SEPSIS)
1000.0000 mL | Freq: Once | INTRAVENOUS | Status: AC
  Administered 2016-10-03: 1000 mL via INTRAVENOUS

## 2016-10-03 NOTE — ED Notes (Signed)
Pt informed of delay & possible bed shortage at [facility] here. Pt says she still wants to go to [facility] where her Dr is as soon as a bed comes available.

## 2016-10-03 NOTE — H&P (Signed)
History and Physical    Acadia Pahls [id number] DOB: 1955-12-28 DOA: 10/03/2016  PCP: Selinda Flavin, MD  Patient coming from: home  Chief Complaint:  weakness  HPI: Martha Becker is a 61 y.o. female with medical history significant of colon cancer with metastatic disease on chemo on/off for over 2 years comes in with generalized weakness and says "i cant do this anymore".  Pt is single and lives alone and has minimal help at home.  She has one sister who is disabled.  Pt reports she took a break from her chemo treatments in august of 2017, because she didn't want to be sick during the holidays.  She was very weak and overall at low functional status for herself then.  She said she recovered some strength during the holidays but not much.  Her oncologist encouraged her to start chemo again, as during this time her disease had advanced.  She started again this month and she says she cant handle it.  She is scheduled again on Monday.  She reports she get so weak she can barely get out of bed for days after her chemo is administered.  She comes in today with overall weakness.  She denies fevers.  She denies nausea and vomiting.  She has had some diarrhea.  She has had no nasal congestioin, cough or sob.  Her oncologist has mentioned to her hospice, but the plan was to try 3 months of chemo in jan/feb/march and reassess after that.  Pt feels like she cant do it.  She is referred for admission for hyponatremia and dehydration.  She has not HH/PT at home arranged.  Review of Systems: As per HPI otherwise 10 point review of systems negative.   Past Medical History:  Diagnosis Date  . Colon cancer (HCC) 08/2014   with mets to liver  . Hypothyroidism     Past Surgical History:  Procedure Laterality Date  . CESAREAN SECTION    . LIVER BIOPSY N/A 08/31/2014   Procedure: LIVER BIOPSY;  Surgeon: Dalia Heading, MD;  Location: [facility];  Service: General;  Laterality: N/A;  . PARTIAL COLECTOMY N/A  08/31/2014   Procedure: Right Colon Resection ;  Surgeon: Dalia Heading, MD;  Location: [facility];  Service: General;  Laterality: N/A;  . PORTACATH PLACEMENT Right 11/30/2014   Procedure: INSERTION PORT-A-CATH right subclavian;  Surgeon: Franky Macho Md, MD;  Location: [facility];  Service: General;  Laterality: Right;     reports that she quit smoking about 22 years ago. Her smoking use included Cigarettes. She has a 27.00 pack-year smoking history. She has never used smokeless tobacco. She reports that she does not drink alcohol or use drugs.  Allergies  Allergen Reactions  . Codeine Nausea And Vomiting    Family History  Problem Relation Age of Onset  . Colon cancer Cousin     age late 3s  . Inflammatory bowel disease Neg Hx     Prior to Admission medications   Medication Sig Start Date End Date Taking? Authorizing Provider  Ascorbic Acid (VITAMIN C) 1000 MG tablet Take 1,000 mg by mouth every other day.   Yes Historical Provider, MD  diphenoxylate-atropine (LOMOTIL) 2.5-0.025 MG tablet Take 1 tablet by mouth 4 (four) times daily as needed for diarrhea or loose stools.   Yes Historical Provider, MD  Fluorouracil (ADRUCIL IV) Inject into the vein as directed.   Yes Historical Provider, MD  fosaprepitant 150 mg, dexamethasone 12 mg in sodium chloride 0.9 %  145 mL Inject into the vein as directed.   Yes Historical Provider, MD  Irinotecan HCl (CAMPTOSAR IV) Inject into the vein as directed. 300mg  in Dextrose 5% ( Chemo infusion)   Yes Historical Provider, MD  lidocaine-prilocaine (EMLA) cream Apply to port site one hour prior to use. Do not rub in. Cover with plastic. Patient taking differently: Apply 1 application topically See admin instructions. Apply to port site one hour prior to use. Do not rub in. Cover with plastic. 11/27/14  Yes Ladene Artist, MD  loperamide (IMODIUM) 2 MG capsule Take 2-4 mg by mouth as needed for diarrhea or loose stools.   Yes Historical Provider, MD    loratadine (CLARITIN) 10 MG tablet Take 10 mg by mouth daily.   Yes Historical Provider, MD  ondansetron (ZOFRAN) 4 MG tablet Take 1 tablet (4 mg total) by mouth every 6 (six) hours as needed for nausea or vomiting. 07/18/16  Yes Ladene Artist, MD  prochlorperazine (COMPAZINE) 10 MG tablet Take 1 tablet (10 mg total) by mouth every 6 (six) hours as needed for nausea or vomiting. 11/06/14  Yes Ladene Artist, MD  senna (SENOKOT) 8.6 MG tablet Take 2 tablets by mouth daily.    Yes Historical Provider, MD  traMADol (ULTRAM) 50 MG tablet Take 1 tablet (50 mg total) by mouth every 6 (six) hours as needed. Patient taking differently: Take 25-50 mg by mouth every 6 (six) hours as needed.  09/08/15  Yes Ladene Artist, MD  Turmeric 500 MG CAPS Take by mouth 2 (two) times daily. Takes once a day at times   Yes Historical Provider, MD    Physical Exam: Vitals:   10/03/16 1543 10/03/16 1830 10/03/16 1856 10/03/16 1945  BP: 92/58 108/69  103/69  Pulse: 92 83  91  Resp: 18 21  22   Temp: 98.8 F (37.1 C)  98.3 F (36.8 C)   TempSrc:   Oral   SpO2: 96% 94%  97%  Weight:      Height:        Constitutional: NAD, calm, comfortable Vitals:   10/03/16 1543 10/03/16 1830 10/03/16 1856 10/03/16 1945  BP: 92/58 108/69  103/69  Pulse: 92 83  91  Resp: 18 21  22   Temp: 98.8 F (37.1 C)  98.3 F (36.8 C)   TempSrc:   Oral   SpO2: 96% 94%  97%  Weight:      Height:       Eyes: PERRL, lids and conjunctivae normal ENMT: Mucous membranes are dry. Posterior pharynx clear of any exudate or lesions.Normal dentition.  Neck: normal, supple, no masses, no thyromegaly Respiratory: clear to auscultation bilaterally, no wheezing, no crackles. Normal respiratory effort. No accessory muscle use.  Cardiovascular: Regular rate and rhythm, no murmurs / rubs / gallops. No extremity edema. 2+ pedal pulses. No carotid bruits.  Abdomen: no tenderness, no masses palpated. No hepatosplenomegaly. Bowel sounds positive.   Musculoskeletal: no clubbing / cyanosis. No joint deformity upper and lower extremities. Good ROM, no contractures. Normal muscle tone.  Skin: no rashes, lesions, ulcers. No induration Neurologic: CN 2-12 grossly intact. Sensation intact, DTR normal. Strength 5/5 in all 4.  Psychiatric: Normal judgment and insight. Alert and oriented x 3. Normal mood.    Labs on Admission: I have personally reviewed following labs and imaging studies  CBC:  Recent Labs Lab 10/03/16 1628  WBC 4.1  HGB 9.0*  HCT 25.8*  MCV 79.4  PLT 202   Basic  Metabolic Panel:  Recent Labs Lab 10/03/16 1628  NA 123*  K 4.0  CL 91*  CO2 22  GLUCOSE 106*  BUN 9  CREATININE 0.61  CALCIUM 8.6*   GFR: Estimated Creatinine Clearance: 71.8 mL/min (by C-G formula based on SCr of 0.61 mg/dL).    Assessment/Plan 61 yo female with stage 4 colon cancer comes in with generalized weakness and hyponatremia  Principal Problem:   Hyponatremia-  Ns ivf.  Given 500cc bolus in ED, will give another liter.  Provide ns overnight and repeat bmp in the am.  Check mag and phos levels.    Active Problems:   Generalized weakness- due to overall dehydration and chemo.  Supportive care   Cecal cancer (HCC)- noted   Peritoneal carcinomatosis (HCC)- noted   Lung metastasis (HCC)- noted   Anemia of chronic disease- stable   Dehydration- as above   I have had a long discussion with patient about plan of care at this time.  She mainly wants to stop chemo treatments solely because she cant do this alone at home, living alone with minimal help.  Perhaps we can get CM involved and perhaps we can get her some HH/PT arranged around the time she gets her chemo treatments to help get her through the next couple of months before considering hospice alone.  I have put in CM consult.    DVT prophylaxis: scds Code Status:  full Family Communication: none  Disposition Plan:  Per day team Consults called:   CM Admission status:   observation    Mario Voong A MD [facility]  If 7PM-7AM, please contact night-coverage [email or web address] Password [facility]  10/03/2016, 8:19 PM   Seen before midnight

## 2016-10-03 NOTE — ED Provider Notes (Addendum)
[place] DEPT Provider Note   CSN: [id number] Arrival date & time: 10/03/16  1450     History   Chief Complaint Chief Complaint  Patient presents with  . Fatigue    HPI Martha Becker is a 61 y.o. female.  Patient with known metastatic colon cancer. Status post partial colectomy chemotherapy. Recent started new round of chemotherapy 2 weeks ago. Patient has felt very wiped out from this. Several episodes of near syncope fatigue generalized weakness denies fevers abdominal pain nausea or vomiting. Patient is followed by oncology at [facility]      Past Medical History:  Diagnosis Date  . Colon cancer ([place]) 08/2014   with mets to liver  . Hypothyroidism     Patient Active Problem List   Diagnosis Date Noted  . Port catheter in place 01/10/2016  . Diarrhea   . Hypomagnesemia 11/15/2014  . Colitis 11/14/2014  . Dehydration 11/14/2014  . Hyponatremia 11/14/2014  . Hypokalemia 11/14/2014  . Hypothyroidism 09/28/2014  . Lung metastasis ([place]) 08/29/2014  . Anemia of chronic disease 08/29/2014  . SBO (small bowel obstruction) 08/29/2014  . Colonic obstruction 08/28/2014  . Nausea with vomiting 08/28/2014  . Pulmonary nodules 08/28/2014  . Cecal cancer ([place]) 08/28/2014  . Peritoneal carcinomatosis ([place]) 08/28/2014  . Abnormal CT scan, colon 08/21/2014  . RLQ abdominal pain 08/21/2014    Past Surgical History:  Procedure Laterality Date  . CESAREAN SECTION    . LIVER BIOPSY N/A 08/31/2014   Procedure: LIVER BIOPSY;  Surgeon: Jamesetta So, MD;  Location: [facility];  Service: General;  Laterality: N/A;  . PARTIAL COLECTOMY N/A 08/31/2014   Procedure: Right Colon Resection ;  Surgeon: Jamesetta So, MD;  Location: [facility];  Service: General;  Laterality: N/A;  . PORTACATH PLACEMENT Right 11/30/2014   Procedure: INSERTION PORT-A-CATH right subclavian;  Surgeon: Aviva Signs Md, MD;  Location: [facility];  Service: General;  Laterality: Right;    OB History      Gravida Para Term Preterm AB Living   2         1   SAB TAB Ectopic Multiple Live Births                   Home Medications    Prior to Admission medications   Medication Sig Start Date End Date Taking? Authorizing Provider  Ascorbic Acid (VITAMIN C) 1000 MG tablet Take 1,000 mg by mouth every other day.   Yes Historical Provider, MD  diphenoxylate-atropine (LOMOTIL) 2.5-0.025 MG tablet Take 1 tablet by mouth 4 (four) times daily as needed for diarrhea or loose stools.   Yes Historical Provider, MD  Fluorouracil (ADRUCIL IV) Inject into the vein as directed.   Yes Historical Provider, MD  fosaprepitant 150 mg, dexamethasone 12 mg in sodium chloride 0.9 % 145 mL Inject into the vein as directed.   Yes Historical Provider, MD  Irinotecan HCl (CAMPTOSAR IV) Inject into the vein as directed. 300mg  in Dextrose 5% (568ml Chemo infusion)   Yes Historical Provider, MD  lidocaine-prilocaine (EMLA) cream Apply to port site one hour prior to use. Do not rub in. Cover with plastic. Patient taking differently: Apply 1 application topically See admin instructions. Apply to port site one hour prior to use. Do not rub in. Cover with plastic. 11/27/14  Yes Ladell Pier, MD  loperamide (IMODIUM) 2 MG capsule Take 2-4 mg by mouth as needed for diarrhea or loose stools.   Yes Historical Provider, MD  loratadine (CLARITIN) 10 MG tablet Take 10 mg by mouth daily.   Yes Historical Provider, MD  ondansetron (ZOFRAN) 4 MG tablet Take 1 tablet (4 mg total) by mouth every 6 (six) hours as needed for nausea or vomiting. 07/18/16  Yes Ladell Pier, MD  prochlorperazine (COMPAZINE) 10 MG tablet Take 1 tablet (10 mg total) by mouth every 6 (six) hours as needed for nausea or vomiting. 11/06/14  Yes Ladell Pier, MD  senna (SENOKOT) 8.6 MG tablet Take 2 tablets by mouth daily.    Yes Historical Provider, MD  traMADol (ULTRAM) 50 MG tablet Take 1 tablet (50 mg total) by mouth every 6 (six) hours as needed. Patient  taking differently: Take 25-50 mg by mouth every 6 (six) hours as needed.  09/08/15  Yes Ladell Pier, MD  Turmeric 500 MG CAPS Take by mouth 2 (two) times daily. Takes once a day at times   Yes Historical Provider, MD    Family History Family History  Problem Relation Age of Onset  . Colon cancer Cousin     age late 58s  . Inflammatory bowel disease Neg Hx     Social History Social History  Substance Use Topics  . Smoking status: Former Smoker    Packs/day: 1.50    Years: 18.00    Types: Cigarettes    Quit date: 09/15/1994  . Smokeless tobacco: Never Used  . Alcohol use No     Allergies   Codeine   Review of Systems Review of Systems  Constitutional: Positive for fatigue. Negative for fever.  HENT: Negative for congestion.   Eyes: Negative for redness.  Respiratory: Negative for shortness of breath.   Cardiovascular: Negative for chest pain and leg swelling.  Gastrointestinal: Negative for abdominal pain, nausea and vomiting.  Genitourinary: Negative for dysuria.  Musculoskeletal: Negative for back pain.  Skin: Negative for rash.  Neurological: Positive for light-headedness.  Hematological: Does not bruise/bleed easily.  Psychiatric/Behavioral: Negative for confusion.     Physical Exam Updated Vital Signs BP 103/69 (BP Location: Left Arm)   Pulse 91   Temp 98.3 F (36.8 C) (Oral)   Resp 22   Ht 5\' 6"  (1.676 m)   Wt 63.5 kg   SpO2 97%   BMI 22.60 kg/m   Physical Exam  Constitutional: She is oriented to person, place, and time. She appears well-developed and well-nourished. No distress.  HENT:  Head: Normocephalic and atraumatic.  Mouth/Throat: Oropharynx is clear and moist.  Eyes: Conjunctivae and EOM are normal. Pupils are equal, round, and reactive to light.  Neck: Normal range of motion. Neck supple.  Cardiovascular: Normal rate, regular rhythm and normal heart sounds.   Pulmonary/Chest: Effort normal and breath sounds normal.  port right upper  chest  Abdominal: Soft. Bowel sounds are normal. There is no tenderness.  Musculoskeletal: Normal range of motion. She exhibits no edema.  Neurological: She is alert and oriented to person, place, and time. No cranial nerve deficit or sensory deficit. She exhibits normal muscle tone. Coordination normal.  Skin: Skin is warm.  Nursing note and vitals reviewed.    ED Treatments / Results  Labs (all labs ordered are listed, but only abnormal results are displayed) Labs Reviewed  BASIC METABOLIC PANEL - Abnormal; Notable for the following:       Result Value   Sodium 123 (*)    Chloride 91 (*)    Glucose, Bld 106 (*)    Calcium 8.6 (*)  All other components within normal limits  CBC - Abnormal; Notable for the following:    RBC 3.25 (*)    Hemoglobin 9.0 (*)    HCT 25.8 (*)    All other components within normal limits  URINALYSIS, ROUTINE W REFLEX MICROSCOPIC  HEPATIC FUNCTION PANEL  LIPASE, BLOOD   Results for orders placed or performed during the hospital encounter of [id number]  Basic metabolic panel  Result Value Ref Range   Sodium 123 (L) 135 - 145 mmol/L   Potassium 4.0 3.5 - 5.1 mmol/L   Chloride 91 (L) 101 - 111 mmol/L   CO2 22 22 - 32 mmol/L   Glucose, Bld 106 (H) 65 - 99 mg/dL   BUN 9 6 - 20 mg/dL   Creatinine, Ser 0.61 0.44 - 1.00 mg/dL   Calcium 8.6 (L) 8.9 - 10.3 mg/dL   GFR calc non Af Amer >60 >60 mL/min   GFR calc Af Amer >60 >60 mL/min   Anion gap 10 5 - 15  CBC  Result Value Ref Range   WBC 4.1 4.0 - 10.5 K/uL   RBC 3.25 (L) 3.87 - 5.11 MIL/uL   Hemoglobin 9.0 (L) 12.0 - 15.0 g/dL   HCT 25.8 (L) 36.0 - 46.0 %   MCV 79.4 78.0 - 100.0 fL   MCH 27.7 26.0 - 34.0 pg   MCHC 34.9 30.0 - 36.0 g/dL   RDW 15.3 11.5 - 15.5 %   Platelets 202 150 - 400 K/uL     EKG  EKG Interpretation None       Radiology No results found.  Procedures Procedures (including critical care time)  Medications Ordered in ED Medications  0.9 %  sodium chloride  infusion (not administered)  sodium chloride 0.9 % bolus 500 mL (not administered)     Initial Impression / Assessment and Plan / ED Course  I have reviewed the triage vital signs and the nursing notes.  Pertinent labs & imaging results that were available during my care of the patient were reviewed by me and considered in my medical decision making (see chart for details).     A patient with known metastatic colon cancer. Patient's had the partial colectomy undergone chemotherapy recently restarted the second course of chemotherapy a week ago. Since that time patient is felt exhausted fatigue had near syncope no energy to do anything. Patient is followed by oncology at [facility].  Today's lab workup shows significant hyponatremia. Patient would benefit from IV fluid hydration and admission. Suspect the hospital is full and admit her at [facility] long if beds are available. We'll discuss with hospitalist here.  Added on liver function tests for completeness.  Final Clinical Impressions(s) / ED Diagnoses   Final diagnoses:  Fatigue, unspecified type  Hyponatremia    New Prescriptions New Prescriptions   No medications on file     Fredia Sorrow, MD 10/03/16 2003    Fredia Sorrow, MD 10/03/16 2006  Correction patient's last chemotherapy was on the 15th.    Fredia Sorrow, MD 10/03/16 2013

## 2016-10-03 NOTE — ED Triage Notes (Signed)
Pt is currently undergoing treatment for colon cancer. Pt received treatment last Monday and has been increasingly weak. Pt has been having loose stools.

## 2016-10-03 NOTE — Telephone Encounter (Signed)
Pt called stating she has no energy. "I knew this was going to happen". She had folfiri on 1/15, pump dc and neulasta on 1/17. She states she has been in bed since Monday. She can barely make it to the bathroom. She cannot make it to the kitchen to get food. She had one chalky stool today, she has bleeding hemorroid. She drank about 2-3 bottles of water yesterday. She has a cough she says Dr Benay Spice is aware of, occassionally productive, not worse. She says she is not sure she can get up and get dressed and make it to the vehicle to come in to [facility] Monday 1/29. She does have people come to the house and help her, but not there 24/7.  S/w Dr Benay Spice and pt can come to [facility] or go to ER. Pt stated she needed time to think about it, her sister is at her house trying to convince her to be seen. Told pt to call us back and let us know her decision. She was given a 15 minute time frame.   Called pt and her sister is afraid she will fall, she is going to go to [employer].

## 2016-10-04 DIAGNOSIS — C180 Malignant neoplasm of cecum: Secondary | ICD-10-CM | POA: Diagnosis not present

## 2016-10-04 DIAGNOSIS — E871 Hypo-osmolality and hyponatremia: Secondary | ICD-10-CM | POA: Diagnosis not present

## 2016-10-04 LAB — BASIC METABOLIC PANEL
ANION GAP: 8 (ref 5–15)
BUN: 8 mg/dL (ref 6–20)
CALCIUM: 7.9 mg/dL — AB (ref 8.9–10.3)
CO2: 21 mmol/L — AB (ref 22–32)
Chloride: 96 mmol/L — ABNORMAL LOW (ref 101–111)
Creatinine, Ser: 0.5 mg/dL (ref 0.44–1.00)
Glucose, Bld: 106 mg/dL — ABNORMAL HIGH (ref 65–99)
Potassium: 3.9 mmol/L (ref 3.5–5.1)
SODIUM: 125 mmol/L — AB (ref 135–145)

## 2016-10-04 LAB — URINALYSIS, ROUTINE W REFLEX MICROSCOPIC
BILIRUBIN URINE: NEGATIVE
Bacteria, UA: NONE SEEN
Glucose, UA: NEGATIVE mg/dL
KETONES UR: NEGATIVE mg/dL
NITRITE: NEGATIVE
PROTEIN: NEGATIVE mg/dL
Specific Gravity, Urine: 1.014 (ref 1.005–1.030)
pH: 5 (ref 5.0–8.0)

## 2016-10-04 LAB — CBC
HCT: 23.3 % — ABNORMAL LOW (ref 36.0–46.0)
Hemoglobin: 7.9 g/dL — ABNORMAL LOW (ref 12.0–15.0)
MCH: 27.1 pg (ref 26.0–34.0)
MCHC: 33.9 g/dL (ref 30.0–36.0)
MCV: 79.8 fL (ref 78.0–100.0)
Platelets: 189 10*3/uL (ref 150–400)
RBC: 2.92 MIL/uL — AB (ref 3.87–5.11)
RDW: 15.7 % — ABNORMAL HIGH (ref 11.5–15.5)
WBC: 3.6 10*3/uL — AB (ref 4.0–10.5)

## 2016-10-04 LAB — VITAMIN B12: VITAMIN B 12: 1426 pg/mL — AB (ref 180–914)

## 2016-10-04 LAB — PHOSPHORUS: PHOSPHORUS: 3.1 mg/dL (ref 2.5–4.6)

## 2016-10-04 LAB — TSH: TSH: 12.441 u[IU]/mL — AB (ref 0.350–4.500)

## 2016-10-04 LAB — MAGNESIUM: MAGNESIUM: 2 mg/dL (ref 1.7–2.4)

## 2016-10-04 MED ORDER — SODIUM CHLORIDE 0.9 % IV SOLN
INTRAVENOUS | Status: DC
  Administered 2016-10-04 – 2016-10-05 (×4): via INTRAVENOUS

## 2016-10-04 MED ORDER — PROCHLORPERAZINE MALEATE 5 MG PO TABS: 10.0000 mg | ORAL_TABLET | Freq: Four times a day (QID) | ORAL | Status: DC | PRN

## 2016-10-04 MED ORDER — TRAMADOL HCL 50 MG PO TABS
50.0000 mg | ORAL_TABLET | Freq: Four times a day (QID) | ORAL | Status: DC | PRN
  Filled 2016-10-04: qty 1

## 2016-10-04 MED ORDER — VITAMIN C 500 MG PO TABS
1000.0000 mg | ORAL_TABLET | ORAL | Status: DC
  Administered 2016-10-04: 1000 mg via ORAL
  Filled 2016-10-04: qty 2

## 2016-10-04 MED ORDER — ACETAMINOPHEN 325 MG PO TABS
650.0000 mg | ORAL_TABLET | Freq: Four times a day (QID) | ORAL | Status: DC | PRN
  Administered 2016-10-04: 650 mg via ORAL
  Filled 2016-10-04: qty 2

## 2016-10-04 MED ORDER — SALINE SPRAY 0.65 % NA SOLN
1.0000 | NASAL | Status: DC | PRN
  Administered 2016-10-04: 1 via NASAL
  Filled 2016-10-04: qty 44

## 2016-10-04 MED ORDER — DIPHENOXYLATE-ATROPINE 2.5-0.025 MG PO TABS
1.0000 | ORAL_TABLET | Freq: Four times a day (QID) | ORAL | Status: DC | PRN
  Administered 2016-10-04: 1 via ORAL
  Filled 2016-10-04: qty 1

## 2016-10-04 MED ORDER — TURMERIC 500 MG PO CAPS: ORAL_CAPSULE | Freq: Two times a day (BID) | ORAL | Status: DC

## 2016-10-04 MED ORDER — SENNA 8.6 MG PO TABS: ORAL_TABLET | Freq: Every day | ORAL | Status: DC

## 2016-10-04 MED ORDER — SODIUM CHLORIDE 0.9 % IV SOLN: INTRAVENOUS | Status: AC

## 2016-10-04 MED ORDER — LORATADINE 10 MG PO TABS
10.0000 mg | ORAL_TABLET | Freq: Every day | ORAL | Status: DC
  Filled 2016-10-04: qty 1

## 2016-10-04 MED ORDER — LOPERAMIDE HCL 2 MG PO CAPS
2.0000 mg | ORAL_CAPSULE | ORAL | Status: DC | PRN
  Administered 2016-10-04: 4 mg via ORAL
  Filled 2016-10-04: qty 2

## 2016-10-04 MED ORDER — ONDANSETRON HCL 4 MG PO TABS: 4.0000 mg | ORAL_TABLET | Freq: Four times a day (QID) | ORAL | Status: DC | PRN

## 2016-10-04 NOTE — Progress Notes (Signed)
PROGRESS NOTE    Martha Becker  [id number] DOB: Dec 12, 1955 DOA: 10/03/2016 PCP: [name], MD     Brief Narrative:  61 year old woman admitted from home on complaints of generalized weakness and diarrhea. She has a history of metastatic colon cancer. Was admitted for severe hyponatremia with a sodium of 123.   Assessment & Plan:   Principal Problem:   Hyponatremia Active Problems:   Cecal cancer (HCC)   Peritoneal carcinomatosis (HCC)   Lung metastasis (HCC)   Anemia of chronic disease   Dehydration   Fatigue   Hyponatremia -Likely due to diarrhea and decreased oral intake in face of ongoing chemotherapy. -Sodium increased to 125, plan to continue IV fluids throughout the day today.  Generalized weakness -Likely due to hyponatremia as well as metastatic colon cancer and ongoing chemotherapy. -Check vitamin B-12, TSH, request PT evaluation.  Metastatic colon cancer -Follow-up with oncology as an outpatient   DVT prophylaxis: SCDs Code Status: Full code Family Communication: Patient only Disposition Plan: Hope for discharge home in 24-48 hours  Consultants:   None  Procedures:   None  Antimicrobials:  Anti-infectives    None       Subjective: Still feels weak, not at baseline  Objective: Vitals:   10/03/16 2325 10/04/16 0000 10/04/16 0050 10/04/16 1323  BP: 110/66 111/72  (!) 91/58  Pulse: 92   85  Resp: 20  20 20   Temp:   (!) 100.8 F (38.2 C) 98.5 F (36.9 C)  TempSrc:   Oral Oral  SpO2: 96%  95% 94%  Weight:   66.4 kg (146 lb 6.2 oz)   Height:   5\' 6"  (1.676 m)     Intake/Output Summary (Last 24 hours) at 10/04/16 1505 Last data filed at 10/04/16 1331  Gross per 24 hour  Intake          2103.75 ml  Output              900 ml  Net          1203.75 ml   Filed Weights   10/03/16 1542 10/04/16 0050  Weight: 63.5 kg (140 lb) 66.4 kg (146 lb 6.2 oz)    Examination:  General exam: Alert, awake, oriented x 3 Respiratory system:  Clear to auscultation. Respiratory effort normal. Cardiovascular system:RRR. No murmurs, rubs, gallops. Gastrointestinal system: Abdomen is nondistended, soft and nontender. No organomegaly or masses felt. Normal bowel sounds heard. Central nervous system: Alert and oriented. No focal neurological deficits. Extremities: No C/C/E, +pedal pulses Skin: No rashes, lesions or ulcers Psychiatry: Judgement and insight appear normal. Mood & affect appropriate.     Data Reviewed: I have personally reviewed following labs and imaging studies  CBC:  Recent Labs Lab 10/03/16 1628 10/04/16 0512  WBC 4.1 3.6*  HGB 9.0* 7.9*  HCT 25.8* 23.3*  MCV 79.4 79.8  PLT 202 99991111   Basic Metabolic Panel:  Recent Labs Lab 10/03/16 1628 10/04/16 0512  NA 123* 125*  K 4.0 3.9  CL 91* 96*  CO2 22 21*  GLUCOSE 106* 106*  BUN 9 8  CREATININE 0.61 0.50  CALCIUM 8.6* 7.9*  MG 2.0  --   PHOS 3.1  --    GFR: Estimated Creatinine Clearance: 71.8 mL/min (by C-G formula based on SCr of 0.5 mg/dL). Liver Function Tests:  Recent Labs Lab 10/03/16 1628  AST 123*  ALT 84*  ALKPHOS 235*  BILITOT 0.4  PROT 5.8*  ALBUMIN 2.5*    Recent Labs  Lab 10/03/16 1628  LIPASE 21   No results for input(s): AMMONIA in the last 168 hours. Coagulation Profile: No results for input(s): INR, PROTIME in the last 168 hours. Cardiac Enzymes: No results for input(s): CKTOTAL, CKMB, CKMBINDEX, TROPONINI in the last 168 hours. BNP (last 3 results) No results for input(s): PROBNP in the last 8760 hours. HbA1C: No results for input(s): HGBA1C in the last 72 hours. CBG: No results for input(s): GLUCAP in the last 168 hours. Lipid Profile: No results for input(s): CHOL, HDL, LDLCALC, TRIG, CHOLHDL, LDLDIRECT in the last 72 hours. Thyroid Function Tests: No results for input(s): TSH, T4TOTAL, FREET4, T3FREE, THYROIDAB in the last 72 hours. Anemia Panel: No results for input(s): VITAMINB12, FOLATE, FERRITIN,  TIBC, IRON, RETICCTPCT in the last 72 hours. Urine analysis:    Component Value Date/Time   COLORURINE YELLOW 10/03/2016 1438   APPEARANCEUR CLEAR 10/03/2016 1438   LABSPEC 1.014 10/03/2016 1438   PHURINE 5.0 10/03/2016 1438   GLUCOSEU NEGATIVE 10/03/2016 1438   HGBUR SMALL (A) 10/03/2016 1438   BILIRUBINUR NEGATIVE 10/03/2016 1438   KETONESUR NEGATIVE 10/03/2016 1438   PROTEINUR NEGATIVE 10/03/2016 1438   UROBILINOGEN 0.2 11/14/2014 2010   NITRITE NEGATIVE 10/03/2016 1438   LEUKOCYTESUR TRACE (A) 10/03/2016 1438   Sepsis Labs: @LABRCNTIP (procalcitonin:4,lacticidven:4)  )No results found for this or any previous visit (from the past 240 hour(s)).       Radiology Studies: No results found.      Scheduled Meds: . sodium chloride   Intravenous STAT  . loratadine  10 mg Oral Daily  . senna   Oral Daily  . vitamin C  1,000 mg Oral QODAY   Continuous Infusions: . sodium chloride 125 mL/hr at 10/04/16 1035     LOS: 0 days    Time spent: 25 minutes. Greater than 50% of this time was spent in direct contact with the patient coordinating care.     Lelon Frohlich, MD [facility] Pager [phone]  If 7PM-7AM, please contact night-coverage [email or web address] Password [email or web address] 10/04/2016, 3:05 PM

## 2016-10-04 NOTE — Care Management Note (Signed)
Case Management Note  Patient Details  Name: Bre Getchell MRN: [id number] Date of Birth: 09-Nov-1955    Expected Discharge Date:        10/04/2016          Expected Discharge Plan:  Home/Self Care  In-House Referral:  Financial Counselor  Discharge planning Services  CM Consult  Post Acute Care Choice:    Choice offered to:     DME Arranged:  Patient refused services DME Agency:     HH Arranged:    [place] Agency:     Status of Service:  Completed, signed off  If discussed at [employer], dates discussed:    Additional Comments: CM consulted for possible HH needs. CM explained that [facility] would be 2-3 visits per week for approx. 30-45 minutes. Patient states that would not really help her. She is interested in someone staying with her during the day for extended hours.  CM offered list of private duty agencies, patient declined. She states she has been through that with her mother and "money runs out fast". She states she has inquired about Medicaid/Medicare for herself and has some questions. CM placed consult with Financial counselor, who will speak with patient today.  No other CM needs known at this time.  Metta Koranda, Chauncey Reading, RN 10/04/2016, 10:29 AM

## 2016-10-05 DIAGNOSIS — E871 Hypo-osmolality and hyponatremia: Secondary | ICD-10-CM | POA: Diagnosis not present

## 2016-10-05 LAB — BASIC METABOLIC PANEL
Anion gap: 8 (ref 5–15)
BUN: 7 mg/dL (ref 6–20)
CO2: 21 mmol/L — AB (ref 22–32)
CREATININE: 0.55 mg/dL (ref 0.44–1.00)
Calcium: 8 mg/dL — ABNORMAL LOW (ref 8.9–10.3)
Chloride: 101 mmol/L (ref 101–111)
GFR calc Af Amer: >60 mL/min (ref >60)
GFR calc non Af Amer: >60 mL/min (ref >60)
GLUCOSE: 92 mg/dL (ref 65–99)
Potassium: 3.7 mmol/L (ref 3.5–5.1)
Sodium: 130 mmol/L — ABNORMAL LOW (ref 135–145)

## 2016-10-05 MED ORDER — LEVOTHYROXINE SODIUM 50 MCG PO TABS: 50.0000 ug | ORAL_TABLET | Freq: Every day | ORAL | 2 refills | Status: DC

## 2016-10-05 NOTE — Progress Notes (Signed)
Discharge instructions read to patient and her family. All questions answered. Discharged to home with family

## 2016-10-05 NOTE — Care Management (Signed)
Patient still declines any Home health. Discharging today. No other CM needs.

## 2016-10-05 NOTE — Discharge Summary (Signed)
Physician Discharge Summary  Martha Becker [id number] DOB: 1955/11/24 DOA: 10/03/2016  PCP: [name], MD  Admit date: 10/03/2016 Discharge date: 10/05/2016  Time spent: 45 minutes  Recommendations for Outpatient Follow-up:  -To be discharged home today. -Advised to follow-up with oncologist as scheduled. -We'll need repeat thyroid function tests in 4-6 weeks.   Discharge Diagnoses:  Principal Problem:   Hyponatremia Active Problems:   Cecal cancer (HCC)   Peritoneal carcinomatosis ([place])   Lung metastasis (HCC)   Anemia of chronic disease   Dehydration   Fatigue   Discharge Condition: Stable and improved  Filed Weights   10/03/16 1542 10/04/16 0050  Weight: 63.5 kg (140 lb) 66.4 kg (146 lb 6.2 oz)    History of present illness:  As per Dr. Shanon Brow on 1/23: Martha Becker is a 61 y.o. female with medical history significant of colon cancer with metastatic disease on chemo on/off for over 2 years comes in with generalized weakness and says "i cant do this anymore".  Pt is single and lives alone and has minimal help at home.  She has one sister who is disabled.  Pt reports she took a break from her chemo treatments in august of 2017, because she didn't want to be sick during the holidays.  She was very weak and overall at low functional status for herself then.  She said she recovered some strength during the holidays but not much.  Her oncologist encouraged her to start chemo again, as during this time her disease had advanced.  She started again this month and she says she cant handle it.  She is scheduled again on Monday.  She reports she get so weak she can barely get out of bed for days after her chemo is administered.  She comes in today with overall weakness.  She denies fevers.  She denies nausea and vomiting.  She has had some diarrhea.  She has had no nasal congestioin, cough or sob.  Her oncologist has mentioned to her hospice, but the plan was to try 3 months of chemo  in jan/feb/march and reassess after that.  Pt feels like she cant do it.  She is referred for admission for hyponatremia and dehydration.  She has not HH/PT at home arranged.  Hospital Course:   Hyponatremia -Likely due to diarrhea and decreased oral intake in face of ongoing chemotherapy. -Also hypothyroidism is likely playing a role. -Sodium increased to 130, plan to continue IV fluids throughout the day today.  Generalized weakness -Likely due to hyponatremia as well as metastatic colon cancer and ongoing chemotherapy. Hypothyroidism playing a role as well. -B-12 is within normal limits. TSH elevated, will start thyroid replacement therapy.  Hypothyroidism -New diagnosis. -TSH is 12.44. -We'll start on Synthroid 50 g daily, will need repeat thyroid function tests in 4-6 weeks and dose adjustment as deemed necessary.  Metastatic colon cancer -Follow-up with oncology as an outpatient  Procedures:  None   Consultations:  None  Discharge Instructions  Discharge Instructions    Diet - low sodium heart healthy    Complete by:  As directed    Increase activity slowly    Complete by:  As directed      Allergies as of 10/05/2016      Reactions   Codeine Nausea And Vomiting      Medication List    STOP taking these medications   ADRUCIL IV   CAMPTOSAR IV   fosaprepitant 150 mg, dexamethasone 12 mg in  sodium chloride 0.9 % 145 mL   lidocaine-prilocaine cream Commonly known as:  EMLA     TAKE these medications   diphenoxylate-atropine 2.5-0.025 MG tablet Commonly known as:  LOMOTIL Take 1 tablet by mouth 4 (four) times daily as needed for diarrhea or loose stools.   levothyroxine 50 MCG tablet Commonly known as:  SYNTHROID Take 1 tablet (50 mcg total) by mouth daily before breakfast.   loperamide 2 MG capsule Commonly known as:  IMODIUM Take 2-4 mg by mouth as needed for diarrhea or loose stools.   loratadine 10 MG tablet Commonly known as:   CLARITIN Take 10 mg by mouth daily.   ondansetron 4 MG tablet Commonly known as:  ZOFRAN Take 1 tablet (4 mg total) by mouth every 6 (six) hours as needed for nausea or vomiting.   prochlorperazine 10 MG tablet Commonly known as:  COMPAZINE Take 1 tablet (10 mg total) by mouth every 6 (six) hours as needed for nausea or vomiting.   senna 8.6 MG tablet Commonly known as:  SENOKOT Take 2 tablets by mouth daily.   traMADol 50 MG tablet Commonly known as:  ULTRAM Take 1 tablet (50 mg total) by mouth every 6 (six) hours as needed. What changed:  how much to take   Turmeric 500 MG Caps Take by mouth 2 (two) times daily. Takes once a day at times   vitamin C 1000 MG tablet Take 1,000 mg by mouth every other day.      Allergies  Allergen Reactions  . Codeine Nausea And Vomiting   Follow-up Information    [name], MD. Schedule an appointment as soon as possible for a visit in 4 week(s).   Specialty:  Family Medicine Why:  To recheck your thyroid function Contact information: [address] [address] [phone]            The results of significant diagnostics from this hospitalization (including imaging, microbiology, ancillary and laboratory) are listed below for reference.    Significant Diagnostic Studies: No results found.  Microbiology: No results found for this or any previous visit (from the past 240 hour(s)).   Labs: Basic Metabolic Panel:  Recent Labs Lab 10/03/16 1628 10/04/16 0512 10/05/16 0509  NA 123* 125* 130*  K 4.0 3.9 3.7  CL 91* 96* 101  CO2 22 21* 21*  GLUCOSE 106* 106* 92  BUN 9 8 7   CREATININE 0.61 0.50 0.55  CALCIUM 8.6* 7.9* 8.0*  MG 2.0  --   --   PHOS 3.1  --   --    Liver Function Tests:  Recent Labs Lab 10/03/16 1628  AST 123*  ALT 84*  ALKPHOS 235*  BILITOT 0.4  PROT 5.8*  ALBUMIN 2.5*    Recent Labs Lab 10/03/16 1628  LIPASE 21   No results for input(s): AMMONIA in the last 168  hours. CBC:  Recent Labs Lab 10/03/16 1628 10/04/16 0512  WBC 4.1 3.6*  HGB 9.0* 7.9*  HCT 25.8* 23.3*  MCV 79.4 79.8  PLT 202 189   Cardiac Enzymes: No results for input(s): CKTOTAL, CKMB, CKMBINDEX, TROPONINI in the last 168 hours. BNP: BNP (last 3 results) No results for input(s): BNP in the last 8760 hours.  ProBNP (last 3 results) No results for input(s): PROBNP in the last 8760 hours.  CBG: No results for input(s): GLUCAP in the last 168 hours.     SignedLelon Frohlich  [facility] Pager: [phone] 10/05/2016, 3:31 PM

## 2016-10-05 NOTE — Evaluation (Signed)
Physical Therapy Evaluation Patient Details Name: Elsee Berghorst MRN: [id number] DOB: [birth year] Today's Date: 10/05/2016   History of Present Illness  61 y.o. female with medical history significant of colon cancer with metastatic disease on chemo on/off for over 2 years comes in with generalized weakness and says "i cant do this anymore".  Pt is single and lives alone and has minimal help at home.  She has one sister who is disabled.  Pt reports she took a break from her chemo treatments in august of 2017, because she didn't want to be sick during the holidays.  She was very weak and overall at low functional status for herself then.  She said she recovered some strength during the holidays but not much.  Her oncologist encouraged her to start chemo again, as during this time her disease had advanced.  She started again this month and she says she cant handle it.  She is scheduled again on Monday.  She reports she get so weak she can barely get out of bed for days after her chemo is administered.  She comes in today with overall weakness.  She denies fevers.  She denies nausea and vomiting.  She has had some diarrhea.  She has had no nasal congestioin, cough or sob.  Her oncologist has mentioned to her hospice, but the plan was to try 3 months of chemo in jan/feb/march and reassess after that.  Pt feels like she cant do it.  She is referred for admission for hyponatremia and dehydration.  She has not HH/PT at home arranged.    Clinical Impression  Pt received in bed, and was agreeable to PT evaluation.  Pt expressed that just prior to admissions she was very weak, and was not really able to walk, but does not give specifics.  She was independent with ADL's, but had someone go get her groceries and run errands for her.  During PT evaluation she was independent with transfer sit<>stand and independent for gait x 136ft, however she demonstrates slow cadence.  Recommend HHPT at this time to improve her strength  and mobility.  Pt is hesitant and states that she does not feel like it will do any good because she is just going to become weak with the next chemo treatment.  Educated pt on importance of maintaining and building strength while on chemo.    Follow Up Recommendations Home health PT    Equipment Recommendations  None recommended by PT    Recommendations for Other Services       Precautions / Restrictions Precautions Precautions: Fall Precaution Comments: when trying to get up off the toilet due to having no energy  Restrictions Weight Bearing Restrictions: No      Mobility  Bed Mobility Overal bed mobility: Modified Independent                Transfers Overall transfer level: Independent                  Ambulation/Gait Ambulation/Gait assistance: Independent Ambulation Distance (Feet): 125 Feet Assistive device: None Gait Pattern/deviations: Step-through pattern     General Gait Details: decreased cadence, but no LOB.   Stairs            Wheelchair Mobility    Modified Rankin (Stroke Patients Only)       Balance Overall balance assessment: History of Falls;Independent;No apparent balance deficits (not formally assessed)  Pertinent Vitals/Pain Pain Assessment: No/denies pain    Home Living   Living Arrangements: Alone Available Help at Discharge: Friend(s) (Sister comes occasionally, and friend, and a few brothers) Type of Home: House Home Access: Stairs to enter   [employer] of Steps: 2 out the front.  Home Layout: One level Home Equipment: None      Prior Function Level of Independence: Independent   Gait / Transfers Assistance Needed: Pt states that she was really weak before coming in, and states that she was not really able to walk, but will not give a direct answer.  Pt states that she needed someone else to go get her groceries and run errands.   ADL's  / Homemaking Assistance Needed: independent.         Hand Dominance        Extremity/Trunk Assessment   Upper Extremity Assessment Upper Extremity Assessment: Generalized weakness    Lower Extremity Assessment Lower Extremity Assessment: Generalized weakness       Communication   Communication: No difficulties  Cognition Arousal/Alertness: Awake/alert Behavior During Therapy: Flat affect Overall Cognitive Status: Within Functional Limits for tasks assessed                      General Comments      Exercises     Assessment/Plan    PT Assessment All further PT needs can be met in the next venue of care  PT Problem List Decreased strength;Decreased activity tolerance;Decreased mobility          PT Treatment Interventions      PT Goals (Current goals can be found in the Care Plan section)  Acute Rehab PT Goals Patient Stated Goal: To go home PT Goal Formulation: All assessment and education complete, DC therapy    Frequency     Barriers to discharge        Co-evaluation               End of Session Equipment Utilized During Treatment: Gait belt Activity Tolerance: Patient tolerated treatment well Patient left: in chair;with call bell/phone within reach Nurse Communication: Mobility status (mobility sheet left hanging in the room. )         Time: 0940-1009 PT Time Calculation (min) (ACUTE ONLY): 29 min   Charges:   PT Evaluation $PT Eval Low Complexity: 1 Procedure PT Treatments $Gait Training: 8-22 mins   PT G Codes:        Beth Ace Bergfeld, PT, DPT X: [id number]

## 2016-10-09 ENCOUNTER — Other Ambulatory Visit (HOSPITAL_BASED_OUTPATIENT_CLINIC_OR_DEPARTMENT_OTHER): Payer: 59

## 2016-10-09 ENCOUNTER — Ambulatory Visit: Payer: 59

## 2016-10-09 ENCOUNTER — Ambulatory Visit (HOSPITAL_BASED_OUTPATIENT_CLINIC_OR_DEPARTMENT_OTHER): Payer: 59 | Admitting: Nurse Practitioner

## 2016-10-09 VITALS — BP 108/71 | HR 113 | Temp 98.2°F | Resp 20 | Wt 154.6 lb

## 2016-10-09 DIAGNOSIS — C7801 Secondary malignant neoplasm of right lung: Secondary | ICD-10-CM | POA: Diagnosis not present

## 2016-10-09 DIAGNOSIS — C182 Malignant neoplasm of ascending colon: Secondary | ICD-10-CM

## 2016-10-09 DIAGNOSIS — Z8672 Personal history of thrombophlebitis: Secondary | ICD-10-CM

## 2016-10-09 DIAGNOSIS — C7802 Secondary malignant neoplasm of left lung: Secondary | ICD-10-CM | POA: Diagnosis not present

## 2016-10-09 DIAGNOSIS — G620 Drug-induced polyneuropathy: Secondary | ICD-10-CM

## 2016-10-09 DIAGNOSIS — C180 Malignant neoplasm of cecum: Secondary | ICD-10-CM

## 2016-10-09 DIAGNOSIS — Z95828 Presence of other vascular implants and grafts: Secondary | ICD-10-CM

## 2016-10-09 DIAGNOSIS — C786 Secondary malignant neoplasm of retroperitoneum and peritoneum: Secondary | ICD-10-CM

## 2016-10-09 DIAGNOSIS — C801 Malignant (primary) neoplasm, unspecified: Secondary | ICD-10-CM

## 2016-10-09 DIAGNOSIS — K1231 Oral mucositis (ulcerative) due to antineoplastic therapy: Secondary | ICD-10-CM

## 2016-10-09 DIAGNOSIS — R05 Cough: Secondary | ICD-10-CM

## 2016-10-09 DIAGNOSIS — R0602 Shortness of breath: Secondary | ICD-10-CM

## 2016-10-09 DIAGNOSIS — Z800 Family history of malignant neoplasm of digestive organs: Secondary | ICD-10-CM

## 2016-10-09 DIAGNOSIS — C7800 Secondary malignant neoplasm of unspecified lung: Secondary | ICD-10-CM

## 2016-10-09 DIAGNOSIS — C787 Secondary malignant neoplasm of liver and intrahepatic bile duct: Secondary | ICD-10-CM | POA: Diagnosis not present

## 2016-10-09 DIAGNOSIS — R600 Localized edema: Secondary | ICD-10-CM

## 2016-10-09 LAB — COMPREHENSIVE METABOLIC PANEL
ALT: 48 U/L (ref 0–55)
ANION GAP: 12 meq/L — AB (ref 3–11)
AST: 60 U/L — ABNORMAL HIGH (ref 5–34)
Albumin: 2.4 g/dL — ABNORMAL LOW (ref 3.5–5.0)
Alkaline Phosphatase: 304 U/L — ABNORMAL HIGH (ref 40–150)
BUN: 4.9 mg/dL — ABNORMAL LOW (ref 7.0–26.0)
CALCIUM: 9 mg/dL (ref 8.4–10.4)
CHLORIDE: 96 meq/L — AB (ref 98–109)
CO2: 25 meq/L (ref 22–29)
Creatinine: 0.6 mg/dL (ref 0.6–1.1)
Glucose: 112 mg/dl (ref 70–140)
POTASSIUM: 3.5 meq/L (ref 3.5–5.1)
Sodium: 133 mEq/L — ABNORMAL LOW (ref 136–145)
Total Bilirubin: 0.47 mg/dL (ref 0.20–1.20)
Total Protein: 5.6 g/dL — ABNORMAL LOW (ref 6.4–8.3)

## 2016-10-09 LAB — CEA (IN HOUSE-CHCC): CEA (CHCC-IN HOUSE): 88.27 ng/mL — AB (ref 0.00–5.00)

## 2016-10-09 LAB — CBC WITH DIFFERENTIAL/PLATELET
BASO%: 0.6 % (ref 0.0–2.0)
BASOS ABS: 0.2 10*3/uL — AB (ref 0.0–0.1)
EOS%: 0.2 % (ref 0.0–7.0)
Eosinophils Absolute: 0.1 10*3/uL (ref 0.0–0.5)
HEMATOCRIT: 27.2 % — AB (ref 34.8–46.6)
HGB: 8.9 g/dL — ABNORMAL LOW (ref 11.6–15.9)
LYMPH#: 1.5 10*3/uL (ref 0.9–3.3)
LYMPH%: 3.8 % — ABNORMAL LOW (ref 14.0–49.7)
MCH: 26.5 pg (ref 25.1–34.0)
MCHC: 32.7 g/dL (ref 31.5–36.0)
MCV: 81.2 fL (ref 79.5–101.0)
MONO#: 1.7 10*3/uL — AB (ref 0.1–0.9)
MONO%: 4.4 % (ref 0.0–14.0)
NEUT#: 35.9 10*3/uL — ABNORMAL HIGH (ref 1.5–6.5)
NEUT%: 91 % — AB (ref 38.4–76.8)
PLATELETS: 556 10*3/uL — AB (ref 145–400)
RBC: 3.35 10*6/uL — AB (ref 3.70–5.45)
RDW: 17.4 % — ABNORMAL HIGH (ref 11.2–14.5)
WBC: 39.5 10*3/uL — ABNORMAL HIGH (ref 3.9–10.3)

## 2016-10-09 MED ORDER — BENZONATATE 100 MG PO CAPS: 100.0000 mg | ORAL_CAPSULE | Freq: Three times a day (TID) | ORAL | 0 refills | Status: DC | PRN

## 2016-10-09 MED ORDER — HEPARIN SOD (PORK) LOCK FLUSH 100 UNIT/ML IV SOLN
500.0000 [IU] | INTRAVENOUS | Status: AC | PRN
  Administered 2016-10-09: 500 [IU]
  Filled 2016-10-09: qty 5

## 2016-10-09 MED ORDER — SODIUM CHLORIDE 0.9 % IJ SOLN
10.0000 mL | INTRAMUSCULAR | Status: AC | PRN
  Administered 2016-10-09: 10 mL
  Filled 2016-10-09: qty 10

## 2016-10-09 MED ORDER — HEPARIN SOD (PORK) LOCK FLUSH 100 UNIT/ML IV SOLN
500.0000 [IU] | INTRAVENOUS | Status: DC | PRN
  Filled 2016-10-09: qty 5

## 2016-10-09 NOTE — Patient Instructions (Signed)

## 2016-10-09 NOTE — Progress Notes (Addendum)
[facility] OFFICE PROGRESS NOTE   Diagnosis:  Colon cancer  INTERVAL HISTORY:   Martha Becker returns as scheduled. She completed cycle 1 FOLFIRI/Avastin 09/25/2016. She was hospitalized 10/03/2016 with generalized weakness/hyponatremia. She was discharged home the following day. She denies nausea/vomiting. She had mild diarrhea. She took Imodium as needed with good control. No mouth sores. She continues to have shortness of breath. The cough has increased. She feels very weak. She stayed in bed for 7 days following the chemotherapy. She continues to spend the majority of the day in bed or on the couch. She reports a good appetite. She has developed bilateral leg edema. She states she is too weak for chemotherapy today.  Objective:  Vital signs in last 24 hours:  Blood pressure 108/71, pulse (!) 113, temperature 98.2 F (36.8 C), temperature source Oral, resp. rate 20, weight 154 lb 9.6 oz (70.1 kg), SpO2 94 %.    HEENT: No thrush or ulcers. Resp: Lungs clear bilaterally. Increased respiratory rate. Cardio: Regular rate and rhythm. GI: Abdomen soft and nontender. No hepatomegaly. Vascular: Pitting edema both legs. Neuro: Alert and oriented.  Port-A-Cath without erythema.  Lab Results:  Lab Results  Component Value Date   WBC 39.5 (H) 10/09/2016   HGB 8.9 (L) 10/09/2016   HCT 27.2 (L) 10/09/2016   MCV 81.2 10/09/2016   PLT 556 (H) 10/09/2016   NEUTROABS 35.9 (H) 10/09/2016    Imaging:  No results found.  Medications: I have reviewed the patient's current medications.  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 no loss of mismatch repair protein expression, microsatellite stable ? 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 APC alteration detected ? Staging CT scans December 2015 and PET scan  09/22/2014 consistent with metastatic bilateral lung nodules, metastatic retroperitoneal lymphadenopathy, and a probable left liver metastasis ? Cycle 1 CAPOX 10/29/2014 (Xeloda discontinued day 9 secondary to diarrhea)  ? CT 11/14/2014 revealed progressive metastatic disease at the lung bases and liver compared to a PET scan from 09/22/2014, mild improvement in retroperitoneal lymphadenopathy ? Cycle 1 FOLFOX 12/02/2014 ? Cycle 2 FOLFOX, Avastin added 12/16/2014 ? Cycle 3 FOLFOX/Avastin 12/30/2014 ? Cycle 4 FOLFOX/Avastin 01/13/2015 ? Cycle 5 FOLFOX/Avastin 01/27/2015  CT 02/10/2015 with improvement in hepatic metastases, mild improvement of lung metastases and retroperitoneal lymphadenopathy  Cycle 6 FOLFOX/Avastin 02/16/2015  Cycle 7 FOLFOX/Avastin 03/02/2015  Cycle 8 FOLFOX/Avastin 03/16/2015  Cycle 9 FOLFOX/Avastin 04/13/2015  Restaging CTs 04/20/2015 with a decrease in the size of pulmonary nodules, liver lesions, and retroperitoneal lymph nodes  Restaging CTs 07/27/2015 revealed enlargement of bilateral lung nodules, stable liver lesions, decreased periaortic adenopathy  CTs 10/05/2015 revealed further enlargement of bilateral lung nodules ,enlargement of liver lesions, and enlargement of peritoneal lymph nodes  Cycle 1 FOLFIRI/Avastin 10/12/2015  Cycle 2 FOLFIRI/Avastin 10/26/2015  Cycle 3 FOLFIRI/Avastin 11/09/2015  Cycle 4 FOLFIRI/Avastin 11/23/2015  Cycle 5 FOLFIRI/Avastin 12/28/2015  CTs 01/06/2016-decreased size of lung nodules , decreased / regression of liver lesions, regression of retroperitoneal nodes  Cycle 6 FOLFIRI/Avastin 01/11/2016 (Neulasta added)   Cycle 7 FOLFIRI/Avastin 02/01/2016 with Neulasta support  Cycle 8 FOLFIRI/Avastin 02/22/2016 with Neulasta support  Cycle 9 FOLFIRI/Avastin 03/13/2016 with Neulasta support  Cycle 10 FOLFIRI/Avastin 04/04/2016 with Neulasta support  Restaging CTs 04/17/2016-decreased size of lung and liver  lesions  Patient decided on treatment break  Restaging CTs 08/22/2016-marked progression in the lungs and liver  Cycle  1 FOLFIRI/Avastin 09/25/2016  2. Elevated TSH, PET scan 09/22/2014 with diffuse increased metabolic uptake in the thyroid   3. Family history of multiple cancers including colon cancer in her paternal grandfather and rectal cancer in a paternal first cousin  16. Admission 11/14/2014 with nausea and diarrhea, most likely secondary to capecitabine induced enteritis-resolved. CT 11/14/2014 consistent with ileitis  5.Superficial phlebitis left hand/wrist 12/30/2014.  6. Early oxaliplatin neuropathy  7. Pain at the right gluteus and right leg-MRI of the lumbar spine 07/13/2015 confirmed a disc protrusion at L5-S1  8. Diaphoresis, rhinorrhea, shortness of breath and cough following cycle 1 FOLFIRI/Avastin, did not occur following cycle 2  9. Oral mucositis secondary to chemotherapy  10. Nonproductive cough secondary to metastatic colon cancer  11. Neutropenia secondary to chemotherapy -Neulasta added with cycle 6 FOLFIRI 01/11/2016   Disposition: Martha Becker has completed 1 cycle of FOLFIRI/Avastin. She continues to have a poor performance status. She understands the poor performance status is mainly due to cancer as opposed to the chemotherapy. We discussed discontinuing chemotherapy and switching to a supportive care approach with enrollment in the hospice program. We also discussed a trial of Lonsurf. She would like to return in one week for further discussion prior to making a decision.  Patient seen with Dr. Benay Spice. 25 minutes were spent face-to-face at today's visit with the majority of that time involved in counseling/coordination of care.    Ned Card ANP/GNP-BC   10/09/2016  11:44 AM This was a shared visit with Ned Card. Martha Becker was interviewed and examined. She has a poor performance status secondary to metastatic  colon cancer. Her performance status does not appear adequate to receive chemotherapy today. We discussed hospice care. She will return for an office visit and reassessment in one week. We will consider FOLFIRI/Avastin, Lonsurf, and comfort care options when she returns next week.  I suspect the cough and malaise are related to progression of the metastatic colon cancer.  [name], M.D.

## 2016-10-14 ENCOUNTER — Telehealth: Payer: Self-pay | Admitting: Oncology

## 2016-10-14 NOTE — Telephone Encounter (Signed)
LVM ADVISING APPT 2/7 @ 10.30AM.

## 2016-10-18 ENCOUNTER — Telehealth: Payer: Self-pay | Admitting: Oncology

## 2016-10-18 ENCOUNTER — Other Ambulatory Visit: Payer: Self-pay

## 2016-10-18 ENCOUNTER — Ambulatory Visit (HOSPITAL_BASED_OUTPATIENT_CLINIC_OR_DEPARTMENT_OTHER): Payer: 59 | Admitting: Nurse Practitioner

## 2016-10-18 ENCOUNTER — Ambulatory Visit (HOSPITAL_BASED_OUTPATIENT_CLINIC_OR_DEPARTMENT_OTHER): Payer: 59

## 2016-10-18 ENCOUNTER — Other Ambulatory Visit (HOSPITAL_BASED_OUTPATIENT_CLINIC_OR_DEPARTMENT_OTHER): Payer: 59

## 2016-10-18 ENCOUNTER — Ambulatory Visit: Payer: 59

## 2016-10-18 VITALS — BP 115/75 | HR 78 | Temp 98.2°F | Resp 18 | Ht 66.0 in | Wt 144.4 lb

## 2016-10-18 DIAGNOSIS — C7801 Secondary malignant neoplasm of right lung: Secondary | ICD-10-CM

## 2016-10-18 DIAGNOSIS — C182 Malignant neoplasm of ascending colon: Secondary | ICD-10-CM

## 2016-10-18 DIAGNOSIS — C7800 Secondary malignant neoplasm of unspecified lung: Secondary | ICD-10-CM

## 2016-10-18 DIAGNOSIS — Z5112 Encounter for antineoplastic immunotherapy: Secondary | ICD-10-CM

## 2016-10-18 DIAGNOSIS — C787 Secondary malignant neoplasm of liver and intrahepatic bile duct: Secondary | ICD-10-CM

## 2016-10-18 DIAGNOSIS — C180 Malignant neoplasm of cecum: Secondary | ICD-10-CM

## 2016-10-18 DIAGNOSIS — K1231 Oral mucositis (ulcerative) due to antineoplastic therapy: Secondary | ICD-10-CM

## 2016-10-18 DIAGNOSIS — C786 Secondary malignant neoplasm of retroperitoneum and peritoneum: Secondary | ICD-10-CM

## 2016-10-18 DIAGNOSIS — R0602 Shortness of breath: Secondary | ICD-10-CM

## 2016-10-18 DIAGNOSIS — C801 Malignant (primary) neoplasm, unspecified: Secondary | ICD-10-CM

## 2016-10-18 DIAGNOSIS — C7802 Secondary malignant neoplasm of left lung: Secondary | ICD-10-CM

## 2016-10-18 DIAGNOSIS — G620 Drug-induced polyneuropathy: Secondary | ICD-10-CM

## 2016-10-18 DIAGNOSIS — Z5111 Encounter for antineoplastic chemotherapy: Secondary | ICD-10-CM

## 2016-10-18 DIAGNOSIS — Z8672 Personal history of thrombophlebitis: Secondary | ICD-10-CM

## 2016-10-18 DIAGNOSIS — Z95828 Presence of other vascular implants and grafts: Secondary | ICD-10-CM

## 2016-10-18 DIAGNOSIS — R05 Cough: Secondary | ICD-10-CM

## 2016-10-18 DIAGNOSIS — Z800 Family history of malignant neoplasm of digestive organs: Secondary | ICD-10-CM

## 2016-10-18 LAB — COMPREHENSIVE METABOLIC PANEL
ALT: 28 U/L (ref 0–55)
AST: 52 U/L — AB (ref 5–34)
Albumin: 2.8 g/dL — ABNORMAL LOW (ref 3.5–5.0)
Alkaline Phosphatase: 242 U/L — ABNORMAL HIGH (ref 40–150)
Anion Gap: 10 mEq/L (ref 3–11)
BUN: 7.5 mg/dL (ref 7.0–26.0)
CO2: 25 meq/L (ref 22–29)
Calcium: 9.7 mg/dL (ref 8.4–10.4)
Chloride: 94 mEq/L — ABNORMAL LOW (ref 98–109)
Creatinine: 0.6 mg/dL (ref 0.6–1.1)
Glucose: 98 mg/dl (ref 70–140)
POTASSIUM: 4.4 meq/L (ref 3.5–5.1)
SODIUM: 128 meq/L — AB (ref 136–145)
TOTAL PROTEIN: 6.3 g/dL — AB (ref 6.4–8.3)
Total Bilirubin: 0.64 mg/dL (ref 0.20–1.20)

## 2016-10-18 LAB — CBC WITH DIFFERENTIAL/PLATELET
BASO%: 0.3 % (ref 0.0–2.0)
Basophils Absolute: 0.1 10*3/uL (ref 0.0–0.1)
EOS%: 0.2 % (ref 0.0–7.0)
Eosinophils Absolute: 0.1 10*3/uL (ref 0.0–0.5)
HCT: 28.2 % — ABNORMAL LOW (ref 34.8–46.6)
HGB: 8.8 g/dL — ABNORMAL LOW (ref 11.6–15.9)
LYMPH%: 7.1 % — AB (ref 14.0–49.7)
MCH: 26.1 pg (ref 25.1–34.0)
MCHC: 31.2 g/dL — ABNORMAL LOW (ref 31.5–36.0)
MCV: 83.7 fL (ref 79.5–101.0)
MONO#: 1.8 10*3/uL — ABNORMAL HIGH (ref 0.1–0.9)
MONO%: 7.9 % (ref 0.0–14.0)
NEUT%: 84.5 % — ABNORMAL HIGH (ref 38.4–76.8)
NEUTROS ABS: 18.9 10*3/uL — AB (ref 1.5–6.5)
Platelets: 386 10*3/uL (ref 145–400)
RBC: 3.37 10*6/uL — AB (ref 3.70–5.45)
RDW: 19.3 % — AB (ref 11.2–14.5)
WBC: 22.4 10*3/uL — AB (ref 3.9–10.3)
lymph#: 1.6 10*3/uL (ref 0.9–3.3)

## 2016-10-18 LAB — UA PROTEIN, DIPSTICK - CHCC: Protein, ur: NEGATIVE mg/dL

## 2016-10-18 MED ORDER — FAMOTIDINE IN NACL 20-0.9 MG/50ML-% IV SOLN
20.0000 mg | Freq: Two times a day (BID) | INTRAVENOUS | Status: DC
  Administered 2016-10-18: 20 mg via INTRAVENOUS

## 2016-10-18 MED ORDER — SODIUM CHLORIDE 0.9 % IV SOLN
5.0000 mg/kg | Freq: Once | INTRAVENOUS | Status: AC
  Administered 2016-10-18: 325 mg via INTRAVENOUS
  Filled 2016-10-18: qty 13

## 2016-10-18 MED ORDER — SODIUM CHLORIDE 0.9 % IV SOLN
Freq: Once | INTRAVENOUS | Status: AC
  Administered 2016-10-18: 14:00:00 via INTRAVENOUS
  Filled 2016-10-18: qty 5

## 2016-10-18 MED ORDER — ATROPINE SULFATE 1 MG/ML IJ SOLN
0.5000 mg | Freq: Once | INTRAMUSCULAR | Status: AC | PRN
  Administered 2016-10-18: 0.5 mg via INTRAVENOUS

## 2016-10-18 MED ORDER — IRINOTECAN HCL CHEMO INJECTION 100 MG/5ML
170.0000 mg/m2 | Freq: Once | INTRAVENOUS | Status: AC
  Administered 2016-10-18: 300 mg via INTRAVENOUS
  Filled 2016-10-18: qty 15

## 2016-10-18 MED ORDER — ATROPINE SULFATE 1 MG/ML IJ SOLN
INTRAMUSCULAR | Status: AC
  Filled 2016-10-18: qty 1

## 2016-10-18 MED ORDER — FLUOROURACIL CHEMO INJECTION 5 GM/100ML
1800.0000 mg/m2 | INTRAVENOUS | Status: DC
  Administered 2016-10-18: 3150 mg via INTRAVENOUS
  Filled 2016-10-18: qty 63

## 2016-10-18 MED ORDER — LEUCOVORIN CALCIUM INJECTION 350 MG
300.0000 mg/m2 | Freq: Once | INTRAVENOUS | Status: AC
  Administered 2016-10-18: 526 mg via INTRAVENOUS
  Filled 2016-10-18: qty 26.3

## 2016-10-18 MED ORDER — SODIUM CHLORIDE 0.9 % IJ SOLN
10.0000 mL | INTRAMUSCULAR | Status: AC | PRN
  Administered 2016-10-18: 10 mL
  Filled 2016-10-18: qty 10

## 2016-10-18 MED ORDER — SODIUM CHLORIDE 0.9 % IV SOLN
Freq: Once | INTRAVENOUS | Status: AC
  Administered 2016-10-18: 14:00:00 via INTRAVENOUS

## 2016-10-18 MED ORDER — TRAMADOL HCL 50 MG PO TABS: 25.0000 mg | ORAL_TABLET | Freq: Four times a day (QID) | ORAL | 0 refills | Status: DC | PRN

## 2016-10-18 MED ORDER — DIPHENHYDRAMINE HCL 50 MG/ML IJ SOLN
INTRAMUSCULAR | Status: AC
  Filled 2016-10-18: qty 1

## 2016-10-18 MED ORDER — PALONOSETRON HCL INJECTION 0.25 MG/5ML
INTRAVENOUS | Status: AC
  Filled 2016-10-18: qty 5

## 2016-10-18 MED ORDER — FAMOTIDINE IN NACL 20-0.9 MG/50ML-% IV SOLN
INTRAVENOUS | Status: AC
  Filled 2016-10-18: qty 50

## 2016-10-18 MED ORDER — PALONOSETRON HCL INJECTION 0.25 MG/5ML
0.2500 mg | Freq: Once | INTRAVENOUS | Status: AC
Start: 2016-10-18 — End: 2016-10-18
  Administered 2016-10-18: 0.25 mg via INTRAVENOUS

## 2016-10-18 MED ORDER — DIPHENOXYLATE-ATROPINE 2.5-0.025 MG PO TABS
1.0000 | ORAL_TABLET | Freq: Four times a day (QID) | ORAL | 0 refills | Status: DC | PRN
Start: 2016-10-18 — End: 2016-12-08

## 2016-10-18 MED ORDER — FLUOROURACIL CHEMO INJECTION 2.5 GM/50ML
300.0000 mg/m2 | Freq: Once | INTRAVENOUS | Status: AC
  Administered 2016-10-18: 550 mg via INTRAVENOUS
  Filled 2016-10-18: qty 11

## 2016-10-18 MED ORDER — DIPHENHYDRAMINE HCL 50 MG/ML IJ SOLN
25.0000 mg | Freq: Once | INTRAMUSCULAR | Status: AC
  Administered 2016-10-18: 25 mg via INTRAVENOUS

## 2016-10-18 NOTE — Progress Notes (Addendum)
[place] OFFICE PROGRESS NOTE   Diagnosis:  Colon cancer  INTERVAL HISTORY:   Martha Becker returns as scheduled. She has completed 1 cycle of FOLFIRI/Avastin. Treatment was held last week due to a poor performance status. She overall is feeling better as compared to prior to receiving the first cycle of chemotherapy. Leg swelling has improved. Energy level remains poor. She has a good appetite. She continues to have shortness of breath and a cough both of which worsen at bedtime. She takes tramadol at bedtime mainly to help her sleep. She complains of bilateral intermittent "achy" leg pain.  Objective:  Vital signs in last 24 hours:  Blood pressure 115/75, pulse 78, temperature 98.2 F (36.8 C), temperature source Oral, resp. rate 18, height 5' 6"  (1.676 m), weight 144 lb 6.4 oz (65.5 kg), SpO2 98 %.    HEENT: No thrush or ulcers. Resp: Faint rales at the left lung base. Increased respiratory rate. Cardio: Regular rate and rhythm. GI: Abdomen is soft. Fullness medial right upper abdomen, associated tenderness. Vascular: Trace bilateral pretibial/ankle edema.  Port-A-Cath without erythema.  Lab Results:  Lab Results  Component Value Date   WBC 22.4 (H) 10/18/2016   HGB 8.8 (L) 10/18/2016   HCT 28.2 (L) 10/18/2016   MCV 83.7 10/18/2016   PLT 386 10/18/2016   NEUTROABS 18.9 (H) 10/18/2016    Imaging:  No results found.  Medications: I have reviewed the patient's current medications.  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 no loss of mismatch repair protein expression, microsatellite stable ? 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 APC alteration detected ? Staging CT scans December 2015 and PET scan 09/22/2014 consistent with metastatic bilateral lung nodules, metastatic retroperitoneal  lymphadenopathy, and a probable left liver metastasis ? Cycle 1 CAPOX 10/29/2014 (Xeloda discontinued day 9 secondary to diarrhea)  ? CT 11/14/2014 revealed progressive metastatic disease at the lung bases and liver compared to a PET scan from 09/22/2014, mild improvement in retroperitoneal lymphadenopathy ? Cycle 1 FOLFOX 12/02/2014 ? Cycle 2 FOLFOX, Avastin added 12/16/2014 ? Cycle 3 FOLFOX/Avastin 12/30/2014 ? Cycle 4 FOLFOX/Avastin 01/13/2015 ? Cycle 5 FOLFOX/Avastin 01/27/2015  CT 02/10/2015 with improvement in hepatic metastases, mild improvement of lung metastases and retroperitoneal lymphadenopathy  Cycle 6 FOLFOX/Avastin 02/16/2015  Cycle 7 FOLFOX/Avastin 03/02/2015  Cycle 8 FOLFOX/Avastin 03/16/2015  Cycle 9 FOLFOX/Avastin 04/13/2015  Restaging CTs 04/20/2015 with a decrease in the size of pulmonary nodules, liver lesions, and retroperitoneal lymph nodes  Restaging CTs 07/27/2015 revealed enlargement of bilateral lung nodules, stable liver lesions, decreased periaortic adenopathy  CTs 10/05/2015 revealed further enlargement of bilateral lung nodules ,enlargement of liver lesions, and enlargement of peritoneal lymph nodes  Cycle 1 FOLFIRI/Avastin 10/12/2015  Cycle 2 FOLFIRI/Avastin 10/26/2015  Cycle 3 FOLFIRI/Avastin 11/09/2015  Cycle 4 FOLFIRI/Avastin 11/23/2015  Cycle 5 FOLFIRI/Avastin 12/28/2015  CTs 01/06/2016-decreased size of lung nodules , decreased / regression of liver lesions, regression of retroperitoneal nodes  Cycle 6 FOLFIRI/Avastin 01/11/2016 (Neulasta added)   Cycle 7 FOLFIRI/Avastin 02/01/2016 with Neulasta support  Cycle 8 FOLFIRI/Avastin 02/22/2016 with Neulasta support  Cycle 9 FOLFIRI/Avastin 03/13/2016 with Neulasta support  Cycle 10 FOLFIRI/Avastin 04/04/2016 with Neulasta support  Restaging CTs 04/17/2016-decreased size of lung and liver lesions  Patient decided on treatment break  Restaging CTs 08/22/2016-marked progression in  the lungs and liver  Cycle 1 FOLFIRI/Avastin 09/25/2016  Cycle 2 FOLFIRI/Avastin 10/18/2016  2.  Elevated TSH, PET scan 09/22/2014 with diffuse increased metabolic uptake in the thyroid   3. Family history of multiple cancers including colon cancer in her paternal grandfather and rectal cancer in a paternal first cousin  67. Admission 11/14/2014 with nausea and diarrhea, most likely secondary to capecitabine induced enteritis-resolved. CT 11/14/2014 consistent with ileitis  5.Superficial phlebitis left hand/wrist 12/30/2014.  6. Early oxaliplatin neuropathy  7. Pain at the right gluteus and right leg-MRI of the lumbar spine 07/13/2015 confirmed a disc protrusion at L5-S1  8. Diaphoresis, rhinorrhea, shortness of breath and cough following cycle 1 FOLFIRI/Avastin, did not occur following cycle 2  9. Oral mucositis secondary to chemotherapy  10. Nonproductive coughsecondary to metastatic colon cancer  11. Neutropenia secondary to chemotherapy -Neulasta added with cycle 6 FOLFIRI 01/11/2016    Disposition: Martha Becker appears stable. She has completed 1 cycle of FOLFIRI/Avastin. Her performance status is improved as compared to last week. The plan is to proceed with cycle 2 FOLFIRI/Avastin today as scheduled. Neulasta will be held with this cycle due to the elevated neutrophil count.  She will return for a follow-up visit/labs on 10/24/2016. She will contact the office in the interim with any problems.   Patient seen with Dr. Benay Spice.  Ned Card ANP/GNP-BC   10/18/2016  11:31 AM  This was a shared visit with Ned Card. Martha Becker has an improved performance status compared to when we saw her last week. The plan is to proceed with cycle 2 FOLFIRI/Avastin. She will return for an office visit next week.  [name], M.D.

## 2016-10-18 NOTE — Patient Instructions (Signed)
Martha Becker Discharge Instructions for Patients Receiving Chemotherapy  Today you received the following chemotherapy agents:  Leucovorin, 5FU, irinotecan, Avastin  To help prevent nausea and vomiting after your treatment, we encourage you to take your nausea medication.   If you develop nausea and vomiting that is not controlled by your nausea medication, call the clinic.   BELOW ARE SYMPTOMS THAT SHOULD BE REPORTED IMMEDIATELY:  *FEVER GREATER THAN 100.5 F  *CHILLS WITH OR WITHOUT FEVER  NAUSEA AND VOMITING THAT IS NOT CONTROLLED WITH YOUR NAUSEA MEDICATION  *UNUSUAL SHORTNESS OF BREATH  *UNUSUAL BRUISING OR BLEEDING  TENDERNESS IN MOUTH AND THROAT WITH OR WITHOUT PRESENCE OF ULCERS  *URINARY PROBLEMS  *BOWEL PROBLEMS  UNUSUAL RASH Items with * indicate a potential emergency and should be followed up as soon as possible.  Feel free to call the clinic you have any questions or concerns. The clinic phone number is [phone].  Please show the [place] at check-in to the Emergency Department and triage nurse.

## 2016-10-18 NOTE — Telephone Encounter (Signed)
Appointments scheduled per 10/18/16 los. Patient was given a copy of the AVS report and appointment schedule per 10/18/16 los. ° °

## 2016-10-18 NOTE — Telephone Encounter (Signed)
Called in tramadol and lomotil to [facility]

## 2016-10-20 ENCOUNTER — Ambulatory Visit (HOSPITAL_BASED_OUTPATIENT_CLINIC_OR_DEPARTMENT_OTHER): Payer: 59

## 2016-10-20 VITALS — BP 118/84 | HR 90 | Temp 98.6°F | Resp 18

## 2016-10-20 DIAGNOSIS — C786 Secondary malignant neoplasm of retroperitoneum and peritoneum: Secondary | ICD-10-CM

## 2016-10-20 DIAGNOSIS — C182 Malignant neoplasm of ascending colon: Secondary | ICD-10-CM | POA: Diagnosis not present

## 2016-10-20 DIAGNOSIS — C7802 Secondary malignant neoplasm of left lung: Secondary | ICD-10-CM | POA: Diagnosis not present

## 2016-10-20 DIAGNOSIS — C7801 Secondary malignant neoplasm of right lung: Secondary | ICD-10-CM | POA: Diagnosis not present

## 2016-10-20 DIAGNOSIS — C787 Secondary malignant neoplasm of liver and intrahepatic bile duct: Secondary | ICD-10-CM | POA: Diagnosis not present

## 2016-10-20 DIAGNOSIS — C180 Malignant neoplasm of cecum: Secondary | ICD-10-CM

## 2016-10-20 MED ORDER — SODIUM CHLORIDE 0.9% FLUSH
10.0000 mL | INTRAVENOUS | Status: DC | PRN
  Administered 2016-10-20: 10 mL
  Filled 2016-10-20: qty 10

## 2016-10-20 MED ORDER — HEPARIN SOD (PORK) LOCK FLUSH 100 UNIT/ML IV SOLN
500.0000 [IU] | Freq: Once | INTRAVENOUS | Status: AC | PRN
  Administered 2016-10-20: 500 [IU]
  Filled 2016-10-20: qty 5

## 2016-10-20 NOTE — Patient Instructions (Signed)

## 2016-10-23 ENCOUNTER — Other Ambulatory Visit: Payer: 59

## 2016-10-23 ENCOUNTER — Ambulatory Visit: Payer: 59

## 2016-10-23 ENCOUNTER — Ambulatory Visit: Payer: 59 | Admitting: Oncology

## 2016-10-24 ENCOUNTER — Observation Stay (HOSPITAL_COMMUNITY)
Admission: AD | Admit: 2016-10-24 | Discharge: 2016-10-25 | Disposition: A | Discharge status: Home / Self Care | Payer: 59 | Source: Ambulatory Visit | Attending: Oncology | Admitting: Oncology

## 2016-10-24 ENCOUNTER — Ambulatory Visit (HOSPITAL_BASED_OUTPATIENT_CLINIC_OR_DEPARTMENT_OTHER): Payer: 59 | Admitting: Oncology

## 2016-10-24 ENCOUNTER — Other Ambulatory Visit (HOSPITAL_BASED_OUTPATIENT_CLINIC_OR_DEPARTMENT_OTHER): Payer: 59

## 2016-10-24 ENCOUNTER — Ambulatory Visit: Payer: 59 | Admitting: Nurse Practitioner

## 2016-10-24 ENCOUNTER — Ambulatory Visit (HOSPITAL_COMMUNITY)
Admission: RE | Admit: 2016-10-24 | Discharge: 2016-10-24 | Disposition: A | Discharge status: Home / Self Care | Payer: 59 | Source: Ambulatory Visit | Attending: Oncology | Admitting: Oncology

## 2016-10-24 ENCOUNTER — Ambulatory Visit: Payer: 59

## 2016-10-24 VITALS — BP 83/71 | HR 97 | Temp 99.9°F | Resp 20

## 2016-10-24 VITALS — BP 82/56 | HR 90 | Temp 98.1°F | Resp 18 | Ht 66.0 in

## 2016-10-24 DIAGNOSIS — C801 Malignant (primary) neoplasm, unspecified: Secondary | ICD-10-CM | POA: Insufficient documentation

## 2016-10-24 DIAGNOSIS — E871 Hypo-osmolality and hyponatremia: Secondary | ICD-10-CM

## 2016-10-24 DIAGNOSIS — C786 Secondary malignant neoplasm of retroperitoneum and peritoneum: Secondary | ICD-10-CM

## 2016-10-24 DIAGNOSIS — I959 Hypotension, unspecified: Secondary | ICD-10-CM

## 2016-10-24 DIAGNOSIS — Z806 Family history of leukemia: Secondary | ICD-10-CM | POA: Diagnosis not present

## 2016-10-24 DIAGNOSIS — C180 Malignant neoplasm of cecum: Secondary | ICD-10-CM

## 2016-10-24 DIAGNOSIS — D649 Anemia, unspecified: Secondary | ICD-10-CM | POA: Diagnosis present

## 2016-10-24 DIAGNOSIS — Z800 Family history of malignant neoplasm of digestive organs: Secondary | ICD-10-CM | POA: Diagnosis not present

## 2016-10-24 DIAGNOSIS — R0600 Dyspnea, unspecified: Secondary | ICD-10-CM | POA: Insufficient documentation

## 2016-10-24 DIAGNOSIS — Z79899 Other long term (current) drug therapy: Secondary | ICD-10-CM | POA: Diagnosis not present

## 2016-10-24 DIAGNOSIS — C7800 Secondary malignant neoplasm of unspecified lung: Secondary | ICD-10-CM

## 2016-10-24 DIAGNOSIS — Z95828 Presence of other vascular implants and grafts: Secondary | ICD-10-CM

## 2016-10-24 DIAGNOSIS — R0609 Other forms of dyspnea: Secondary | ICD-10-CM | POA: Diagnosis not present

## 2016-10-24 DIAGNOSIS — Z885 Allergy status to narcotic agent status: Secondary | ICD-10-CM | POA: Insufficient documentation

## 2016-10-24 DIAGNOSIS — C787 Secondary malignant neoplasm of liver and intrahepatic bile duct: Secondary | ICD-10-CM | POA: Diagnosis present

## 2016-10-24 DIAGNOSIS — Z9049 Acquired absence of other specified parts of digestive tract: Secondary | ICD-10-CM | POA: Diagnosis not present

## 2016-10-24 DIAGNOSIS — C189 Malignant neoplasm of colon, unspecified: Principal | ICD-10-CM | POA: Insufficient documentation

## 2016-10-24 DIAGNOSIS — Z9221 Personal history of antineoplastic chemotherapy: Secondary | ICD-10-CM | POA: Diagnosis not present

## 2016-10-24 DIAGNOSIS — Z85038 Personal history of other malignant neoplasm of large intestine: Secondary | ICD-10-CM | POA: Diagnosis present

## 2016-10-24 DIAGNOSIS — R5381 Other malaise: Secondary | ICD-10-CM | POA: Diagnosis not present

## 2016-10-24 DIAGNOSIS — R05 Cough: Secondary | ICD-10-CM | POA: Diagnosis not present

## 2016-10-24 DIAGNOSIS — Z87891 Personal history of nicotine dependence: Secondary | ICD-10-CM | POA: Insufficient documentation

## 2016-10-24 LAB — COMPREHENSIVE METABOLIC PANEL
ALBUMIN: 2.7 g/dL — AB (ref 3.5–5.0)
ALK PHOS: 200 U/L — AB (ref 40–150)
ALT: 40 U/L (ref 0–55)
AST: 57 U/L — AB (ref 5–34)
Anion Gap: 11 mEq/L (ref 3–11)
BUN: 13.9 mg/dL (ref 7.0–26.0)
CO2: 20 mEq/L — ABNORMAL LOW (ref 22–29)
CREATININE: 0.6 mg/dL (ref 0.6–1.1)
Calcium: 9.2 mg/dL (ref 8.4–10.4)
Chloride: 92 mEq/L — ABNORMAL LOW (ref 98–109)
EGFR: >90 mL/min/{1.73_m2} (ref >90)
GLUCOSE: 92 mg/dL (ref 70–140)
POTASSIUM: 4.6 meq/L (ref 3.5–5.1)
SODIUM: 123 meq/L — AB (ref 136–145)
TOTAL PROTEIN: 6.1 g/dL — AB (ref 6.4–8.3)
Total Bilirubin: 1.2 mg/dL (ref 0.20–1.20)

## 2016-10-24 LAB — CBC WITH DIFFERENTIAL/PLATELET
BASO%: 0.4 % (ref 0.0–2.0)
Basophils Absolute: 0 10*3/uL (ref 0.0–0.1)
EOS%: 0.2 % (ref 0.0–7.0)
Eosinophils Absolute: 0 10*3/uL (ref 0.0–0.5)
HCT: 25.1 % — ABNORMAL LOW (ref 34.8–46.6)
HEMOGLOBIN: 8.4 g/dL — AB (ref 11.6–15.9)
LYMPH#: 0.6 10*3/uL — AB (ref 0.9–3.3)
LYMPH%: 11.6 % — ABNORMAL LOW (ref 14.0–49.7)
MCH: 26.5 pg (ref 25.1–34.0)
MCHC: 33.5 g/dL (ref 31.5–36.0)
MCV: 79.2 fL — ABNORMAL LOW (ref 79.5–101.0)
MONO#: 0.1 10*3/uL (ref 0.1–0.9)
MONO%: 2.7 % (ref 0.0–14.0)
NEUT%: 85.1 % — ABNORMAL HIGH (ref 38.4–76.8)
NEUTROS ABS: 4.1 10*3/uL (ref 1.5–6.5)
Platelets: 262 10*3/uL (ref 145–400)
RBC: 3.17 10*6/uL — ABNORMAL LOW (ref 3.70–5.45)
RDW: 18.5 % — AB (ref 11.2–14.5)
WBC: 4.8 10*3/uL (ref 3.9–10.3)

## 2016-10-24 LAB — CBC
HCT: 21.4 % — ABNORMAL LOW (ref 36.0–46.0)
Hemoglobin: 7.2 g/dL — ABNORMAL LOW (ref 12.0–15.0)
MCH: 26.3 pg (ref 26.0–34.0)
MCHC: 33.6 g/dL (ref 30.0–36.0)
MCV: 78.1 fL (ref 78.0–100.0)
Platelets: 220 10*3/uL (ref 150–400)
RBC: 2.74 MIL/uL — ABNORMAL LOW (ref 3.87–5.11)
RDW: 18.4 % — AB (ref 11.5–15.5)
WBC: 4.5 10*3/uL (ref 4.0–10.5)

## 2016-10-24 LAB — CREATININE, SERUM
CREATININE: 0.49 mg/dL (ref 0.44–1.00)
GFR calc Af Amer: >60 mL/min (ref >60)
GFR calc non Af Amer: >60 mL/min (ref >60)

## 2016-10-24 LAB — CEA (IN HOUSE-CHCC): CEA (CHCC-In House): 96.44 ng/mL — ABNORMAL HIGH (ref 0.00–5.00)

## 2016-10-24 MED ORDER — SODIUM CHLORIDE 0.9 % IJ SOLN
10.0000 mL | INTRAMUSCULAR | Status: DC | PRN
  Filled 2016-10-24: qty 10

## 2016-10-24 MED ORDER — SODIUM CHLORIDE 0.9 % IV SOLN
INTRAVENOUS | Status: DC
  Administered 2016-10-24: 17:00:00 via INTRAVENOUS

## 2016-10-24 MED ORDER — SODIUM CHLORIDE 0.9 % IV SOLN
INTRAVENOUS | Status: DC
  Administered 2016-10-24: 12:00:00 via INTRAVENOUS

## 2016-10-24 MED ORDER — SODIUM CHLORIDE 0.9 % IJ SOLN
10.0000 mL | INTRAMUSCULAR | Status: AC | PRN
  Administered 2016-10-24: 10 mL
  Filled 2016-10-24: qty 10

## 2016-10-24 MED ORDER — TRAMADOL HCL 50 MG PO TABS
25.0000 mg | ORAL_TABLET | Freq: Four times a day (QID) | ORAL | Status: DC | PRN
  Administered 2016-10-24: 50 mg via ORAL
  Filled 2016-10-24: qty 1

## 2016-10-24 MED ORDER — ONDANSETRON HCL 4 MG PO TABS: 4.0000 mg | ORAL_TABLET | Freq: Four times a day (QID) | ORAL | Status: DC | PRN

## 2016-10-24 MED ORDER — ENOXAPARIN SODIUM 40 MG/0.4ML ~~LOC~~ SOLN: 40.0000 mg | SUBCUTANEOUS | Status: DC

## 2016-10-24 MED ORDER — ACETAMINOPHEN 325 MG PO TABS: 650.0000 mg | ORAL_TABLET | Freq: Four times a day (QID) | ORAL | Status: DC | PRN

## 2016-10-24 MED ORDER — POLYETHYLENE GLYCOL 3350 17 G PO PACK: 17.0000 g | PACK | Freq: Every day | ORAL | Status: DC | PRN

## 2016-10-24 MED ORDER — PROCHLORPERAZINE MALEATE 10 MG PO TABS: 10.0000 mg | ORAL_TABLET | Freq: Four times a day (QID) | ORAL | Status: DC | PRN

## 2016-10-24 MED ORDER — HEPARIN SOD (PORK) LOCK FLUSH 100 UNIT/ML IV SOLN
500.0000 [IU] | INTRAVENOUS | Status: DC | PRN
  Filled 2016-10-24: qty 5

## 2016-10-24 NOTE — Progress Notes (Addendum)
[place] OFFICE PROGRESS NOTE   Diagnosis: Colon cancer  INTERVAL HISTORY:   Martha Becker continued another cycle of FOLFIRI/Avastin 10/18/2016. Martha Becker denies mouth sores, diarrhea, and nausea. Martha Becker reports a good appetite. Martha Becker complains of malaise and exertional dyspnea. Martha Becker becomes fatigued with minimal activity in her home. Martha Becker continues to have a cough.  Objective:  Vital signs in last 24 hours:  Blood pressure (!) 82/56, pulse 90, temperature 98.1 F (36.7 C), temperature source Oral, resp. rate 18, height 5' 6"  (1.676 m), SpO2 99 %.    HEENT: No thrush or ulcers Resp: Scattered wheezes, decreased breath sounds at the lower chest, no respiratory distress at rest Cardio: Regular rate and rhythm GI: Fullness in the right upper abdomen, nontender Vascular: No leg edema, diminished skin turgor   Portacath/PICC-without erythema  Lab Results:  Lab Results  Component Value Date   WBC 4.8 10/24/2016   HGB 8.4 (L) 10/24/2016   HCT 25.1 (L) 10/24/2016   MCV 79.2 (L) 10/24/2016   PLT 262 10/24/2016   NEUTROABS 4.1 10/24/2016    Sodium 123, creatinine 0.6, potassium 4.6, BUN 13.9, albumin 2.7  Medications: I have reviewed the patient's current medications.  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 no loss of mismatch repair protein expression, microsatellite stable ? 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 APC alteration detected ? Staging CT scans December 2015 and PET scan 09/22/2014 consistent with metastatic bilateral lung nodules, metastatic retroperitoneal lymphadenopathy, and a probable left liver metastasis ? Cycle 1 CAPOX 10/29/2014 (Xeloda discontinued day 9 secondary to diarrhea)  ? CT 11/14/2014 revealed progressive metastatic disease at the lung bases and liver compared to a PET scan  from 09/22/2014, mild improvement in retroperitoneal lymphadenopathy ? Cycle 1 FOLFOX 12/02/2014 ? Cycle 2 FOLFOX, Avastin added 12/16/2014 ? Cycle 3 FOLFOX/Avastin 12/30/2014 ? Cycle 4 FOLFOX/Avastin 01/13/2015 ? Cycle 5 FOLFOX/Avastin 01/27/2015  CT 02/10/2015 with improvement in hepatic metastases, mild improvement of lung metastases and retroperitoneal lymphadenopathy  Cycle 6 FOLFOX/Avastin 02/16/2015  Cycle 7 FOLFOX/Avastin 03/02/2015  Cycle 8 FOLFOX/Avastin 03/16/2015  Cycle 9 FOLFOX/Avastin 04/13/2015  Restaging CTs 04/20/2015 with a decrease in the size of pulmonary nodules, liver lesions, and retroperitoneal lymph nodes  Restaging CTs 07/27/2015 revealed enlargement of bilateral lung nodules, stable liver lesions, decreased periaortic adenopathy  CTs 10/05/2015 revealed further enlargement of bilateral lung nodules ,enlargement of liver lesions, and enlargement of peritoneal lymph nodes  Cycle 1 FOLFIRI/Avastin 10/12/2015  Cycle 2 FOLFIRI/Avastin 10/26/2015  Cycle 3 FOLFIRI/Avastin 11/09/2015  Cycle 4 FOLFIRI/Avastin 11/23/2015  Cycle 5 FOLFIRI/Avastin 12/28/2015  CTs 01/06/2016-decreased size of lung nodules , decreased / regression of liver lesions, regression of retroperitoneal nodes  Cycle 6 FOLFIRI/Avastin 01/11/2016 (Neulasta added)   Cycle 7 FOLFIRI/Avastin 02/01/2016 with Neulasta support  Cycle 8 FOLFIRI/Avastin 02/22/2016 with Neulasta support  Cycle 9 FOLFIRI/Avastin 03/13/2016 with Neulasta support  Cycle 10 FOLFIRI/Avastin 04/04/2016 with Neulasta support  Restaging CTs 04/17/2016-decreased size of lung and liver lesions  Patient decided on treatment break  Restaging CTs 08/22/2016-marked progression in the lungs and liver  Cycle 1 FOLFIRI/Avastin 09/25/2016  Cycle 2 FOLFIRI/Avastin 10/18/2016  2. Elevated TSH, PET scan 09/22/2014 with diffuse increased metabolic uptake in the thyroid   3. Family history of multiple cancers  including colon cancer in her paternal grandfather and rectal cancer in a paternal first cousin  31. Admission 11/14/2014 with nausea and diarrhea, most  likely secondary to capecitabine induced enteritis-resolved. CT 11/14/2014 consistent with ileitis  5.Superficial phlebitis left hand/wrist 12/30/2014.  6. Early oxaliplatin neuropathy  7. Pain at the right gluteus and right leg-MRI of the lumbar spine 07/13/2015 confirmed a disc protrusion at L5-S1  8. Diaphoresis, rhinorrhea, shortness of breath and cough following cycle 1 FOLFIRI/Avastin, did not occur following cycle 2  9. Oral mucositis secondary to chemotherapy  10. Nonproductive coughsecondary to metastatic colon cancer  11. Neutropenia secondary to chemotherapy -Neulasta added with cycle 6 FOLFIRI 01/11/2016    Martha Becker is now at day 7 following a second cycle of FOLFIRI/Avastin. Her performance status has declined. Martha Becker appears dehydrated. Martha Becker will receive intravenous fluids and we will obtain a chest x-ray today. I suspect the change in her performance status is related to disease progression.  We will reassess her status after receiving intravenous fluids and review of the chest x-ray today. I will discuss Hospice care with her pending the chest x-ray result.  30 minutes were spent with the patient today. The majority of the time was used for counseling and coordination of care.  Addendum:  Martha Becker had persistent hypotension following intravenous fluids. Martha Becker will be admitted to the inpatient oncology unit.

## 2016-10-24 NOTE — Addendum Note (Signed)
Addended by: Betsy Coder B on: 10/24/2016 04:33 PM   Modules accepted: Level of Service

## 2016-10-24 NOTE — H&P (Signed)
Patient History and Physical   Axa Santori [id number] Jul 14, 1956 61 y.o. 10/24/2016    Patient Identification: 61 year old with metastatic colon cancer   HPI:  Ms. Hierro has a history of metastatic colon cancer, currently being treated with FOLFIRI/Avastin chemotherapy. She completed the last cycle of chemotherapy on 10/18/2016. She presented to the office today with progressive malaise and exertional dyspnea. She becomes exhausted with minimal activity in her home. She accompanied today by her son. She denies fever. She reports a good appetite and adequate intake of fluids. She continues to have a cough. Tramadol helps the cough in the evening.  She was noted to be hypotensive upon arrival to the [facility]. The electrolytes returned abnormal. She received intravenous fluids and remained hypotensive with profound malaise. She will be admitted for further evaluation.    PMH:  Past Medical History:  Diagnosis Date  . Colon cancer ([place]) 08/2014   with mets to liver  . Hypothyroidism     Past Surgical History:  Procedure Laterality Date  . CESAREAN SECTION    . LIVER BIOPSY N/A 08/31/2014   Procedure: LIVER BIOPSY;  Surgeon: Jamesetta So, MD;  Location: [facility];  Service: General;  Laterality: N/A;  . PARTIAL COLECTOMY N/A 08/31/2014   Procedure: Right Colon Resection ;  Surgeon: Jamesetta So, MD;  Location: [facility];  Service: General;  Laterality: N/A;  . PORTACATH PLACEMENT Right 11/30/2014   Procedure: INSERTION PORT-A-CATH right subclavian;  Surgeon: Aviva Signs Md, MD;  Location: [facility];  Service: General;  Laterality: Right;    Allergies:  Allergies  Allergen Reactions  . Codeine Nausea And Vomiting    Medications:  Medications Prior to Admission  Medication Sig Dispense Refill  . Ascorbic Acid (VITAMIN C) 1000 MG tablet Take 1,000 mg by mouth every other day.    . benzonatate (TESSALON) 100 MG capsule Take 1 capsule (100 mg total) by mouth 3 (three) times  daily as needed for cough. (Patient not taking: Reported on 10/18/2016) 20 capsule 0  . diphenoxylate-atropine (LOMOTIL) 2.5-0.025 MG tablet Take 1 tablet by mouth 4 (four) times daily as needed for diarrhea or loose stools. 30 tablet 0  . levothyroxine (SYNTHROID) 50 MCG tablet Take 1 tablet (50 mcg total) by mouth daily before breakfast. (Patient not taking: Reported on 10/18/2016) 30 tablet 2  . loperamide (IMODIUM) 2 MG capsule Take 2-4 mg by mouth as needed for diarrhea or loose stools.    Marland Kitchen loratadine (CLARITIN) 10 MG tablet Take 10 mg by mouth daily.    . ondansetron (ZOFRAN) 4 MG tablet Take 1 tablet (4 mg total) by mouth every 6 (six) hours as needed for nausea or vomiting. 30 tablet 1  . prochlorperazine (COMPAZINE) 10 MG tablet Take 1 tablet (10 mg total) by mouth every 6 (six) hours as needed for nausea or vomiting. 30 tablet 0  . senna (SENOKOT) 8.6 MG tablet Take 2 tablets by mouth daily.     . traMADol (ULTRAM) 50 MG tablet Take 0.5-1 tablets (25-50 mg total) by mouth every 6 (six) hours as needed. 40 tablet 0  . Turmeric 500 MG CAPS Take by mouth 2 (two) times daily. Takes once a day at times      Social History:   She lives alone in [place]. Her son is currently staying with her. 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 She quit smoking cigarettes in her 52s. She reports social alcohol use. No risk factor for HIV or  hepatitis.   Family History:Her paternal grandfather had colon cancer and died at age 33. A paternal first cousin had rectal cancer. Her maternal uncle had "leukemia ".   Review of Systems:   Positives include: Cough, exertional dyspnea, malaise  A complete ROS was otherwise negative.   Physical Exam:  There were no vitals taken for this visit.  HEENT: No thrush or ulcers Lungs: Decreased breath sounds at the lower chest bilaterally, no respiratory distress at rest, good air movement bilaterally Cardiac: Regular rate and rhythm Abdomen:  No splenomegaly. The liver edge is palpable in the right upper abdomen.  Vascular: No leg edema Neurologic: Alert and oriented, the motor exam appears intact in the upper and lower extremities Skin: No rash, diminished skin turgor   Lab Results:  Lab Results  Component Value Date   WBC 4.8 10/24/2016   HGB 8.4 (L) 10/24/2016   HCT 25.1 (L) 10/24/2016   MCV 79.2 (L) 10/24/2016   PLT 262 10/24/2016   NEUTROABS 4.1 10/24/2016   Sodium 123, potassium 4.6, BUN 13.9, creatinine 0.6, albumin 2.7, bilirubin 1.2  Radiological Studies:  Dg Chest 2 View  Result Date: 10/24/2016 CLINICAL DATA:  Short of breath and weakness. History of cecal carcinoma with lung Mets. EXAM: CHEST  2 VIEW COMPARISON:  Chest CT, 08/22/2016.  Older chest radiographs. FINDINGS: There are numerous bilateral lung nodules and masses reflecting widespread pulmonary metastatic disease, similar in extent to the prior chest CT. There is no convincing pneumonia no evidence of pulmonary edema. No pleural effusion.  No pneumothorax. Cardiac silhouette is borderline enlarged. Lung masses/ nodules are superimposed on the hila. No convincing mediastinal masses. Right anterior chest wall Port-A-Cath has its tip in the lower superior vena cava. Skeletal structures are unremarkable. IMPRESSION: 1. No acute cardiopulmonary disease. 2. Extensive metastatic disease to the lungs similar to what was present on the prior chest CT. Electronically Signed   By: [name] M.D.   On: 10/24/2016 16:08     Impression and Plan:  1. Metastatic colon cancer, initial diagnosis 09/08/2014, currently being treated with FOLFIRI/Avastin-last cycle 10/18/2016  2. Exertional dyspnea and cough secondary to extensive lung metastases  3. Hypotension-most likely secured to dehydration  Ms. Grondahl has advanced metastatic colon cancer. There are extensive liver metastases. She completed a second cycle of salvage therapy with FOLFIRI/Avastin on 10/18/2016.  She presents today with progressive failure to thrive characterized by increased exertional dyspnea and profound malaise. I suspect her symptoms are secondary to progression of the metastatic colon cancer. She does not have symptoms typical of toxicity from the FOLFIRI/Avastin.  Her performance status is poor. She will be admitted for supportive care measures . We will continue discussions of hospice care versus continuing salvage therapy.  I discussed CPR and ACLS issues with Ms. Aronov. She would like to remain on a full CODE STATUS for now.     Betsy Coder, MD  10/24/2016, 5:13 PM

## 2016-10-24 NOTE — Progress Notes (Signed)
RN visit for IV fluids  Transported to Radiology for CXR per order Dr. Benay Spice. Transported via w/c per NT Pt returned to [facility] after CXR  VS retaken after 1.5 liters .9NS-remains hypotensive and orthostatic and also now febrile @ 99.9.  Dr. Benay Spice made aware.  Decision made to admit to # [place].  Discussed with patient and her family and they are in agreement.  Patient placement notified. Bed available on [address]. 1336. Report called to RN. Transported via w/c per this RN.    Port dressing changed to have biopatch and sorbview dressing. Flushed and capped.    Family accompanied pt

## 2016-10-25 ENCOUNTER — Other Ambulatory Visit: Payer: Self-pay | Admitting: Nurse Practitioner

## 2016-10-25 DIAGNOSIS — C787 Secondary malignant neoplasm of liver and intrahepatic bile duct: Secondary | ICD-10-CM

## 2016-10-25 DIAGNOSIS — C7800 Secondary malignant neoplasm of unspecified lung: Secondary | ICD-10-CM

## 2016-10-25 DIAGNOSIS — R0609 Other forms of dyspnea: Secondary | ICD-10-CM | POA: Diagnosis not present

## 2016-10-25 DIAGNOSIS — R5381 Other malaise: Secondary | ICD-10-CM | POA: Diagnosis not present

## 2016-10-25 DIAGNOSIS — C189 Malignant neoplasm of colon, unspecified: Secondary | ICD-10-CM | POA: Diagnosis not present

## 2016-10-25 DIAGNOSIS — D649 Anemia, unspecified: Secondary | ICD-10-CM | POA: Diagnosis present

## 2016-10-25 DIAGNOSIS — I959 Hypotension, unspecified: Secondary | ICD-10-CM | POA: Diagnosis not present

## 2016-10-25 DIAGNOSIS — C786 Secondary malignant neoplasm of retroperitoneum and peritoneum: Secondary | ICD-10-CM

## 2016-10-25 DIAGNOSIS — C180 Malignant neoplasm of cecum: Secondary | ICD-10-CM

## 2016-10-25 DIAGNOSIS — C801 Malignant (primary) neoplasm, unspecified: Secondary | ICD-10-CM

## 2016-10-25 LAB — TSH: TSH: 11.256 u[IU]/mL — ABNORMAL HIGH (ref 0.350–4.500)

## 2016-10-25 LAB — BASIC METABOLIC PANEL
ANION GAP: 6 (ref 5–15)
BUN: 11 mg/dL (ref 6–20)
CALCIUM: 8 mg/dL — AB (ref 8.9–10.3)
CO2: 22 mmol/L (ref 22–32)
CREATININE: 0.5 mg/dL (ref 0.44–1.00)
Chloride: 97 mmol/L — ABNORMAL LOW (ref 101–111)
GFR calc Af Amer: >60 mL/min (ref >60)
GLUCOSE: 108 mg/dL — AB (ref 65–99)
Potassium: 4.3 mmol/L (ref 3.5–5.1)
Sodium: 125 mmol/L — ABNORMAL LOW (ref 135–145)

## 2016-10-25 LAB — ABO/RH: ABO/RH(D): A POS

## 2016-10-25 LAB — PREPARE RBC (CROSSMATCH)

## 2016-10-25 LAB — MAGNESIUM: Magnesium: 1.7 mg/dL (ref 1.7–2.4)

## 2016-10-25 MED ORDER — HEPARIN SOD (PORK) LOCK FLUSH 100 UNIT/ML IV SOLN
500.0000 [IU] | Freq: Once | INTRAVENOUS | Status: AC
  Administered 2016-10-25: 500 [IU] via INTRAVENOUS
  Filled 2016-10-25: qty 5

## 2016-10-25 MED ORDER — SODIUM CHLORIDE 0.9 % IV SOLN
Freq: Once | INTRAVENOUS | Status: AC
  Administered 2016-10-25: 12:00:00 via INTRAVENOUS

## 2016-10-25 NOTE — Care Management CC44 (Signed)
Condition Code 44 Documentation Completed  Patient Details  Name: Martha Becker MRN: [id number] Date of Birth: [birth year]   Condition Code 44 given:  Yes Patient signature on Condition Code 44 notice:  Yes Documentation of 2 MD's agreement:  Yes Code 44 added to claim:  Yes    Lynnell Catalan, RN 10/25/2016, 4:32 PM

## 2016-10-25 NOTE — Discharge Summary (Signed)
Physician Discharge Summary  Patient ID: Martha Becker @ATTENDINGNPI @ MRN: [id number] DOB/AGE: 59-Jun-1957 59 y.o.  Admit date: 10/24/2016 Discharge date: 10/25/2016    Discharge Diagnoses:  Active Problems:   Metastatic colon cancer to liver [facility])   Discharged Condition: stable  Discharge Labs:  sodium 125, potassium 4.3, BUN 11, creatinine 0.5, TSH 11  Significant Diagnostic Studies: Chest x-ray 10/24/2016-extensive metastatic disease to the lungs, similar to prior chest CT  Consults: None  Procedures: Red blood cell transfusion 10/25/2016  Disposition: Discharged to home   Allergies as of 10/25/2016      Reactions   Codeine Nausea And Vomiting      Medication List    TAKE these medications   diphenoxylate-atropine 2.5-0.025 MG tablet Commonly known as:  LOMOTIL Take 1 tablet by mouth 4 (four) times daily as needed for diarrhea or loose stools.   levothyroxine 50 MCG tablet Commonly known as:  SYNTHROID Take 1 tablet (50 mcg total) by mouth daily before breakfast.   loperamide 2 MG capsule Commonly known as:  IMODIUM Take 2-4 mg by mouth as needed for diarrhea or loose stools.   ondansetron 4 MG tablet Commonly known as:  ZOFRAN Take 1 tablet (4 mg total) by mouth every 6 (six) hours as needed for nausea or vomiting.   prochlorperazine 10 MG tablet Commonly known as:  COMPAZINE Take 1 tablet (10 mg total) by mouth every 6 (six) hours as needed for nausea or vomiting.   senna 8.6 MG tablet Commonly known as:  SENOKOT Take 2 tablets by mouth daily.   traMADol 50 MG tablet Commonly known as:  ULTRAM Take 0.5-1 tablets (25-50 mg total) by mouth every 6 (six) hours as needed. What changed:  how much to take  when to take this       Follow-up Information    Betsy Coder, MD Follow up.   Specialty:  Oncology Why:  Appointment will be scheduled for 11/01/2016 Contact information: [address] [place] [address] [phone]            Hospital Course: Martha Becker is a 62 year old woman with metastatic colon cancer. She is currently on active treatment with FOLFIRI/Avastin. She completed cycle 2 on 10/18/2016. She was seen in the office for routine follow-up 10/24/2016. She was noted to be hypotensive and electrolytes were abnormal. She received IV fluids but remained hypotensive with profound malaise. She was subsequently admitted for further evaluation.  The hypotension was felt to most likely be related to dehydration. Intravenous hydration was initiated with improvement in the blood pressure.  Hemoglobin on admission was 7.2. She was transfused 2 units of blood on 10/25/2016.  She will be discharged home following completion of the blood transfusion 10/25/2016. She will return for a follow-up visit on 11/01/2016. We will have further discussion regarding hospice care versus continuation of salvage therapy at the time of her next visit.      Signed: Ned Card 10/25/2016, 3:38 PM

## 2016-10-25 NOTE — Discharge Instructions (Signed)
Call for increased shortness of breath

## 2016-10-25 NOTE — Care Management Obs Status (Signed)
[name] NOTIFICATION   Patient Details  Name: Martha Becker MRN: [id number] Date of Birth: 25-May-1956   Medicare Observation Status Notification Given:  Yes    Lynnell Catalan, RN 10/25/2016, 4:32 PM

## 2016-10-26 LAB — TYPE AND SCREEN
ABO/RH(D): A POS
ANTIBODY SCREEN: NEGATIVE
UNIT DIVISION: 0
Unit division: 0

## 2016-11-01 ENCOUNTER — Other Ambulatory Visit (HOSPITAL_BASED_OUTPATIENT_CLINIC_OR_DEPARTMENT_OTHER): Payer: 59

## 2016-11-01 ENCOUNTER — Ambulatory Visit (HOSPITAL_BASED_OUTPATIENT_CLINIC_OR_DEPARTMENT_OTHER): Payer: 59 | Admitting: Nurse Practitioner

## 2016-11-01 ENCOUNTER — Telehealth: Payer: Self-pay | Admitting: Nurse Practitioner

## 2016-11-01 ENCOUNTER — Telehealth: Payer: Self-pay | Admitting: *Deleted

## 2016-11-01 VITALS — BP 105/71 | HR 78 | Temp 98.0°F | Resp 21 | Ht 66.0 in | Wt 141.1 lb

## 2016-11-01 DIAGNOSIS — C189 Malignant neoplasm of colon, unspecified: Secondary | ICD-10-CM

## 2016-11-01 DIAGNOSIS — C7800 Secondary malignant neoplasm of unspecified lung: Secondary | ICD-10-CM | POA: Diagnosis not present

## 2016-11-01 DIAGNOSIS — G620 Drug-induced polyneuropathy: Secondary | ICD-10-CM | POA: Diagnosis not present

## 2016-11-01 DIAGNOSIS — C180 Malignant neoplasm of cecum: Secondary | ICD-10-CM

## 2016-11-01 DIAGNOSIS — Z8672 Personal history of thrombophlebitis: Secondary | ICD-10-CM | POA: Diagnosis not present

## 2016-11-01 DIAGNOSIS — D649 Anemia, unspecified: Secondary | ICD-10-CM

## 2016-11-01 DIAGNOSIS — E860 Dehydration: Secondary | ICD-10-CM

## 2016-11-01 DIAGNOSIS — Z800 Family history of malignant neoplasm of digestive organs: Secondary | ICD-10-CM | POA: Diagnosis not present

## 2016-11-01 DIAGNOSIS — C801 Malignant (primary) neoplasm, unspecified: Secondary | ICD-10-CM

## 2016-11-01 DIAGNOSIS — E871 Hypo-osmolality and hyponatremia: Secondary | ICD-10-CM

## 2016-11-01 DIAGNOSIS — C787 Secondary malignant neoplasm of liver and intrahepatic bile duct: Secondary | ICD-10-CM | POA: Diagnosis not present

## 2016-11-01 DIAGNOSIS — C786 Secondary malignant neoplasm of retroperitoneum and peritoneum: Secondary | ICD-10-CM

## 2016-11-01 DIAGNOSIS — K121 Other forms of stomatitis: Secondary | ICD-10-CM | POA: Diagnosis not present

## 2016-11-01 LAB — COMPREHENSIVE METABOLIC PANEL
ALT: 40 U/L (ref 0–55)
AST: 43 U/L — ABNORMAL HIGH (ref 5–34)
Albumin: 2.9 g/dL — ABNORMAL LOW (ref 3.5–5.0)
Alkaline Phosphatase: 247 U/L — ABNORMAL HIGH (ref 40–150)
Anion Gap: 10 mEq/L (ref 3–11)
BUN: 11.7 mg/dL (ref 7.0–26.0)
CHLORIDE: 95 meq/L — AB (ref 98–109)
CO2: 26 meq/L (ref 22–29)
CREATININE: 0.7 mg/dL (ref 0.6–1.1)
Calcium: 9.9 mg/dL (ref 8.4–10.4)
EGFR: >90 mL/min/{1.73_m2} (ref >90)
Glucose: 112 mg/dl (ref 70–140)
Potassium: 4.2 mEq/L (ref 3.5–5.1)
SODIUM: 131 meq/L — AB (ref 136–145)
TOTAL PROTEIN: 6.4 g/dL (ref 6.4–8.3)
Total Bilirubin: 0.4 mg/dL (ref 0.20–1.20)

## 2016-11-01 LAB — CBC WITH DIFFERENTIAL/PLATELET
BASO%: 0.7 % (ref 0.0–2.0)
Basophils Absolute: 0 10*3/uL (ref 0.0–0.1)
EOS%: 3.3 % (ref 0.0–7.0)
Eosinophils Absolute: 0.1 10*3/uL (ref 0.0–0.5)
HCT: 32.4 % — ABNORMAL LOW (ref 34.8–46.6)
HGB: 10.3 g/dL — ABNORMAL LOW (ref 11.6–15.9)
LYMPH%: 25.3 % (ref 14.0–49.7)
MCH: 27.1 pg (ref 25.1–34.0)
MCHC: 31.8 g/dL (ref 31.5–36.0)
MCV: 85.3 fL (ref 79.5–101.0)
MONO#: 0.7 10*3/uL (ref 0.1–0.9)
MONO%: 26.8 % — ABNORMAL HIGH (ref 0.0–14.0)
NEUT%: 43.9 % (ref 38.4–76.8)
NEUTROS ABS: 1.2 10*3/uL — AB (ref 1.5–6.5)
Platelets: 405 10*3/uL — ABNORMAL HIGH (ref 145–400)
RBC: 3.8 10*6/uL (ref 3.70–5.45)
RDW: 18.4 % — ABNORMAL HIGH (ref 11.2–14.5)
WBC: 2.7 10*3/uL — AB (ref 3.9–10.3)
lymph#: 0.7 10*3/uL — ABNORMAL LOW (ref 0.9–3.3)

## 2016-11-01 NOTE — Telephone Encounter (Signed)
Appointments scheduled per 2/21 LOS. Patient given AVS report and calendars with future schedueled appointments.

## 2016-11-01 NOTE — Telephone Encounter (Signed)
Per 2/21 LOS and staff message I have scheduled appts. Gave calendar and notified the scheduler 

## 2016-11-01 NOTE — Progress Notes (Addendum)
[place] OFFICE PROGRESS NOTE   Diagnosis:  Colon cancer  INTERVAL HISTORY:   Martha Becker returns as scheduled. She completed cycle 2 FOLFIRI/Avastin 10/18/2016. She was hospitalized 10/24/2016 with hypotension despite intravenous hydration. The blood pressure improved during the hospitalization. She was transfused 2 units of blood. She was discharged home 10/25/2016.  She overall is feeling better. She has to rest between activities. She coughs intermittently. She has dyspnea on exertion. She reports a good appetite.  Objective:  Vital signs in last 24 hours:  Blood pressure 105/71, pulse 78, temperature 98 F (36.7 C), temperature source Oral, resp. rate (!) 21, height 5' 6"  (1.676 m), weight 141 lb 1.6 oz (64 kg), SpO2 97 %.    HEENT: No thrush or ulcers. Resp: Lungs clear bilaterally. Cardio: Regular rate and rhythm. GI: Abdomen with fullness in the right upper abdomen with associated tenderness. Vascular: No leg edema. Port-A-Cath without erythema.   Lab Results:  Lab Results  Component Value Date   WBC 2.7 (L) 11/01/2016   HGB 10.3 (L) 11/01/2016   HCT 32.4 (L) 11/01/2016   MCV 85.3 11/01/2016   PLT 405 (H) 11/01/2016   NEUTROABS 1.2 (L) 11/01/2016    Imaging:  No results found.  Medications: I have reviewed the patient's current medications.  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 no loss of mismatch repair protein expression, microsatellite stable ? 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 APC alteration detected ? Staging CT scans December 2015 and PET scan 09/22/2014 consistent with metastatic bilateral lung nodules, metastatic retroperitoneal lymphadenopathy, and a probable left liver metastasis ? Cycle 1 CAPOX 10/29/2014 (Xeloda discontinued day 9 secondary to  diarrhea)  ? CT 11/14/2014 revealed progressive metastatic disease at the lung bases and liver compared to a PET scan from 09/22/2014, mild improvement in retroperitoneal lymphadenopathy ? Cycle 1 FOLFOX 12/02/2014 ? Cycle 2 FOLFOX, Avastin added 12/16/2014 ? Cycle 3 FOLFOX/Avastin 12/30/2014 ? Cycle 4 FOLFOX/Avastin 01/13/2015 ? Cycle 5 FOLFOX/Avastin 01/27/2015  CT 02/10/2015 with improvement in hepatic metastases, mild improvement of lung metastases and retroperitoneal lymphadenopathy  Cycle 6 FOLFOX/Avastin 02/16/2015  Cycle 7 FOLFOX/Avastin 03/02/2015  Cycle 8 FOLFOX/Avastin 03/16/2015  Cycle 9 FOLFOX/Avastin 04/13/2015  Restaging CTs 04/20/2015 with a decrease in the size of pulmonary nodules, liver lesions, and retroperitoneal lymph nodes  Restaging CTs 07/27/2015 revealed enlargement of bilateral lung nodules, stable liver lesions, decreased periaortic adenopathy  CTs 10/05/2015 revealed further enlargement of bilateral lung nodules ,enlargement of liver lesions, and enlargement of peritoneal lymph nodes  Cycle 1 FOLFIRI/Avastin 10/12/2015  Cycle 2 FOLFIRI/Avastin 10/26/2015  Cycle 3 FOLFIRI/Avastin 11/09/2015  Cycle 4 FOLFIRI/Avastin 11/23/2015  Cycle 5 FOLFIRI/Avastin 12/28/2015  CTs 01/06/2016-decreased size of lung nodules , decreased / regression of liver lesions, regression of retroperitoneal nodes  Cycle 6 FOLFIRI/Avastin 01/11/2016 (Neulasta added)   Cycle 7 FOLFIRI/Avastin 02/01/2016 with Neulasta support  Cycle 8 FOLFIRI/Avastin 02/22/2016 with Neulasta support  Cycle 9 FOLFIRI/Avastin 03/13/2016 with Neulasta support  Cycle 10 FOLFIRI/Avastin 04/04/2016 with Neulasta support  Restaging CTs 04/17/2016-decreased size of lung and liver lesions  Patient decided on treatment break  Restaging CTs 08/22/2016-marked progression in the lungs and liver  Cycle 1 FOLFIRI/Avastin 09/25/2016  Cycle 2 FOLFIRI/Avastin 10/18/2016  2. Elevated TSH, PET  scan 09/22/2014 with diffuse increased metabolic uptake in the thyroid   3. Family history of multiple cancers including colon cancer in her paternal  grandfather and rectal cancer in a paternal first cousin  93. Admission 11/14/2014 with nausea and diarrhea, most likely secondary to capecitabine induced enteritis-resolved. CT 11/14/2014 consistent with ileitis  5.Superficial phlebitis left hand/wrist 12/30/2014.  6. Early oxaliplatin neuropathy  7. Pain at the right gluteus and right leg-MRI of the lumbar spine 07/13/2015 confirmed a disc protrusion at L5-S1  8. Diaphoresis, rhinorrhea, shortness of breath and cough following cycle 1 FOLFIRI/Avastin, did not occur following cycle 2  9. Oral mucositis secondary to chemotherapy  10. Nonproductive coughsecondary to metastatic colon cancer  11. Neutropenia secondary to chemotherapy -Neulasta added with cycle 6 FOLFIRI 01/11/2016  12.  Hospitalization 10/24/2016 through 10/25/2016 with hypotension related to dehydration, anemia  13.  Hyponatremia. 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 Improved 11/01/2016.     Disposition: Martha Becker has completed 2 cycles of FOLFIRI/Avastin. She was hospitalized last week with dehydration and anemia. Performance status today appears somewhat improved. Dr. Benay Spice reviewed options to include a hospice referral versus proceeding with a third cycle of FOLFIRI/Avastin followed by a restaging CT evaluation. The decision was to proceed with a third cycle of FOLFIRI/Avastin next week. We will see her in follow-up prior to proceeding with cycle 3 FOLFIRI/Avastin on 11/08/2016. She will contact the office in the interim with any problems.  Patient seen with Dr. Benay Spice. 25 minutes were spent face-to-face at today's visit with the majority of that time involved in counseling/coordination of care.    Ned Card ANP/GNP-BC   11/01/2016  10:04 AM  This was a  shared visit with Ned Card. Martha Becker has an improved performance status since beginning chemotherapy and compared to discharge from the hospital last week. We discussed supportive care versus continuing chemotherapy with Martha Becker and her son. We decided to proceed with one more cycle of FOLFIRI/Avastin prior to a restaging CT evaluation.  [name], M.D.

## 2016-11-05 ENCOUNTER — Other Ambulatory Visit: Payer: Self-pay | Admitting: Oncology

## 2016-11-08 ENCOUNTER — Ambulatory Visit: Payer: 59

## 2016-11-08 ENCOUNTER — Ambulatory Visit (HOSPITAL_BASED_OUTPATIENT_CLINIC_OR_DEPARTMENT_OTHER): Payer: 59 | Admitting: Nurse Practitioner

## 2016-11-08 ENCOUNTER — Ambulatory Visit (HOSPITAL_BASED_OUTPATIENT_CLINIC_OR_DEPARTMENT_OTHER): Payer: 59

## 2016-11-08 ENCOUNTER — Other Ambulatory Visit (HOSPITAL_BASED_OUTPATIENT_CLINIC_OR_DEPARTMENT_OTHER): Payer: 59

## 2016-11-08 VITALS — BP 110/79 | HR 87 | Temp 98.0°F | Resp 18 | Ht 66.0 in | Wt 139.2 lb

## 2016-11-08 DIAGNOSIS — K1231 Oral mucositis (ulcerative) due to antineoplastic therapy: Secondary | ICD-10-CM

## 2016-11-08 DIAGNOSIS — C189 Malignant neoplasm of colon, unspecified: Secondary | ICD-10-CM

## 2016-11-08 DIAGNOSIS — Z5111 Encounter for antineoplastic chemotherapy: Secondary | ICD-10-CM | POA: Diagnosis not present

## 2016-11-08 DIAGNOSIS — Z800 Family history of malignant neoplasm of digestive organs: Secondary | ICD-10-CM | POA: Diagnosis not present

## 2016-11-08 DIAGNOSIS — C182 Malignant neoplasm of ascending colon: Secondary | ICD-10-CM

## 2016-11-08 DIAGNOSIS — C787 Secondary malignant neoplasm of liver and intrahepatic bile duct: Secondary | ICD-10-CM | POA: Diagnosis not present

## 2016-11-08 DIAGNOSIS — Z95828 Presence of other vascular implants and grafts: Secondary | ICD-10-CM

## 2016-11-08 DIAGNOSIS — C7800 Secondary malignant neoplasm of unspecified lung: Secondary | ICD-10-CM | POA: Diagnosis not present

## 2016-11-08 DIAGNOSIS — Z5112 Encounter for antineoplastic immunotherapy: Secondary | ICD-10-CM

## 2016-11-08 DIAGNOSIS — Z8672 Personal history of thrombophlebitis: Secondary | ICD-10-CM | POA: Diagnosis not present

## 2016-11-08 DIAGNOSIS — G620 Drug-induced polyneuropathy: Secondary | ICD-10-CM

## 2016-11-08 DIAGNOSIS — C180 Malignant neoplasm of cecum: Secondary | ICD-10-CM

## 2016-11-08 LAB — COMPREHENSIVE METABOLIC PANEL
ALT: 20 U/L (ref 0–55)
ANION GAP: 12 meq/L — AB (ref 3–11)
AST: 34 U/L (ref 5–34)
Albumin: 3.3 g/dL — ABNORMAL LOW (ref 3.5–5.0)
Alkaline Phosphatase: 263 U/L — ABNORMAL HIGH (ref 40–150)
BUN: 7.9 mg/dL (ref 7.0–26.0)
CHLORIDE: 94 meq/L — AB (ref 98–109)
CO2: 25 meq/L (ref 22–29)
Calcium: 10 mg/dL (ref 8.4–10.4)
Creatinine: 0.7 mg/dL (ref 0.6–1.1)
Glucose: 104 mg/dl (ref 70–140)
POTASSIUM: 4.1 meq/L (ref 3.5–5.1)
Sodium: 131 mEq/L — ABNORMAL LOW (ref 136–145)
Total Bilirubin: 0.52 mg/dL (ref 0.20–1.20)
Total Protein: 7 g/dL (ref 6.4–8.3)

## 2016-11-08 LAB — CBC WITH DIFFERENTIAL/PLATELET
BASO%: 0.6 % (ref 0.0–2.0)
Basophils Absolute: 0.1 10*3/uL (ref 0.0–0.1)
EOS ABS: 0.1 10*3/uL (ref 0.0–0.5)
EOS%: 0.9 % (ref 0.0–7.0)
HCT: 34.6 % — ABNORMAL LOW (ref 34.8–46.6)
HGB: 11.3 g/dL — ABNORMAL LOW (ref 11.6–15.9)
LYMPH%: 13.3 % — AB (ref 14.0–49.7)
MCH: 27.4 pg (ref 25.1–34.0)
MCHC: 32.7 g/dL (ref 31.5–36.0)
MCV: 83.8 fL (ref 79.5–101.0)
MONO#: 1.4 10*3/uL — AB (ref 0.1–0.9)
MONO%: 15.9 % — ABNORMAL HIGH (ref 0.0–14.0)
NEUT#: 6 10*3/uL (ref 1.5–6.5)
NEUT%: 69.3 % (ref 38.4–76.8)
PLATELETS: 365 10*3/uL (ref 145–400)
RBC: 4.13 10*6/uL (ref 3.70–5.45)
RDW: 17.5 % — ABNORMAL HIGH (ref 11.2–14.5)
WBC: 8.7 10*3/uL (ref 3.9–10.3)
lymph#: 1.2 10*3/uL (ref 0.9–3.3)

## 2016-11-08 LAB — TECHNOLOGIST REVIEW

## 2016-11-08 LAB — CEA (IN HOUSE-CHCC): CEA (CHCC-In House): 77.64 ng/mL — ABNORMAL HIGH (ref 0.00–5.00)

## 2016-11-08 MED ORDER — DEXTROSE 5 % IV SOLN
300.0000 mg/m2 | Freq: Once | INTRAVENOUS | Status: AC
  Administered 2016-11-08: 526 mg via INTRAVENOUS
  Filled 2016-11-08: qty 26.3

## 2016-11-08 MED ORDER — ATROPINE SULFATE 1 MG/ML IJ SOLN
INTRAMUSCULAR | Status: AC
  Filled 2016-11-08: qty 1

## 2016-11-08 MED ORDER — FAMOTIDINE IN NACL 20-0.9 MG/50ML-% IV SOLN
20.0000 mg | Freq: Two times a day (BID) | INTRAVENOUS | Status: DC
  Administered 2016-11-08: 20 mg via INTRAVENOUS

## 2016-11-08 MED ORDER — DIPHENHYDRAMINE HCL 50 MG/ML IJ SOLN
INTRAMUSCULAR | Status: AC
  Filled 2016-11-08: qty 1

## 2016-11-08 MED ORDER — SODIUM CHLORIDE 0.9 % IV SOLN
1800.0000 mg/m2 | INTRAVENOUS | Status: DC
  Administered 2016-11-08: 3150 mg via INTRAVENOUS
  Filled 2016-11-08: qty 63

## 2016-11-08 MED ORDER — IRINOTECAN HCL CHEMO INJECTION 100 MG/5ML
170.0000 mg/m2 | Freq: Once | INTRAVENOUS | Status: AC
  Administered 2016-11-08: 300 mg via INTRAVENOUS
  Filled 2016-11-08: qty 15

## 2016-11-08 MED ORDER — ATROPINE SULFATE 1 MG/ML IJ SOLN
0.5000 mg | Freq: Once | INTRAMUSCULAR | Status: AC | PRN
  Administered 2016-11-08: 0.5 mg via INTRAVENOUS

## 2016-11-08 MED ORDER — SODIUM CHLORIDE 0.9 % IJ SOLN
10.0000 mL | INTRAMUSCULAR | Status: AC | PRN
Start: 2016-11-08 — End: 2016-11-08
  Administered 2016-11-08: 10 mL
  Filled 2016-11-08: qty 10

## 2016-11-08 MED ORDER — DIPHENHYDRAMINE HCL 50 MG/ML IJ SOLN
25.0000 mg | Freq: Once | INTRAMUSCULAR | Status: AC
  Administered 2016-11-08: 25 mg via INTRAVENOUS

## 2016-11-08 MED ORDER — PALONOSETRON HCL INJECTION 0.25 MG/5ML
INTRAVENOUS | Status: AC
  Filled 2016-11-08: qty 5

## 2016-11-08 MED ORDER — SODIUM CHLORIDE 0.9 % IV SOLN
5.0000 mg/kg | Freq: Once | INTRAVENOUS | Status: AC
  Administered 2016-11-08: 325 mg via INTRAVENOUS
  Filled 2016-11-08: qty 13

## 2016-11-08 MED ORDER — FAMOTIDINE IN NACL 20-0.9 MG/50ML-% IV SOLN
INTRAVENOUS | Status: AC
  Filled 2016-11-08: qty 50

## 2016-11-08 MED ORDER — FLUOROURACIL CHEMO INJECTION 2.5 GM/50ML
300.0000 mg/m2 | Freq: Once | INTRAVENOUS | Status: AC
Start: 2016-11-08 — End: 2016-11-08
  Administered 2016-11-08: 550 mg via INTRAVENOUS
  Filled 2016-11-08: qty 11

## 2016-11-08 MED ORDER — PALONOSETRON HCL INJECTION 0.25 MG/5ML
0.2500 mg | Freq: Once | INTRAVENOUS | Status: AC
  Administered 2016-11-08: 0.25 mg via INTRAVENOUS

## 2016-11-08 MED ORDER — SODIUM CHLORIDE 0.9 % IV SOLN
Freq: Once | INTRAVENOUS | Status: AC
  Administered 2016-11-08: 12:00:00 via INTRAVENOUS
  Filled 2016-11-08: qty 5

## 2016-11-08 MED ORDER — SODIUM CHLORIDE 0.9 % IV SOLN
Freq: Once | INTRAVENOUS | Status: AC
  Administered 2016-11-08: 12:00:00 via INTRAVENOUS

## 2016-11-08 NOTE — Patient Instructions (Signed)
[facility] Discharge Instructions for Patients Receiving Chemotherapy  Return to [facility] on Friday, 11/10/2016 @ 1:15 pm for pump disconnect and infusion.  Today you received the following chemotherapy agents:  Irinotecan, Leucovorin, and 5FU.  To help prevent nausea and vomiting after your treatment, we encourage you to take your nausea medication as directed.   If you develop nausea and vomiting that is not controlled by your nausea medication, call the clinic.   BELOW ARE SYMPTOMS THAT SHOULD BE REPORTED IMMEDIATELY:  *FEVER GREATER THAN 100.5 F  *CHILLS WITH OR WITHOUT FEVER  NAUSEA AND VOMITING THAT IS NOT CONTROLLED WITH YOUR NAUSEA MEDICATION  *UNUSUAL SHORTNESS OF BREATH  *UNUSUAL BRUISING OR BLEEDING  TENDERNESS IN MOUTH AND THROAT WITH OR WITHOUT PRESENCE OF ULCERS  *URINARY PROBLEMS  *BOWEL PROBLEMS  UNUSUAL RASH Items with * indicate a potential emergency and should be followed up as soon as possible.  Feel free to call the clinic you have any questions or concerns. The clinic phone number is [phone].  Please show the [facility] at check-in to the Emergency Department and triage nurse.

## 2016-11-08 NOTE — Progress Notes (Addendum)
[place] OFFICE PROGRESS NOTE   Diagnosis:  Colon cancer  INTERVAL HISTORY:   Martha Becker returns as scheduled. She continues to feel better. She reports a good appetite. Energy level has improved though she continues to note that she fatigues easily. Cough and shortness of breath are better. She notes "drainage" in the throat especially when she lays flat. She is losing her hair.  Objective:  Vital signs in last 24 hours:  Blood pressure 110/79, pulse 87, temperature 98 F (36.7 C), temperature source Oral, resp. rate 18, height '5\' 6"'$  (1.676 m), weight 139 lb 3.2 oz (63.1 kg), SpO2 96 %.    HEENT: No thrush or ulcers. Resp: Expiratory wheezes right lung field. No respiratory distress. Cardio: Regular rate and rhythm. GI: Liver is palpable right upper to mid abdomen with associated tenderness. Vascular: No leg edema. Neuro: Alert and oriented.  Skin: No rash. Port-A-Cath without erythema.    Lab Results:  Lab Results  Component Value Date   WBC 8.7 11/08/2016   HGB 11.3 (L) 11/08/2016   HCT 34.6 (L) 11/08/2016   MCV 83.8 11/08/2016   PLT 365 11/08/2016   NEUTROABS 6.0 11/08/2016    Imaging:  No results found.  Medications: I have reviewed the patient's current medications.  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 no loss of mismatch repair protein expression, microsatellite stable ? 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 APC alteration detected ? Staging CT scans December 2015 and PET scan 09/22/2014 consistent with metastatic bilateral lung nodules, metastatic retroperitoneal lymphadenopathy, and a probable left liver metastasis ? Cycle 1 CAPOX 10/29/2014 (Xeloda discontinued day 9 secondary to diarrhea)  ? CT 11/14/2014 revealed progressive metastatic disease at the lung  bases and liver compared to a PET scan from 09/22/2014, mild improvement in retroperitoneal lymphadenopathy ? Cycle 1 FOLFOX 12/02/2014 ? Cycle 2 FOLFOX, Avastin added 12/16/2014 ? Cycle 3 FOLFOX/Avastin 12/30/2014 ? Cycle 4 FOLFOX/Avastin 01/13/2015 ? Cycle 5 FOLFOX/Avastin 01/27/2015  CT 02/10/2015 with improvement in hepatic metastases, mild improvement of lung metastases and retroperitoneal lymphadenopathy  Cycle 6 FOLFOX/Avastin 02/16/2015  Cycle 7 FOLFOX/Avastin 03/02/2015  Cycle 8 FOLFOX/Avastin 03/16/2015  Cycle 9 FOLFOX/Avastin 04/13/2015  Restaging CTs 04/20/2015 with a decrease in the size of pulmonary nodules, liver lesions, and retroperitoneal lymph nodes  Restaging CTs 07/27/2015 revealed enlargement of bilateral lung nodules, stable liver lesions, decreased periaortic adenopathy  CTs 10/05/2015 revealed further enlargement of bilateral lung nodules ,enlargement of liver lesions, and enlargement of peritoneal lymph nodes  Cycle 1 FOLFIRI/Avastin 10/12/2015  Cycle 2 FOLFIRI/Avastin 10/26/2015  Cycle 3 FOLFIRI/Avastin 11/09/2015  Cycle 4 FOLFIRI/Avastin 11/23/2015  Cycle 5 FOLFIRI/Avastin 12/28/2015  CTs 01/06/2016-decreased size of lung nodules , decreased / regression of liver lesions, regression of retroperitoneal nodes  Cycle 6 FOLFIRI/Avastin 01/11/2016 (Neulasta added)   Cycle 7 FOLFIRI/Avastin 02/01/2016 with Neulasta support  Cycle 8 FOLFIRI/Avastin 02/22/2016 with Neulasta support  Cycle 9 FOLFIRI/Avastin 03/13/2016 with Neulasta support  Cycle 10 FOLFIRI/Avastin 04/04/2016 with Neulasta support  Restaging CTs 04/17/2016-decreased size of lung and liver lesions  Patient decided on treatment break  Restaging CTs 08/22/2016-marked progression in the lungs and liver  Cycle 1 FOLFIRI/Avastin 09/25/2016  Cycle 2 FOLFIRI/Avastin 10/18/2016  Cycle 3 FOLFIRI/Avastin 11/08/2016  2. Elevated TSH, PET scan 09/22/2014 with diffuse increased  metabolic uptake in the thyroid   3. Family history of multiple cancers including colon cancer in  her paternal grandfather and rectal cancer in a paternal first cousin  21. Admission 11/14/2014 with nausea and diarrhea, most likely secondary to capecitabine induced enteritis-resolved. CT 11/14/2014 consistent with ileitis  5.Superficial phlebitis left hand/wrist 12/30/2014.  6. Early oxaliplatin neuropathy  7. Pain at the right gluteus and right leg-MRI of the lumbar spine 07/13/2015 confirmed a disc protrusion at L5-S1  8. Diaphoresis, rhinorrhea, shortness of breath and cough following cycle 1 FOLFIRI/Avastin, did not occur following cycle 2  9. Oral mucositis secondary to chemotherapy  10. Nonproductive coughsecondary to metastatic colon cancer  11. Neutropenia secondary to chemotherapy -Neulasta added with cycle 6 FOLFIRI 01/11/2016  12.  Hospitalization 10/24/2016 through 10/25/2016 with hypotension related to dehydration, anemia. Hemoglobin improved 11/08/2016.  13.  Hyponatremia. 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 Improved 11/01/2016. Stable 11/08/2016.    Disposition: Martha Becker appears stable. She has completed 2 cycles of FOLFIRI/Avastin. Performance status continues to be improved. Plan to proceed with cycle 3 today as scheduled. She will receive Neulasta and IV fluids on the day of pump discontinuation. We are referring her for restaging CT scans in 2 weeks. She will return for a follow-up visit in 3 weeks. She will contact the office in the interim with any problems.  Patient seen with Dr. Benay Spice. 25 minutes were spent face-to-face at today's visit with the majority of that time involved in counseling/coordination of care.    Martha Becker ANP/GNP-BC   11/08/2016  11:47 AM  This was a shared visit with Martha Becker. Martha Becker will complete cycle 3 Folfiri/Avastin today and then be scheduled for restaging CTs.     [name], Md

## 2016-11-08 NOTE — Patient Instructions (Signed)

## 2016-11-09 IMAGING — CT CT ABD-PELV W/ CM
3 of 6 series · 6 of 46 positions shown, 11 images · IV contrast (iopamidol)
Comparison: CT 01/06/2016 and 10/05/2015

CLINICAL DATA: Restaging cecal cancer diagnosed in August 2014
with metastatic disease to the liver. Previous resection and
chemotherapy. No current complaints.

EXAM:
CT CHEST, ABDOMEN, AND PELVIS WITH CONTRAST
TECHNIQUE: Multidetector CT imaging of the chest, abdomen and pelvis was
performed following the standard protocol during bolus
administration of intravenous contrast.
CONTRAST:  100mL FEM10Q-UPP IOPAMIDOL (FEM10Q-UPP) INJECTION 61%

[Series 3: coronal cor · coronal · 0.78mm/px · 3 of 55 slices shown, 4 images]
[im 19/55  soft-tissue]
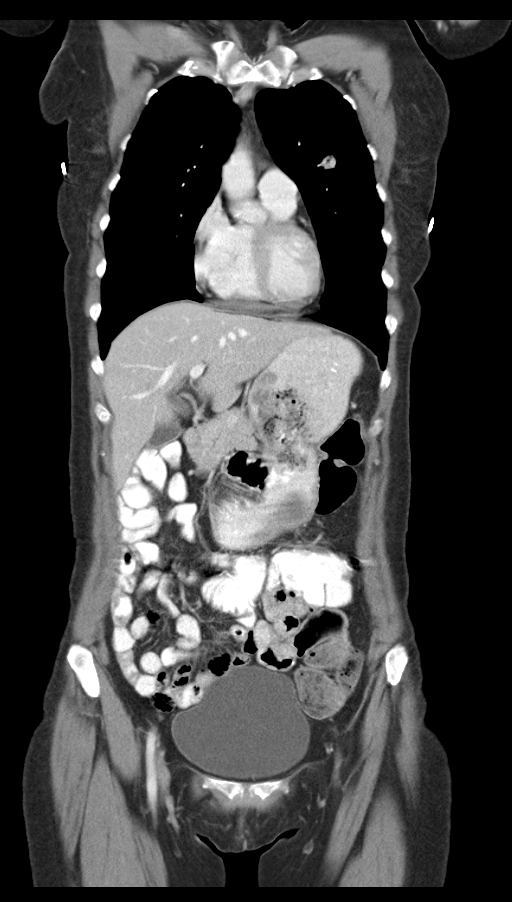
[im 25/55  soft-tissue]
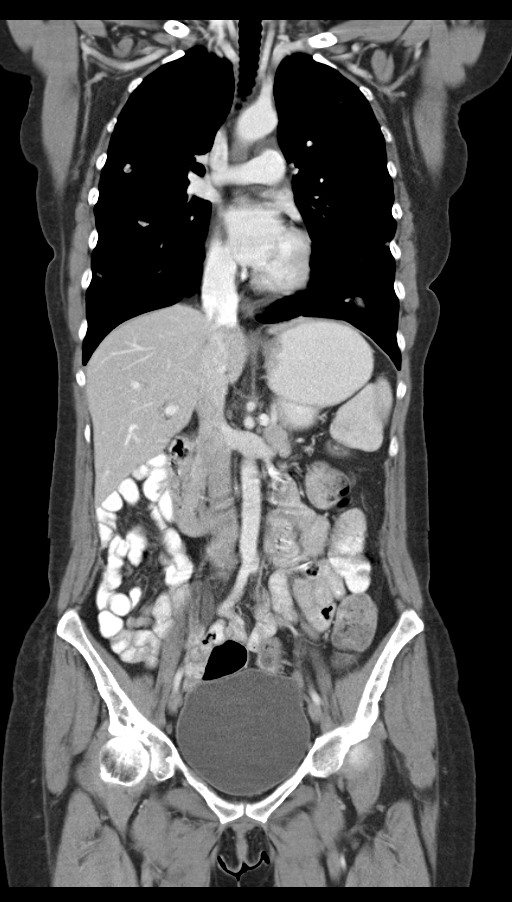
[im 25/55  bone]
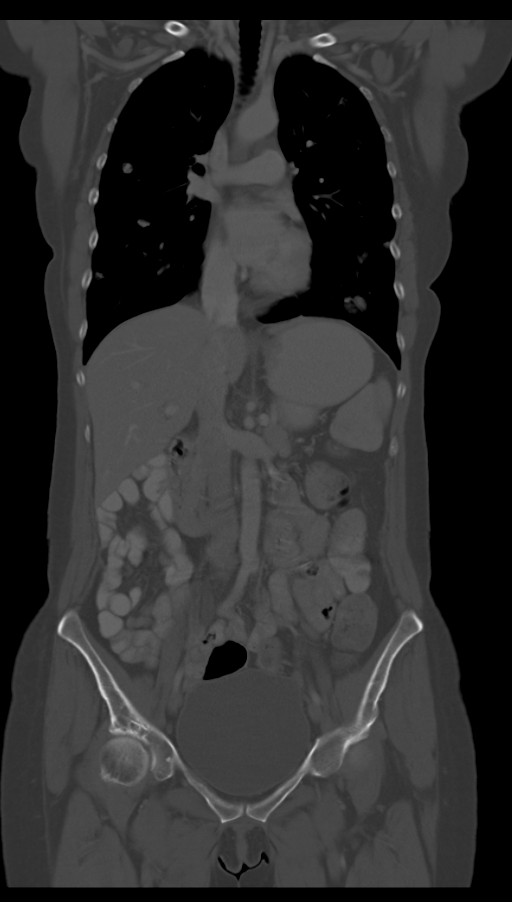
[im 31/55  soft-tissue]
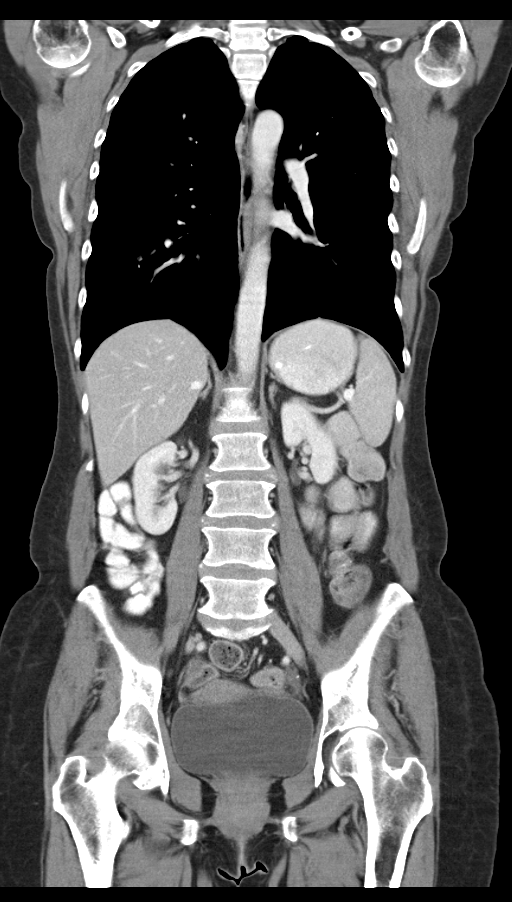

[Series 4: sagittal · sagittal · 0.56mm/px · 1 of 79 slices shown, 2 images]
[im 27/79  soft-tissue]
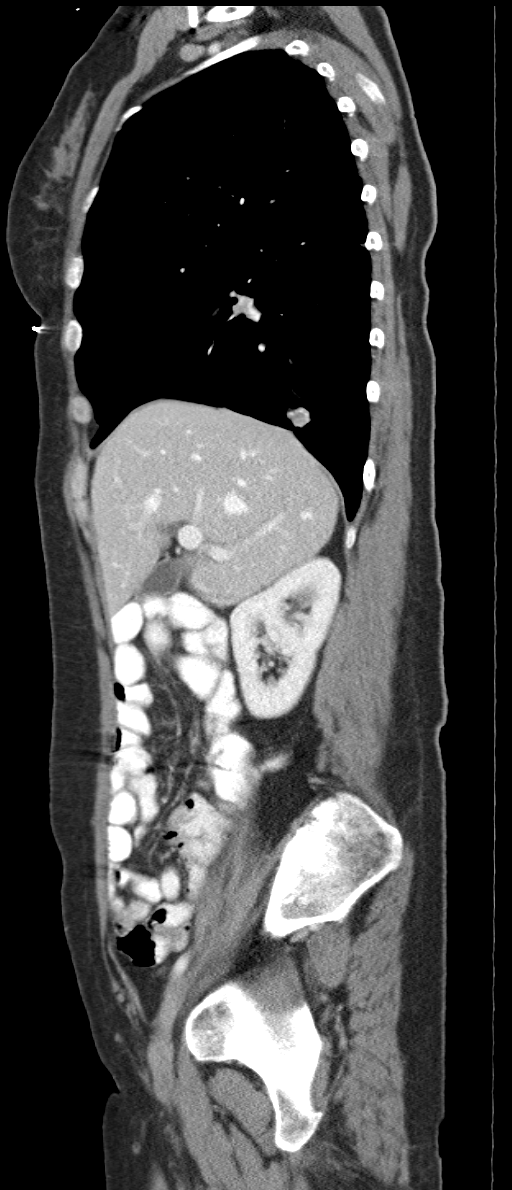
[im 27/79  bone]
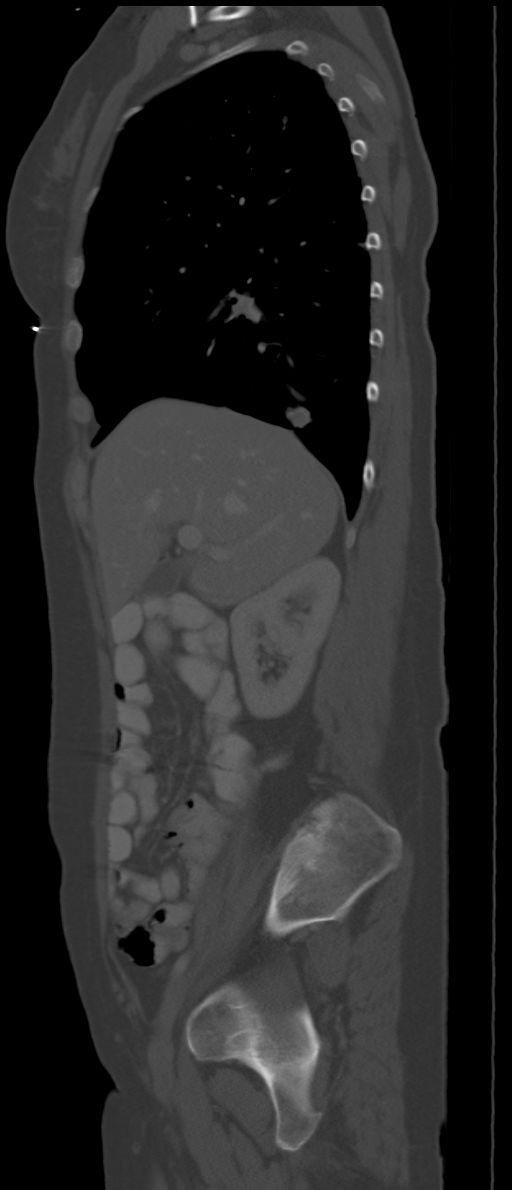

[Series 9: kidney delays · axial · 0.76mm/px · z∈[-639,-554]mm · 2 of 51 slices shown, 5 images]
[im 17/51  soft-tissue]
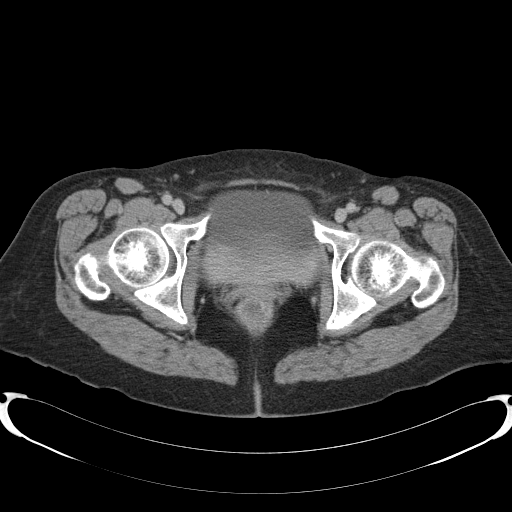
[im 17/51  lung]
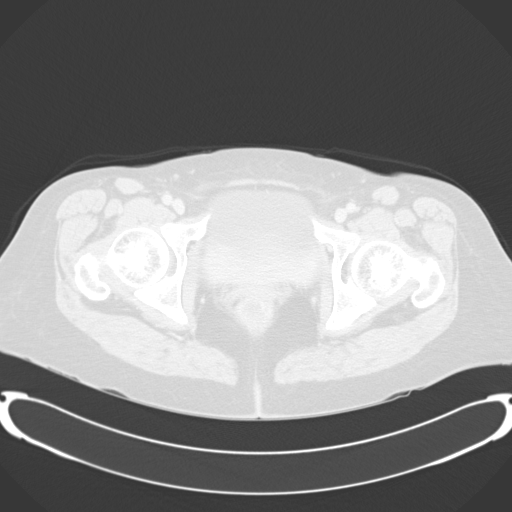
[im 17/51  bone]
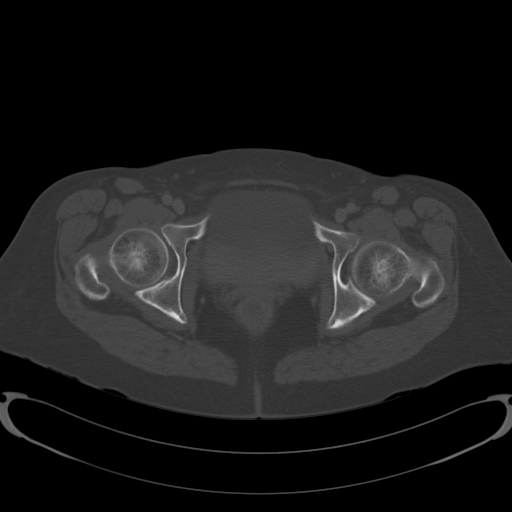
[im 34/51  soft-tissue]
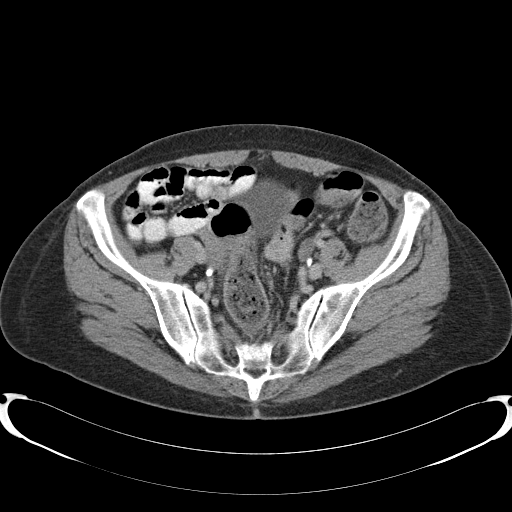
[im 34/51  lung]
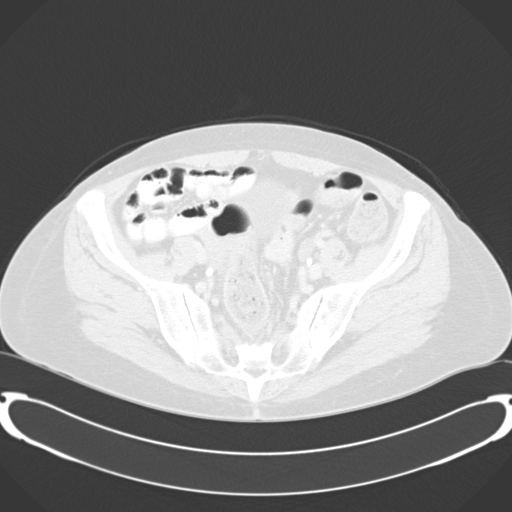

[6 of 46 positions shown; findings below may reference images not displayed]

FINDINGS: CT CHEST FINDINGS

Cardiovascular: There are no significant vascular findings. Right
subclavian Port-A-Cath tip extends to the lower SVC. The heart size
is normal. There is no significant pericardial effusion.

Mediastinum/Nodes: There are no enlarged mediastinal, hilar or
axillary lymph nodes. The thyroid gland, trachea and esophagus
demonstrate no significant findings.

Lungs/Pleura: There is no pleural effusion. There has been continued
slow improvement in the numerous cavitary pulmonary nodules and
masses bilaterally. There are more than 20 pulmonary nodules
bilaterally. The previously referenced left lower lobe nodule
measures 2.9 x 1.3 cm on image 94 (previously 3.4 x 1.8 cm). No
definite enlarging nodules, endobronchial lesion or confluent
airspace opacity.

Musculoskeletal/Chest wall: No chest wall mass or suspicious osseous
findings.

CT ABDOMEN AND PELVIS FINDINGS

Hepatobiliary: The dominant hypovascular lesion in segment to
demonstrates continued improvement, measuring 2.1 x 1.9 cm on image
51 of series 2 (previously 2.4 x 1.9 cm). This lesion demonstrates
residual peripheral enhancement versus calcification. No other
definite residual suspicious lesions are identified. There are
scattered well-circumscribed low-density hepatic lesions which are
stable and likely cysts. No evidence of gallstones, gallbladder wall
thickening or biliary dilatation.

Pancreas: Unremarkable. No pancreatic ductal dilatation or
surrounding inflammatory changes.

Spleen: Normal in size without focal abnormality.

Adrenals/Urinary Tract: Both adrenal glands appear normal. The
kidneys appear normal without evidence of urinary tract calculus,
suspicious lesion or hydronephrosis. No bladder abnormalities are
seen.

Stomach/Bowel: No evidence of bowel wall thickening, distention or
surrounding inflammatory change. Stable postsurgical changes status
post right hemicolectomy.

Vascular/Lymphatic: Stable small residual retroperitoneal lymph
nodes, measuring up to 7 mm on image 71. These appear partially
calcified. No progressive adenopathy. No significant vascular
findings are present.

Reproductive: The uterus and ovaries appear normal. No evidence of
adnexal mass.

Other: No ascites or peritoneal nodularity.

Musculoskeletal: No acute or significant osseous findings.
IMPRESSION: 1. Continued slow improvement in the multifocal metastatic disease
throughout both lungs.
2. Continued slow regression in the dominant residual metastasis
within segment 2 of the liver. No other definite residual hepatic
disease.
3. No evidence of disease progression or acute findings.

## 2016-11-10 ENCOUNTER — Other Ambulatory Visit: Payer: Self-pay | Admitting: Nurse Practitioner

## 2016-11-10 ENCOUNTER — Ambulatory Visit: Payer: 59

## 2016-11-10 ENCOUNTER — Ambulatory Visit (HOSPITAL_BASED_OUTPATIENT_CLINIC_OR_DEPARTMENT_OTHER): Payer: 59

## 2016-11-10 ENCOUNTER — Other Ambulatory Visit: Payer: Self-pay | Admitting: *Deleted

## 2016-11-10 VITALS — BP 142/90 | HR 80 | Temp 99.2°F | Resp 18

## 2016-11-10 DIAGNOSIS — C787 Secondary malignant neoplasm of liver and intrahepatic bile duct: Secondary | ICD-10-CM

## 2016-11-10 DIAGNOSIS — C801 Malignant (primary) neoplasm, unspecified: Secondary | ICD-10-CM

## 2016-11-10 DIAGNOSIS — C180 Malignant neoplasm of cecum: Secondary | ICD-10-CM

## 2016-11-10 DIAGNOSIS — C7800 Secondary malignant neoplasm of unspecified lung: Secondary | ICD-10-CM

## 2016-11-10 DIAGNOSIS — C786 Secondary malignant neoplasm of retroperitoneum and peritoneum: Secondary | ICD-10-CM

## 2016-11-10 DIAGNOSIS — E860 Dehydration: Secondary | ICD-10-CM | POA: Diagnosis not present

## 2016-11-10 DIAGNOSIS — C182 Malignant neoplasm of ascending colon: Secondary | ICD-10-CM | POA: Diagnosis not present

## 2016-11-10 MED ORDER — SODIUM CHLORIDE 0.9 % IV SOLN
INTRAVENOUS | Status: DC
  Administered 2016-11-10: 14:00:00 via INTRAVENOUS

## 2016-11-10 MED ORDER — PEGFILGRASTIM INJECTION 6 MG/0.6ML ~~LOC~~
6.0000 mg | PREFILLED_SYRINGE | Freq: Once | SUBCUTANEOUS | Status: AC
  Administered 2016-11-10: 6 mg via SUBCUTANEOUS
  Filled 2016-11-10: qty 0.6

## 2016-11-10 MED ORDER — SODIUM CHLORIDE 0.9% FLUSH
10.0000 mL | INTRAVENOUS | Status: DC | PRN
  Administered 2016-11-10: 10 mL
  Filled 2016-11-10: qty 10

## 2016-11-10 MED ORDER — HEPARIN SOD (PORK) LOCK FLUSH 100 UNIT/ML IV SOLN
500.0000 [IU] | Freq: Once | INTRAVENOUS | Status: AC | PRN
  Administered 2016-11-10: 500 [IU]
  Filled 2016-11-10: qty 5

## 2016-11-10 NOTE — Patient Instructions (Addendum)
Implanted Port Home Guide An implanted port is a type of central line that is placed under the skin. Central lines are used to provide IV access when treatment or nutrition needs to be given through a person's veins. Implanted ports are used for long-term IV access. An implanted port may be placed because:   You need IV medicine that would be irritating to the small veins in your hands or arms.   You need long-term IV medicines, such as antibiotics.   You need IV nutrition for a long period.   You need frequent blood draws for lab tests.   You need dialysis.  Implanted ports are usually placed in the chest area, but they can also be placed in the upper arm, the abdomen, or the leg. An implanted port has two main parts:   Reservoir. The reservoir is round and will appear as a small, raised area under your skin. The reservoir is the part where a needle is inserted to give medicines or draw blood.   Catheter. The catheter is a thin, flexible tube that extends from the reservoir. The catheter is placed into a large vein. Medicine that is inserted into the reservoir goes into the catheter and then into the vein.  HOW WILL I CARE FOR MY INCISION SITE? Do not get the incision site wet. Bathe or shower as directed by your health care provider.  HOW IS MY PORT ACCESSED? Special steps must be taken to access the port:   Before the port is accessed, a numbing cream can be placed on the skin. This helps numb the skin over the port site.   Your health care provider uses a sterile technique to access the port.  Your health care provider must put on a mask and sterile gloves.  The skin over your port is cleaned carefully with an antiseptic and allowed to dry.  The port is gently pinched between sterile gloves, and a needle is inserted into the port.  Only "non-coring" port needles should be used to access the port. Once the port is accessed, a blood return should be checked. This helps  ensure that the port is in the vein and is not clogged.   If your port needs to remain accessed for a constant infusion, a clear (transparent) bandage will be placed over the needle site. The bandage and needle will need to be changed every week, or as directed by your health care provider.   Keep the bandage covering the needle clean and dry. Do not get it wet. Follow your health care provider's instructions on how to take a shower or bath while the port is accessed.   If your port does not need to stay accessed, no bandage is needed over the port.  WHAT IS FLUSHING? Flushing helps keep the port from getting clogged. Follow your health care provider's instructions on how and when to flush the port. Ports are usually flushed with saline solution or a medicine called heparin. The need for flushing will depend on how the port is used.   If the port is used for intermittent medicines or blood draws, the port will need to be flushed:   After medicines have been given.   After blood has been drawn.   As part of routine maintenance.   If a constant infusion is running, the port may not need to be flushed.  HOW LONG WILL MY PORT STAY IMPLANTED? The port can stay in for as long as your health care   provider thinks it is needed. When it is time for the port to come out, surgery will be done to remove it. The procedure is similar to the one performed when the port was put in.  WHEN SHOULD I SEEK IMMEDIATE MEDICAL CARE? When you have an implanted port, you should seek immediate medical care if:   You notice a bad smell coming from the incision site.   You have swelling, redness, or drainage at the incision site.   You have more swelling or pain at the port site or the surrounding area.   You have a fever that is not controlled with medicine.   This information is not intended to replace advice given to you by your health care provider. Make sure you discuss any questions you have with  your health care provider.   Document Released: 08/28/2005 Document Revised: 06/18/2013 Document Reviewed: 05/05/2013 Elsevier Interactive Patient Education 2016 [place] solution for injection What is this medicine? PEGLOTICASE (peg LOE ti kase) is an enzyme used to treat chronic gout. This medicine may be used for other purposes; ask your health care provider or pharmacist if you have questions. COMMON BRAND NAME(S): Krystexxa What should I tell my health care provider before I take this medicine? They need to know if you have any of these conditions: -G6PD deficiency -heart disease -an unusual or allergic reaction to pegloticase, other medicines, foods, dyes, or preservatives -pregnant or trying to get pregnant -breast-feeding How should I use this medicine? This medicine is for infusion into a vein. It is given by a health care professional in a hospital or clinic setting. A special MedGuide will be given to you before each treatment. Be sure to read this information carefully each time. Talk to your pediatrician regarding the use of this medicine in children. Special care may be needed. Overdosage: If you think you have taken too much of this medicine contact a poison control center or emergency room at once. NOTE: This medicine is only for you. Do not share this medicine with others. What if I miss a dose? It is important not to miss your dose. Call your doctor or health care professional if you are unable to keep an appointment. What may interact with this medicine? -certain medicines for gout like allopurinol, febuxostat, probenecid, sulfinpyrazone This list may not describe all possible interactions. Give your health care provider a list of all the medicines, herbs, non-prescription drugs, or dietary supplements you use. Also tell them if you smoke, drink alcohol, or use illegal drugs. Some items may interact with your medicine. What should I watch for while  using this medicine? This medicine can cause serious allergic reactions. To reduce your risk you may need to take medicine before treatment with this medicine. Take your medicine as directed. Your condition will be monitored carefully while you are receiving this medicine. Tell your doctor or healthcare professional if your symptoms do not start to get better or if they get worse. You will need simple blood tests. What side effects may I notice from receiving this medicine? Side effects that you should report to your doctor or health care professional as soon as possible: -allergic reactions like skin rash, itching or hives, swelling of the face, lips, or tongue -bluish coloring of the skin -breathing problems -chest pain or chest tightness -fast or irregular heartbeat -feeling faint or lightheaded, falls -pain when urinating -swelling of the ankles, feet, hands -symptoms of gout -unusual bleeding or bruising -unusually weak or tired  Side effects that usually do not require medical attention (report to your doctor or health care professional if they continue or are bothersome): -muscle pain -nausea, vomiting -upset stomach This list may not describe all possible side effects. Call your doctor for medical advice about side effects. You may report side effects to FDA at 1-800-FDA-1088. Where should I keep my medicine? This drug is given in a hospital or clinic and will not be stored at home. NOTE: This sheet is a summary. It may not cover all possible information. If you have questions about this medicine, talk to your doctor, pharmacist, or health care provider.  2018 Elsevier/Gold Standard (2015-09-30 11:17:08)  Hypokalemia Hypokalemia means that the amount of potassium in the blood is lower than normal.Potassium is a chemical that helps regulate the amount of fluid in the body (electrolyte). It also stimulates muscle tightening (contraction) and helps nerves work properly.Normally,  most of the body's potassium is inside of cells, and only a very small amount is in the blood. Because the amount in the blood is so small, minor changes to potassium levels in the blood can be life-threatening. What are the causes? This condition may be caused by:  Antibiotic medicine.  Diarrhea or vomiting. Taking too much of a medicine that helps you have a bowel movement (laxative) can cause diarrhea and lead to hypokalemia.  Chronic kidney disease (CKD).  Medicines that help the body get rid of excess fluid (diuretics).  Eating disorders, such as bulimia.  Low magnesium levels in the body.  Sweating a lot. What are the signs or symptoms? Symptoms of this condition include:  Weakness.  Constipation.  Fatigue.  Muscle cramps.  Mental confusion.  Skipped heartbeats or irregular heartbeat (palpitations).  Tingling or numbness. How is this diagnosed? This condition is diagnosed with a blood test. How is this treated? Hypokalemia can be treated by taking potassium supplements by mouth or adjusting the medicines that you take. Treatment may also include eating more foods that contain a lot of potassium. If your potassium level is very low, you may need to get potassium through an IV tube in one of your veins and be monitored in the hospital. Follow these instructions at home:  Take over-the-counter and prescription medicines only as told by your health care provider. This includes vitamins and supplements.  Eat a healthy diet. A healthy diet includes fresh fruits and vegetables, whole grains, healthy fats, and lean proteins.  If instructed, eat more foods that contain a lot of potassium, such as:  Nuts, such as peanuts and pistachios.  Seeds, such as sunflower seeds and pumpkin seeds.  Peas, lentils, and lima beans.  Whole grain and bran cereals and breads.  Fresh fruits and vegetables, such as apricots, avocado, bananas, cantaloupe, kiwi, oranges, tomatoes,  asparagus, and potatoes.  Orange juice.  Tomato juice.  Red meats.  Yogurt.  Keep all follow-up visits as told by your health care provider. This is important. Contact a health care provider if:  You have weakness that gets worse.  You feel your heart pounding or racing.  You vomit.  You have diarrhea.  You have diabetes (diabetes mellitus) and you have trouble keeping your blood sugar (glucose) in your target range. Get help right away if:  You have chest pain.  You have shortness of breath.  You have vomiting or diarrhea that lasts for more than 2 days.  You faint. This information is not intended to replace advice given to you by your health care  provider. Make sure you discuss any questions you have with your health care provider. Document Released: 08/28/2005 Document Revised: 04/15/2016 Document Reviewed: 04/15/2016 Elsevier Interactive Patient Education  2017 [employer].

## 2016-11-13 ENCOUNTER — Telehealth: Payer: Self-pay | Admitting: *Deleted

## 2016-11-13 NOTE — Telephone Encounter (Signed)
Patient called with complaints of excessive saliva over the weekend. She states she is unable to swallow all of the saliva that her mouth is producing due to quantity. She did not have this side effect from treatment last time. She reports the roof of her mouth is weird feeling like sand paper. There are no sores and no pain. She states it just feels "funny".    She has not been drinking water as directed. She reports she did not feel like it over the weekend. This morning she tried drinking more fluids and reports a decrease in saliva.   Patient advised to increase water intake and call this office to report any change in pain with swallowing or mouth sores that might develop. Martha Becker verbalized an understanding and stated would increase fluids and rest.

## 2016-11-15 ENCOUNTER — Ambulatory Visit (HOSPITAL_BASED_OUTPATIENT_CLINIC_OR_DEPARTMENT_OTHER): Payer: 59

## 2016-11-15 ENCOUNTER — Encounter: Payer: Self-pay | Admitting: Nurse Practitioner

## 2016-11-15 ENCOUNTER — Telehealth: Payer: Self-pay | Admitting: *Deleted

## 2016-11-15 ENCOUNTER — Ambulatory Visit (HOSPITAL_BASED_OUTPATIENT_CLINIC_OR_DEPARTMENT_OTHER): Payer: 59 | Admitting: Nurse Practitioner

## 2016-11-15 VITALS — BP 113/75 | HR 93 | Temp 98.3°F | Resp 18 | Ht 66.0 in | Wt 135.2 lb

## 2016-11-15 DIAGNOSIS — C787 Secondary malignant neoplasm of liver and intrahepatic bile duct: Secondary | ICD-10-CM | POA: Diagnosis not present

## 2016-11-15 DIAGNOSIS — C7800 Secondary malignant neoplasm of unspecified lung: Secondary | ICD-10-CM

## 2016-11-15 DIAGNOSIS — E860 Dehydration: Secondary | ICD-10-CM | POA: Diagnosis not present

## 2016-11-15 DIAGNOSIS — R5383 Other fatigue: Secondary | ICD-10-CM

## 2016-11-15 DIAGNOSIS — C182 Malignant neoplasm of ascending colon: Secondary | ICD-10-CM

## 2016-11-15 DIAGNOSIS — C180 Malignant neoplasm of cecum: Secondary | ICD-10-CM | POA: Diagnosis not present

## 2016-11-15 DIAGNOSIS — C786 Secondary malignant neoplasm of retroperitoneum and peritoneum: Secondary | ICD-10-CM

## 2016-11-15 DIAGNOSIS — E039 Hypothyroidism, unspecified: Secondary | ICD-10-CM

## 2016-11-15 LAB — CBC WITH DIFFERENTIAL/PLATELET
BASO%: 0.3 % (ref 0.0–2.0)
BASOS ABS: 0 10*3/uL (ref 0.0–0.1)
EOS ABS: 0.1 10*3/uL (ref 0.0–0.5)
EOS%: 0.4 % (ref 0.0–7.0)
HEMATOCRIT: 34.8 % (ref 34.8–46.6)
HEMOGLOBIN: 11.6 g/dL (ref 11.6–15.9)
LYMPH#: 1.1 10*3/uL (ref 0.9–3.3)
LYMPH%: 8.3 % — ABNORMAL LOW (ref 14.0–49.7)
MCH: 27.6 pg (ref 25.1–34.0)
MCHC: 33.3 g/dL (ref 31.5–36.0)
MCV: 82.7 fL (ref 79.5–101.0)
MONO#: 1.1 10*3/uL — ABNORMAL HIGH (ref 0.1–0.9)
MONO%: 8.1 % (ref 0.0–14.0)
NEUT%: 82.9 % — ABNORMAL HIGH (ref 38.4–76.8)
NEUTROS ABS: 10.9 10*3/uL — AB (ref 1.5–6.5)
Platelets: 199 10*3/uL (ref 145–400)
RBC: 4.21 10*6/uL (ref 3.70–5.45)
RDW: 17.3 % — AB (ref 11.2–14.5)
WBC: 13.1 10*3/uL — ABNORMAL HIGH (ref 3.9–10.3)

## 2016-11-15 LAB — COMPREHENSIVE METABOLIC PANEL
ALBUMIN: 3.5 g/dL (ref 3.5–5.0)
ALK PHOS: 264 U/L — AB (ref 40–150)
ALT: 26 U/L (ref 0–55)
AST: 39 U/L — AB (ref 5–34)
Anion Gap: 10 mEq/L (ref 3–11)
BILIRUBIN TOTAL: 0.85 mg/dL (ref 0.20–1.20)
BUN: 12.1 mg/dL (ref 7.0–26.0)
CO2: 24 mEq/L (ref 22–29)
CREATININE: 0.7 mg/dL (ref 0.6–1.1)
Calcium: 10 mg/dL (ref 8.4–10.4)
Chloride: 92 mEq/L — ABNORMAL LOW (ref 98–109)
EGFR: >90 mL/min/{1.73_m2} (ref >90)
GLUCOSE: 84 mg/dL (ref 70–140)
POTASSIUM: 4.5 meq/L (ref 3.5–5.1)
SODIUM: 127 meq/L — AB (ref 136–145)
TOTAL PROTEIN: 7.1 g/dL (ref 6.4–8.3)

## 2016-11-15 MED ORDER — SODIUM CHLORIDE 0.9 % IV SOLN
INTRAVENOUS | Status: DC
  Administered 2016-11-15: 15:00:00 via INTRAVENOUS

## 2016-11-15 MED ORDER — HEPARIN SOD (PORK) LOCK FLUSH 100 UNIT/ML IV SOLN
500.0000 [IU] | Freq: Once | INTRAVENOUS | Status: AC
  Administered 2016-11-15: 500 [IU] via INTRAVENOUS
  Filled 2016-11-15: qty 5

## 2016-11-15 MED ORDER — SODIUM CHLORIDE 0.9% FLUSH
10.0000 mL | Freq: Once | INTRAVENOUS | Status: AC
  Administered 2016-11-15: 10 mL via INTRAVENOUS
  Filled 2016-11-15: qty 10

## 2016-11-15 NOTE — Assessment & Plan Note (Signed)
Patient received her last cycle of Folfiri/Avastin chemotherapy on 11/08/2016.  She has been ordered a restaging CT with contrast of the chest/abdomen/pelvis-but has not been scheduled as of yet.  Have requested that the restaging scans be scheduled for next week; and patient will need to return for labs, visit, and her next cycle of chemotherapy on 11/29/2016.

## 2016-11-15 NOTE — Telephone Encounter (Signed)
"  I feel sick.  Dr.Sherrill told me to call to come in if I feel tired and need to be seen.  I can arrange to come in after lunch today.  I received chemo 11-08-2016 and should feel better by now.  I cough and clear liquid comes up that tastes bitter and sour.  I'm not nauseated.  Stuff just comes up with this cough.  Yesterday I threw up twice, clear liquid and some salad remnants,  The top of my throat feels raw.  Lots of saliva.  I place a towel by my mouth at night it just drools out.  I have no energy and have not recooped.  I do not have a fever and the batteries in the thermometer are out."

## 2016-11-15 NOTE — Assessment & Plan Note (Signed)
Patient presented to the [facility] today with complaint of mild dehydration.  She states that she tries to both eat and drink; but eating.  Always causes increasing fatigue and she has to take a nap after each meal.  She denies any nausea, vomiting, or diarrhea.  It is noted that patient has had a chronic low sodium recently; with today's sodium at 127.  Patient will receive IV fluid rehydration today.  She was also encouraged to push fluids at home is much as possible.  She was given a snack while at the [facility] today as well.  She was advised to return to the [facility].  She continues to feel dehydrated and needs additional IV fluid rehydration.

## 2016-11-15 NOTE — Progress Notes (Signed)
SYMPTOM MANAGEMENT CLINIC    Chief Complaint: Fatigue, dehydration  HPI:  Martha Becker 61 y.o. female diagnosed with colon cancer with lung metastasis.  Currently undergoing Folfiri/Avastin chemotherapy regimen.    No history exists.    Review of Systems  Constitutional: Positive for malaise/fatigue.  All other systems reviewed and are negative.   Past Medical History:  Diagnosis Date  . Colon cancer ([place]) 08/2014   with mets to liver  . Hypothyroidism     Past Surgical History:  Procedure Laterality Date  . CESAREAN SECTION    . LIVER BIOPSY N/A 08/31/2014   Procedure: LIVER BIOPSY;  Surgeon: Jamesetta So, MD;  Location: [facility];  Service: General;  Laterality: N/A;  . PARTIAL COLECTOMY N/A 08/31/2014   Procedure: Right Colon Resection ;  Surgeon: Jamesetta So, MD;  Location: [facility];  Service: General;  Laterality: N/A;  . 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    is allergic to codeine.  Allergies as of 11/15/2016      Reactions   Codeine Nausea And Vomiting      Medication List       Accurate as of 11/15/16  4:52 PM. Always use your most recent med list.          diphenoxylate-atropine 2.5-0.025 MG tablet Commonly known as:  LOMOTIL Take 1 tablet by mouth 4 (four) times daily as needed for diarrhea or loose stools.   levothyroxine 50 MCG tablet Commonly known as:  SYNTHROID Take 1  tablet (50 mcg total) by mouth daily before breakfast.   loperamide 2 MG capsule Commonly known as:  IMODIUM Take 2-4 mg by mouth as needed for diarrhea or loose stools.   ondansetron 4 MG tablet Commonly known as:  ZOFRAN Take 1 tablet (4 mg total) by mouth every 6 (six) hours as needed for nausea or vomiting.   prochlorperazine 10 MG tablet Commonly known as:  COMPAZINE Take 1 tablet (10 mg total) by mouth every 6 (six) hours as needed for nausea or vomiting.   senna 8.6 MG tablet Commonly known as:  SENOKOT Take 2 tablets by mouth daily.   traMADol 50 MG tablet Commonly known as:  ULTRAM Take 0.5-1 tablets (25-50 mg total) by mouth every 6 (six) hours as needed.        PHYSICAL EXAMINATION  Oncology Vitals 11/15/2016 11/10/2016  Height 168 cm -  Weight 61.326 kg -  Weight (lbs) 135 lbs 3 oz -  BMI (kg/m2) 21.82 kg/m2 -  Temp 98.3 99.2  Pulse 93 80  Resp 18 18  SpO2 99 99  BSA (m2) 1.69 m2 -   BP Readings from Last 2 Encounters:  11/15/16 113/75  11/10/16 (!) 142/90    Physical Exam  Constitutional: She is oriented to person, place, and time and well-developed, well-nourished, and in no distress.  HENT:  Head: Normocephalic and atraumatic.  Eyes: Conjunctivae and EOM are normal. Pupils are equal, round, and reactive to light.  Neck: Normal range of motion.  Pulmonary/Chest: Effort  normal. No respiratory distress.  Musculoskeletal: Normal range of motion.  Neurological: She is alert and oriented to person, place, and time. Gait normal.  Skin: There is pallor.  Psychiatric: Affect normal.  Nursing note and vitals reviewed.   LABORATORY DATA:. Appointment on 11/15/2016  Component Date Value Ref Range Status  . WBC 11/15/2016 13.1* 3.9 - 10.3 10e3/uL Final  . NEUT# 11/15/2016 10.9* 1.5 - 6.5 10e3/uL Final  . HGB 11/15/2016 11.6  11.6 - 15.9 g/dL Final  . HCT 11/15/2016 34.8  34.8 - 46.6 % Final  . Platelets 11/15/2016 199  145 - 400 10e3/uL Final  . MCV  11/15/2016 82.7  79.5 - 101.0 fL Final  . MCH 11/15/2016 27.6  25.1 - 34.0 pg Final  . MCHC 11/15/2016 33.3  31.5 - 36.0 g/dL Final  . RBC 11/15/2016 4.21  3.70 - 5.45 10e6/uL Final  . RDW 11/15/2016 17.3* 11.2 - 14.5 % Final  . lymph# 11/15/2016 1.1  0.9 - 3.3 10e3/uL Final  . MONO# 11/15/2016 1.1* 0.1 - 0.9 10e3/uL Final  . Eosinophils Absolute 11/15/2016 0.1  0.0 - 0.5 10e3/uL Final  . Basophils Absolute 11/15/2016 0.0  0.0 - 0.1 10e3/uL Final  . NEUT% 11/15/2016 82.9* 38.4 - 76.8 % Final  . LYMPH% 11/15/2016 8.3* 14.0 - 49.7 % Final  . MONO% 11/15/2016 8.1  0.0 - 14.0 % Final  . EOS% 11/15/2016 0.4  0.0 - 7.0 % Final  . BASO% 11/15/2016 0.3  0.0 - 2.0 % Final  . Sodium 11/15/2016 127* 136 - 145 mEq/L Final  . Potassium 11/15/2016 4.5  3.5 - 5.1 mEq/L Final  . Chloride 11/15/2016 92* 98 - 109 mEq/L Final  . CO2 11/15/2016 24  22 - 29 mEq/L Final  . Glucose 11/15/2016 84  70 - 140 mg/dl Final  . BUN 11/15/2016 12.1  7.0 - 26.0 mg/dL Final  . Creatinine 11/15/2016 0.7  0.6 - 1.1 mg/dL Final  . Total Bilirubin 11/15/2016 0.85  0.20 - 1.20 mg/dL Final  . Alkaline Phosphatase 11/15/2016 264* 40 - 150 U/L Final  . AST 11/15/2016 39* 5 - 34 U/L Final  . ALT 11/15/2016 26  0 - 55 U/L Final  . Total Protein 11/15/2016 7.1  6.4 - 8.3 g/dL Final  . Albumin 11/15/2016 3.5  3.5 - 5.0 g/dL Final  . Calcium 11/15/2016 10.0  8.4 - 10.4 mg/dL Final  . Anion Gap 11/15/2016 10  3 - 11 mEq/L Final  . EGFR 11/15/2016 >90  >90 ml/min/1.73 m2 Final    RADIOGRAPHIC STUDIES: No results found.  ASSESSMENT/PLAN:    Hypothyroidism Patient has a history of hypothyroidism.  Patient had been taking Synthroid as previously directed.  She states that she became frustrated recently; and completely discontinued all of her thyroid medications.  She presented to the [facility] today with complaint of increased fatigue and weakness.  She also feels very deconditioned as well.  Most recent TSH obtained on  10/25/2016 was elevated to 11.25.  Patient states that she has started to take her Synthroid tablets again within the last 5-6 days.  Long discussion with both patient and her family member regarding increased fatigue that could be secondary to her disease itself; dehydration and hyponatremia, and as a result of her untreated hypothyroidism.  Patient will receive IV fluid rehydration today.  She was also encouraged to continue to take her thyroid medication as previously directed.  Also, will add a TSH to the labs that will be drawn  when she returns to the cancer center in 2 weeks for her next cycle of chemotherapy.  Dehydration Patient presented to the [facility] today with complaint of mild dehydration.  She states that she tries to both eat and drink; but eating.  Always causes increasing fatigue and she has to take a nap after each meal.  She denies any nausea, vomiting, or diarrhea.  It is noted that patient has had a chronic low sodium recently; with today's sodium at 127.  Patient will receive IV fluid rehydration today.  She was also encouraged to push fluids at home is much as possible.  She was given a snack while at the [facility] today as well.  She was advised to return to the [place] [facility].  She continues to feel dehydrated and needs additional IV fluid rehydration.  Cecal cancer [facility]) Patient received her last cycle of Folfiri/Avastin chemotherapy on 11/08/2016.  She has been ordered a restaging CT with contrast of the chest/abdomen/pelvis-but has not been scheduled as of yet.  Have requested that the restaging scans be scheduled for next week; and patient will need to return for labs, visit, and her next cycle of chemotherapy on 11/29/2016.   Patient stated understanding of all instructions; and was in agreement with this plan of care. The patient knows to call the clinic with any problems, questions or concerns.   Total time spent with patient was 25 minutes;  with greater  than 75 percent of that time spent in face to face counseling regarding patient's symptoms,  and coordination of care and follow up.  Disclaimer:This dictation was prepared with Dragon/digital dictation along with [employer]. Any transcriptional errors that result from this process are unintentional.  Drue Second, NP 11/15/2016

## 2016-11-15 NOTE — Assessment & Plan Note (Signed)
Patient has a history of hypothyroidism.  Patient had been taking Synthroid as previously directed.  She states that she became frustrated recently; and completely discontinued all of her thyroid medications.  She presented to the [name] today with complaint of increased fatigue and weakness.  She also feels very deconditioned as well.  Most recent TSH obtained on 10/25/2016 was elevated to 11.25.  Patient states that she has started to take her Synthroid tablets again within the last 5-6 days.  Long discussion with both patient and her family member regarding increased fatigue that could be secondary to her disease itself; dehydration and hyponatremia, and as a result of her untreated hypothyroidism.  Patient will receive IV fluid rehydration today.  She was also encouraged to continue to take her thyroid medication as previously directed.  Also, will add a TSH to the labs that will be drawn when she returns to the cancer center in 2 weeks for her next cycle of chemotherapy.

## 2016-11-15 NOTE — Patient Instructions (Signed)
Dehydration, Adult Dehydration is a condition in which there is not enough fluid or water in the body. This happens when you lose more fluids than you take in. Important organs, such as the kidneys, brain, and heart, cannot function without a proper amount of fluids. Any loss of fluids from the body can lead to dehydration. Dehydration can range from mild to severe. This condition should be treated right away to prevent it from becoming severe. What are the causes? This condition may be caused by:  Vomiting.  Diarrhea.  Excessive sweating, such as from heat exposure or exercise.  Not drinking enough fluid, especially:  When ill.  While doing activity that requires a lot of energy.  Excessive urination.  Fever.  Infection.  Certain medicines, such as medicines that cause the body to lose excess fluid (diuretics).  Inability to access safe drinking water.  Reduced physical ability to get adequate water and food. What increases the risk? This condition is more likely to develop in people:  Who have a poorly controlled long-term (chronic) illness, such as diabetes, heart disease, or kidney disease.  Who are age 65 or older.  Who are disabled.  Who live in a place with high altitude.  Who play endurance sports. What are the signs or symptoms? Symptoms of mild dehydration may include:   Thirst.  Dry lips.  Slightly dry mouth.  Dry, warm skin.  Dizziness. Symptoms of moderate dehydration may include:   Very dry mouth.  Muscle cramps.  Dark urine. Urine may be the color of tea.  Decreased urine production.  Decreased tear production.  Heartbeat that is irregular or faster than normal (palpitations).  Headache.  Light-headedness, especially when you stand up from a sitting position.  Fainting (syncope). Symptoms of severe dehydration may include:   Changes in skin, such as:  Cold and clammy skin.  Blotchy (mottled) or pale skin.  Skin that does  not quickly return to normal after being lightly pinched and released (poor skin turgor).  Changes in body fluids, such as:  Extreme thirst.  No tear production.  Inability to sweat when body temperature is high, such as in hot weather.  Very little urine production.  Changes in vital signs, such as:  Weak pulse.  Pulse that is more than 100 beats a minute when sitting still.  Rapid breathing.  Low blood pressure.  Other changes, such as:  Sunken eyes.  Cold hands and feet.  Confusion.  Lack of energy (lethargy).  Difficulty waking up from sleep.  Short-term weight loss.  Unconsciousness. How is this diagnosed? This condition is diagnosed based on your symptoms and a physical exam. Blood and urine tests may be done to help confirm the diagnosis. How is this treated? Treatment for this condition depends on the severity. Mild or moderate dehydration can often be treated at home. Treatment should be started right away. Do not wait until dehydration becomes severe. Severe dehydration is an emergency and it needs to be treated in a hospital. Treatment for mild dehydration may include:   Drinking more fluids.  Replacing salts and minerals in your blood (electrolytes) that you may have lost. Treatment for moderate dehydration may include:   Drinking an oral rehydration solution (ORS). This is a drink that helps you replace fluids and electrolytes (rehydrate). It can be found at pharmacies and retail stores. Treatment for severe dehydration may include:   Receiving fluids through an IV tube.  Receiving an electrolyte solution through a feeding tube that is   passed through your nose and into your stomach (nasogastric tube, or NG tube).  Correcting any abnormalities in electrolytes.  Treating the underlying cause of dehydration. Follow these instructions at home:  If directed by your health care provider, drink an ORS:  Make an ORS by following instructions on the  package.  Start by drinking small amounts, about  cup (120 mL) every 5-10 minutes.  Slowly increase how much you drink until you have taken the amount recommended by your health care provider.  Drink enough clear fluid to keep your urine clear or pale yellow. If you were told to drink an ORS, finish the ORS first, then start slowly drinking other clear fluids. Drink fluids such as:  Water. Do not drink only water. Doing that can lead to having too little salt (sodium) in the body (hyponatremia).  Ice chips.  Fruit juice that you have added water to (diluted fruit juice).  Low-calorie sports drinks.  Avoid:  Alcohol.  Drinks that contain a lot of sugar. These include high-calorie sports drinks, fruit juice that is not diluted, and soda.  Caffeine.  Foods that are greasy or contain a lot of fat or sugar.  Take over-the-counter and prescription medicines only as told by your health care provider.  Do not take sodium tablets. This can lead to having too much sodium in the body (hypernatremia).  Eat foods that contain a healthy balance of electrolytes, such as bananas, oranges, potatoes, tomatoes, and spinach.  Keep all follow-up visits as told by your health care provider. This is important. Contact a health care provider if:  You have abdominal pain that:  Gets worse.  Stays in one area (localizes).  You have a rash.  You have a stiff neck.  You are more irritable than usual.  You are sleepier or more difficult to wake up than usual.  You feel weak or dizzy.  You feel very thirsty.  You have urinated only a small amount of very dark urine over 6-8 hours. Get help right away if:  You have symptoms of severe dehydration.  You cannot drink fluids without vomiting.  Your symptoms get worse with treatment.  You have a fever.  You have a severe headache.  You have vomiting or diarrhea that:  Gets worse.  Does not go away.  You have blood or green matter  (bile) in your vomit.  You have blood in your stool. This may cause stool to look black and tarry.  You have not urinated in 6-8 hours.  You faint.  Your heart rate while sitting still is over 100 beats a minute.  You have trouble breathing. This information is not intended to replace advice given to you by your health care provider. Make sure you discuss any questions you have with your health care provider. Document Released: 08/28/2005 Document Revised: 03/24/2016 Document Reviewed: 10/22/2015 Elsevier Interactive Patient Education  2017 [employer]  

## 2016-11-15 NOTE — Telephone Encounter (Signed)
Spoke with patient about scheduling an appointment per Benay Spice, MD for labs at 1330 pm, and to see Selena Lesser, NP at 1400 pm. Patient verbalized understanding. High priority message sent to scheduling for an appointment. Lab orders placed per Sherrill,MD.

## 2016-11-16 ENCOUNTER — Telehealth: Payer: Self-pay | Admitting: Oncology

## 2016-11-16 NOTE — Telephone Encounter (Signed)
lvm to inform pt of 3/21 appt date/time per LOS. sw radiology scheduler to contact pt to sch CT scans per LOS before next follow up appt

## 2016-11-23 ENCOUNTER — Ambulatory Visit (HOSPITAL_COMMUNITY)
Admission: RE | Admit: 2016-11-23 | Discharge: 2016-11-23 | Disposition: A | Discharge status: Home / Self Care | Payer: 59 | Source: Ambulatory Visit | Attending: Nurse Practitioner | Admitting: Nurse Practitioner

## 2016-11-23 DIAGNOSIS — C7800 Secondary malignant neoplasm of unspecified lung: Secondary | ICD-10-CM | POA: Insufficient documentation

## 2016-11-23 DIAGNOSIS — C189 Malignant neoplasm of colon, unspecified: Secondary | ICD-10-CM | POA: Insufficient documentation

## 2016-11-23 DIAGNOSIS — C787 Secondary malignant neoplasm of liver and intrahepatic bile duct: Secondary | ICD-10-CM | POA: Insufficient documentation

## 2016-11-23 DIAGNOSIS — S2231XD Fracture of one rib, right side, subsequent encounter for fracture with routine healing: Secondary | ICD-10-CM | POA: Insufficient documentation

## 2016-11-23 DIAGNOSIS — X58XXXD Exposure to other specified factors, subsequent encounter: Secondary | ICD-10-CM | POA: Diagnosis not present

## 2016-11-23 DIAGNOSIS — C772 Secondary and unspecified malignant neoplasm of intra-abdominal lymph nodes: Secondary | ICD-10-CM | POA: Insufficient documentation

## 2016-11-23 DIAGNOSIS — C7951 Secondary malignant neoplasm of bone: Secondary | ICD-10-CM | POA: Diagnosis not present

## 2016-11-23 MED ORDER — IOPAMIDOL (ISOVUE-300) INJECTION 61%
INTRAVENOUS | Status: AC
  Administered 2016-11-23: 30 mL via ORAL
  Filled 2016-11-23: qty 30

## 2016-11-23 MED ORDER — IOPAMIDOL (ISOVUE-300) INJECTION 61%
100.0000 mL | Freq: Once | INTRAVENOUS | Status: AC | PRN
  Administered 2016-11-23: 100 mL via INTRAVENOUS

## 2016-11-28 ENCOUNTER — Telehealth: Payer: Self-pay | Admitting: *Deleted

## 2016-11-28 NOTE — Telephone Encounter (Signed)
Call placed back to patient to inform her per Dr. Benay Spice, that he has not had an opportunity to look at her CT results yet, but will do so today and release the results in to MyChart by the end of the day.  Patient informed that I would send a message to scheduling to add 2 hrs of IVF's to pump d/c on 12/01/16.  Patient appreciative of call back and has no further questions at this time.

## 2016-11-28 NOTE — Telephone Encounter (Signed)
"  I need to get a message to Dr. Gearldine Shown nurse.   1. I have been checking MyChart for the scan results and they've not been released.  Is there something they're not trying to tell me.  I know I see him tomorrow but I'd like to know.   2. I have chemotherapy scheduled tomorrow but I do not have IVF scheduled for the day the pump is disconnected.  Was this an oversight?"  Return number [phone]."  a

## 2016-11-29 ENCOUNTER — Other Ambulatory Visit (HOSPITAL_BASED_OUTPATIENT_CLINIC_OR_DEPARTMENT_OTHER): Payer: 59

## 2016-11-29 ENCOUNTER — Ambulatory Visit (HOSPITAL_BASED_OUTPATIENT_CLINIC_OR_DEPARTMENT_OTHER): Payer: 59 | Admitting: Oncology

## 2016-11-29 ENCOUNTER — Ambulatory Visit: Payer: 59

## 2016-11-29 ENCOUNTER — Ambulatory Visit (HOSPITAL_BASED_OUTPATIENT_CLINIC_OR_DEPARTMENT_OTHER): Payer: 59

## 2016-11-29 VITALS — BP 97/83 | HR 90 | Temp 98.4°F | Resp 18 | Ht 66.0 in | Wt 132.3 lb

## 2016-11-29 DIAGNOSIS — C180 Malignant neoplasm of cecum: Secondary | ICD-10-CM

## 2016-11-29 DIAGNOSIS — E039 Hypothyroidism, unspecified: Secondary | ICD-10-CM | POA: Diagnosis not present

## 2016-11-29 DIAGNOSIS — C7801 Secondary malignant neoplasm of right lung: Secondary | ICD-10-CM | POA: Diagnosis not present

## 2016-11-29 DIAGNOSIS — C787 Secondary malignant neoplasm of liver and intrahepatic bile duct: Secondary | ICD-10-CM | POA: Diagnosis not present

## 2016-11-29 DIAGNOSIS — C7802 Secondary malignant neoplasm of left lung: Secondary | ICD-10-CM | POA: Diagnosis not present

## 2016-11-29 DIAGNOSIS — Z5111 Encounter for antineoplastic chemotherapy: Secondary | ICD-10-CM | POA: Diagnosis not present

## 2016-11-29 DIAGNOSIS — Z5112 Encounter for antineoplastic immunotherapy: Secondary | ICD-10-CM | POA: Diagnosis not present

## 2016-11-29 DIAGNOSIS — E860 Dehydration: Secondary | ICD-10-CM

## 2016-11-29 DIAGNOSIS — C189 Malignant neoplasm of colon, unspecified: Secondary | ICD-10-CM

## 2016-11-29 DIAGNOSIS — Z95828 Presence of other vascular implants and grafts: Secondary | ICD-10-CM

## 2016-11-29 LAB — CBC WITH DIFFERENTIAL/PLATELET
BASO%: 0.9 % (ref 0.0–2.0)
BASOS ABS: 0.1 10*3/uL (ref 0.0–0.1)
EOS%: 0.7 % (ref 0.0–7.0)
Eosinophils Absolute: 0.1 10*3/uL (ref 0.0–0.5)
HCT: 34.2 % — ABNORMAL LOW (ref 34.8–46.6)
HGB: 11.4 g/dL — ABNORMAL LOW (ref 11.6–15.9)
LYMPH%: 5.6 % — ABNORMAL LOW (ref 14.0–49.7)
MCH: 27.6 pg (ref 25.1–34.0)
MCHC: 33.4 g/dL (ref 31.5–36.0)
MCV: 82.7 fL (ref 79.5–101.0)
MONO#: 1.4 10*3/uL — ABNORMAL HIGH (ref 0.1–0.9)
MONO%: 8.4 % (ref 0.0–14.0)
NEUT#: 13.9 10*3/uL — ABNORMAL HIGH (ref 1.5–6.5)
NEUT%: 84.4 % — ABNORMAL HIGH (ref 38.4–76.8)
Platelets: 256 10*3/uL (ref 145–400)
RBC: 4.14 10*6/uL (ref 3.70–5.45)
RDW: 19.2 % — AB (ref 11.2–14.5)
WBC: 16.4 10*3/uL — ABNORMAL HIGH (ref 3.9–10.3)
lymph#: 0.9 10*3/uL (ref 0.9–3.3)

## 2016-11-29 LAB — COMPREHENSIVE METABOLIC PANEL
ALT: 14 U/L (ref 0–55)
AST: 29 U/L (ref 5–34)
Albumin: 3.3 g/dL — ABNORMAL LOW (ref 3.5–5.0)
Alkaline Phosphatase: 244 U/L — ABNORMAL HIGH (ref 40–150)
Anion Gap: 11 mEq/L (ref 3–11)
BUN: 11.1 mg/dL (ref 7.0–26.0)
CHLORIDE: 94 meq/L — AB (ref 98–109)
CO2: 23 meq/L (ref 22–29)
Calcium: 10.2 mg/dL (ref 8.4–10.4)
Creatinine: 0.7 mg/dL (ref 0.6–1.1)
EGFR: >90 mL/min/{1.73_m2} (ref >90)
Glucose: 91 mg/dl (ref 70–140)
POTASSIUM: 4.3 meq/L (ref 3.5–5.1)
SODIUM: 128 meq/L — AB (ref 136–145)
Total Bilirubin: 0.61 mg/dL (ref 0.20–1.20)
Total Protein: 7 g/dL (ref 6.4–8.3)

## 2016-11-29 LAB — CEA (IN HOUSE-CHCC): CEA (CHCC-IN HOUSE): 109.41 ng/mL — AB (ref 0.00–5.00)

## 2016-11-29 LAB — TSH: TSH: 6.711 m(IU)/L — ABNORMAL HIGH (ref 0.308–3.960)

## 2016-11-29 LAB — UA PROTEIN, DIPSTICK - CHCC: Protein, ur: NEGATIVE mg/dL

## 2016-11-29 MED ORDER — PALONOSETRON HCL INJECTION 0.25 MG/5ML
INTRAVENOUS | Status: AC
  Filled 2016-11-29: qty 5

## 2016-11-29 MED ORDER — SODIUM CHLORIDE 0.9 % IJ SOLN
10.0000 mL | INTRAMUSCULAR | Status: AC | PRN
  Administered 2016-11-29: 10 mL
  Filled 2016-11-29: qty 10

## 2016-11-29 MED ORDER — FAMOTIDINE IN NACL 20-0.9 MG/50ML-% IV SOLN
20.0000 mg | Freq: Two times a day (BID) | INTRAVENOUS | Status: DC
Start: 2016-11-29 — End: 2016-11-29
  Administered 2016-11-29: 20 mg via INTRAVENOUS

## 2016-11-29 MED ORDER — SODIUM CHLORIDE 0.9 % IV SOLN: Freq: Once | INTRAVENOUS | Status: DC

## 2016-11-29 MED ORDER — SODIUM CHLORIDE 0.9 % IV SOLN
INTRAVENOUS | Status: DC
  Administered 2016-11-29: 12:00:00 via INTRAVENOUS

## 2016-11-29 MED ORDER — ATROPINE SULFATE 1 MG/ML IJ SOLN
0.5000 mg | Freq: Once | INTRAMUSCULAR | Status: AC | PRN
  Administered 2016-11-29: 0.5 mg via INTRAVENOUS

## 2016-11-29 MED ORDER — PALONOSETRON HCL INJECTION 0.25 MG/5ML
0.2500 mg | Freq: Once | INTRAVENOUS | Status: AC
  Administered 2016-11-29: 0.25 mg via INTRAVENOUS

## 2016-11-29 MED ORDER — HEPARIN SOD (PORK) LOCK FLUSH 100 UNIT/ML IV SOLN
500.0000 [IU] | Freq: Once | INTRAVENOUS | Status: DC | PRN
  Filled 2016-11-29: qty 5

## 2016-11-29 MED ORDER — SODIUM CHLORIDE 0.9 % IV SOLN
Freq: Once | INTRAVENOUS | Status: AC
  Administered 2016-11-29: 13:00:00 via INTRAVENOUS
  Filled 2016-11-29: qty 5

## 2016-11-29 MED ORDER — DIPHENHYDRAMINE HCL 50 MG/ML IJ SOLN
25.0000 mg | Freq: Once | INTRAMUSCULAR | Status: AC
  Administered 2016-11-29: 25 mg via INTRAVENOUS

## 2016-11-29 MED ORDER — LEVOTHYROXINE SODIUM 75 MCG PO TABS: 75.0000 ug | ORAL_TABLET | Freq: Every day | ORAL | 0 refills | Status: DC

## 2016-11-29 MED ORDER — FAMOTIDINE IN NACL 20-0.9 MG/50ML-% IV SOLN
INTRAVENOUS | Status: AC
  Filled 2016-11-29: qty 50

## 2016-11-29 MED ORDER — SODIUM CHLORIDE 0.9% FLUSH
10.0000 mL | INTRAVENOUS | Status: DC | PRN
  Filled 2016-11-29: qty 10

## 2016-11-29 MED ORDER — SODIUM CHLORIDE 0.9 % IV SOLN
1800.0000 mg/m2 | INTRAVENOUS | Status: DC
  Administered 2016-11-29: 3150 mg via INTRAVENOUS
  Filled 2016-11-29: qty 63

## 2016-11-29 MED ORDER — DIPHENHYDRAMINE HCL 50 MG/ML IJ SOLN
INTRAMUSCULAR | Status: AC
  Filled 2016-11-29: qty 1

## 2016-11-29 MED ORDER — ATROPINE SULFATE 1 MG/ML IJ SOLN
INTRAMUSCULAR | Status: AC
  Filled 2016-11-29: qty 1

## 2016-11-29 MED ORDER — FLUOROURACIL CHEMO INJECTION 2.5 GM/50ML
300.0000 mg/m2 | Freq: Once | INTRAVENOUS | Status: AC
Start: 2016-11-29 — End: 2016-11-29
  Administered 2016-11-29: 550 mg via INTRAVENOUS
  Filled 2016-11-29: qty 11

## 2016-11-29 MED ORDER — IRINOTECAN HCL CHEMO INJECTION 100 MG/5ML
170.0000 mg/m2 | Freq: Once | INTRAVENOUS | Status: AC
  Administered 2016-11-29: 300 mg via INTRAVENOUS
  Filled 2016-11-29: qty 15

## 2016-11-29 MED ORDER — SODIUM CHLORIDE 0.9 % IV SOLN
5.0000 mg/kg | Freq: Once | INTRAVENOUS | Status: AC
  Administered 2016-11-29: 325 mg via INTRAVENOUS
  Filled 2016-11-29: qty 13

## 2016-11-29 MED ORDER — LEUCOVORIN CALCIUM INJECTION 350 MG
300.0000 mg/m2 | Freq: Once | INTRAVENOUS | Status: AC
  Administered 2016-11-29: 526 mg via INTRAVENOUS
  Filled 2016-11-29: qty 26.3

## 2016-11-29 NOTE — Patient Instructions (Signed)
[facility] Discharge Instructions for Patients Receiving Chemotherapy  Today you received the following chemotherapy agents: Irinotecan, Leucovorin, and Adrucil.  To help prevent nausea and vomiting after your treatment, we encourage you to take your nausea medication as prescribed.   If you develop nausea and vomiting that is not controlled by your nausea medication, call the clinic.   BELOW ARE SYMPTOMS THAT SHOULD BE REPORTED IMMEDIATELY:  *FEVER GREATER THAN 100.5 F  *CHILLS WITH OR WITHOUT FEVER  NAUSEA AND VOMITING THAT IS NOT CONTROLLED WITH YOUR NAUSEA MEDICATION  *UNUSUAL SHORTNESS OF BREATH  *UNUSUAL BRUISING OR BLEEDING  TENDERNESS IN MOUTH AND THROAT WITH OR WITHOUT PRESENCE OF ULCERS  *URINARY PROBLEMS  *BOWEL PROBLEMS  UNUSUAL RASH Items with * indicate a potential emergency and should be followed up as soon as possible.  Feel free to call the clinic you have any questions or concerns. The clinic phone number is [phone].  Please show the [facility] at check-in to the Emergency Department and triage nurse.    Dehydration, Adult Dehydration is a condition in which there is not enough fluid or water in the body. This happens when you lose more fluids than you take in. Important organs, such as the kidneys, brain, and heart, cannot function without a proper amount of fluids. Any loss of fluids from the body can lead to dehydration. Dehydration can range from mild to severe. This condition should be treated right away to prevent it from becoming severe. What are the causes? This condition may be caused by:  Vomiting.  Diarrhea.  Excessive sweating, such as from heat exposure or exercise.  Not drinking enough fluid, especially:  When ill.  While doing activity that requires a lot of energy.  Excessive urination.  Fever.  Infection.  Certain medicines, such as medicines that cause the body to lose excess fluid  (diuretics).  Inability to access safe drinking water.  Reduced physical ability to get adequate water and food. What increases the risk? This condition is more likely to develop in people:  Who have a poorly controlled long-term (chronic) illness, such as diabetes, heart disease, or kidney disease.  Who are age 59 or older.  Who are disabled.  Who live in a place with high altitude.  Who play endurance sports. What are the signs or symptoms? Symptoms of mild dehydration may include:   Thirst.  Dry lips.  Slightly dry mouth.  Dry, warm skin.  Dizziness. Symptoms of moderate dehydration may include:   Very dry mouth.  Muscle cramps.  Dark urine. Urine may be the color of tea.  Decreased urine production.  Decreased tear production.  Heartbeat that is irregular or faster than normal (palpitations).  Headache.  Light-headedness, especially when you stand up from a sitting position.  Fainting (syncope). Symptoms of severe dehydration may include:   Changes in skin, such as:  Cold and clammy skin.  Blotchy (mottled) or pale skin.  Skin that does not quickly return to normal after being lightly pinched and released (poor skin turgor).  Changes in body fluids, such as:  Extreme thirst.  No tear production.  Inability to sweat when body temperature is high, such as in hot weather.  Very little urine production.  Changes in vital signs, such as:  Weak pulse.  Pulse that is more than 100 beats a minute when sitting still.  Rapid breathing.  Low blood pressure.  Other changes, such as:  Sunken eyes.  Cold hands and feet.  Confusion.  Lack of energy (lethargy).  Difficulty waking up from sleep.  Short-term weight loss.  Unconsciousness. How is this diagnosed? This condition is diagnosed based on your symptoms and a physical exam. Blood and urine tests may be done to help confirm the diagnosis. How is this treated? Treatment for this  condition depends on the severity. Mild or moderate dehydration can often be treated at home. Treatment should be started right away. Do not wait until dehydration becomes severe. Severe dehydration is an emergency and it needs to be treated in a hospital. Treatment for mild dehydration may include:   Drinking more fluids.  Replacing salts and minerals in your blood (electrolytes) that you may have lost. Treatment for moderate dehydration may include:   Drinking an oral rehydration solution (ORS). This is a drink that helps you replace fluids and electrolytes (rehydrate). It can be found at pharmacies and retail stores. Treatment for severe dehydration may include:   Receiving fluids through an IV tube.  Receiving an electrolyte solution through a feeding tube that is passed through your nose and into your stomach (nasogastric tube, or NG tube).  Correcting any abnormalities in electrolytes.  Treating the underlying cause of dehydration. Follow these instructions at home:  If directed by your health care provider, drink an ORS:  Make an ORS by following instructions on the package.  Start by drinking small amounts, about  cup (120 mL) every 5-10 minutes.  Slowly increase how much you drink until you have taken the amount recommended by your health care provider.  Drink enough clear fluid to keep your urine clear or pale yellow. If you were told to drink an ORS, finish the ORS first, then start slowly drinking other clear fluids. Drink fluids such as:  Water. Do not drink only water. Doing that can lead to having too little salt (sodium) in the body (hyponatremia).  Ice chips.  Fruit juice that you have added water to (diluted fruit juice).  Low-calorie sports drinks.  Avoid:  Alcohol.  Drinks that contain a lot of sugar. These include high-calorie sports drinks, fruit juice that is not diluted, and soda.  Caffeine.  Foods that are greasy or contain a lot of fat or  sugar.  Take over-the-counter and prescription medicines only as told by your health care provider.  Do not take sodium tablets. This can lead to having too much sodium in the body (hypernatremia).  Eat foods that contain a healthy balance of electrolytes, such as bananas, oranges, potatoes, tomatoes, and spinach.  Keep all follow-up visits as told by your health care provider. This is important. Contact a health care provider if:  You have abdominal pain that:  Gets worse.  Stays in one area (localizes).  You have a rash.  You have a stiff neck.  You are more irritable than usual.  You are sleepier or more difficult to wake up than usual.  You feel weak or dizzy.  You feel very thirsty.  You have urinated only a small amount of very dark urine over 6-8 hours. Get help right away if:  You have symptoms of severe dehydration.  You cannot drink fluids without vomiting.  Your symptoms get worse with treatment.  You have a fever.  You have a severe headache.  You have vomiting or diarrhea that:  Gets worse.  Does not go away.  You have blood or green matter (bile) in your vomit.  You have blood in your stool. This may cause stool to  look black and tarry.  You have not urinated in 6-8 hours.  You faint.  Your heart rate while sitting still is over 100 beats a minute.  You have trouble breathing. This information is not intended to replace advice given to you by your health care provider. Make sure you discuss any questions you have with your health care provider. Document Released: 08/28/2005 Document Revised: 03/24/2016 Document Reviewed: 10/22/2015 Elsevier Interactive Patient Education  2017 [employer].

## 2016-11-29 NOTE — Patient Instructions (Signed)
Implanted Port Home Guide An implanted port is a type of central line that is placed under the skin. Central lines are used to provide IV access when treatment or nutrition needs to be given through a person's veins. Implanted ports are used for long-term IV access. An implanted port may be placed because:  You need IV medicine that would be irritating to the small veins in your hands or arms.  You need long-term IV medicines, such as antibiotics.  You need IV nutrition for a long period.  You need frequent blood draws for lab tests.  You need dialysis.  Implanted ports are usually placed in the chest area, but they can also be placed in the upper arm, the abdomen, or the leg. An implanted port has two main parts:  Reservoir. The reservoir is round and will appear as a small, raised area under your skin. The reservoir is the part where a needle is inserted to give medicines or draw blood.  Catheter. The catheter is a thin, flexible tube that extends from the reservoir. The catheter is placed into a large vein. Medicine that is inserted into the reservoir goes into the catheter and then into the vein.  How will I care for my incision site? Do not get the incision site wet. Bathe or shower as directed by your health care provider. How is my port accessed? Special steps must be taken to access the port:  Before the port is accessed, a numbing cream can be placed on the skin. This helps numb the skin over the port site.  Your health care provider uses a sterile technique to access the port. ? Your health care provider must put on a mask and sterile gloves. ? The skin over your port is cleaned carefully with an antiseptic and allowed to dry. ? The port is gently pinched between sterile gloves, and a needle is inserted into the port.  Only "non-coring" port needles should be used to access the port. Once the port is accessed, a blood return should be checked. This helps ensure that the port  is in the vein and is not clogged.  If your port needs to remain accessed for a constant infusion, a clear (transparent) bandage will be placed over the needle site. The bandage and needle will need to be changed every week, or as directed by your health care provider.  Keep the bandage covering the needle clean and dry. Do not get it wet. Follow your health care provider's instructions on how to take a shower or bath while the port is accessed.  If your port does not need to stay accessed, no bandage is needed over the port.  What is flushing? Flushing helps keep the port from getting clogged. Follow your health care provider's instructions on how and when to flush the port. Ports are usually flushed with saline solution or a medicine called heparin. The need for flushing will depend on how the port is used.  If the port is used for intermittent medicines or blood draws, the port will need to be flushed: ? After medicines have been given. ? After blood has been drawn. ? As part of routine maintenance.  If a constant infusion is running, the port may not need to be flushed.  How long will my port stay implanted? The port can stay in for as long as your health care provider thinks it is needed. When it is time for the port to come out, surgery will be   done to remove it. The procedure is similar to the one performed when the port was put in. When should I seek immediate medical care? When you have an implanted port, you should seek immediate medical care if:  You notice a bad smell coming from the incision site.  You have swelling, redness, or drainage at the incision site.  You have more swelling or pain at the port site or the surrounding area.  You have a fever that is not controlled with medicine.  This information is not intended to replace advice given to you by your health care provider. Make sure you discuss any questions you have with your health care provider. Document  Released: 08/28/2005 Document Revised: 02/03/2016 Document Reviewed: 05/05/2013 Elsevier Interactive Patient Education  2017 [employer].  

## 2016-11-29 NOTE — Progress Notes (Signed)
Martha Becker OFFICE PROGRESS NOTE   Diagnosis: Colon cancer  INTERVAL HISTORY:   Martha Becker returns as scheduled. She completed another cycle of FOLFIRI/Avastin 11/08/2016. She continues to have an intermittent cough, but this has improved. She is able to go about her activities in the home. She reports a 4-5 day period feeling much better. She was able to get out of the house. She is taking thyroid hormone replacement. She reports transient discomfort at the left groin.  Objective:  Vital signs in last 24 hours:  Blood pressure 97/83, pulse 90, temperature 98.4 F (36.9 C), temperature source Oral, resp. rate 18, height 5' 6"  (1.676 m), weight 132 lb 4.8 oz (60 kg), SpO2 97 %.    HEENT: No thrush or ulcers Resp: Distant breath sounds, no respiratory distress Cardio: Regular rate and rhythm GI: No hepatomegaly, nontender Vascular: No leg edema Musculoskeletal: Mild discomfort with motion at the left hip, no tenderness at the groin or trochanter, no mass in the left groin     Portacath/PICC-without erythema  Lab Results:  Lab Results  Component Value Date   WBC 16.4 (H) 11/29/2016   HGB 11.4 (L) 11/29/2016   HCT 34.2 (L) 11/29/2016   MCV 82.7 11/29/2016   PLT 256 11/29/2016   NEUTROABS 13.9 (H) 11/29/2016  CEA on 11/29/2016: 109.41  Medications: I have reviewed the patient's current medications.  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 no loss of mismatch repair protein expression, microsatellite stable ? 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 APC alteration detected ? Staging CT scans December 2015 and PET scan 09/22/2014 consistent with metastatic bilateral lung nodules, metastatic retroperitoneal lymphadenopathy, and a probable left liver metastasis ? Cycle 1 CAPOX 10/29/2014  (Xeloda discontinued day 9 secondary to diarrhea)  ? CT 11/14/2014 revealed progressive metastatic disease at the lung bases and liver compared to a PET scan from 09/22/2014, mild improvement in retroperitoneal lymphadenopathy ? Cycle 1 FOLFOX 12/02/2014 ? Cycle 2 FOLFOX, Avastin added 12/16/2014 ? Cycle 3 FOLFOX/Avastin 12/30/2014 ? Cycle 4 FOLFOX/Avastin 01/13/2015 ? Cycle 5 FOLFOX/Avastin 01/27/2015  CT 02/10/2015 with improvement in hepatic metastases, mild improvement of lung metastases and retroperitoneal lymphadenopathy  Cycle 6 FOLFOX/Avastin 02/16/2015  Cycle 7 FOLFOX/Avastin 03/02/2015  Cycle 8 FOLFOX/Avastin 03/16/2015  Cycle 9 FOLFOX/Avastin 04/13/2015  Restaging CTs 04/20/2015 with a decrease in the size of pulmonary nodules, liver lesions, and retroperitoneal lymph nodes  Restaging CTs 07/27/2015 revealed enlargement of bilateral lung nodules, stable liver lesions, decreased periaortic adenopathy  CTs 10/05/2015 revealed further enlargement of bilateral lung nodules ,enlargement of liver lesions, and enlargement of peritoneal lymph nodes  Cycle 1 FOLFIRI/Avastin 10/12/2015  Cycle 2 FOLFIRI/Avastin 10/26/2015  Cycle 3 FOLFIRI/Avastin 11/09/2015  Cycle 4 FOLFIRI/Avastin 11/23/2015  Cycle 5 FOLFIRI/Avastin 12/28/2015  CTs 01/06/2016-decreased size of lung nodules , decreased / regression of liver lesions, regression of retroperitoneal nodes  Cycle 6 FOLFIRI/Avastin 01/11/2016 (Neulasta added)   Cycle 7 FOLFIRI/Avastin 02/01/2016 with Neulasta support  Cycle 8 FOLFIRI/Avastin 02/22/2016 with Neulasta support  Cycle 9 FOLFIRI/Avastin 03/13/2016 with Neulasta support  Cycle 10 FOLFIRI/Avastin 04/04/2016 with Neulasta support  Restaging CTs 04/17/2016-decreased size of lung and liver lesions  Patient decided on treatment break  Restaging CTs 08/22/2016-marked progression in the lungs and liver  Cycle 1 FOLFIRI/Avastin 09/25/2016  Cycle 2  FOLFIRI/Avastin 10/18/2016  Cycle 3 FOLFIRI/Avastin 11/08/2016  Restaging CTs 11/23/2016-stable lung metastases, progression of hepatic  metastases, increased right paratracheal node, seventh posterior lateral right rib fracture, T6 lucency-new, new left sacral lesion  2. Elevated TSH, PET scan 09/22/2014 with diffuse increased metabolic uptake in the thyroid   3. Family history of multiple cancers including colon cancer in her paternal grandfather and rectal cancer in a paternal first cousin  22. Admission 11/14/2014 with nausea and diarrhea, most likely secondary to capecitabine induced enteritis-resolved. CT 11/14/2014 consistent with ileitis  5.Superficial phlebitis left hand/wrist 12/30/2014.  6. Early oxaliplatin neuropathy  7. Pain at the right gluteus and right leg-MRI of the lumbar spine 07/13/2015 confirmed a disc protrusion at L5-S1  8. Diaphoresis, rhinorrhea, shortness of breath and cough following cycle 1 FOLFIRI/Avastin, did not occur following cycle 2  9. Oral mucositis secondary to chemotherapy  10. Nonproductive coughsecondary to metastatic colon cancer  11. Neutropenia secondary to chemotherapy -Neulasta added with cycle 6 FOLFIRI 01/11/2016  12. Hospitalization 10/24/2016 through 10/25/2016 with hypotension related to dehydration, anemia. Hemoglobin improved 11/08/2016.  13. Hyponatremia. 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     Disposition:  She has completed 3 cycles of salvage therapy with FOLFIRI/Avastin. Her performance status has improved. The restaging CT reveals evidence of progression in the liver. I reviewed the CT images with her. Many of the lung lesions appear smaller. The right rib fracture may be related to coughing.  There was a greater than one month gap between the comparison CT and initiation of salvage therapy. We decided to continue FOLFIRI/Avastin for 3 additional cycles. She  will then be scheduled for a restaging CT.  She will receive additional intravenous fluids with chemotherapy today and again on 12/01/2016. Martha Becker will return for an office visit and chemotherapy in 3 weeks. We will obtain a plain x-ray of the left hip and femur if the left groin pain persists.  25 minutes were spent with the patient today. The majority of the time was used for counseling and coordination of care.  Betsy Coder, MD  11/29/2016  10:36 AM

## 2016-11-30 ENCOUNTER — Telehealth: Payer: Self-pay | Admitting: *Deleted

## 2016-11-30 ENCOUNTER — Other Ambulatory Visit: Payer: Self-pay | Admitting: *Deleted

## 2016-11-30 NOTE — Telephone Encounter (Signed)
Call placed back to patient to discuss her CEA results with her.  Patient states that she does not want to discuss CEA results with the RN, she wants to discuss them with Dr. Benay Spice only.  Will inform Dr. Benay Spice of patient's request.

## 2016-11-30 NOTE — Telephone Encounter (Signed)
Called patient 3/22

## 2016-11-30 NOTE — Telephone Encounter (Signed)
Call placed back to patient after call placed to triage regarding patient's concern regarding appt times for tomorrow for pump d/c.  Patient states that she is concerned her pump was off for five hours last night.  Volume amounts and pump rate given to RN by patient.  Pump d/c time calculated for d/c time at 1:30PM on 12/01/16.  Patient states that coming in at 11:00AM for IVF's will work with her transportation.  Patient appreciative of return call and expressed that I apologize to triage nurse for her behavior with prior call.

## 2016-11-30 NOTE — Telephone Encounter (Signed)
"  Could you get a message to Dr. Benay Spice to call me [phone]).  I need to talk with him about the CEA protein level.  It's high but I feel fine.  I'm getting different communications from he and his colleagues is why I need to talk with him.  Could you also apologize to the Nurse I spoke with about my pump not working.  I was aggressive and did not want to make her feel she did not know what she was talk ing about."

## 2016-12-01 ENCOUNTER — Ambulatory Visit (HOSPITAL_BASED_OUTPATIENT_CLINIC_OR_DEPARTMENT_OTHER): Payer: 59 | Admitting: Nurse Practitioner

## 2016-12-01 ENCOUNTER — Ambulatory Visit: Payer: 59

## 2016-12-01 VITALS — Wt 134.8 lb

## 2016-12-01 VITALS — BP 99/79 | HR 95 | Temp 98.1°F | Resp 18 | Ht 66.0 in | Wt 134.8 lb

## 2016-12-01 DIAGNOSIS — Z5189 Encounter for other specified aftercare: Secondary | ICD-10-CM

## 2016-12-01 DIAGNOSIS — C7800 Secondary malignant neoplasm of unspecified lung: Secondary | ICD-10-CM

## 2016-12-01 DIAGNOSIS — Z452 Encounter for adjustment and management of vascular access device: Secondary | ICD-10-CM | POA: Diagnosis not present

## 2016-12-01 DIAGNOSIS — E860 Dehydration: Secondary | ICD-10-CM

## 2016-12-01 DIAGNOSIS — C801 Malignant (primary) neoplasm, unspecified: Principal | ICD-10-CM

## 2016-12-01 DIAGNOSIS — C786 Secondary malignant neoplasm of retroperitoneum and peritoneum: Secondary | ICD-10-CM

## 2016-12-01 DIAGNOSIS — C180 Malignant neoplasm of cecum: Secondary | ICD-10-CM | POA: Diagnosis not present

## 2016-12-01 DIAGNOSIS — Z95828 Presence of other vascular implants and grafts: Secondary | ICD-10-CM

## 2016-12-01 MED ORDER — HEPARIN SOD (PORK) LOCK FLUSH 100 UNIT/ML IV SOLN
500.0000 [IU] | INTRAVENOUS | Status: AC | PRN
  Administered 2016-12-01: 500 [IU]
  Filled 2016-12-01: qty 5

## 2016-12-01 MED ORDER — SODIUM CHLORIDE 0.9 % IV SOLN
1000.0000 mL | Freq: Once | INTRAVENOUS | Status: AC
  Administered 2016-12-01: 1000 mL via INTRAVENOUS

## 2016-12-01 MED ORDER — PEGFILGRASTIM INJECTION 6 MG/0.6ML ~~LOC~~
6.0000 mg | PREFILLED_SYRINGE | Freq: Once | SUBCUTANEOUS | Status: AC
  Administered 2016-12-01: 6 mg via SUBCUTANEOUS
  Filled 2016-12-01: qty 0.6

## 2016-12-01 MED ORDER — SODIUM CHLORIDE 0.9 % IJ SOLN
10.0000 mL | INTRAMUSCULAR | Status: AC | PRN
  Administered 2016-12-01: 10 mL
  Filled 2016-12-01: qty 10

## 2016-12-01 NOTE — Progress Notes (Signed)
RN visit for IV fluids and 5FU pump stop.  2 hours left on pump.  Will d/c at same time IV fluids are completed.  Dr. Benay Spice in to talk with patient.

## 2016-12-01 NOTE — Patient Instructions (Signed)
Dehydration, Adult Dehydration is a condition in which there is not enough fluid or water in the body. This happens when you lose more fluids than you take in. Important organs, such as the kidneys, brain, and heart, cannot function without a proper amount of fluids. Any loss of fluids from the body can lead to dehydration. Dehydration can range from mild to severe. This condition should be treated right away to prevent it from becoming severe. What are the causes? This condition may be caused by:  Vomiting.  Diarrhea.  Excessive sweating, such as from heat exposure or exercise.  Not drinking enough fluid, especially:  When ill.  While doing activity that requires a lot of energy.  Excessive urination.  Fever.  Infection.  Certain medicines, such as medicines that cause the body to lose excess fluid (diuretics).  Inability to access safe drinking water.  Reduced physical ability to get adequate water and food. What increases the risk? This condition is more likely to develop in people:  Who have a poorly controlled long-term (chronic) illness, such as diabetes, heart disease, or kidney disease.  Who are age 65 or older.  Who are disabled.  Who live in a place with high altitude.  Who play endurance sports. What are the signs or symptoms? Symptoms of mild dehydration may include:   Thirst.  Dry lips.  Slightly dry mouth.  Dry, warm skin.  Dizziness. Symptoms of moderate dehydration may include:   Very dry mouth.  Muscle cramps.  Dark urine. Urine may be the color of tea.  Decreased urine production.  Decreased tear production.  Heartbeat that is irregular or faster than normal (palpitations).  Headache.  Light-headedness, especially when you stand up from a sitting position.  Fainting (syncope). Symptoms of severe dehydration may include:   Changes in skin, such as:  Cold and clammy skin.  Blotchy (mottled) or pale skin.  Skin that does  not quickly return to normal after being lightly pinched and released (poor skin turgor).  Changes in body fluids, such as:  Extreme thirst.  No tear production.  Inability to sweat when body temperature is high, such as in hot weather.  Very little urine production.  Changes in vital signs, such as:  Weak pulse.  Pulse that is more than 100 beats a minute when sitting still.  Rapid breathing.  Low blood pressure.  Other changes, such as:  Sunken eyes.  Cold hands and feet.  Confusion.  Lack of energy (lethargy).  Difficulty waking up from sleep.  Short-term weight loss.  Unconsciousness. How is this diagnosed? This condition is diagnosed based on your symptoms and a physical exam. Blood and urine tests may be done to help confirm the diagnosis. How is this treated? Treatment for this condition depends on the severity. Mild or moderate dehydration can often be treated at home. Treatment should be started right away. Do not wait until dehydration becomes severe. Severe dehydration is an emergency and it needs to be treated in a hospital. Treatment for mild dehydration may include:   Drinking more fluids.  Replacing salts and minerals in your blood (electrolytes) that you may have lost. Treatment for moderate dehydration may include:   Drinking an oral rehydration solution (ORS). This is a drink that helps you replace fluids and electrolytes (rehydrate). It can be found at pharmacies and retail stores. Treatment for severe dehydration may include:   Receiving fluids through an IV tube.  Receiving an electrolyte solution through a feeding tube that is   passed through your nose and into your stomach (nasogastric tube, or NG tube).  Correcting any abnormalities in electrolytes.  Treating the underlying cause of dehydration. Follow these instructions at home:  If directed by your health care provider, drink an ORS:  Make an ORS by following instructions on the  package.  Start by drinking small amounts, about  cup (120 mL) every 5-10 minutes.  Slowly increase how much you drink until you have taken the amount recommended by your health care provider.  Drink enough clear fluid to keep your urine clear or pale yellow. If you were told to drink an ORS, finish the ORS first, then start slowly drinking other clear fluids. Drink fluids such as:  Water. Do not drink only water. Doing that can lead to having too little salt (sodium) in the body (hyponatremia).  Ice chips.  Fruit juice that you have added water to (diluted fruit juice).  Low-calorie sports drinks.  Avoid:  Alcohol.  Drinks that contain a lot of sugar. These include high-calorie sports drinks, fruit juice that is not diluted, and soda.  Caffeine.  Foods that are greasy or contain a lot of fat or sugar.  Take over-the-counter and prescription medicines only as told by your health care provider.  Do not take sodium tablets. This can lead to having too much sodium in the body (hypernatremia).  Eat foods that contain a healthy balance of electrolytes, such as bananas, oranges, potatoes, tomatoes, and spinach.  Keep all follow-up visits as told by your health care provider. This is important. Contact a health care provider if:  You have abdominal pain that:  Gets worse.  Stays in one area (localizes).  You have a rash.  You have a stiff neck.  You are more irritable than usual.  You are sleepier or more difficult to wake up than usual.  You feel weak or dizzy.  You feel very thirsty.  You have urinated only a small amount of very dark urine over 6-8 hours. Get help right away if:  You have symptoms of severe dehydration.  You cannot drink fluids without vomiting.  Your symptoms get worse with treatment.  You have a fever.  You have a severe headache.  You have vomiting or diarrhea that:  Gets worse.  Does not go away.  You have blood or green matter  (bile) in your vomit.  You have blood in your stool. This may cause stool to look black and tarry.  You have not urinated in 6-8 hours.  You faint.  Your heart rate while sitting still is over 100 beats a minute.  You have trouble breathing. This information is not intended to replace advice given to you by your health care provider. Make sure you discuss any questions you have with your health care provider. Document Released: 08/28/2005 Document Revised: 03/24/2016 Document Reviewed: 10/22/2015 Elsevier Interactive Patient Education  2017 [employer]  

## 2016-12-06 ENCOUNTER — Other Ambulatory Visit: Payer: Self-pay | Admitting: *Deleted

## 2016-12-06 ENCOUNTER — Telehealth: Payer: Self-pay | Admitting: *Deleted

## 2016-12-06 DIAGNOSIS — C180 Malignant neoplasm of cecum: Secondary | ICD-10-CM

## 2016-12-06 NOTE — Telephone Encounter (Signed)
Returned call to pt, she denies nausea or diarrhea. Stated she is "trying to drink" fluids. Reports feeling weak. Has intermittent shoulder pain. Offered to work in to [facility] today, she declined. Will have someone to bring her on 3/30. Dr. Benay Spice made aware. Orders received.

## 2016-12-06 NOTE — Telephone Encounter (Signed)
Received vm call from pt stating that she needed to talk with Dr Gearldine Shown nurse about coming in  Friday to see Dr Benay Spice & to get IVF.  She reports experiencing extreme fatigue.  She can be reached at [phone].  Message routed to Dr Sherrill/pod

## 2016-12-08 ENCOUNTER — Telehealth: Payer: Self-pay | Admitting: *Deleted

## 2016-12-08 ENCOUNTER — Ambulatory Visit (HOSPITAL_BASED_OUTPATIENT_CLINIC_OR_DEPARTMENT_OTHER): Payer: 59 | Admitting: Nurse Practitioner

## 2016-12-08 DIAGNOSIS — C7800 Secondary malignant neoplasm of unspecified lung: Secondary | ICD-10-CM

## 2016-12-08 DIAGNOSIS — C180 Malignant neoplasm of cecum: Secondary | ICD-10-CM

## 2016-12-08 DIAGNOSIS — R197 Diarrhea, unspecified: Secondary | ICD-10-CM

## 2016-12-08 DIAGNOSIS — C786 Secondary malignant neoplasm of retroperitoneum and peritoneum: Secondary | ICD-10-CM

## 2016-12-08 DIAGNOSIS — C801 Malignant (primary) neoplasm, unspecified: Secondary | ICD-10-CM

## 2016-12-08 MED ORDER — SODIUM CHLORIDE 0.9 % IV SOLN
INTRAVENOUS | Status: AC
  Administered 2016-12-08: 15:00:00 via INTRAVENOUS

## 2016-12-08 MED ORDER — DIPHENOXYLATE-ATROPINE 2.5-0.025 MG PO TABS: 1.0000 | ORAL_TABLET | Freq: Four times a day (QID) | ORAL | 0 refills | Status: AC | PRN

## 2016-12-08 MED ORDER — HEPARIN SOD (PORK) LOCK FLUSH 100 UNIT/ML IV SOLN
500.0000 [IU] | Freq: Once | INTRAVENOUS | Status: AC
  Administered 2016-12-08: 500 [IU] via INTRAVENOUS
  Filled 2016-12-08: qty 5

## 2016-12-08 MED ORDER — SODIUM CHLORIDE 0.9% FLUSH
10.0000 mL | Freq: Once | INTRAVENOUS | Status: AC
  Administered 2016-12-08: 10 mL via INTRAVENOUS
  Filled 2016-12-08: qty 10

## 2016-12-08 NOTE — Progress Notes (Signed)
RN visit for IV fluid only

## 2016-12-08 NOTE — Telephone Encounter (Signed)
Spoke with pt, she is aware of infusion appointment today. She reports she has had about 5 loose stools in past 24 hours. Requests refill of Lomotil.  Reviewed with Dr. Benay Spice: Refill Lomotil.

## 2016-12-08 NOTE — Patient Instructions (Signed)
Dehydration, Adult Dehydration is a condition in which there is not enough fluid or water in the body. This happens when you lose more fluids than you take in. Important organs, such as the kidneys, brain, and heart, cannot function without a proper amount of fluids. Any loss of fluids from the body can lead to dehydration. Dehydration can range from mild to severe. This condition should be treated right away to prevent it from becoming severe. What are the causes? This condition may be caused by:  Vomiting.  Diarrhea.  Excessive sweating, such as from heat exposure or exercise.  Not drinking enough fluid, especially:  When ill.  While doing activity that requires a lot of energy.  Excessive urination.  Fever.  Infection.  Certain medicines, such as medicines that cause the body to lose excess fluid (diuretics).  Inability to access safe drinking water.  Reduced physical ability to get adequate water and food. What increases the risk? This condition is more likely to develop in people:  Who have a poorly controlled long-term (chronic) illness, such as diabetes, heart disease, or kidney disease.  Who are age 65 or older.  Who are disabled.  Who live in a place with high altitude.  Who play endurance sports. What are the signs or symptoms? Symptoms of mild dehydration may include:   Thirst.  Dry lips.  Slightly dry mouth.  Dry, warm skin.  Dizziness. Symptoms of moderate dehydration may include:   Very dry mouth.  Muscle cramps.  Dark urine. Urine may be the color of tea.  Decreased urine production.  Decreased tear production.  Heartbeat that is irregular or faster than normal (palpitations).  Headache.  Light-headedness, especially when you stand up from a sitting position.  Fainting (syncope). Symptoms of severe dehydration may include:   Changes in skin, such as:  Cold and clammy skin.  Blotchy (mottled) or pale skin.  Skin that does  not quickly return to normal after being lightly pinched and released (poor skin turgor).  Changes in body fluids, such as:  Extreme thirst.  No tear production.  Inability to sweat when body temperature is high, such as in hot weather.  Very little urine production.  Changes in vital signs, such as:  Weak pulse.  Pulse that is more than 100 beats a minute when sitting still.  Rapid breathing.  Low blood pressure.  Other changes, such as:  Sunken eyes.  Cold hands and feet.  Confusion.  Lack of energy (lethargy).  Difficulty waking up from sleep.  Short-term weight loss.  Unconsciousness. How is this diagnosed? This condition is diagnosed based on your symptoms and a physical exam. Blood and urine tests may be done to help confirm the diagnosis. How is this treated? Treatment for this condition depends on the severity. Mild or moderate dehydration can often be treated at home. Treatment should be started right away. Do not wait until dehydration becomes severe. Severe dehydration is an emergency and it needs to be treated in a hospital. Treatment for mild dehydration may include:   Drinking more fluids.  Replacing salts and minerals in your blood (electrolytes) that you may have lost. Treatment for moderate dehydration may include:   Drinking an oral rehydration solution (ORS). This is a drink that helps you replace fluids and electrolytes (rehydrate). It can be found at pharmacies and retail stores. Treatment for severe dehydration may include:   Receiving fluids through an IV tube.  Receiving an electrolyte solution through a feeding tube that is   passed through your nose and into your stomach (nasogastric tube, or NG tube).  Correcting any abnormalities in electrolytes.  Treating the underlying cause of dehydration. Follow these instructions at home:  If directed by your health care provider, drink an ORS:  Make an ORS by following instructions on the  package.  Start by drinking small amounts, about  cup (120 mL) every 5-10 minutes.  Slowly increase how much you drink until you have taken the amount recommended by your health care provider.  Drink enough clear fluid to keep your urine clear or pale yellow. If you were told to drink an ORS, finish the ORS first, then start slowly drinking other clear fluids. Drink fluids such as:  Water. Do not drink only water. Doing that can lead to having too little salt (sodium) in the body (hyponatremia).  Ice chips.  Fruit juice that you have added water to (diluted fruit juice).  Low-calorie sports drinks.  Avoid:  Alcohol.  Drinks that contain a lot of sugar. These include high-calorie sports drinks, fruit juice that is not diluted, and soda.  Caffeine.  Foods that are greasy or contain a lot of fat or sugar.  Take over-the-counter and prescription medicines only as told by your health care provider.  Do not take sodium tablets. This can lead to having too much sodium in the body (hypernatremia).  Eat foods that contain a healthy balance of electrolytes, such as bananas, oranges, potatoes, tomatoes, and spinach.  Keep all follow-up visits as told by your health care provider. This is important. Contact a health care provider if:  You have abdominal pain that:  Gets worse.  Stays in one area (localizes).  You have a rash.  You have a stiff neck.  You are more irritable than usual.  You are sleepier or more difficult to wake up than usual.  You feel weak or dizzy.  You feel very thirsty.  You have urinated only a small amount of very dark urine over 6-8 hours. Get help right away if:  You have symptoms of severe dehydration.  You cannot drink fluids without vomiting.  Your symptoms get worse with treatment.  You have a fever.  You have a severe headache.  You have vomiting or diarrhea that:  Gets worse.  Does not go away.  You have blood or green matter  (bile) in your vomit.  You have blood in your stool. This may cause stool to look black and tarry.  You have not urinated in 6-8 hours.  You faint.  Your heart rate while sitting still is over 100 beats a minute.  You have trouble breathing. This information is not intended to replace advice given to you by your health care provider. Make sure you discuss any questions you have with your health care provider. Document Released: 08/28/2005 Document Revised: 03/24/2016 Document Reviewed: 10/22/2015 Elsevier Interactive Patient Education  2017 [employer]  

## 2016-12-15 ENCOUNTER — Other Ambulatory Visit: Payer: Self-pay | Admitting: Nurse Practitioner

## 2016-12-15 DIAGNOSIS — C180 Malignant neoplasm of cecum: Secondary | ICD-10-CM

## 2016-12-15 DIAGNOSIS — C801 Malignant (primary) neoplasm, unspecified: Secondary | ICD-10-CM

## 2016-12-15 DIAGNOSIS — C786 Secondary malignant neoplasm of retroperitoneum and peritoneum: Secondary | ICD-10-CM

## 2016-12-15 DIAGNOSIS — C7800 Secondary malignant neoplasm of unspecified lung: Secondary | ICD-10-CM

## 2016-12-17 ENCOUNTER — Other Ambulatory Visit: Payer: Self-pay | Admitting: Oncology

## 2016-12-18 ENCOUNTER — Other Ambulatory Visit: Payer: Self-pay | Admitting: Nurse Practitioner

## 2016-12-18 ENCOUNTER — Other Ambulatory Visit: Payer: Self-pay | Admitting: *Deleted

## 2016-12-18 ENCOUNTER — Telehealth: Payer: Self-pay | Admitting: Nurse Practitioner

## 2016-12-18 DIAGNOSIS — C7800 Secondary malignant neoplasm of unspecified lung: Secondary | ICD-10-CM

## 2016-12-18 DIAGNOSIS — C180 Malignant neoplasm of cecum: Secondary | ICD-10-CM

## 2016-12-18 DIAGNOSIS — C786 Secondary malignant neoplasm of retroperitoneum and peritoneum: Secondary | ICD-10-CM

## 2016-12-18 DIAGNOSIS — C801 Malignant (primary) neoplasm, unspecified: Secondary | ICD-10-CM

## 2016-12-18 MED ORDER — TRAMADOL HCL 50 MG PO TABS: 25.0000 mg | ORAL_TABLET | Freq: Four times a day (QID) | ORAL | 0 refills | Status: DC | PRN

## 2016-12-18 NOTE — Telephone Encounter (Signed)
Per Lattie Haw, NP called refill for Tramadol to the [facility] in [place] spoke with Morey Hummingbird, pharmacist.

## 2016-12-18 NOTE — Telephone Encounter (Signed)
Patient would like to move her appointment on Wed  Please call with new appointment   [phone]

## 2016-12-19 NOTE — Telephone Encounter (Signed)
Spoke with patient today re request to move 4/11 appointments to a later time. After checking with provider appointments not able to be adjusted (no availability).

## 2016-12-20 ENCOUNTER — Other Ambulatory Visit (HOSPITAL_BASED_OUTPATIENT_CLINIC_OR_DEPARTMENT_OTHER): Payer: 59

## 2016-12-20 ENCOUNTER — Ambulatory Visit (HOSPITAL_BASED_OUTPATIENT_CLINIC_OR_DEPARTMENT_OTHER): Payer: 59 | Admitting: Nurse Practitioner

## 2016-12-20 ENCOUNTER — Telehealth: Payer: Self-pay | Admitting: Nurse Practitioner

## 2016-12-20 ENCOUNTER — Ambulatory Visit: Payer: 59

## 2016-12-20 ENCOUNTER — Ambulatory Visit (HOSPITAL_BASED_OUTPATIENT_CLINIC_OR_DEPARTMENT_OTHER): Payer: 59

## 2016-12-20 VITALS — BP 134/96 | HR 81 | Temp 97.9°F | Resp 18 | Ht 66.0 in | Wt 128.9 lb

## 2016-12-20 DIAGNOSIS — C787 Secondary malignant neoplasm of liver and intrahepatic bile duct: Secondary | ICD-10-CM

## 2016-12-20 DIAGNOSIS — M79606 Pain in leg, unspecified: Secondary | ICD-10-CM

## 2016-12-20 DIAGNOSIS — C801 Malignant (primary) neoplasm, unspecified: Secondary | ICD-10-CM

## 2016-12-20 DIAGNOSIS — C180 Malignant neoplasm of cecum: Secondary | ICD-10-CM

## 2016-12-20 DIAGNOSIS — M899 Disorder of bone, unspecified: Secondary | ICD-10-CM

## 2016-12-20 DIAGNOSIS — Z8672 Personal history of thrombophlebitis: Secondary | ICD-10-CM

## 2016-12-20 DIAGNOSIS — M25559 Pain in unspecified hip: Secondary | ICD-10-CM

## 2016-12-20 DIAGNOSIS — Z800 Family history of malignant neoplasm of digestive organs: Secondary | ICD-10-CM | POA: Diagnosis not present

## 2016-12-20 DIAGNOSIS — Z5111 Encounter for antineoplastic chemotherapy: Secondary | ICD-10-CM | POA: Diagnosis not present

## 2016-12-20 DIAGNOSIS — Z95828 Presence of other vascular implants and grafts: Secondary | ICD-10-CM

## 2016-12-20 DIAGNOSIS — Z79899 Other long term (current) drug therapy: Secondary | ICD-10-CM | POA: Diagnosis not present

## 2016-12-20 DIAGNOSIS — C786 Secondary malignant neoplasm of retroperitoneum and peritoneum: Secondary | ICD-10-CM

## 2016-12-20 DIAGNOSIS — C7801 Secondary malignant neoplasm of right lung: Secondary | ICD-10-CM

## 2016-12-20 DIAGNOSIS — C7800 Secondary malignant neoplasm of unspecified lung: Secondary | ICD-10-CM

## 2016-12-20 DIAGNOSIS — C189 Malignant neoplasm of colon, unspecified: Secondary | ICD-10-CM

## 2016-12-20 DIAGNOSIS — C7802 Secondary malignant neoplasm of left lung: Secondary | ICD-10-CM

## 2016-12-20 DIAGNOSIS — Z5112 Encounter for antineoplastic immunotherapy: Secondary | ICD-10-CM

## 2016-12-20 LAB — CBC WITH DIFFERENTIAL/PLATELET
BASO%: 0.9 % (ref 0.0–2.0)
Basophils Absolute: 0.1 10*3/uL (ref 0.0–0.1)
EOS%: 1.5 % (ref 0.0–7.0)
Eosinophils Absolute: 0.2 10*3/uL (ref 0.0–0.5)
HCT: 31.9 % — ABNORMAL LOW (ref 34.8–46.6)
HGB: 10.7 g/dL — ABNORMAL LOW (ref 11.6–15.9)
LYMPH%: 6.9 % — ABNORMAL LOW (ref 14.0–49.7)
MCH: 28 pg (ref 25.1–34.0)
MCHC: 33.5 g/dL (ref 31.5–36.0)
MCV: 83.5 fL (ref 79.5–101.0)
MONO#: 1.1 10*3/uL — ABNORMAL HIGH (ref 0.1–0.9)
MONO%: 8.7 % (ref 0.0–14.0)
NEUT#: 10.8 10*3/uL — ABNORMAL HIGH (ref 1.5–6.5)
NEUT%: 82 % — ABNORMAL HIGH (ref 38.4–76.8)
Platelets: 294 10*3/uL (ref 145–400)
RBC: 3.82 10*6/uL (ref 3.70–5.45)
RDW: 18.8 % — ABNORMAL HIGH (ref 11.2–14.5)
WBC: 13.2 10*3/uL — ABNORMAL HIGH (ref 3.9–10.3)
lymph#: 0.9 10*3/uL (ref 0.9–3.3)

## 2016-12-20 LAB — COMPREHENSIVE METABOLIC PANEL
ALBUMIN: 3.2 g/dL — AB (ref 3.5–5.0)
ALK PHOS: 206 U/L — AB (ref 40–150)
ALT: 14 U/L (ref 0–55)
ANION GAP: 14 meq/L — AB (ref 3–11)
AST: 26 U/L (ref 5–34)
BUN: 11.8 mg/dL (ref 7.0–26.0)
CALCIUM: 10.1 mg/dL (ref 8.4–10.4)
CHLORIDE: 96 meq/L — AB (ref 98–109)
CO2: 23 mEq/L (ref 22–29)
Creatinine: 0.7 mg/dL (ref 0.6–1.1)
Glucose: 80 mg/dl (ref 70–140)
Potassium: 4.5 mEq/L (ref 3.5–5.1)
Sodium: 133 mEq/L — ABNORMAL LOW (ref 136–145)
Total Bilirubin: 0.37 mg/dL (ref 0.20–1.20)
Total Protein: 7 g/dL (ref 6.4–8.3)

## 2016-12-20 LAB — TSH: TSH: 6.627 m(IU)/L — ABNORMAL HIGH (ref 0.308–3.960)

## 2016-12-20 LAB — CEA (IN HOUSE-CHCC): CEA (CHCC-In House): 92.91 ng/mL — ABNORMAL HIGH (ref 0.00–5.00)

## 2016-12-20 LAB — UA PROTEIN, DIPSTICK - CHCC: Protein, ur: NEGATIVE mg/dL

## 2016-12-20 MED ORDER — FAMOTIDINE IN NACL 20-0.9 MG/50ML-% IV SOLN
20.0000 mg | Freq: Two times a day (BID) | INTRAVENOUS | Status: DC
  Administered 2016-12-20: 20 mg via INTRAVENOUS

## 2016-12-20 MED ORDER — SCOPOLAMINE 1 MG/3DAYS TD PT72: 1.0000 | MEDICATED_PATCH | TRANSDERMAL | 0 refills | Status: DC

## 2016-12-20 MED ORDER — SODIUM CHLORIDE 0.9 % IV SOLN
1800.0000 mg/m2 | INTRAVENOUS | Status: DC
  Administered 2016-12-20: 3150 mg via INTRAVENOUS
  Filled 2016-12-20: qty 63

## 2016-12-20 MED ORDER — PALONOSETRON HCL INJECTION 0.25 MG/5ML
INTRAVENOUS | Status: AC
  Filled 2016-12-20: qty 5

## 2016-12-20 MED ORDER — DIPHENHYDRAMINE HCL 50 MG/ML IJ SOLN
INTRAMUSCULAR | Status: AC
  Filled 2016-12-20: qty 1

## 2016-12-20 MED ORDER — SODIUM CHLORIDE 0.9 % IV SOLN
Freq: Once | INTRAVENOUS | Status: AC
  Administered 2016-12-20: 11:00:00 via INTRAVENOUS
  Filled 2016-12-20: qty 5

## 2016-12-20 MED ORDER — SODIUM CHLORIDE 0.9 % IV SOLN
300.0000 mg | Freq: Once | INTRAVENOUS | Status: AC
  Administered 2016-12-20: 300 mg via INTRAVENOUS
  Filled 2016-12-20: qty 12

## 2016-12-20 MED ORDER — ATROPINE SULFATE 1 MG/ML IJ SOLN
INTRAMUSCULAR | Status: AC
  Filled 2016-12-20: qty 1

## 2016-12-20 MED ORDER — SODIUM CHLORIDE 0.9 % IV SOLN
Freq: Once | INTRAVENOUS | Status: AC
  Administered 2016-12-20: 11:00:00 via INTRAVENOUS

## 2016-12-20 MED ORDER — FAMOTIDINE IN NACL 20-0.9 MG/50ML-% IV SOLN
INTRAVENOUS | Status: AC
  Filled 2016-12-20: qty 50

## 2016-12-20 MED ORDER — DIPHENHYDRAMINE HCL 50 MG/ML IJ SOLN
25.0000 mg | Freq: Once | INTRAMUSCULAR | Status: AC
  Administered 2016-12-20: 25 mg via INTRAVENOUS

## 2016-12-20 MED ORDER — IRINOTECAN HCL CHEMO INJECTION 100 MG/5ML
170.0000 mg/m2 | Freq: Once | INTRAVENOUS | Status: AC
  Administered 2016-12-20: 300 mg via INTRAVENOUS
  Filled 2016-12-20: qty 15

## 2016-12-20 MED ORDER — PALONOSETRON HCL INJECTION 0.25 MG/5ML
0.2500 mg | Freq: Once | INTRAVENOUS | Status: AC
  Administered 2016-12-20: 0.25 mg via INTRAVENOUS

## 2016-12-20 MED ORDER — ATROPINE SULFATE 1 MG/ML IJ SOLN
0.5000 mg | Freq: Once | INTRAMUSCULAR | Status: AC | PRN
  Administered 2016-12-20: 0.5 mg via INTRAVENOUS

## 2016-12-20 MED ORDER — SODIUM CHLORIDE 0.9 % IJ SOLN
10.0000 mL | INTRAMUSCULAR | Status: AC | PRN
  Administered 2016-12-20: 10 mL
  Filled 2016-12-20: qty 10

## 2016-12-20 MED ORDER — LEUCOVORIN CALCIUM INJECTION 350 MG
300.0000 mg/m2 | Freq: Once | INTRAVENOUS | Status: AC
  Administered 2016-12-20: 526 mg via INTRAVENOUS
  Filled 2016-12-20: qty 26.3

## 2016-12-20 MED ORDER — FLUOROURACIL CHEMO INJECTION 2.5 GM/50ML
300.0000 mg/m2 | Freq: Once | INTRAVENOUS | Status: AC
  Administered 2016-12-20: 550 mg via INTRAVENOUS
  Filled 2016-12-20: qty 11

## 2016-12-20 NOTE — Patient Instructions (Signed)
[facility] Discharge Instructions for Patients Receiving Chemotherapy  Today you received the following chemotherapy agents Avastin/Leucovorin,Camptosar, Adrucil  To help prevent nausea and vomiting after your treatment, we encourage you to take your nausea medication    If you develop nausea and vomiting that is not controlled by your nausea medication, call the clinic.   BELOW ARE SYMPTOMS THAT SHOULD BE REPORTED IMMEDIATELY:  *FEVER GREATER THAN 100.5 F  *CHILLS WITH OR WITHOUT FEVER  NAUSEA AND VOMITING THAT IS NOT CONTROLLED WITH YOUR NAUSEA MEDICATION  *UNUSUAL SHORTNESS OF BREATH  *UNUSUAL BRUISING OR BLEEDING  TENDERNESS IN MOUTH AND THROAT WITH OR WITHOUT PRESENCE OF ULCERS  *URINARY PROBLEMS  *BOWEL PROBLEMS  UNUSUAL RASH Items with * indicate a potential emergency and should be followed up as soon as possible.  Feel free to call the clinic you have any questions or concerns. The clinic phone number is [phone].  Please show the [facility] at check-in to the Emergency Department and triage nurse.

## 2016-12-20 NOTE — Telephone Encounter (Signed)
Scheduled appt per 4/11 los. - Patient went to the treatment before schedule finished - says shes my chart active and will check my chart

## 2016-12-20 NOTE — Progress Notes (Signed)
[facility] OFFICE PROGRESS NOTE   Diagnosis:  Colon cancer  INTERVAL HISTORY:   Ms. Colter returns as scheduled. She completed cycle 4 FOLFIRI/Avastin 11/29/2016. She denies significant nausea. No vomiting. No mouth sores. She had loose stools for about 10 days. At the most she was having 2-3 per day. She took Lomotil and perceived no significant improvement. She continues to have intermittent left hip pain. The pain radiates into the left thigh region. No back pain. No bowel or bladder dysfunction. She is taking 650 mg of aspirin multiple times a day. She notes increased salivation following each treatment. Main complaint is fatigue. Cough is better. She notes blood with nose blowing. No other bleeding.  Objective:  Vital signs in last 24 hours:  Blood pressure (!) 134/96, pulse 81, temperature 97.9 F (36.6 C), temperature source Oral, resp. rate 18, height 5' 6"  (1.676 m), weight 128 lb 14.4 oz (58.5 kg), SpO2 100 %.    HEENT: No thrush or ulcers. Resp: Lungs clear bilaterally. Cardio: Regular rate and rhythm. GI: Abdomen soft and nontender. No hepatomegaly. Vascular: No leg edema. Neuro: Alert and oriented. Leg strength 5 over 5.  Port-A-Cath without erythema.  Lab Results:  Lab Results  Component Value Date   WBC 16.4 (H) 11/29/2016   HGB 11.4 (L) 11/29/2016   HCT 34.2 (L) 11/29/2016   MCV 82.7 11/29/2016   PLT 256 11/29/2016   NEUTROABS 13.9 (H) 11/29/2016    Imaging:  No results found.  Medications: I have reviewed the patient's current medications.  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 no loss of mismatch repair protein expression, microsatellite stable ? 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 APC alteration detected ? Staging CT scans December 2015 and PET scan 09/22/2014  consistent with metastatic bilateral lung nodules, metastatic retroperitoneal lymphadenopathy, and a probable left liver metastasis ? Cycle 1 CAPOX 10/29/2014 (Xeloda discontinued day 9 secondary to diarrhea)  ? CT 11/14/2014 revealed progressive metastatic disease at the lung bases and liver compared to a PET scan from 09/22/2014, mild improvement in retroperitoneal lymphadenopathy ? Cycle 1 FOLFOX 12/02/2014 ? Cycle 2 FOLFOX, Avastin added 12/16/2014 ? Cycle 3 FOLFOX/Avastin 12/30/2014 ? Cycle 4 FOLFOX/Avastin 01/13/2015 ? Cycle 5 FOLFOX/Avastin 01/27/2015  CT 02/10/2015 with improvement in hepatic metastases, mild improvement of lung metastases and retroperitoneal lymphadenopathy  Cycle 6 FOLFOX/Avastin 02/16/2015  Cycle 7 FOLFOX/Avastin 03/02/2015  Cycle 8 FOLFOX/Avastin 03/16/2015  Cycle 9 FOLFOX/Avastin 04/13/2015  Restaging CTs 04/20/2015 with a decrease in the size of pulmonary nodules, liver lesions, and retroperitoneal lymph nodes  Restaging CTs 07/27/2015 revealed enlargement of bilateral lung nodules, stable liver lesions, decreased periaortic adenopathy  CTs 10/05/2015 revealed further enlargement of bilateral lung nodules ,enlargement of liver lesions, and enlargement of peritoneal lymph nodes  Cycle 1 FOLFIRI/Avastin 10/12/2015  Cycle 2 FOLFIRI/Avastin 10/26/2015  Cycle 3 FOLFIRI/Avastin 11/09/2015  Cycle 4 FOLFIRI/Avastin 11/23/2015  Cycle 5 FOLFIRI/Avastin 12/28/2015  CTs 01/06/2016-decreased size of lung nodules , decreased / regression of liver lesions, regression of retroperitoneal nodes  Cycle 6 FOLFIRI/Avastin 01/11/2016 (Neulasta added)   Cycle 7 FOLFIRI/Avastin 02/01/2016 with Neulasta support  Cycle 8 FOLFIRI/Avastin 02/22/2016 with Neulasta support  Cycle 9 FOLFIRI/Avastin 03/13/2016 with Neulasta support  Cycle 10 FOLFIRI/Avastin 04/04/2016 with Neulasta support  Restaging CTs 04/17/2016-decreased size of lung and liver lesions  Patient  decided on treatment break  Restaging CTs 08/22/2016-marked progression in the  lungs and liver  Cycle 1 FOLFIRI/Avastin 09/25/2016  Cycle 2 FOLFIRI/Avastin 10/18/2016  Cycle 3 FOLFIRI/Avastin 11/08/2016  Restaging CTs 11/23/2016-stable lung metastases, progression of hepatic metastases, increased right paratracheal node, seventh posterior lateral right rib fracture, T6 lucency-new, new left sacral lesion  Cycle 4 FOLFIRI/Avastin 11/29/2016  Cycle 5 FOLFIRI/Avastin 12/20/2016  2. Elevated TSH, PET scan 09/22/2014 with diffuse increased metabolic uptake in the thyroid   3. Family history of multiple cancers including colon cancer in her paternal grandfather and rectal cancer in a paternal first cousin  77. Admission 11/14/2014 with nausea and diarrhea, most likely secondary to capecitabine induced enteritis-resolved. CT 11/14/2014 consistent with ileitis  5.Superficial phlebitis left hand/wrist 12/30/2014.  6. Early oxaliplatin neuropathy  7. Pain at the right gluteus and right leg-MRI of the lumbar spine 07/13/2015 confirmed a disc protrusion at L5-S1  8. Diaphoresis, rhinorrhea, shortness of breath and cough following cycle 1 FOLFIRI/Avastin, did not occur following cycle 2  9. Oral mucositis secondary to chemotherapy  10. Nonproductive coughsecondary to metastatic colon cancer  11. Neutropenia secondary to chemotherapy -Neulasta added with cycle 6 FOLFIRI 01/11/2016  12. Hospitalization 10/24/2016 through 10/25/2016 with hypotension related to dehydration, anemia. Hemoglobin improved 11/08/2016.  13. Hyponatremia. 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     Disposition: Ms. Fishel has completed 4 cycles of FOLFIRI/Avastin. Her performance status has improved considerably. Plan to proceed with cycle 5 today as scheduled.  We discussed that the increased salivation is likely related to irinotecan. She was given a  prescription for a scopolamine patch. Potential side effects reviewed.  She continues to have intermittent left hip/leg pain. She is taking a significant amount of aspirin. We discussed a Medrol Dosepak. We also discussed an MRI. She would like to hold on both for now. She will try ibuprofen in place of the aspirin.  She will return for a follow-up visit and cycle 6 FOLFIRI/Avastin in 3 weeks. She will contact the office in the interim with any problems.  Plan reviewed with Dr. Benay Spice. 30 minutes were spent face-to-face at today's visit with the majority of that time involved in counseling/coordination of care.    Ned Card ANP/GNP-BC   12/20/2016  9:12 AM

## 2016-12-22 ENCOUNTER — Ambulatory Visit: Payer: 59 | Admitting: Nurse Practitioner

## 2016-12-22 ENCOUNTER — Ambulatory Visit: Payer: 59

## 2016-12-22 ENCOUNTER — Ambulatory Visit (HOSPITAL_BASED_OUTPATIENT_CLINIC_OR_DEPARTMENT_OTHER): Payer: 59

## 2016-12-22 VITALS — BP 94/63 | HR 88 | Temp 97.6°F | Resp 16

## 2016-12-22 VITALS — BP 126/93 | HR 86 | Resp 16

## 2016-12-22 DIAGNOSIS — Z5189 Encounter for other specified aftercare: Secondary | ICD-10-CM

## 2016-12-22 DIAGNOSIS — C7802 Secondary malignant neoplasm of left lung: Secondary | ICD-10-CM | POA: Diagnosis not present

## 2016-12-22 DIAGNOSIS — C180 Malignant neoplasm of cecum: Secondary | ICD-10-CM

## 2016-12-22 DIAGNOSIS — C7801 Secondary malignant neoplasm of right lung: Secondary | ICD-10-CM | POA: Diagnosis not present

## 2016-12-22 DIAGNOSIS — C787 Secondary malignant neoplasm of liver and intrahepatic bile duct: Secondary | ICD-10-CM

## 2016-12-22 DIAGNOSIS — Z95828 Presence of other vascular implants and grafts: Secondary | ICD-10-CM

## 2016-12-22 MED ORDER — SODIUM CHLORIDE 0.9 % IV SOLN
Freq: Once | INTRAVENOUS | Status: AC
  Administered 2016-12-22: 14:00:00 via INTRAVENOUS

## 2016-12-22 MED ORDER — SODIUM CHLORIDE 0.9% FLUSH
10.0000 mL | INTRAVENOUS | Status: DC | PRN
  Administered 2016-12-22 (×2): 10 mL
  Filled 2016-12-22: qty 10

## 2016-12-22 MED ORDER — HEPARIN SOD (PORK) LOCK FLUSH 100 UNIT/ML IV SOLN
500.0000 [IU] | Freq: Once | INTRAVENOUS | Status: AC | PRN
  Administered 2016-12-22: 500 [IU]
  Filled 2016-12-22: qty 5

## 2016-12-22 MED ORDER — HEPARIN SOD (PORK) LOCK FLUSH 100 UNIT/ML IV SOLN
500.0000 [IU] | INTRAVENOUS | Status: AC | PRN
  Administered 2016-12-22: 500 [IU]
  Filled 2016-12-22: qty 5

## 2016-12-22 MED ORDER — SODIUM CHLORIDE 0.9 % IJ SOLN
10.0000 mL | INTRAMUSCULAR | Status: DC | PRN
  Filled 2016-12-22: qty 10

## 2016-12-22 MED ORDER — PEGFILGRASTIM INJECTION 6 MG/0.6ML ~~LOC~~
6.0000 mg | PREFILLED_SYRINGE | Freq: Once | SUBCUTANEOUS | Status: AC
Start: 2016-12-22 — End: 2016-12-22
  Administered 2016-12-22: 6 mg via SUBCUTANEOUS
  Filled 2016-12-22: qty 0.6

## 2016-12-22 NOTE — Progress Notes (Signed)
Neulasta given during pump dc appointment.

## 2016-12-22 NOTE — Patient Instructions (Signed)
Pegfilgrastim injection What is this medicine? PEGFILGRASTIM (PEG fil gra stim) is a long-acting granulocyte colony-stimulating factor that stimulates the growth of neutrophils, a type of white blood cell important in the body's fight against infection. It is used to reduce the incidence of fever and infection in patients with certain types of cancer who are receiving chemotherapy that affects the bone marrow, and to increase survival after being exposed to high doses of radiation. This medicine may be used for other purposes; ask your health care provider or pharmacist if you have questions. COMMON BRAND NAME(S): Neulasta What should I tell my health care provider before I take this medicine? They need to know if you have any of these conditions: -kidney disease -latex allergy -ongoing radiation therapy -sickle cell disease -skin reactions to acrylic adhesives (On-Body Injector only) -an unusual or allergic reaction to pegfilgrastim, filgrastim, other medicines, foods, dyes, or preservatives -pregnant or trying to get pregnant -breast-feeding How should I use this medicine? This medicine is for injection under the skin. If you get this medicine at home, you will be taught how to prepare and give the pre-filled syringe or how to use the On-body Injector. Refer to the patient Instructions for Use for detailed instructions. Use exactly as directed. Tell your healthcare provider immediately if you suspect that the On-body Injector may not have performed as intended or if you suspect the use of the On-body Injector resulted in a missed or partial dose. It is important that you put your used needles and syringes in a special sharps container. Do not put them in a trash can. If you do not have a sharps container, call your pharmacist or healthcare provider to get one. Talk to your pediatrician regarding the use of this medicine in children. While this drug may be prescribed for selected conditions,  precautions do apply. Overdosage: If you think you have taken too much of this medicine contact a poison control center or emergency room at once. NOTE: This medicine is only for you. Do not share this medicine with others. What if I miss a dose? It is important not to miss your dose. Call your doctor or health care professional if you miss your dose. If you miss a dose due to an On-body Injector failure or leakage, a new dose should be administered as soon as possible using a single prefilled syringe for manual use. What may interact with this medicine? Interactions have not been studied. Give your health care provider a list of all the medicines, herbs, non-prescription drugs, or dietary supplements you use. Also tell them if you smoke, drink alcohol, or use illegal drugs. Some items may interact with your medicine. This list may not describe all possible interactions. Give your health care provider a list of all the medicines, herbs, non-prescription drugs, or dietary supplements you use. Also tell them if you smoke, drink alcohol, or use illegal drugs. Some items may interact with your medicine. What should I watch for while using this medicine? You may need blood work done while you are taking this medicine. If you are going to need a MRI, CT scan, or other procedure, tell your doctor that you are using this medicine (On-Body Injector only). What side effects may I notice from receiving this medicine? Side effects that you should report to your doctor or health care professional as soon as possible: -allergic reactions like skin rash, itching or hives, swelling of the face, lips, or tongue -dizziness -fever -pain, redness, or irritation at site   where injected -pinpoint red spots on the skin -red or dark-brown urine -shortness of breath or breathing problems -stomach or side pain, or pain at the shoulder -swelling -tiredness -trouble passing urine or change in the amount of urine Side  effects that usually do not require medical attention (report to your doctor or health care professional if they continue or are bothersome): -bone pain -muscle pain This list may not describe all possible side effects. Call your doctor for medical advice about side effects. You may report side effects to FDA at 1-800-FDA-1088. Where should I keep my medicine? Keep out of the reach of children. Store pre-filled syringes in a refrigerator between 2 and 8 degrees C (36 and 46 degrees F). Do not freeze. Keep in carton to protect from light. Throw away this medicine if it is left out of the refrigerator for more than 48 hours. Throw away any unused medicine after the expiration date. NOTE: This sheet is a summary. It may not cover all possible information. If you have questions about this medicine, talk to your doctor, pharmacist, or health care provider.  2018 Elsevier/Gold Standard (2016-08-24 12:58:03)  

## 2016-12-22 NOTE — Progress Notes (Signed)
RN visit for IV fluids. 

## 2016-12-22 NOTE — Patient Instructions (Signed)
Dehydration, Adult Dehydration is a condition in which there is not enough fluid or water in the body. This happens when you lose more fluids than you take in. Important organs, such as the kidneys, brain, and heart, cannot function without a proper amount of fluids. Any loss of fluids from the body can lead to dehydration. Dehydration can range from mild to severe. This condition should be treated right away to prevent it from becoming severe. What are the causes? This condition may be caused by:  Vomiting.  Diarrhea.  Excessive sweating, such as from heat exposure or exercise.  Not drinking enough fluid, especially:  When ill.  While doing activity that requires a lot of energy.  Excessive urination.  Fever.  Infection.  Certain medicines, such as medicines that cause the body to lose excess fluid (diuretics).  Inability to access safe drinking water.  Reduced physical ability to get adequate water and food. What increases the risk? This condition is more likely to develop in people:  Who have a poorly controlled long-term (chronic) illness, such as diabetes, heart disease, or kidney disease.  Who are age 65 or older.  Who are disabled.  Who live in a place with high altitude.  Who play endurance sports. What are the signs or symptoms? Symptoms of mild dehydration may include:   Thirst.  Dry lips.  Slightly dry mouth.  Dry, warm skin.  Dizziness. Symptoms of moderate dehydration may include:   Very dry mouth.  Muscle cramps.  Dark urine. Urine may be the color of tea.  Decreased urine production.  Decreased tear production.  Heartbeat that is irregular or faster than normal (palpitations).  Headache.  Light-headedness, especially when you stand up from a sitting position.  Fainting (syncope). Symptoms of severe dehydration may include:   Changes in skin, such as:  Cold and clammy skin.  Blotchy (mottled) or pale skin.  Skin that does  not quickly return to normal after being lightly pinched and released (poor skin turgor).  Changes in body fluids, such as:  Extreme thirst.  No tear production.  Inability to sweat when body temperature is high, such as in hot weather.  Very little urine production.  Changes in vital signs, such as:  Weak pulse.  Pulse that is more than 100 beats a minute when sitting still.  Rapid breathing.  Low blood pressure.  Other changes, such as:  Sunken eyes.  Cold hands and feet.  Confusion.  Lack of energy (lethargy).  Difficulty waking up from sleep.  Short-term weight loss.  Unconsciousness. How is this diagnosed? This condition is diagnosed based on your symptoms and a physical exam. Blood and urine tests may be done to help confirm the diagnosis. How is this treated? Treatment for this condition depends on the severity. Mild or moderate dehydration can often be treated at home. Treatment should be started right away. Do not wait until dehydration becomes severe. Severe dehydration is an emergency and it needs to be treated in a hospital. Treatment for mild dehydration may include:   Drinking more fluids.  Replacing salts and minerals in your blood (electrolytes) that you may have lost. Treatment for moderate dehydration may include:   Drinking an oral rehydration solution (ORS). This is a drink that helps you replace fluids and electrolytes (rehydrate). It can be found at pharmacies and retail stores. Treatment for severe dehydration may include:   Receiving fluids through an IV tube.  Receiving an electrolyte solution through a feeding tube that is   passed through your nose and into your stomach (nasogastric tube, or NG tube).  Correcting any abnormalities in electrolytes.  Treating the underlying cause of dehydration. Follow these instructions at home:  If directed by your health care provider, drink an ORS:  Make an ORS by following instructions on the  package.  Start by drinking small amounts, about  cup (120 mL) every 5-10 minutes.  Slowly increase how much you drink until you have taken the amount recommended by your health care provider.  Drink enough clear fluid to keep your urine clear or pale yellow. If you were told to drink an ORS, finish the ORS first, then start slowly drinking other clear fluids. Drink fluids such as:  Water. Do not drink only water. Doing that can lead to having too little salt (sodium) in the body (hyponatremia).  Ice chips.  Fruit juice that you have added water to (diluted fruit juice).  Low-calorie sports drinks.  Avoid:  Alcohol.  Drinks that contain a lot of sugar. These include high-calorie sports drinks, fruit juice that is not diluted, and soda.  Caffeine.  Foods that are greasy or contain a lot of fat or sugar.  Take over-the-counter and prescription medicines only as told by your health care provider.  Do not take sodium tablets. This can lead to having too much sodium in the body (hypernatremia).  Eat foods that contain a healthy balance of electrolytes, such as bananas, oranges, potatoes, tomatoes, and spinach.  Keep all follow-up visits as told by your health care provider. This is important. Contact a health care provider if:  You have abdominal pain that:  Gets worse.  Stays in one area (localizes).  You have a rash.  You have a stiff neck.  You are more irritable than usual.  You are sleepier or more difficult to wake up than usual.  You feel weak or dizzy.  You feel very thirsty.  You have urinated only a small amount of very dark urine over 6-8 hours. Get help right away if:  You have symptoms of severe dehydration.  You cannot drink fluids without vomiting.  Your symptoms get worse with treatment.  You have a fever.  You have a severe headache.  You have vomiting or diarrhea that:  Gets worse.  Does not go away.  You have blood or green matter  (bile) in your vomit.  You have blood in your stool. This may cause stool to look black and tarry.  You have not urinated in 6-8 hours.  You faint.  Your heart rate while sitting still is over 100 beats a minute.  You have trouble breathing. This information is not intended to replace advice given to you by your health care provider. Make sure you discuss any questions you have with your health care provider. Document Released: 08/28/2005 Document Revised: 03/24/2016 Document Reviewed: 10/22/2015 Elsevier Interactive Patient Education  2017 [employer]  

## 2017-01-10 ENCOUNTER — Other Ambulatory Visit: Payer: Self-pay | Admitting: *Deleted

## 2017-01-10 ENCOUNTER — Ambulatory Visit (HOSPITAL_BASED_OUTPATIENT_CLINIC_OR_DEPARTMENT_OTHER): Payer: 59 | Admitting: Oncology

## 2017-01-10 ENCOUNTER — Ambulatory Visit: Payer: 59

## 2017-01-10 ENCOUNTER — Ambulatory Visit (HOSPITAL_BASED_OUTPATIENT_CLINIC_OR_DEPARTMENT_OTHER): Payer: 59

## 2017-01-10 ENCOUNTER — Other Ambulatory Visit (HOSPITAL_BASED_OUTPATIENT_CLINIC_OR_DEPARTMENT_OTHER): Payer: 59

## 2017-01-10 ENCOUNTER — Ambulatory Visit: Payer: 59 | Admitting: Nutrition

## 2017-01-10 VITALS — BP 122/84 | HR 73 | Temp 98.9°F | Resp 18 | Wt 128.5 lb

## 2017-01-10 DIAGNOSIS — Z5111 Encounter for antineoplastic chemotherapy: Secondary | ICD-10-CM

## 2017-01-10 DIAGNOSIS — M899 Disorder of bone, unspecified: Secondary | ICD-10-CM

## 2017-01-10 DIAGNOSIS — C801 Malignant (primary) neoplasm, unspecified: Secondary | ICD-10-CM

## 2017-01-10 DIAGNOSIS — M25552 Pain in left hip: Secondary | ICD-10-CM

## 2017-01-10 DIAGNOSIS — Z8672 Personal history of thrombophlebitis: Secondary | ICD-10-CM

## 2017-01-10 DIAGNOSIS — C787 Secondary malignant neoplasm of liver and intrahepatic bile duct: Secondary | ICD-10-CM

## 2017-01-10 DIAGNOSIS — Z95828 Presence of other vascular implants and grafts: Secondary | ICD-10-CM

## 2017-01-10 DIAGNOSIS — C786 Secondary malignant neoplasm of retroperitoneum and peritoneum: Secondary | ICD-10-CM

## 2017-01-10 DIAGNOSIS — R05 Cough: Secondary | ICD-10-CM

## 2017-01-10 DIAGNOSIS — C180 Malignant neoplasm of cecum: Secondary | ICD-10-CM

## 2017-01-10 DIAGNOSIS — K117 Disturbances of salivary secretion: Secondary | ICD-10-CM

## 2017-01-10 DIAGNOSIS — G620 Drug-induced polyneuropathy: Secondary | ICD-10-CM

## 2017-01-10 DIAGNOSIS — C7802 Secondary malignant neoplasm of left lung: Secondary | ICD-10-CM

## 2017-01-10 DIAGNOSIS — Z5112 Encounter for antineoplastic immunotherapy: Secondary | ICD-10-CM

## 2017-01-10 DIAGNOSIS — Z800 Family history of malignant neoplasm of digestive organs: Secondary | ICD-10-CM

## 2017-01-10 DIAGNOSIS — C7801 Secondary malignant neoplasm of right lung: Secondary | ICD-10-CM | POA: Diagnosis not present

## 2017-01-10 DIAGNOSIS — C189 Malignant neoplasm of colon, unspecified: Secondary | ICD-10-CM

## 2017-01-10 DIAGNOSIS — R079 Chest pain, unspecified: Secondary | ICD-10-CM

## 2017-01-10 LAB — COMPREHENSIVE METABOLIC PANEL
ALBUMIN: 3.2 g/dL — AB (ref 3.5–5.0)
ALK PHOS: 228 U/L — AB (ref 40–150)
ALT: 10 U/L (ref 0–55)
AST: 27 U/L (ref 5–34)
Anion Gap: 12 mEq/L — ABNORMAL HIGH (ref 3–11)
BUN: 9.8 mg/dL (ref 7.0–26.0)
CALCIUM: 10.2 mg/dL (ref 8.4–10.4)
CO2: 25 mEq/L (ref 22–29)
CREATININE: 0.7 mg/dL (ref 0.6–1.1)
Chloride: 97 mEq/L — ABNORMAL LOW (ref 98–109)
EGFR: >90 mL/min/{1.73_m2} (ref >90)
Glucose: 83 mg/dl (ref 70–140)
Potassium: 4.3 mEq/L (ref 3.5–5.1)
Sodium: 134 mEq/L — ABNORMAL LOW (ref 136–145)
TOTAL PROTEIN: 7 g/dL (ref 6.4–8.3)
Total Bilirubin: 0.42 mg/dL (ref 0.20–1.20)

## 2017-01-10 LAB — CBC WITH DIFFERENTIAL/PLATELET
BASO%: 0.2 % (ref 0.0–2.0)
BASOS ABS: 0 10*3/uL (ref 0.0–0.1)
EOS%: 1.7 % (ref 0.0–7.0)
Eosinophils Absolute: 0.2 10*3/uL (ref 0.0–0.5)
HEMATOCRIT: 33.2 % — AB (ref 34.8–46.6)
HGB: 10.9 g/dL — ABNORMAL LOW (ref 11.6–15.9)
LYMPH#: 1.1 10*3/uL (ref 0.9–3.3)
LYMPH%: 8.6 % — ABNORMAL LOW (ref 14.0–49.7)
MCH: 28 pg (ref 25.1–34.0)
MCHC: 33 g/dL (ref 31.5–36.0)
MCV: 85.1 fL (ref 79.5–101.0)
MONO#: 0.9 10*3/uL (ref 0.1–0.9)
MONO%: 7.2 % (ref 0.0–14.0)
NEUT%: 82.3 % — ABNORMAL HIGH (ref 38.4–76.8)
NEUTROS ABS: 10.4 10*3/uL — AB (ref 1.5–6.5)
Platelets: 289 10*3/uL (ref 145–400)
RBC: 3.9 10*6/uL (ref 3.70–5.45)
RDW: 18.7 % — AB (ref 11.2–14.5)
WBC: 12.6 10*3/uL — ABNORMAL HIGH (ref 3.9–10.3)

## 2017-01-10 LAB — CEA (IN HOUSE-CHCC): CEA (CHCC-In House): 117.85 ng/mL — ABNORMAL HIGH (ref 0.00–5.00)

## 2017-01-10 MED ORDER — BEVACIZUMAB CHEMO INJECTION 400 MG/16ML
4.8000 mg/kg | Freq: Once | INTRAVENOUS | Status: AC
  Administered 2017-01-10: 300 mg via INTRAVENOUS
  Filled 2017-01-10: qty 12

## 2017-01-10 MED ORDER — FLUOROURACIL CHEMO INJECTION 2.5 GM/50ML
300.0000 mg/m2 | Freq: Once | INTRAVENOUS | Status: AC
  Administered 2017-01-10: 550 mg via INTRAVENOUS
  Filled 2017-01-10: qty 11

## 2017-01-10 MED ORDER — SODIUM CHLORIDE 0.9 % IV SOLN
Freq: Once | INTRAVENOUS | Status: AC
  Administered 2017-01-10: 12:00:00 via INTRAVENOUS

## 2017-01-10 MED ORDER — ATROPINE SULFATE 1 MG/ML IJ SOLN
0.5000 mg | Freq: Once | INTRAMUSCULAR | Status: AC | PRN
  Administered 2017-01-10: 0.5 mg via INTRAVENOUS

## 2017-01-10 MED ORDER — DIPHENHYDRAMINE HCL 50 MG/ML IJ SOLN
INTRAMUSCULAR | Status: AC
  Filled 2017-01-10: qty 1

## 2017-01-10 MED ORDER — PALONOSETRON HCL INJECTION 0.25 MG/5ML
INTRAVENOUS | Status: AC
  Filled 2017-01-10: qty 5

## 2017-01-10 MED ORDER — SODIUM CHLORIDE 0.9 % IV SOLN
1800.0000 mg/m2 | INTRAVENOUS | Status: DC
  Administered 2017-01-10: 3150 mg via INTRAVENOUS
  Filled 2017-01-10: qty 63

## 2017-01-10 MED ORDER — SODIUM CHLORIDE 0.9% FLUSH
10.0000 mL | INTRAVENOUS | Status: DC | PRN
  Filled 2017-01-10: qty 10

## 2017-01-10 MED ORDER — SODIUM CHLORIDE 0.9 % IJ SOLN
10.0000 mL | INTRAMUSCULAR | Status: AC | PRN
Start: 2017-01-10 — End: 2017-01-10
  Administered 2017-01-10: 10 mL
  Filled 2017-01-10: qty 10

## 2017-01-10 MED ORDER — HEPARIN SOD (PORK) LOCK FLUSH 100 UNIT/ML IV SOLN
500.0000 [IU] | INTRAVENOUS | Status: DC | PRN
  Filled 2017-01-10: qty 5

## 2017-01-10 MED ORDER — LEUCOVORIN CALCIUM INJECTION 350 MG
300.0000 mg/m2 | Freq: Once | INTRAVENOUS | Status: AC
  Administered 2017-01-10: 526 mg via INTRAVENOUS
  Filled 2017-01-10: qty 26.3

## 2017-01-10 MED ORDER — DEXTROSE 5 % IV SOLN
170.0000 mg/m2 | Freq: Once | INTRAVENOUS | Status: AC
  Administered 2017-01-10: 300 mg via INTRAVENOUS
  Filled 2017-01-10: qty 15

## 2017-01-10 MED ORDER — FAMOTIDINE IN NACL 20-0.9 MG/50ML-% IV SOLN
INTRAVENOUS | Status: AC
  Filled 2017-01-10: qty 50

## 2017-01-10 MED ORDER — FAMOTIDINE IN NACL 20-0.9 MG/50ML-% IV SOLN
20.0000 mg | Freq: Two times a day (BID) | INTRAVENOUS | Status: DC
  Administered 2017-01-10: 20 mg via INTRAVENOUS

## 2017-01-10 MED ORDER — DIPHENHYDRAMINE HCL 50 MG/ML IJ SOLN
25.0000 mg | Freq: Once | INTRAMUSCULAR | Status: AC
  Administered 2017-01-10: 25 mg via INTRAVENOUS

## 2017-01-10 MED ORDER — PALONOSETRON HCL INJECTION 0.25 MG/5ML
0.2500 mg | Freq: Once | INTRAVENOUS | Status: AC
  Administered 2017-01-10: 0.25 mg via INTRAVENOUS

## 2017-01-10 MED ORDER — FOSAPREPITANT DIMEGLUMINE INJECTION 150 MG
Freq: Once | INTRAVENOUS | Status: AC
  Administered 2017-01-10: 12:00:00 via INTRAVENOUS
  Filled 2017-01-10: qty 5

## 2017-01-10 MED ORDER — ATROPINE SULFATE 1 MG/ML IJ SOLN
INTRAMUSCULAR | Status: AC
  Filled 2017-01-10: qty 1

## 2017-01-10 NOTE — Progress Notes (Signed)
Attempted nutrition follow-up with patient receiving chemotherapy for stage IV colon cancer. Patient was sleeping soundly did not awaken to her name called. Spoke briefly with her son who reports patient has an increased appetite has been eating more lately. Current weight of 128.5 pounds documented on May 2 is somewhat stable from 129 pounds March 30. Son reports patient does not consume oral nutrition supplements and feels she is getting enough food that she does not require them. Provided my contact information and encouraged them to contact me if they have questions or concerns.  **Disclaimer: This note was dictated with voice recognition software. Similar sounding words can inadvertently be transcribed and this note may contain transcription errors which may not have been corrected upon publication of note.**

## 2017-01-10 NOTE — Progress Notes (Signed)
[facility] OFFICE PROGRESS NOTE   Diagnosis: Colon cancer  INTERVAL HISTORY:   Martha Becker completed another cycle of chemotherapy on 12/20/2016. She received Neulasta on 12/22/2016. She reports tolerating the chemotherapy without significant nausea or diarrhea. She had mouth sores that have resolved. She has intermittent pain in the upper chest and left hip/leg. The pain is partially relieved with ibuprofen. She would like a stronger pain medication. She has developed increased production of saliva. A scopolamine patch did not help. No reflux symptoms. No dysphagia. She continues to have an intermittent cough, but this has improved. She has noted "brown "areas near the eyes.  Objective:  Vital signs in last 24 hours:  Blood pressure 122/84, pulse 73, temperature 98.9 F (37.2 C), temperature source Oral, resp. rate 18, weight 128 lb 8 oz (58.3 kg), SpO2 99 %.    HEENT: No thrush or ulcers Resp: Scattered high-pitched inspiratory wheeze at the posterior chest, good air movement bilaterally, no respiratory distress Cardio: Regular rate and rhythm GI: No hepatosplenomegaly, nontender Vascular: No leg edema  Skin: Mild hyperpigmentation of the hands and face   Portacath/PICC-without erythema  Lab Results:  Lab Results  Component Value Date   WBC 12.6 (H) 01/10/2017   HGB 10.9 (L) 01/10/2017   HCT 33.2 (L) 01/10/2017   MCV 85.1 01/10/2017   PLT 289 01/10/2017   NEUTROABS 10.4 (H) 01/10/2017    CMP     Component Value Date/Time   NA 134 (L) 01/10/2017 0930   K 4.3 01/10/2017 0930   CL 97 (L) 10/25/2016 0500   CO2 25 01/10/2017 0930   GLUCOSE 83 01/10/2017 0930   BUN 9.8 01/10/2017 0930   CREATININE 0.7 01/10/2017 0930   CALCIUM 10.2 01/10/2017 0930   PROT 7.0 01/10/2017 0930   ALBUMIN 3.2 (L) 01/10/2017 0930   AST 27 01/10/2017 0930   ALT 10 01/10/2017 0930   ALKPHOS 228 (H) 01/10/2017 0930   BILITOT 0.42 01/10/2017 0930   GFRNONAA >60 10/25/2016  0500   GFRAA >60 10/25/2016 0500    Lab Results  Component Value Date   CEA1 117.85 (H) 01/10/2017    Lab Results  Component Value Date   INR 1.08 08/28/2014    Imaging:  No results found.  Medications: I have reviewed the patient's current medications.  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 no loss of mismatch repair protein expression, microsatellite stable ? 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 APC alteration detected ? Staging CT scans December 2015 and PET scan 09/22/2014 consistent with metastatic bilateral lung nodules, metastatic retroperitoneal lymphadenopathy, and a probable left liver metastasis ? Cycle 1 CAPOX 10/29/2014 (Xeloda discontinued day 9 secondary to diarrhea)  ? CT 11/14/2014 revealed progressive metastatic disease at the lung bases and liver compared to a PET scan from 09/22/2014, mild improvement in retroperitoneal lymphadenopathy ? Cycle 1 FOLFOX 12/02/2014 ? Cycle 2 FOLFOX, Avastin added 12/16/2014 ? Cycle 3 FOLFOX/Avastin 12/30/2014 ? Cycle 4 FOLFOX/Avastin 01/13/2015 ? Cycle 5 FOLFOX/Avastin 01/27/2015  CT 02/10/2015 with improvement in hepatic metastases, mild improvement of lung metastases and retroperitoneal lymphadenopathy  Cycle 6 FOLFOX/Avastin 02/16/2015  Cycle 7 FOLFOX/Avastin 03/02/2015  Cycle 8 FOLFOX/Avastin 03/16/2015  Cycle 9 FOLFOX/Avastin 04/13/2015  Restaging CTs 04/20/2015 with a decrease in the size of pulmonary nodules, liver lesions, and retroperitoneal lymph nodes  Restaging CTs 07/27/2015 revealed enlargement of bilateral lung nodules, stable  liver lesions, decreased periaortic adenopathy  CTs 10/05/2015 revealed further enlargement of bilateral lung nodules ,enlargement of liver lesions, and enlargement of peritoneal lymph nodes  Cycle 1  FOLFIRI/Avastin 10/12/2015  Cycle 2 FOLFIRI/Avastin 10/26/2015  Cycle 3 FOLFIRI/Avastin 11/09/2015  Cycle 4 FOLFIRI/Avastin 11/23/2015  Cycle 5 FOLFIRI/Avastin 12/28/2015  CTs 01/06/2016-decreased size of lung nodules , decreased / regression of liver lesions, regression of retroperitoneal nodes  Cycle 6 FOLFIRI/Avastin 01/11/2016 (Neulasta added)   Cycle 7 FOLFIRI/Avastin 02/01/2016 with Neulasta support  Cycle 8 FOLFIRI/Avastin 02/22/2016 with Neulasta support  Cycle 9 FOLFIRI/Avastin 03/13/2016 with Neulasta support  Cycle 10 FOLFIRI/Avastin 04/04/2016 with Neulasta support  Restaging CTs 04/17/2016-decreased size of lung and liver lesions  Patient decided on treatment break  Restaging CTs 08/22/2016-marked progression in the lungs and liver  Cycle 1 FOLFIRI/Avastin 09/25/2016  Cycle 2 FOLFIRI/Avastin 10/18/2016  Cycle 3 FOLFIRI/Avastin 11/08/2016  Restaging CTs 11/23/2016-stable lung metastases, progression of hepatic metastases, increased right paratracheal node, seventh posterior lateral right rib fracture, T6 lucency-new, new left sacral lesion  Cycle 4 FOLFIRI/Avastin 11/29/2016  Cycle 5 FOLFIRI/Avastin 12/20/2016  Cycle 6 FOLFIRI/Avastin 01/10/2017 (Neulasta held)  2. Elevated TSH, PET scan 09/22/2014 with diffuse increased metabolic uptake in the thyroid   3. Family history of multiple cancers including colon cancer in her paternal grandfather and rectal cancer in a paternal first cousin  55. Admission 11/14/2014 with nausea and diarrhea, most likely secondary to capecitabine induced enteritis-resolved. CT 11/14/2014 consistent with ileitis  5.Superficial phlebitis left hand/wrist 12/30/2014.  6. Early oxaliplatin neuropathy  7. Pain at the right gluteus and right leg-MRI of the lumbar spine 07/13/2015 confirmed a disc protrusion at L5-S1  8. Diaphoresis, rhinorrhea, shortness of breath and cough following cycle 1  FOLFIRI/Avastin, did not occur following cycle 2  9. Oral mucositis secondary to chemotherapy  10. Nonproductive coughsecondary to metastatic colon cancer  11. Neutropenia secondary to chemotherapy -Neulasta added with cycle 6 FOLFIRI 01/11/2016  12. Hospitalization 10/24/2016 through 10/25/2016 with hypotension related to dehydration, anemia. Hemoglobin improved 11/08/2016.  13. Hyponatremia. 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 Improved.  14.  Increased salivation-etiology unclear, she will try Protonix  15.  Intermittent pain-potentially related to Neulasta, Neulasta will be held with the cycle of chemotherapy given 01/10/2017    Disposition:  Her overall status appears unchanged. The plan is to complete another cycle of FOLFIRI/Avastin today. The etiology of the excessive salivation is unclear. She will try Protonix. We will hold Neulasta with this cycle as it could be contributing to the bone pain. We will prescribe oxycodone to use as needed for pain.  Martha Becker will undergo a restaging CT evaluation after this cycle. She will return for an office visit on 01/31/2017.  She will Volcy received IV fluids on day 3.  25 minutes were spent with the patient today. The majority of the time was used for counseling and coordination of care.  Betsy Coder, MD  01/10/2017  11:15 AM

## 2017-01-10 NOTE — Patient Instructions (Signed)
[facility] Discharge Instructions for Patients Receiving Chemotherapy  Today you received the following chemotherapy agents: Avastin, Irinotecan, Leucovorin and Adrucil   To help prevent nausea and vomiting after your treatment, we encourage you to take your nausea medication as directed.    If you develop nausea and vomiting that is not controlled by your nausea medication, call the clinic.   BELOW ARE SYMPTOMS THAT SHOULD BE REPORTED IMMEDIATELY:  *FEVER GREATER THAN 100.5 F  *CHILLS WITH OR WITHOUT FEVER  NAUSEA AND VOMITING THAT IS NOT CONTROLLED WITH YOUR NAUSEA MEDICATION  *UNUSUAL SHORTNESS OF BREATH  *UNUSUAL BRUISING OR BLEEDING  TENDERNESS IN MOUTH AND THROAT WITH OR WITHOUT PRESENCE OF ULCERS  *URINARY PROBLEMS  *BOWEL PROBLEMS  UNUSUAL RASH Items with * indicate a potential emergency and should be followed up as soon as possible.  Feel free to call the clinic you have any questions or concerns. The clinic phone number is [phone].  Please show the CHEMO ALERT CARD at check-in to the Emergency Department and triage nurse.   

## 2017-01-10 NOTE — Patient Instructions (Signed)
Implanted Port Home Guide An implanted port is a type of central line that is placed under the skin. Central lines are used to provide IV access when treatment or nutrition needs to be given through a person's veins. Implanted ports are used for long-term IV access. An implanted port may be placed because:  You need IV medicine that would be irritating to the small veins in your hands or arms.  You need long-term IV medicines, such as antibiotics.  You need IV nutrition for a long period.  You need frequent blood draws for lab tests.  You need dialysis.  Implanted ports are usually placed in the chest area, but they can also be placed in the upper arm, the abdomen, or the leg. An implanted port has two main parts:  Reservoir. The reservoir is round and will appear as a small, raised area under your skin. The reservoir is the part where a needle is inserted to give medicines or draw blood.  Catheter. The catheter is a thin, flexible tube that extends from the reservoir. The catheter is placed into a large vein. Medicine that is inserted into the reservoir goes into the catheter and then into the vein.  How will I care for my incision site? Do not get the incision site wet. Bathe or shower as directed by your health care provider. How is my port accessed? Special steps must be taken to access the port:  Before the port is accessed, a numbing cream can be placed on the skin. This helps numb the skin over the port site.  Your health care provider uses a sterile technique to access the port. ? Your health care provider must put on a mask and sterile gloves. ? The skin over your port is cleaned carefully with an antiseptic and allowed to dry. ? The port is gently pinched between sterile gloves, and a needle is inserted into the port.  Only "non-coring" port needles should be used to access the port. Once the port is accessed, a blood return should be checked. This helps ensure that the port  is in the vein and is not clogged.  If your port needs to remain accessed for a constant infusion, a clear (transparent) bandage will be placed over the needle site. The bandage and needle will need to be changed every week, or as directed by your health care provider.  Keep the bandage covering the needle clean and dry. Do not get it wet. Follow your health care provider's instructions on how to take a shower or bath while the port is accessed.  If your port does not need to stay accessed, no bandage is needed over the port.  What is flushing? Flushing helps keep the port from getting clogged. Follow your health care provider's instructions on how and when to flush the port. Ports are usually flushed with saline solution or a medicine called heparin. The need for flushing will depend on how the port is used.  If the port is used for intermittent medicines or blood draws, the port will need to be flushed: ? After medicines have been given. ? After blood has been drawn. ? As part of routine maintenance.  If a constant infusion is running, the port may not need to be flushed.  How long will my port stay implanted? The port can stay in for as long as your health care provider thinks it is needed. When it is time for the port to come out, surgery will be   done to remove it. The procedure is similar to the one performed when the port was put in. When should I seek immediate medical care? When you have an implanted port, you should seek immediate medical care if:  You notice a bad smell coming from the incision site.  You have swelling, redness, or drainage at the incision site.  You have more swelling or pain at the port site or the surrounding area.  You have a fever that is not controlled with medicine.  This information is not intended to replace advice given to you by your health care provider. Make sure you discuss any questions you have with your health care provider. Document  Released: 08/28/2005 Document Revised: 02/03/2016 Document Reviewed: 05/05/2013 Elsevier Interactive Patient Education  2017 [employer].  

## 2017-01-11 ENCOUNTER — Telehealth: Payer: Self-pay | Admitting: *Deleted

## 2017-01-11 MED ORDER — PANTOPRAZOLE SODIUM 40 MG PO TBEC: 40.0000 mg | DELAYED_RELEASE_TABLET | Freq: Every day | ORAL | 0 refills | Status: AC

## 2017-01-11 MED ORDER — OXYCODONE HCL 5 MG PO TABS: 5.0000 mg | ORAL_TABLET | ORAL | 0 refills | Status: DC | PRN

## 2017-01-11 NOTE — Telephone Encounter (Signed)
Message from pt requesting medications discussed during office visit to be called in to pharmacy. Reviewed with Dr. Benay Spice: Protonix e-scribed. Instructed pt to continue Tramadol until she can pick up narcotic script for pain. She voiced understanding.

## 2017-01-12 ENCOUNTER — Ambulatory Visit (HOSPITAL_BASED_OUTPATIENT_CLINIC_OR_DEPARTMENT_OTHER): Payer: 59

## 2017-01-12 ENCOUNTER — Ambulatory Visit: Payer: 59

## 2017-01-12 DIAGNOSIS — C787 Secondary malignant neoplasm of liver and intrahepatic bile duct: Secondary | ICD-10-CM | POA: Diagnosis not present

## 2017-01-12 DIAGNOSIS — C180 Malignant neoplasm of cecum: Secondary | ICD-10-CM | POA: Diagnosis not present

## 2017-01-12 MED ORDER — SODIUM CHLORIDE 0.9% FLUSH
10.0000 mL | Freq: Once | INTRAVENOUS | Status: AC
  Administered 2017-01-12: 10 mL via INTRAVENOUS
  Filled 2017-01-12: qty 10

## 2017-01-12 MED ORDER — HEPARIN SOD (PORK) LOCK FLUSH 100 UNIT/ML IV SOLN
500.0000 [IU] | Freq: Once | INTRAVENOUS | Status: AC
  Administered 2017-01-12: 500 [IU] via INTRAVENOUS
  Filled 2017-01-12: qty 5

## 2017-01-12 MED ORDER — SODIUM CHLORIDE 0.9 % IV SOLN
INTRAVENOUS | Status: AC
  Administered 2017-01-12: 13:00:00 via INTRAVENOUS

## 2017-01-12 NOTE — Progress Notes (Deleted)
Per Jan, RN pump was D/C during [facility] visit

## 2017-01-12 NOTE — Patient Instructions (Signed)
Dehydration, Adult Dehydration is a condition in which there is not enough fluid or water in the body. This happens when you lose more fluids than you take in. Important organs, such as the kidneys, brain, and heart, cannot function without a proper amount of fluids. Any loss of fluids from the body can lead to dehydration. Dehydration can range from mild to severe. This condition should be treated right away to prevent it from becoming severe. What are the causes? This condition may be caused by:  Vomiting.  Diarrhea.  Excessive sweating, such as from heat exposure or exercise.  Not drinking enough fluid, especially:  When ill.  While doing activity that requires a lot of energy.  Excessive urination.  Fever.  Infection.  Certain medicines, such as medicines that cause the body to lose excess fluid (diuretics).  Inability to access safe drinking water.  Reduced physical ability to get adequate water and food. What increases the risk? This condition is more likely to develop in people:  Who have a poorly controlled long-term (chronic) illness, such as diabetes, heart disease, or kidney disease.  Who are age 65 or older.  Who are disabled.  Who live in a place with high altitude.  Who play endurance sports. What are the signs or symptoms? Symptoms of mild dehydration may include:   Thirst.  Dry lips.  Slightly dry mouth.  Dry, warm skin.  Dizziness. Symptoms of moderate dehydration may include:   Very dry mouth.  Muscle cramps.  Dark urine. Urine may be the color of tea.  Decreased urine production.  Decreased tear production.  Heartbeat that is irregular or faster than normal (palpitations).  Headache.  Light-headedness, especially when you stand up from a sitting position.  Fainting (syncope). Symptoms of severe dehydration may include:   Changes in skin, such as:  Cold and clammy skin.  Blotchy (mottled) or pale skin.  Skin that does  not quickly return to normal after being lightly pinched and released (poor skin turgor).  Changes in body fluids, such as:  Extreme thirst.  No tear production.  Inability to sweat when body temperature is high, such as in hot weather.  Very little urine production.  Changes in vital signs, such as:  Weak pulse.  Pulse that is more than 100 beats a minute when sitting still.  Rapid breathing.  Low blood pressure.  Other changes, such as:  Sunken eyes.  Cold hands and feet.  Confusion.  Lack of energy (lethargy).  Difficulty waking up from sleep.  Short-term weight loss.  Unconsciousness. How is this diagnosed? This condition is diagnosed based on your symptoms and a physical exam. Blood and urine tests may be done to help confirm the diagnosis. How is this treated? Treatment for this condition depends on the severity. Mild or moderate dehydration can often be treated at home. Treatment should be started right away. Do not wait until dehydration becomes severe. Severe dehydration is an emergency and it needs to be treated in a hospital. Treatment for mild dehydration may include:   Drinking more fluids.  Replacing salts and minerals in your blood (electrolytes) that you may have lost. Treatment for moderate dehydration may include:   Drinking an oral rehydration solution (ORS). This is a drink that helps you replace fluids and electrolytes (rehydrate). It can be found at pharmacies and retail stores. Treatment for severe dehydration may include:   Receiving fluids through an IV tube.  Receiving an electrolyte solution through a feeding tube that is   passed through your nose and into your stomach (nasogastric tube, or NG tube).  Correcting any abnormalities in electrolytes.  Treating the underlying cause of dehydration. Follow these instructions at home:  If directed by your health care provider, drink an ORS:  Make an ORS by following instructions on the  package.  Start by drinking small amounts, about  cup (120 mL) every 5-10 minutes.  Slowly increase how much you drink until you have taken the amount recommended by your health care provider.  Drink enough clear fluid to keep your urine clear or pale yellow. If you were told to drink an ORS, finish the ORS first, then start slowly drinking other clear fluids. Drink fluids such as:  Water. Do not drink only water. Doing that can lead to having too little salt (sodium) in the body (hyponatremia).  Ice chips.  Fruit juice that you have added water to (diluted fruit juice).  Low-calorie sports drinks.  Avoid:  Alcohol.  Drinks that contain a lot of sugar. These include high-calorie sports drinks, fruit juice that is not diluted, and soda.  Caffeine.  Foods that are greasy or contain a lot of fat or sugar.  Take over-the-counter and prescription medicines only as told by your health care provider.  Do not take sodium tablets. This can lead to having too much sodium in the body (hypernatremia).  Eat foods that contain a healthy balance of electrolytes, such as bananas, oranges, potatoes, tomatoes, and spinach.  Keep all follow-up visits as told by your health care provider. This is important. Contact a health care provider if:  You have abdominal pain that:  Gets worse.  Stays in one area (localizes).  You have a rash.  You have a stiff neck.  You are more irritable than usual.  You are sleepier or more difficult to wake up than usual.  You feel weak or dizzy.  You feel very thirsty.  You have urinated only a small amount of very dark urine over 6-8 hours. Get help right away if:  You have symptoms of severe dehydration.  You cannot drink fluids without vomiting.  Your symptoms get worse with treatment.  You have a fever.  You have a severe headache.  You have vomiting or diarrhea that:  Gets worse.  Does not go away.  You have blood or green matter  (bile) in your vomit.  You have blood in your stool. This may cause stool to look black and tarry.  You have not urinated in 6-8 hours.  You faint.  Your heart rate while sitting still is over 100 beats a minute.  You have trouble breathing. This information is not intended to replace advice given to you by your health care provider. Make sure you discuss any questions you have with your health care provider. Document Released: 08/28/2005 Document Revised: 03/24/2016 Document Reviewed: 10/22/2015 Elsevier Interactive Patient Education  2017 [employer]  

## 2017-01-12 NOTE — Progress Notes (Signed)
Pt's main concern is her copious amounts or oral secretions. Scopalamine patch has not been effective. Ned Card, NP and Dr. Benay Spice aware. Pt given her prescription for oxycodone.

## 2017-01-15 ENCOUNTER — Telehealth: Payer: Self-pay | Admitting: *Deleted

## 2017-01-15 NOTE — Telephone Encounter (Signed)
"  I was there Friday for pump D/C and IVF.  The nurse told me about an eye drop that is good for increased saliva.  The drop goes under the tongue instead of the eyes.  I took the Protonix on May 3 rd and it did not work.  I understood if it was going to work, it would have worked by Friday.  I also placed the scopolamine patch last night but it's not working either.  I need something .  Return number [phone]."

## 2017-01-17 NOTE — Telephone Encounter (Signed)
Returned call to pt, Dr. Benay Spice does not recommend atropine eyedrops. There is no supporting evidence to use this outside of terminal hospice pt. Instructed her to continue Protonix and Scopolamine patch. Try using mouthwash if no ulcerations to see if this helps. Pt voiced understanding.

## 2017-01-18 ENCOUNTER — Telehealth: Payer: Self-pay | Admitting: Oncology

## 2017-01-18 NOTE — Telephone Encounter (Signed)
sw pt to inform of 5/24 appts. Inform pt that 5/23 was not available in the infusion. Pt will call back toconfirm appts

## 2017-01-22 ENCOUNTER — Other Ambulatory Visit: Payer: Self-pay | Admitting: Nurse Practitioner

## 2017-01-22 ENCOUNTER — Other Ambulatory Visit: Payer: Self-pay | Admitting: Oncology

## 2017-01-22 DIAGNOSIS — C7800 Secondary malignant neoplasm of unspecified lung: Secondary | ICD-10-CM

## 2017-01-22 DIAGNOSIS — C801 Malignant (primary) neoplasm, unspecified: Secondary | ICD-10-CM

## 2017-01-22 DIAGNOSIS — C786 Secondary malignant neoplasm of retroperitoneum and peritoneum: Secondary | ICD-10-CM

## 2017-01-22 DIAGNOSIS — C180 Malignant neoplasm of cecum: Secondary | ICD-10-CM

## 2017-01-24 ENCOUNTER — Other Ambulatory Visit: Payer: Self-pay | Admitting: *Deleted

## 2017-01-24 DIAGNOSIS — C786 Secondary malignant neoplasm of retroperitoneum and peritoneum: Secondary | ICD-10-CM

## 2017-01-24 DIAGNOSIS — C787 Secondary malignant neoplasm of liver and intrahepatic bile duct: Secondary | ICD-10-CM

## 2017-01-24 DIAGNOSIS — C801 Malignant (primary) neoplasm, unspecified: Secondary | ICD-10-CM

## 2017-01-24 DIAGNOSIS — C189 Malignant neoplasm of colon, unspecified: Secondary | ICD-10-CM

## 2017-01-24 DIAGNOSIS — C180 Malignant neoplasm of cecum: Secondary | ICD-10-CM

## 2017-01-24 MED ORDER — SCOPOLAMINE 1 MG/3DAYS TD PT72: 1.0000 | MEDICATED_PATCH | TRANSDERMAL | 0 refills | Status: DC

## 2017-01-25 ENCOUNTER — Telehealth: Payer: Self-pay

## 2017-01-25 ENCOUNTER — Other Ambulatory Visit: Payer: Self-pay | Admitting: *Deleted

## 2017-01-25 ENCOUNTER — Encounter (HOSPITAL_COMMUNITY): Payer: Self-pay

## 2017-01-25 ENCOUNTER — Ambulatory Visit (HOSPITAL_COMMUNITY)
Admission: RE | Admit: 2017-01-25 | Discharge: 2017-01-25 | Disposition: A | Discharge status: Home / Self Care | Payer: 59 | Source: Ambulatory Visit | Attending: Oncology | Admitting: Oncology

## 2017-01-25 DIAGNOSIS — C7951 Secondary malignant neoplasm of bone: Secondary | ICD-10-CM | POA: Diagnosis not present

## 2017-01-25 DIAGNOSIS — C7800 Secondary malignant neoplasm of unspecified lung: Secondary | ICD-10-CM | POA: Insufficient documentation

## 2017-01-25 DIAGNOSIS — Z9049 Acquired absence of other specified parts of digestive tract: Secondary | ICD-10-CM | POA: Diagnosis not present

## 2017-01-25 DIAGNOSIS — C801 Malignant (primary) neoplasm, unspecified: Secondary | ICD-10-CM

## 2017-01-25 DIAGNOSIS — C787 Secondary malignant neoplasm of liver and intrahepatic bile duct: Secondary | ICD-10-CM | POA: Insufficient documentation

## 2017-01-25 DIAGNOSIS — C786 Secondary malignant neoplasm of retroperitoneum and peritoneum: Secondary | ICD-10-CM

## 2017-01-25 DIAGNOSIS — R591 Generalized enlarged lymph nodes: Secondary | ICD-10-CM | POA: Insufficient documentation

## 2017-01-25 DIAGNOSIS — C180 Malignant neoplasm of cecum: Secondary | ICD-10-CM | POA: Diagnosis not present

## 2017-01-25 MED ORDER — IOPAMIDOL (ISOVUE-300) INJECTION 61%
100.0000 mL | Freq: Once | INTRAVENOUS | Status: AC | PRN
Start: 2017-01-25 — End: 2017-01-25
  Administered 2017-01-25: 100 mL via INTRAVENOUS

## 2017-01-25 MED ORDER — HEPARIN SOD (PORK) LOCK FLUSH 100 UNIT/ML IV SOLN
INTRAVENOUS | Status: AC
  Administered 2017-01-25: 500 [IU]
  Filled 2017-01-25: qty 5

## 2017-01-25 MED ORDER — TRAMADOL HCL 50 MG PO TABS: 25.0000 mg | ORAL_TABLET | Freq: Four times a day (QID) | ORAL | 0 refills | Status: DC | PRN

## 2017-01-25 MED ORDER — IOPAMIDOL (ISOVUE-300) INJECTION 61%
INTRAVENOUS | Status: AC
  Filled 2017-01-25: qty 30

## 2017-01-25 NOTE — Telephone Encounter (Signed)
Pt is calling for a tramadol refill. She uses 1 at night to help her relax and take away any little aches so she can get a better sleep. She uses her oxycodone only when she gets nerve pain acting up. She has not used any other types of sleeping medicines.

## 2017-01-26 ENCOUNTER — Telehealth: Payer: Self-pay | Admitting: *Deleted

## 2017-01-26 ENCOUNTER — Telehealth: Payer: Self-pay | Admitting: Oncology

## 2017-01-26 NOTE — Telephone Encounter (Signed)
-----   Message from Ladell Pier, MD sent at 01/25/2017  9:29 PM EDT ----- Please call patient, lung and liver lesions are stable or slightly smaller, bone lesions more visible - may indicated healing, f/u as scheduled

## 2017-01-26 NOTE — Telephone Encounter (Signed)
Lab, flush, follow up and Chemo rescheduled from 05/24/ to 05/26, per schd msg.  Left message on patient's voicemail.

## 2017-01-26 NOTE — Telephone Encounter (Signed)
Called pt with CT result, per Dr. Gearldine Shown note below. She voiced understanding, reports she is going out of town next week and needs to reschedule 5/24 visits. Pt would rather move it out 2 weeks if Dr. Benay Spice is OK with that.  Message to MD.

## 2017-01-27 ENCOUNTER — Other Ambulatory Visit: Payer: Self-pay | Admitting: Oncology

## 2017-02-01 ENCOUNTER — Ambulatory Visit: Payer: 59 | Admitting: Oncology

## 2017-02-01 ENCOUNTER — Ambulatory Visit: Payer: 59

## 2017-02-01 ENCOUNTER — Telehealth: Payer: Self-pay

## 2017-02-01 ENCOUNTER — Other Ambulatory Visit: Payer: 59

## 2017-02-01 NOTE — Telephone Encounter (Signed)
Chanetta called b/c traveling to the beach yesterday she  had n/v for about 3 hours. Today is resting and taking it easy and recuperating. Was able to start eating small amounts last night.  Is now eating toast and drinking G2 and water. Instructed her to drink at least 60 oz and eat lightly today. If she does not get better or worsens to go to ED.  Instructed her to get dramamine for the trip home

## 2017-02-02 ENCOUNTER — Telehealth: Payer: Self-pay | Admitting: Medical Oncology

## 2017-02-02 ENCOUNTER — Ambulatory Visit: Payer: 59

## 2017-02-02 NOTE — Telephone Encounter (Signed)
She is at beach doing much better today . She cancelled IVF for tomorrow.

## 2017-02-03 ENCOUNTER — Ambulatory Visit: Payer: 59

## 2017-02-06 ENCOUNTER — Telehealth: Payer: Self-pay

## 2017-02-06 NOTE — Telephone Encounter (Signed)
  On Saturday morning she was in so much pain "she could scream". She was rocking and crying at times.  It was in left leg and hip radiating down leg and thigh. Sharp.  Tramadol did not help. oxycodone finally helped after an hour, and put her to sleep. When woke up next morning it started hurting again. Came home from beach Monday evening, rode with seat reclined back. Last night she used oxycodone at 630 pm and needed to take again at 430 am to continue to sleep. She will call tomorrow with update.  Next appt is 6/6

## 2017-02-08 NOTE — Telephone Encounter (Signed)
Called pt to check on her & she reports that she is alright but can't use L leg well without hurting.  She has been out of bed but she can't do much.  Pain is a little better, "not as bad as it was"  She has taken tramadol-2 last hs & is trying to stay away from the [place].  She reports bowels & bladder are moving OK but was a little constipated & took senna/  She knows to call us if things are worse.  She asked what Dr Benay Spice thinks of this.  Informed that we would make sure he knows how she is doing.

## 2017-02-11 ENCOUNTER — Other Ambulatory Visit: Payer: Self-pay | Admitting: Oncology

## 2017-02-12 ENCOUNTER — Telehealth: Payer: Self-pay | Admitting: Nurse Practitioner

## 2017-02-12 ENCOUNTER — Telehealth: Payer: Self-pay

## 2017-02-12 ENCOUNTER — Other Ambulatory Visit (HOSPITAL_BASED_OUTPATIENT_CLINIC_OR_DEPARTMENT_OTHER): Payer: 59

## 2017-02-12 ENCOUNTER — Telehealth: Payer: Self-pay | Admitting: *Deleted

## 2017-02-12 ENCOUNTER — Ambulatory Visit (HOSPITAL_BASED_OUTPATIENT_CLINIC_OR_DEPARTMENT_OTHER): Payer: 59 | Admitting: Nurse Practitioner

## 2017-02-12 VITALS — BP 119/88 | HR 102 | Temp 98.6°F | Resp 20

## 2017-02-12 DIAGNOSIS — R05 Cough: Secondary | ICD-10-CM

## 2017-02-12 DIAGNOSIS — C787 Secondary malignant neoplasm of liver and intrahepatic bile duct: Secondary | ICD-10-CM | POA: Diagnosis not present

## 2017-02-12 DIAGNOSIS — R112 Nausea with vomiting, unspecified: Secondary | ICD-10-CM | POA: Diagnosis not present

## 2017-02-12 DIAGNOSIS — C7951 Secondary malignant neoplasm of bone: Secondary | ICD-10-CM

## 2017-02-12 DIAGNOSIS — M25552 Pain in left hip: Secondary | ICD-10-CM

## 2017-02-12 DIAGNOSIS — K069 Disorder of gingiva and edentulous alveolar ridge, unspecified: Secondary | ICD-10-CM

## 2017-02-12 DIAGNOSIS — C7802 Secondary malignant neoplasm of left lung: Secondary | ICD-10-CM | POA: Diagnosis not present

## 2017-02-12 DIAGNOSIS — C801 Malignant (primary) neoplasm, unspecified: Secondary | ICD-10-CM

## 2017-02-12 DIAGNOSIS — C7801 Secondary malignant neoplasm of right lung: Secondary | ICD-10-CM | POA: Diagnosis not present

## 2017-02-12 DIAGNOSIS — R627 Adult failure to thrive: Secondary | ICD-10-CM

## 2017-02-12 DIAGNOSIS — C189 Malignant neoplasm of colon, unspecified: Secondary | ICD-10-CM

## 2017-02-12 DIAGNOSIS — C180 Malignant neoplasm of cecum: Secondary | ICD-10-CM

## 2017-02-12 DIAGNOSIS — M545 Low back pain: Secondary | ICD-10-CM

## 2017-02-12 DIAGNOSIS — C786 Secondary malignant neoplasm of retroperitoneum and peritoneum: Secondary | ICD-10-CM

## 2017-02-12 DIAGNOSIS — C7800 Secondary malignant neoplasm of unspecified lung: Secondary | ICD-10-CM

## 2017-02-12 LAB — COMPREHENSIVE METABOLIC PANEL
ALBUMIN: 3.2 g/dL — AB (ref 3.5–5.0)
ALT: 12 U/L (ref 0–55)
AST: 30 U/L (ref 5–34)
Alkaline Phosphatase: 239 U/L — ABNORMAL HIGH (ref 40–150)
Anion Gap: 14 mEq/L — ABNORMAL HIGH (ref 3–11)
BUN: 8.6 mg/dL (ref 7.0–26.0)
CHLORIDE: 88 meq/L — AB (ref 98–109)
CO2: 25 meq/L (ref 22–29)
Calcium: 10.6 mg/dL — ABNORMAL HIGH (ref 8.4–10.4)
Creatinine: 0.7 mg/dL (ref 0.6–1.1)
EGFR: >90 mL/min/{1.73_m2} (ref >90)
GLUCOSE: 82 mg/dL (ref 70–140)
POTASSIUM: 4.2 meq/L (ref 3.5–5.1)
SODIUM: 128 meq/L — AB (ref 136–145)
Total Bilirubin: 0.74 mg/dL (ref 0.20–1.20)
Total Protein: 7.2 g/dL (ref 6.4–8.3)

## 2017-02-12 LAB — CBC WITH DIFFERENTIAL/PLATELET
BASO%: 0.5 % (ref 0.0–2.0)
BASOS ABS: 0.1 10*3/uL (ref 0.0–0.1)
EOS%: 0.2 % (ref 0.0–7.0)
Eosinophils Absolute: 0 10*3/uL (ref 0.0–0.5)
HCT: 32.1 % — ABNORMAL LOW (ref 34.8–46.6)
HEMOGLOBIN: 10.7 g/dL — AB (ref 11.6–15.9)
LYMPH%: 4.7 % — ABNORMAL LOW (ref 14.0–49.7)
MCH: 27.3 pg (ref 25.1–34.0)
MCHC: 33.3 g/dL (ref 31.5–36.0)
MCV: 82 fL (ref 79.5–101.0)
MONO#: 0.8 10*3/uL (ref 0.1–0.9)
MONO%: 5.9 % (ref 0.0–14.0)
NEUT#: 11.3 10*3/uL — ABNORMAL HIGH (ref 1.5–6.5)
NEUT%: 88.7 % — ABNORMAL HIGH (ref 38.4–76.8)
Platelets: 353 10*3/uL (ref 145–400)
RBC: 3.91 10*6/uL (ref 3.70–5.45)
RDW: 17.4 % — ABNORMAL HIGH (ref 11.2–14.5)
WBC: 12.8 10*3/uL — AB (ref 3.9–10.3)
lymph#: 0.6 10*3/uL — ABNORMAL LOW (ref 0.9–3.3)

## 2017-02-12 LAB — CEA (IN HOUSE-CHCC): CEA (CHCC-In House): 184.36 ng/mL — ABNORMAL HIGH (ref 0.00–5.00)

## 2017-02-12 MED ORDER — HEPARIN SOD (PORK) LOCK FLUSH 100 UNIT/ML IV SOLN
500.0000 [IU] | Freq: Once | INTRAVENOUS | Status: AC
  Administered 2017-02-12: 500 [IU] via INTRAVENOUS
  Filled 2017-02-12: qty 5

## 2017-02-12 MED ORDER — SODIUM CHLORIDE 0.9 % IV SOLN
INTRAVENOUS | Status: DC
  Administered 2017-02-12: 14:00:00 via INTRAVENOUS

## 2017-02-12 MED ORDER — SODIUM CHLORIDE 0.9% FLUSH
10.0000 mL | INTRAVENOUS | Status: DC | PRN
  Administered 2017-02-12: 10 mL via INTRAVENOUS
  Filled 2017-02-12: qty 10

## 2017-02-12 MED ORDER — DOXYCYCLINE HYCLATE 100 MG PO TABS: 100.0000 mg | ORAL_TABLET | Freq: Two times a day (BID) | ORAL | 0 refills | Status: DC

## 2017-02-12 NOTE — Telephone Encounter (Signed)
Pt called with multiple complaints: Cannot get up without shaking, hurting under breast on left side, feels like she needs fluids, she is having pain and trying to stay off her leg. She is sleepy and goes to sleep after about 30 minutes up. Her low back seems like hurting when she coughs, coughing puts pressure on LB. It's worrying her.  As soon as she drinks she gets a lot of saliva. No vomiting, no diarrhea. Her R jaw is swollen into her cheek, her back tooth chipped, she feels she has a cavity on the upper, pt has left over antibiotic in the house she is using.  Is not sure she can wait until Wednesday. Next appt 6/6 lab/flush/Lisa/folfiri.   She has a ride if she needs to come in or go to ER this AM.

## 2017-02-12 NOTE — Progress Notes (Signed)
Telephone call to patient's regular dentist- Gerald Stabs, [place] [phone]. Appointment scheduled for Tuesday June 5 at 1:20pm. Appointment time written down and given to patient. Patient verbalized appointment time back to me.

## 2017-02-12 NOTE — Telephone Encounter (Signed)
Scheduled appt per sch message from [facility]

## 2017-02-12 NOTE — Progress Notes (Addendum)
[facility]artha [facility] OFFICE PROGRESS NOTE   Diagnosis:  Colon cancer  INTERVAL HISTORY:   Martha Becker returns prior to scheduled follow-up. She completed a cycle of FOLFIRI/Avastin 01/10/2017. She canceled her next follow-up visit to go out of town. On her way to the beach last week she developed nausea/vomiting. She thinks this may been due to "motion sickness". She had an episode of nausea/vomiting on the way to the office today. She has been tolerating water and Gatorade. She continues to note hypersalivation. She feels lightheaded and dizzy with standing. Energy level is poor. She denies fever. Periodically she feels chilled. She has pain at the low back, beneath the left breast and at the left hip. A tooth at the upper right gum line "broke". She has since noted right facial swelling. She continues to have a cough. She is intermittently short of breath.  Objective:  Vital signs in last 24 hours:  Blood pressure 119/88, pulse (!) 102, temperature 98.6 F (37 C), resp. rate 20, SpO2 99 %.    HEENT: No thrush. Right facial edema/tenderness in the region of the cheek. Tenderness, firm fullness right upper gumline, question mass. Resp: Lungs clear bilaterally. Cardio: Regular rate and rhythm. GI: Abdomen is soft. Tender at the right upper abdomen. Question palpable liver edge. Vascular: No leg edema. Skin: Mild decrease in skin turgor. Port-A-Cath without erythema.    Lab Results:  Lab Results  Component Value Date   WBC 12.8 (H) 02/12/2017   HGB 10.7 (L) 02/12/2017   HCT 32.1 (L) 02/12/2017   MCV 82.0 02/12/2017   PLT 353 02/12/2017   NEUTROABS 11.3 (H) 02/12/2017    Imaging:  No results found.  Medications: I have reviewed the patient's current medications.  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 no loss of mismatch repair protein expression,  microsatellite stable ? 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 APC alteration detected ? Staging CT scans December 2015 and PET scan 09/22/2014 consistent with metastatic bilateral lung nodules, metastatic retroperitoneal lymphadenopathy, and a probable left liver metastasis ? Cycle 1 CAPOX 10/29/2014 (Xeloda discontinued day 9 secondary to diarrhea)  ? CT 11/14/2014 revealed progressive metastatic disease at the lung bases and liver compared to a PET scan from 09/22/2014, mild improvement in retroperitoneal lymphadenopathy ? Cycle 1 FOLFOX 12/02/2014 ? Cycle 2 FOLFOX, Avastin added 12/16/2014 ? Cycle 3 FOLFOX/Avastin 12/30/2014 ? Cycle 4 FOLFOX/Avastin 01/13/2015 ? Cycle 5 FOLFOX/Avastin 01/27/2015  CT 02/10/2015 with improvement in hepatic metastases, mild improvement of lung metastases and retroperitoneal lymphadenopathy  Cycle 6 FOLFOX/Avastin 02/16/2015  Cycle 7 FOLFOX/Avastin 03/02/2015  Cycle 8 FOLFOX/Avastin 03/16/2015  Cycle 9 FOLFOX/Avastin 04/13/2015  Restaging CTs 04/20/2015 with a decrease in the size of pulmonary nodules, liver lesions, and retroperitoneal lymph nodes  Restaging CTs 07/27/2015 revealed enlargement of bilateral lung nodules, stable liver lesions, decreased periaortic adenopathy  CTs 10/05/2015 revealed further enlargement of bilateral lung nodules ,enlargement of liver lesions, and enlargement of peritoneal lymph nodes  Cycle 1 FOLFIRI/Avastin 10/12/2015  Cycle 2 FOLFIRI/Avastin 10/26/2015  Cycle 3 FOLFIRI/Avastin 11/09/2015  Cycle 4 FOLFIRI/Avastin 11/23/2015  Cycle 5 FOLFIRI/Avastin 12/28/2015  CTs 01/06/2016-decreased size of lung nodules , decreased / regression of liver lesions, regression of retroperitoneal nodes  Cycle 6 FOLFIRI/Avastin 01/11/2016 (Neulasta added)   Cycle 7 FOLFIRI/Avastin 02/01/2016 with Neulasta support  Cycle 8 FOLFIRI/Avastin 02/22/2016 with Neulasta  support  Cycle 9 FOLFIRI/Avastin 03/13/2016  with Neulasta support  Cycle 10 FOLFIRI/Avastin 04/04/2016 with Neulasta support  Restaging CTs 04/17/2016-decreased size of lung and liver lesions  Patient decided on treatment break  Restaging CTs 08/22/2016-marked progression in the lungs and liver  Cycle 1 FOLFIRI/Avastin 09/25/2016  Cycle 2 FOLFIRI/Avastin 10/18/2016  Cycle 3 FOLFIRI/Avastin 11/08/2016  Restaging CTs 11/23/2016-stable lung metastases, progression of hepatic metastases, increased right paratracheal node, seventh posterior lateral right rib fracture, T6 lucency-new, new left sacral lesion  Cycle 4 FOLFIRI/Avastin 11/29/2016  Cycle 5 FOLFIRI/Avastin 12/20/2016  Cycle 6 FOLFIRI/Avastin 01/10/2017 (Neulasta held)  Restaging CTs 01/25/2017-mild improvement in pulmonary and hepatic metastases. Similar supraclavicular/low jugular adenopathy. Decrease in abdominal adenopathy. Persistent osseous metastasis.  2. Elevated TSH, PET scan 09/22/2014 with diffuse increased metabolic uptake in the thyroid   3. Family history of multiple cancers including colon cancer in her paternal grandfather and rectal cancer in a paternal first cousin  59. Admission 11/14/2014 with nausea and diarrhea, most likely secondary to capecitabine induced enteritis-resolved. CT 11/14/2014 consistent with ileitis  5.Superficial phlebitis left hand/wrist 12/30/2014.  6. Early oxaliplatin neuropathy  7. Pain at the right gluteus and right leg-MRI of the lumbar spine 07/13/2015 confirmed a disc protrusion at L5-S1  8. Diaphoresis, rhinorrhea, shortness of breath and cough following cycle 1 FOLFIRI/Avastin, did not occur following cycle 2  9. Oral mucositis secondary to chemotherapy  10. Nonproductive coughsecondary to metastatic colon cancer  11. Neutropenia secondary to chemotherapy -Neulasta added with cycle 6 FOLFIRI 01/11/2016  12. Hospitalization  10/24/2016 through 10/25/2016 with hypotension related to dehydration, anemia. Hemoglobin improved 11/08/2016.  13. Hyponatremia. 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 Improved.  14.  Increased salivation-etiology unclear, she will try Protonix  15.  Intermittent pain-potentially related to Neulasta, Neulasta will be held with the cycle of chemotherapy given 01/10/2017    Disposition: Martha Becker completed cycle 6 FOLFIRI/Avastin 01/10/2017. Restaging CT scans 01/25/2017 showed mild improvement in liver and lung metastases. She presents today with pain and swelling involving the right upper gumline and face. She may have a dental abscess or potentially a mass. She will begin doxycycline 100 mg twice daily. We contacted her dentist and arranged for an appointment tomorrow. She will return here as scheduled on 02/14/2017. She will contact the office in the interim with any problems.  Patient seen with Dr. Benay Spice. 25 minutes were spent face-to-face at today's visit with the majority of that time involved in counseling/coordination of care.    Ned Card ANP/GNP-BC   02/12/2017  2:19 PM  This was a shared visit with Ned Card. Ms. Molock was interviewed and examined. She has multiple complaints today. The pain, failure to thrive, and nausea may be related to metastatic colon cancer. She appears to have a dental abscess or mass at the right upper gumline. We prescribed an antibiotic and she will see her dentist tomorrow. She will return as scheduled 02/14/2017.  [name], M.D.

## 2017-02-12 NOTE — Telephone Encounter (Signed)
Call placed to patient to inform her to come to [facility] now.  Patient states that she can be here between 12:30PM and 1:00PM.  Patient appreciative of call back.

## 2017-02-14 ENCOUNTER — Ambulatory Visit: Payer: 59

## 2017-02-14 ENCOUNTER — Other Ambulatory Visit (HOSPITAL_BASED_OUTPATIENT_CLINIC_OR_DEPARTMENT_OTHER): Payer: 59

## 2017-02-14 ENCOUNTER — Ambulatory Visit (HOSPITAL_BASED_OUTPATIENT_CLINIC_OR_DEPARTMENT_OTHER): Payer: 59

## 2017-02-14 ENCOUNTER — Ambulatory Visit (HOSPITAL_BASED_OUTPATIENT_CLINIC_OR_DEPARTMENT_OTHER): Payer: 59 | Admitting: Nurse Practitioner

## 2017-02-14 VITALS — BP 133/91 | HR 82

## 2017-02-14 VITALS — BP 102/65 | HR 95 | Temp 98.5°F | Resp 16 | Wt 123.3 lb

## 2017-02-14 DIAGNOSIS — M25552 Pain in left hip: Secondary | ICD-10-CM

## 2017-02-14 DIAGNOSIS — C180 Malignant neoplasm of cecum: Secondary | ICD-10-CM | POA: Diagnosis not present

## 2017-02-14 DIAGNOSIS — C7800 Secondary malignant neoplasm of unspecified lung: Secondary | ICD-10-CM

## 2017-02-14 DIAGNOSIS — C189 Malignant neoplasm of colon, unspecified: Secondary | ICD-10-CM

## 2017-02-14 DIAGNOSIS — C787 Secondary malignant neoplasm of liver and intrahepatic bile duct: Secondary | ICD-10-CM | POA: Diagnosis not present

## 2017-02-14 DIAGNOSIS — M545 Low back pain: Secondary | ICD-10-CM

## 2017-02-14 DIAGNOSIS — K047 Periapical abscess without sinus: Secondary | ICD-10-CM

## 2017-02-14 DIAGNOSIS — R112 Nausea with vomiting, unspecified: Secondary | ICD-10-CM

## 2017-02-14 DIAGNOSIS — C7951 Secondary malignant neoplasm of bone: Secondary | ICD-10-CM

## 2017-02-14 DIAGNOSIS — E860 Dehydration: Secondary | ICD-10-CM

## 2017-02-14 DIAGNOSIS — C801 Malignant (primary) neoplasm, unspecified: Secondary | ICD-10-CM

## 2017-02-14 DIAGNOSIS — C786 Secondary malignant neoplasm of retroperitoneum and peritoneum: Secondary | ICD-10-CM

## 2017-02-14 LAB — CBC WITH DIFFERENTIAL/PLATELET
BASO%: 0.6 % (ref 0.0–2.0)
BASOS ABS: 0.1 10*3/uL (ref 0.0–0.1)
EOS%: 0.2 % (ref 0.0–7.0)
Eosinophils Absolute: 0 10*3/uL (ref 0.0–0.5)
HEMATOCRIT: 31.3 % — AB (ref 34.8–46.6)
HEMOGLOBIN: 10.3 g/dL — AB (ref 11.6–15.9)
LYMPH#: 0.5 10*3/uL — AB (ref 0.9–3.3)
LYMPH%: 4 % — ABNORMAL LOW (ref 14.0–49.7)
MCH: 27.1 pg (ref 25.1–34.0)
MCHC: 33 g/dL (ref 31.5–36.0)
MCV: 82.1 fL (ref 79.5–101.0)
MONO#: 1 10*3/uL — AB (ref 0.1–0.9)
MONO%: 8 % (ref 0.0–14.0)
NEUT#: 11.1 10*3/uL — ABNORMAL HIGH (ref 1.5–6.5)
NEUT%: 87.2 % — AB (ref 38.4–76.8)
PLATELETS: 358 10*3/uL (ref 145–400)
RBC: 3.81 10*6/uL (ref 3.70–5.45)
RDW: 17 % — AB (ref 11.2–14.5)
WBC: 12.7 10*3/uL — ABNORMAL HIGH (ref 3.9–10.3)

## 2017-02-14 LAB — COMPREHENSIVE METABOLIC PANEL
ALBUMIN: 3.1 g/dL — AB (ref 3.5–5.0)
ALK PHOS: 219 U/L — AB (ref 40–150)
ALT: 9 U/L (ref 0–55)
ANION GAP: 11 meq/L (ref 3–11)
AST: 35 U/L — ABNORMAL HIGH (ref 5–34)
BUN: 11.1 mg/dL (ref 7.0–26.0)
CALCIUM: 10.1 mg/dL (ref 8.4–10.4)
CO2: 26 mEq/L (ref 22–29)
CREATININE: 0.7 mg/dL (ref 0.6–1.1)
Chloride: 89 mEq/L — ABNORMAL LOW (ref 98–109)
EGFR: >90 mL/min/{1.73_m2} (ref >90)
Glucose: 104 mg/dl (ref 70–140)
Potassium: 4 mEq/L (ref 3.5–5.1)
Sodium: 126 mEq/L — ABNORMAL LOW (ref 136–145)
Total Bilirubin: 0.77 mg/dL (ref 0.20–1.20)
Total Protein: 6.9 g/dL (ref 6.4–8.3)

## 2017-02-14 LAB — CEA (IN HOUSE-CHCC): CEA (CHCC-IN HOUSE): 131.45 ng/mL — AB (ref 0.00–5.00)

## 2017-02-14 LAB — UA PROTEIN, DIPSTICK - CHCC

## 2017-02-14 MED ORDER — HEPARIN SOD (PORK) LOCK FLUSH 100 UNIT/ML IV SOLN
500.0000 [IU] | Freq: Once | INTRAVENOUS | Status: AC
Start: 2017-02-14 — End: 2017-02-14
  Administered 2017-02-14: 500 [IU] via INTRAVENOUS
  Filled 2017-02-14: qty 5

## 2017-02-14 MED ORDER — SODIUM CHLORIDE 0.9 % IV SOLN
1000.0000 mL | Freq: Once | INTRAVENOUS | Status: AC
  Administered 2017-02-14: 1000 mL via INTRAVENOUS

## 2017-02-14 MED ORDER — SODIUM CHLORIDE 0.9 % IV SOLN: INTRAVENOUS | Status: DC

## 2017-02-14 MED ORDER — SODIUM CHLORIDE 0.9% FLUSH
10.0000 mL | INTRAVENOUS | Status: DC | PRN
  Administered 2017-02-14: 10 mL via INTRAVENOUS
  Filled 2017-02-14: qty 10

## 2017-02-14 MED ORDER — SODIUM CHLORIDE 0.9 % IJ SOLN
10.0000 mL | Freq: Once | INTRAMUSCULAR | Status: DC
  Filled 2017-02-14: qty 10

## 2017-02-14 NOTE — Progress Notes (Addendum)
[place] OFFICE PROGRESS NOTE   Diagnosis:  Colon cancer  INTERVAL HISTORY:   Martha Becker returns as scheduled. She completed cycle of FOLFIRI/Avastin 01/10/2017. She was seen in the office for an unscheduled visit on 02/12/2017 due to nausea and right facial swelling. We made an appointment with her dentist for evaluation yesterday. She reports she was found to have an abscess and underwent a tooth extraction. Facial pain is better. She also notes less salivation. She had an episode of nausea/vomiting on the car ride here today. Some constipation. She continues to feel weak. She has periodic pain at the left abdomen. This is not a problem today. She continues to have pain across the low back, at the left hip and intermittently notes radiation into the left leg. She has tried Ultram and oxycodone. Oxycodone provides some relief.  Objective:  Vital signs in last 24 hours:  Blood pressure 102/65, pulse 95, temperature 98.5 F (36.9 C), temperature source Oral, resp. rate 16, weight 123 lb 4.8 oz (55.9 kg), SpO2 96 %.    HEENT: No thrush or ulcers. Slight right facial swelling in the region of the cheek. No significant associated tenderness. Erythema/edema right upper anterolateral gumline. Resp: Lungs clear bilaterally. Cardio: Regular rate and rhythm. GI: Abdomen is soft. Vascular: No leg edema. Neuro: Alert and oriented. Question trace weakness right proximal leg. Motor strength otherwise 5 over 5.  Skin: No rash. Port-A-Cath without erythema.    Lab Results:  Lab Results  Component Value Date   WBC 12.8 (H) 02/12/2017   HGB 10.7 (L) 02/12/2017   HCT 32.1 (L) 02/12/2017   MCV 82.0 02/12/2017   PLT 353 02/12/2017   NEUTROABS 11.3 (H) 02/12/2017    Imaging:  No results found.  Medications: I have reviewed the patient's current medications.  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 no loss of mismatch repair protein expression, microsatellite stable ? 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 APC alteration detected ? Staging CT scans December 2015 and PET scan 09/22/2014 consistent with metastatic bilateral lung nodules, metastatic retroperitoneal lymphadenopathy, and a probable left liver metastasis ? Cycle 1 CAPOX 10/29/2014 (Xeloda discontinued day 9 secondary to diarrhea)  ? CT 11/14/2014 revealed progressive metastatic disease at the lung bases and liver compared to a PET scan from 09/22/2014, mild improvement in retroperitoneal lymphadenopathy ? Cycle 1 FOLFOX 12/02/2014 ? Cycle 2 FOLFOX, Avastin added 12/16/2014 ? Cycle 3 FOLFOX/Avastin 12/30/2014 ? Cycle 4 FOLFOX/Avastin 01/13/2015 ? Cycle 5 FOLFOX/Avastin 01/27/2015  CT 02/10/2015 with improvement in hepatic metastases, mild improvement of lung metastases and retroperitoneal lymphadenopathy  Cycle 6 FOLFOX/Avastin 02/16/2015  Cycle 7 FOLFOX/Avastin 03/02/2015  Cycle 8 FOLFOX/Avastin 03/16/2015  Cycle 9 FOLFOX/Avastin 04/13/2015  Restaging CTs 04/20/2015 with a decrease in the size of pulmonary nodules, liver lesions, and retroperitoneal lymph nodes  Restaging CTs 07/27/2015 revealed enlargement of bilateral lung nodules, stable liver lesions, decreased periaortic adenopathy  CTs 10/05/2015 revealed further enlargement of bilateral lung nodules ,enlargement of liver lesions, and enlargement of peritoneal lymph nodes  Cycle 1 FOLFIRI/Avastin 10/12/2015  Cycle 2 FOLFIRI/Avastin 10/26/2015  Cycle 3 FOLFIRI/Avastin 11/09/2015  Cycle 4 FOLFIRI/Avastin 11/23/2015  Cycle 5 FOLFIRI/Avastin 12/28/2015  CTs 01/06/2016-decreased size of lung nodules , decreased / regression of liver lesions, regression of retroperitoneal nodes  Cycle 6 FOLFIRI/Avastin 01/11/2016 (Neulasta added)   Cycle 7 FOLFIRI/Avastin  02/01/2016 with Neulasta support  Cycle 8 FOLFIRI/Avastin 02/22/2016 with Neulasta support  Cycle 9 FOLFIRI/Avastin 03/13/2016 with Neulasta support  Cycle 10 FOLFIRI/Avastin 04/04/2016 with Neulasta support  Restaging CTs 04/17/2016-decreased size of lung and liver lesions  Patient decided on treatment break  Restaging CTs 08/22/2016-marked progression in the lungs and liver  Cycle 1 FOLFIRI/Avastin 09/25/2016  Cycle 2 FOLFIRI/Avastin 10/18/2016  Cycle 3 FOLFIRI/Avastin 11/08/2016  Restaging CTs 11/23/2016-stable lung metastases, progression of hepatic metastases, increased right paratracheal node, seventh posterior lateral right rib fracture, T6 lucency-new, new left sacral lesion  Cycle 4 FOLFIRI/Avastin 11/29/2016  Cycle 5 FOLFIRI/Avastin 12/20/2016  Cycle 6 FOLFIRI/Avastin 01/10/2017 (Neulasta held)  Restaging CTs 01/25/2017-mild improvement in pulmonary and hepatic metastases. Similar supraclavicular/low jugular adenopathy. Decrease in abdominal adenopathy. Persistent osseous metastasis.  2. Elevated TSH, PET scan 09/22/2014 with diffuse increased metabolic uptake in the thyroid   3. Family history of multiple cancers including colon cancer in her paternal grandfather and rectal cancer in a paternal first cousin  86. Admission 11/14/2014 with nausea and diarrhea, most likely secondary to capecitabine induced enteritis-resolved. CT 11/14/2014 consistent with ileitis  5.Superficial phlebitis left hand/wrist 12/30/2014.  6. Early oxaliplatin neuropathy  7. Pain at the right gluteus and right leg-MRI of the lumbar spine 07/13/2015 confirmed a disc protrusion at L5-S1  8. Diaphoresis, rhinorrhea, shortness of breath and cough following cycle 1 FOLFIRI/Avastin, did not occur following cycle 2  9. Oral mucositis secondary to chemotherapy  10. Nonproductive coughsecondary to metastatic colon cancer  11. Neutropenia secondary to  chemotherapy -Neulasta added with cycle 6 FOLFIRI 01/11/2016  12. Hospitalization 10/24/2016 through 10/25/2016 with hypotension related to dehydration, anemia. Hemoglobin improved 11/08/2016.  13. Hyponatremia. 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  14. Increased salivation-etiology unclear, she will try Protonix. Improved 02/14/2017.  15. Intermittent pain-potentially related to Neulasta, Neulasta will be held with the cycle of chemotherapy given 01/10/2017  16. Dental abscess status post evaluation by dental medicine. She continues doxycycline.  17. Low back, left hip and leg pain.    Disposition: Martha Becker appears improved. She will continue antibiotics for the dental abscess. We will continue to hold chemotherapy for now. She will receive an additional liter of IV fluids while in the office today.  She has persistent low-back left hip and leg pain. We reviewed the CT report/images from 01/25/2017 with Martha Becker and her friend. There is no obvious etiology to explain the pain. Dr. Benay Spice recommends proceeding with a PET scan.  We reviewed today's labs. She has mild hypercalcemia. She does not appear symptomatic. Given the recent dental infection/procedure we will not administer Zometa. We will repeat a chemistry panel at the time of her next visit.  She will return for a follow-up visit on 02/23/2017. She will contact the office in the interim with any problems.  Patient seen with Dr. Benay Spice. 25 minutes were spent face-to-face at today's visit with the majority of that time involved in counseling/coordination of care.      Ned Card ANP/GNP-BC   02/14/2017  11:05 AM  This was a shared visit with Ned Card. Martha Becker was interviewed and examined. Her overall status appears improved with treatment of the dental abscess. The etiology of the hip and leg pain is unclear. The pain could be related to lumbar disc disease or metastatic disease involving  the spine/pelvis. She will be referred for a restaging PET scan.  [name], M.D.

## 2017-02-14 NOTE — Patient Instructions (Signed)
Dehydration, Adult Dehydration is a condition in which there is not enough fluid or water in the body. This happens when you lose more fluids than you take in. Important organs, such as the kidneys, brain, and heart, cannot function without a proper amount of fluids. Any loss of fluids from the body can lead to dehydration. Dehydration can range from mild to severe. This condition should be treated right away to prevent it from becoming severe. What are the causes? This condition may be caused by:  Vomiting.  Diarrhea.  Excessive sweating, such as from heat exposure or exercise.  Not drinking enough fluid, especially: ? When ill. ? While doing activity that requires a lot of energy.  Excessive urination.  Fever.  Infection.  Certain medicines, such as medicines that cause the body to lose excess fluid (diuretics).  Inability to access safe drinking water.  Reduced physical ability to get adequate water and food.  What increases the risk? This condition is more likely to develop in people:  Who have a poorly controlled long-term (chronic) illness, such as diabetes, heart disease, or kidney disease.  Who are age 65 or older.  Who are disabled.  Who live in a place with high altitude.  Who play endurance sports.  What are the signs or symptoms? Symptoms of mild dehydration may include:  Thirst.  Dry lips.  Slightly dry mouth.  Dry, warm skin.  Dizziness. Symptoms of moderate dehydration may include:  Very dry mouth.  Muscle cramps.  Dark urine. Urine may be the color of tea.  Decreased urine production.  Decreased tear production.  Heartbeat that is irregular or faster than normal (palpitations).  Headache.  Light-headedness, especially when you stand up from a sitting position.  Fainting (syncope). Symptoms of severe dehydration may include:  Changes in skin, such as: ? Cold and clammy skin. ? Blotchy (mottled) or pale skin. ? Skin that does  not quickly return to normal after being lightly pinched and released (poor skin turgor).  Changes in body fluids, such as: ? Extreme thirst. ? No tear production. ? Inability to sweat when body temperature is high, such as in hot weather. ? Very little urine production.  Changes in vital signs, such as: ? Weak pulse. ? Pulse that is more than 100 beats a minute when sitting still. ? Rapid breathing. ? Low blood pressure.  Other changes, such as: ? Sunken eyes. ? Cold hands and feet. ? Confusion. ? Lack of energy (lethargy). ? Difficulty waking up from sleep. ? Short-term weight loss. ? Unconsciousness. How is this diagnosed? This condition is diagnosed based on your symptoms and a physical exam. Blood and urine tests may be done to help confirm the diagnosis. How is this treated? Treatment for this condition depends on the severity. Mild or moderate dehydration can often be treated at home. Treatment should be started right away. Do not wait until dehydration becomes severe. Severe dehydration is an emergency and it needs to be treated in a hospital. Treatment for mild dehydration may include:  Drinking more fluids.  Replacing salts and minerals in your blood (electrolytes) that you may have lost. Treatment for moderate dehydration may include:  Drinking an oral rehydration solution (ORS). This is a drink that helps you replace fluids and electrolytes (rehydrate). It can be found at pharmacies and retail stores. Treatment for severe dehydration may include:  Receiving fluids through an IV tube.  Receiving an electrolyte solution through a feeding tube that is passed through your nose   and into your stomach (nasogastric tube, or NG tube).  Correcting any abnormalities in electrolytes.  Treating the underlying cause of dehydration. Follow these instructions at home:  If directed by your health care provider, drink an ORS: ? Make an ORS by following instructions on the  package. ? Start by drinking small amounts, about  cup (120 mL) every 5-10 minutes. ? Slowly increase how much you drink until you have taken the amount recommended by your health care provider.  Drink enough clear fluid to keep your urine clear or pale yellow. If you were told to drink an ORS, finish the ORS first, then start slowly drinking other clear fluids. Drink fluids such as: ? Water. Do not drink only water. Doing that can lead to having too little salt (sodium) in the body (hyponatremia). ? Ice chips. ? Fruit juice that you have added water to (diluted fruit juice). ? Low-calorie sports drinks.  Avoid: ? Alcohol. ? Drinks that contain a lot of sugar. These include high-calorie sports drinks, fruit juice that is not diluted, and soda. ? Caffeine. ? Foods that are greasy or contain a lot of fat or sugar.  Take over-the-counter and prescription medicines only as told by your health care provider.  Do not take sodium tablets. This can lead to having too much sodium in the body (hypernatremia).  Eat foods that contain a healthy balance of electrolytes, such as bananas, oranges, potatoes, tomatoes, and spinach.  Keep all follow-up visits as told by your health care provider. This is important. Contact a health care provider if:  You have abdominal pain that: ? Gets worse. ? Stays in one area (localizes).  You have a rash.  You have a stiff neck.  You are more irritable than usual.  You are sleepier or more difficult to wake up than usual.  You feel weak or dizzy.  You feel very thirsty.  You have urinated only a small amount of very dark urine over 6-8 hours. Get help right away if:  You have symptoms of severe dehydration.  You cannot drink fluids without vomiting.  Your symptoms get worse with treatment.  You have a fever.  You have a severe headache.  You have vomiting or diarrhea that: ? Gets worse. ? Does not go away.  You have blood or green matter  (bile) in your vomit.  You have blood in your stool. This may cause stool to look black and tarry.  You have not urinated in 6-8 hours.  You faint.  Your heart rate while sitting still is over 100 beats a minute.  You have trouble breathing. This information is not intended to replace advice given to you by your health care provider. Make sure you discuss any questions you have with your health care provider. Document Released: 08/28/2005 Document Revised: 03/24/2016 Document Reviewed: 10/22/2015 Elsevier Interactive Patient Education  2018 [employer]  

## 2017-02-15 ENCOUNTER — Other Ambulatory Visit: Payer: Self-pay | Admitting: Oncology

## 2017-02-15 ENCOUNTER — Other Ambulatory Visit: Payer: Self-pay | Admitting: Nurse Practitioner

## 2017-02-15 DIAGNOSIS — C787 Secondary malignant neoplasm of liver and intrahepatic bile duct: Principal | ICD-10-CM

## 2017-02-15 DIAGNOSIS — C180 Malignant neoplasm of cecum: Secondary | ICD-10-CM

## 2017-02-15 DIAGNOSIS — C786 Secondary malignant neoplasm of retroperitoneum and peritoneum: Secondary | ICD-10-CM

## 2017-02-15 DIAGNOSIS — C7800 Secondary malignant neoplasm of unspecified lung: Secondary | ICD-10-CM

## 2017-02-15 DIAGNOSIS — C801 Malignant (primary) neoplasm, unspecified: Secondary | ICD-10-CM

## 2017-02-15 DIAGNOSIS — C189 Malignant neoplasm of colon, unspecified: Secondary | ICD-10-CM

## 2017-02-16 ENCOUNTER — Telehealth: Payer: Self-pay | Admitting: Nurse Practitioner

## 2017-02-16 ENCOUNTER — Other Ambulatory Visit: Payer: Self-pay | Admitting: *Deleted

## 2017-02-16 DIAGNOSIS — C7800 Secondary malignant neoplasm of unspecified lung: Secondary | ICD-10-CM

## 2017-02-16 DIAGNOSIS — C180 Malignant neoplasm of cecum: Secondary | ICD-10-CM

## 2017-02-16 DIAGNOSIS — C786 Secondary malignant neoplasm of retroperitoneum and peritoneum: Secondary | ICD-10-CM

## 2017-02-16 DIAGNOSIS — C801 Malignant (primary) neoplasm, unspecified: Secondary | ICD-10-CM

## 2017-02-16 MED ORDER — TRAMADOL HCL 50 MG PO TABS: 25.0000 mg | ORAL_TABLET | Freq: Four times a day (QID) | ORAL | 0 refills | Status: AC | PRN

## 2017-02-16 NOTE — Telephone Encounter (Signed)
Sent reminder letter. - unable to leave message for patient - voicemail full . Scheduled appt per 6/6 los.

## 2017-02-16 NOTE — Telephone Encounter (Signed)
Message received from triage that pt is requesting a refill of Tramadol and antibiotic.  Call placed back to patient and patient informed that she is to finish current dose of Doxycycline and then to contact her dentist if she is not any better.  Patient also informed that prescription for Tramadol has been called in to her pharmacy.  Patient is appreciative of call back and has no further questions at this time.

## 2017-02-23 ENCOUNTER — Encounter (HOSPITAL_COMMUNITY)
Admission: RE | Admit: 2017-02-23 | Discharge: 2017-02-23 | Disposition: A | Discharge status: Home / Self Care | Payer: Medicare Other | Source: Ambulatory Visit | Attending: Nurse Practitioner | Admitting: Nurse Practitioner

## 2017-02-23 ENCOUNTER — Other Ambulatory Visit (HOSPITAL_BASED_OUTPATIENT_CLINIC_OR_DEPARTMENT_OTHER): Payer: 59

## 2017-02-23 ENCOUNTER — Ambulatory Visit (HOSPITAL_BASED_OUTPATIENT_CLINIC_OR_DEPARTMENT_OTHER): Payer: 59

## 2017-02-23 DIAGNOSIS — C189 Malignant neoplasm of colon, unspecified: Secondary | ICD-10-CM | POA: Diagnosis present

## 2017-02-23 DIAGNOSIS — C180 Malignant neoplasm of cecum: Secondary | ICD-10-CM

## 2017-02-23 DIAGNOSIS — C801 Malignant (primary) neoplasm, unspecified: Secondary | ICD-10-CM | POA: Diagnosis present

## 2017-02-23 DIAGNOSIS — C7951 Secondary malignant neoplasm of bone: Secondary | ICD-10-CM

## 2017-02-23 DIAGNOSIS — C786 Secondary malignant neoplasm of retroperitoneum and peritoneum: Secondary | ICD-10-CM

## 2017-02-23 DIAGNOSIS — C787 Secondary malignant neoplasm of liver and intrahepatic bile duct: Secondary | ICD-10-CM | POA: Insufficient documentation

## 2017-02-23 DIAGNOSIS — Z95828 Presence of other vascular implants and grafts: Secondary | ICD-10-CM

## 2017-02-23 DIAGNOSIS — C7800 Secondary malignant neoplasm of unspecified lung: Secondary | ICD-10-CM | POA: Diagnosis not present

## 2017-02-23 LAB — COMPREHENSIVE METABOLIC PANEL
ALBUMIN: 2.9 g/dL — AB (ref 3.5–5.0)
ALK PHOS: 289 U/L — AB (ref 40–150)
ALT: 18 U/L (ref 0–55)
ANION GAP: 13 meq/L — AB (ref 3–11)
AST: 46 U/L — AB (ref 5–34)
BUN: 7.4 mg/dL (ref 7.0–26.0)
CALCIUM: 10.3 mg/dL (ref 8.4–10.4)
CHLORIDE: 87 meq/L — AB (ref 98–109)
CO2: 26 mEq/L (ref 22–29)
CREATININE: 0.6 mg/dL (ref 0.6–1.1)
EGFR: >90 mL/min/{1.73_m2} (ref >90)
Glucose: 74 mg/dl (ref 70–140)
Potassium: 4.3 mEq/L (ref 3.5–5.1)
Sodium: 125 mEq/L — ABNORMAL LOW (ref 136–145)
Total Bilirubin: 0.76 mg/dL (ref 0.20–1.20)
Total Protein: 6.8 g/dL (ref 6.4–8.3)

## 2017-02-23 LAB — GLUCOSE, CAPILLARY: Glucose-Capillary: 81 mg/dL (ref 65–99)

## 2017-02-23 MED ORDER — SODIUM CHLORIDE 0.9% FLUSH
10.0000 mL | INTRAVENOUS | Status: DC | PRN
  Administered 2017-02-23: 10 mL via INTRAVENOUS
  Filled 2017-02-23: qty 10

## 2017-02-23 MED ORDER — FLUDEOXYGLUCOSE F - 18 (FDG) INJECTION
7.0000 | Freq: Once | INTRAVENOUS | Status: AC | PRN
  Administered 2017-02-23: 7 via INTRAVENOUS

## 2017-02-26 ENCOUNTER — Other Ambulatory Visit: Payer: 59

## 2017-02-26 ENCOUNTER — Other Ambulatory Visit: Payer: Self-pay | Admitting: *Deleted

## 2017-02-26 ENCOUNTER — Ambulatory Visit: Payer: 59 | Admitting: Nurse Practitioner

## 2017-02-26 ENCOUNTER — Ambulatory Visit (HOSPITAL_BASED_OUTPATIENT_CLINIC_OR_DEPARTMENT_OTHER): Payer: 59 | Admitting: Oncology

## 2017-02-26 VITALS — BP 102/66 | HR 112 | Temp 98.4°F | Resp 18 | Ht 66.0 in | Wt 115.5 lb

## 2017-02-26 DIAGNOSIS — C787 Secondary malignant neoplasm of liver and intrahepatic bile duct: Secondary | ICD-10-CM | POA: Diagnosis not present

## 2017-02-26 DIAGNOSIS — M79605 Pain in left leg: Secondary | ICD-10-CM | POA: Diagnosis not present

## 2017-02-26 DIAGNOSIS — R05 Cough: Secondary | ICD-10-CM

## 2017-02-26 DIAGNOSIS — M542 Cervicalgia: Secondary | ICD-10-CM

## 2017-02-26 DIAGNOSIS — C7951 Secondary malignant neoplasm of bone: Secondary | ICD-10-CM | POA: Diagnosis not present

## 2017-02-26 DIAGNOSIS — Z8672 Personal history of thrombophlebitis: Secondary | ICD-10-CM | POA: Diagnosis not present

## 2017-02-26 DIAGNOSIS — M25552 Pain in left hip: Secondary | ICD-10-CM

## 2017-02-26 DIAGNOSIS — C7800 Secondary malignant neoplasm of unspecified lung: Secondary | ICD-10-CM | POA: Diagnosis not present

## 2017-02-26 DIAGNOSIS — C180 Malignant neoplasm of cecum: Secondary | ICD-10-CM | POA: Diagnosis not present

## 2017-02-26 DIAGNOSIS — R0600 Dyspnea, unspecified: Secondary | ICD-10-CM

## 2017-02-26 DIAGNOSIS — Z800 Family history of malignant neoplasm of digestive organs: Secondary | ICD-10-CM

## 2017-02-26 MED ORDER — OXYCODONE HCL 5 MG PO TABS: 5.0000 mg | ORAL_TABLET | ORAL | 0 refills | Status: AC | PRN

## 2017-02-26 MED ORDER — MORPHINE SULFATE ER 15 MG PO TBCR: EXTENDED_RELEASE_TABLET | ORAL | 0 refills | Status: AC

## 2017-02-26 MED ORDER — LEVOTHYROXINE SODIUM 75 MCG PO TABS: ORAL_TABLET | ORAL | 1 refills | Status: AC

## 2017-02-26 NOTE — Progress Notes (Signed)
Call placed and referral made to [facility] for patient per order of Dr. Benay Spice.

## 2017-02-26 NOTE — Progress Notes (Signed)
[name] OFFICE PROGRESS NOTE   Diagnosis: Colon cancer  INTERVAL HISTORY:   [name] returns as scheduled. She continues to have pain at the lower back and pelvis. She has a poor energy level. She lives alone. Dental symptoms have resolved.  Objective:  Vital signs in last 24 hours:  Blood pressure 102/66, pulse (!) 112, temperature 98.4 F (36.9 C), temperature source Oral, resp. rate 18, height _0  (1.676 m), weight 115 lb 8 oz (52.4 kg), SpO2 100 %.    Resp: Lungs clear bilaterally Cardio: Regular rate and rhythm GI: Fullness in the upper abdomen without a discrete liver edge Vascular: No leg edema Neuro: Alert and oriented, she was able to ambulate to the examination table with assistance. The leg strength appears intact bilaterally.    Portacath/PICC-without erythema  Lab Results:  Lab Results  Component Value Date   WBC 12.7 (H) 02/14/2017   HGB 10.3 (L) 02/14/2017   HCT 31.3 (L) 02/14/2017   MCV 82.1 02/14/2017   PLT 358 02/14/2017   NEUTROABS 11.1 (H) 02/14/2017    CMP     Component Value Date/Time   NA 125 (L) 02/23/2017 1215   K 4.3 02/23/2017 1215   CL 97 (L) 10/25/2016 0500   CO2 26 02/23/2017 1215   GLUCOSE 74 02/23/2017 1215   BUN 7.4 02/23/2017 1215   CREATININE 0.6 02/23/2017 1215   CALCIUM 10.3 02/23/2017 1215   PROT 6.8 02/23/2017 1215   ALBUMIN 2.9 (L) 02/23/2017 1215   AST 46 (H) 02/23/2017 1215   ALT 18 02/23/2017 1215   ALKPHOS 289 (H) 02/23/2017 1215   BILITOT 0.76 02/23/2017 1215   GFRNONAA >60 10/25/2016 0500   GFRAA >60 10/25/2016 0500    Lab Results  Component Value Date   CEA1 131.45 (H) 02/14/2017    Lab Results  Component Value Date   INR 1.08 08/28/2014    Imaging:  Nm Pet Image Restag (ps) Skull Base To Thigh  Result Date: 02/23/2017 CLINICAL DATA:  Subsequent treatment strategy for metastatic colon cancer. EXAM: NUCLEAR MEDICINE PET SKULL BASE TO THIGH TECHNIQUE: 7.0 mCi F-18 FDG was  injected intravenously. Full-ring PET imaging was performed from the skull base to thigh after the radiotracer. CT data was obtained and used for attenuation correction and anatomic localization. FASTING BLOOD GLUCOSE:  Value: 81 mg/dl COMPARISON:  PET-CT dated 09/22/2014. CT chest abdomen pelvis dated 01/25/2017. FINDINGS: NECK No hypermetabolic lymph nodes in the neck. Bilateral thyroid hypermetabolism, max SUV 17.1 on the left, previously 12.5 on PET. This is nonspecific. CHEST Bilateral supraclavicular lymphadenopathy, max SUV 11.2 on the right, new from prior PET. This is grossly unchanged from recent CT. Progression of numerous bilateral pulmonary metastases throughout all lobes. Representative max SUV 10.8 in the left lower lobe. This has markedly progressed from prior PET, but has also progressed from recent CT. For example, a 4.6 x 5.8 cm left lower lobe mass (series 8/ image 40) previously measured 4.0 x 5.6 cm. Right chest port terminates at the cavoatrial junction. ABDOMEN/PELVIS Widespread hepatic metastases, coalescent in the anterior right hepatic lobe and throughout the left hepatic lobe, max SUV 22.1 in the left hepatic lobe. This has significantly progressed from prior PET-CT but is similar to the recent CT. Porta hepatic node, max SUV 6.5. This is new from prior PET but grossly unchanged from recent CT. Status post right hemicolectomy with appendectomy. SKELETON Widespread osseous metastases throughout the visualized axial and appendicular skeleton. For example: --Mid thoracic  vertebral body demonstrates max SUV 12.5 --Mid lumbar vertebral body demonstrates max SUV 22.3 --Sacral metastasis demonstrates max SUV 18.8 --Right proximal humerus metastasis demonstrates max SUV 21.8 This has significantly progressed from prior PET-CT. Many of these lesions are not well demonstrated on CT, making comparison to that study difficult. IMPRESSION: Widespread pulmonary, hepatic, and osseous metastases.  Additional nodal metastases, as above. These have mildly progressed from recent CT and have significantly progressed from 2016 PET-CT. Electronically Signed   By: Charline Bills M.D.   On: 02/23/2017 16:53    Medications: I have reviewed the patient's current medications.  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 no loss of mismatch repair protein expression, microsatellite stable ? 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 APC alteration detected ? Staging CT scans December 2015 and PET scan 09/22/2014 consistent with metastatic bilateral lung nodules, metastatic retroperitoneal lymphadenopathy, and a probable left liver metastasis ? Cycle 1 CAPOX 10/29/2014 (Xeloda discontinued day 9 secondary to diarrhea)  ? CT 11/14/2014 revealed progressive metastatic disease at the lung bases and liver compared to a PET scan from 09/22/2014, mild improvement in retroperitoneal lymphadenopathy ? Cycle 1 FOLFOX 12/02/2014 ? Cycle 2 FOLFOX, Avastin added 12/16/2014 ? Cycle 3 FOLFOX/Avastin 12/30/2014 ? Cycle 4 FOLFOX/Avastin 01/13/2015 ? Cycle 5 FOLFOX/Avastin 01/27/2015  CT 02/10/2015 with improvement in hepatic metastases, mild improvement of lung metastases and retroperitoneal lymphadenopathy  Cycle 6 FOLFOX/Avastin 02/16/2015  Cycle 7 FOLFOX/Avastin 03/02/2015  Cycle 8 FOLFOX/Avastin 03/16/2015  Cycle 9 FOLFOX/Avastin 04/13/2015  Restaging CTs 04/20/2015 with a decrease in the size of pulmonary nodules, liver lesions, and retroperitoneal lymph nodes  Restaging CTs 07/27/2015 revealed enlargement of bilateral lung nodules, stable liver lesions, decreased periaortic adenopathy  CTs 10/05/2015 revealed further enlargement of bilateral lung nodules ,enlargement of liver lesions, and enlargement of peritoneal lymph  nodes  Cycle 1 FOLFIRI/Avastin 10/12/2015  Cycle 2 FOLFIRI/Avastin 10/26/2015  Cycle 3 FOLFIRI/Avastin 11/09/2015  Cycle 4 FOLFIRI/Avastin 11/23/2015  Cycle 5 FOLFIRI/Avastin 12/28/2015  CTs 01/06/2016-decreased size of lung nodules , decreased / regression of liver lesions, regression of retroperitoneal nodes  Cycle 6 FOLFIRI/Avastin 01/11/2016 (Neulasta added)   Cycle 7 FOLFIRI/Avastin 02/01/2016 with Neulasta support  Cycle 8 FOLFIRI/Avastin 02/22/2016 with Neulasta support  Cycle 9 FOLFIRI/Avastin 03/13/2016 with Neulasta support  Cycle 10 FOLFIRI/Avastin 04/04/2016 with Neulasta support  Restaging CTs 04/17/2016-decreased size of lung and liver lesions  Patient decided on treatment break  Restaging CTs 08/22/2016-marked progression in the lungs and liver  Cycle 1 FOLFIRI/Avastin 09/25/2016  Cycle 2 FOLFIRI/Avastin 10/18/2016  Cycle 3 FOLFIRI/Avastin 11/08/2016  Restaging CTs 11/23/2016-stable lung metastases, progression of hepatic metastases, increased right paratracheal node, seventh posterior lateral right rib fracture, T6 lucency-new, new left sacral lesion  Cycle 4 FOLFIRI/Avastin 11/29/2016  Cycle 5 FOLFIRI/Avastin 12/20/2016  Cycle 6 FOLFIRI/Avastin 01/10/2017 (Neulasta held)  Restaging CTs 01/25/2017-mild improvement in pulmonary and hepatic metastases. Similar supraclavicular/low jugular adenopathy. Decrease in abdominal adenopathy. Persistent osseous metastasis.  PET scan 02/24/2017-stable liver lesions, progressive lung masses, widespread bone metastases  2. Elevated TSH, PET scan 09/22/2014 with diffuse increased metabolic uptake in the thyroid   3. Family history of multiple cancers including colon cancer in her paternal grandfather and rectal cancer in a paternal first cousin  4. Admission 11/14/2014 with nausea and diarrhea, most likely secondary to capecitabine induced enteritis-resolved. CT 11/14/2014 consistent with  ileitis  5.Superficial phlebitis left hand/wrist 12/30/2014.  6. Early oxaliplatin  neuropathy  7. Pain at the right gluteus and right leg-MRI of the lumbar spine 07/13/2015 confirmed a disc protrusion at L5-S1  8. Diaphoresis, rhinorrhea, shortness of breath and cough following cycle 1 FOLFIRI/Avastin, did not occur following cycle 2  9. Oral mucositis secondary to chemotherapy  10. Nonproductive coughsecondary to metastatic colon cancer  11. Neutropenia secondary to chemotherapy -Neulasta added with cycle 6 FOLFIRI 01/11/2016  12. Hospitalization 10/24/2016 through 10/25/2016 with hypotension related to dehydration, anemia. Hemoglobin improved 11/08/2016.  13. Hyponatremia. 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  14. Increased salivation-etiology unclear, she will try Protonix. Improved 02/14/2017.  15. Intermittent pain-potentially related to Neulasta, Neulasta will be held with the cycle of chemotherapy given 01/10/2017  16. Dental abscess status post evaluation by dental medicine. She continues doxycycline.  17. Low back, left hip and leg pain.    Disposition:  [name] has metastatic colon cancer. The metastatic tumor burden has progressed while on FOLFIRI/Avastin. She has a poor performance status. She is symptomatic with pain related to multiple bone metastases. She has dyspnea and a cough secondary to lung metastases.  I reviewed the CT and PET images with her. I recommend Hospice care. She is in agreement. We discussed palliative radiation, but I think it will be difficult for her to undergo radiation and I'm concerned it would be difficult to target a single/few symptomatic lesions.  She stated that quality of life is a chief concern. She is concerned about developing somnolence while on narcotics. We decided to begin MS Contin once daily. She will continue oxycodone as needed.  I discussed CPR and ACLS issues with  Ms. Defibaugh. She will be placed on a no CODE BLUE status.  Ms. Estabrooks will return for an office visit in 2 weeks.  30 minutes were spent with the patient today. The majority of the time was used for counseling and coordination of care.  Donneta Romberg, MD  02/26/2017  9:00 AM

## 2017-02-27 ENCOUNTER — Telehealth: Payer: Self-pay | Admitting: *Deleted

## 2017-02-27 ENCOUNTER — Encounter: Payer: Self-pay | Admitting: Oncology

## 2017-02-27 NOTE — Telephone Encounter (Signed)
Message from Ms. Collins with [facility] thanking MD for referral. They have admitted pt to hospice.

## 2017-02-27 NOTE — Progress Notes (Signed)
Received PA request from Oren Beckmann for MS Contin.  Called [facility](Maria) to initiate PA. Answered clinical questions.  PA approved 02/27/17-02/27/18 and received confirmation via fax.  Called [facility] pharmacy(Steve) to advise of approval whom requested PA. He states he gave the prescription back to patient so they no linger have it.  Attempted to reach patient to advise of status and her vociemail was full.  Gave a copy to [facility] RN whom is shadowing with Tanya.

## 2017-03-02 ENCOUNTER — Telehealth: Payer: Self-pay | Admitting: Oncology

## 2017-03-02 NOTE — Telephone Encounter (Signed)
Appointment scheduled and confirmed with patient,per 02/26/17 los.

## 2017-03-05 ENCOUNTER — Telehealth: Payer: Self-pay | Admitting: Hematology

## 2017-03-05 NOTE — Telephone Encounter (Signed)
Patient was given a copy of the appointment schedule. °

## 2017-03-16 ENCOUNTER — Ambulatory Visit (HOSPITAL_BASED_OUTPATIENT_CLINIC_OR_DEPARTMENT_OTHER): Payer: 59

## 2017-03-16 ENCOUNTER — Ambulatory Visit (HOSPITAL_BASED_OUTPATIENT_CLINIC_OR_DEPARTMENT_OTHER): Payer: 59 | Admitting: Nurse Practitioner

## 2017-03-16 VITALS — BP 106/75 | HR 117 | Temp 97.8°F | Resp 18 | Ht 66.0 in | Wt 119.7 lb

## 2017-03-16 DIAGNOSIS — C7800 Secondary malignant neoplasm of unspecified lung: Secondary | ICD-10-CM

## 2017-03-16 DIAGNOSIS — C787 Secondary malignant neoplasm of liver and intrahepatic bile duct: Principal | ICD-10-CM

## 2017-03-16 DIAGNOSIS — C786 Secondary malignant neoplasm of retroperitoneum and peritoneum: Secondary | ICD-10-CM

## 2017-03-16 DIAGNOSIS — Z452 Encounter for adjustment and management of vascular access device: Secondary | ICD-10-CM | POA: Diagnosis not present

## 2017-03-16 DIAGNOSIS — C189 Malignant neoplasm of colon, unspecified: Secondary | ICD-10-CM

## 2017-03-16 DIAGNOSIS — C7951 Secondary malignant neoplasm of bone: Secondary | ICD-10-CM

## 2017-03-16 DIAGNOSIS — C182 Malignant neoplasm of ascending colon: Secondary | ICD-10-CM

## 2017-03-16 LAB — COMPREHENSIVE METABOLIC PANEL
ALT: 26 U/L (ref 0–55)
AST: 102 U/L — ABNORMAL HIGH (ref 5–34)
Albumin: 2.4 g/dL — ABNORMAL LOW (ref 3.5–5.0)
Alkaline Phosphatase: 435 U/L — ABNORMAL HIGH (ref 40–150)
Anion Gap: 15 mEq/L — ABNORMAL HIGH (ref 3–11)
BUN: 12.7 mg/dL (ref 7.0–26.0)
CHLORIDE: 89 meq/L — AB (ref 98–109)
CO2: 25 meq/L (ref 22–29)
Calcium: 10.1 mg/dL (ref 8.4–10.4)
Creatinine: 0.7 mg/dL (ref 0.6–1.1)
EGFR: >90 mL/min/{1.73_m2} (ref >90)
GLUCOSE: 65 mg/dL — AB (ref 70–140)
POTASSIUM: 4 meq/L (ref 3.5–5.1)
SODIUM: 128 meq/L — AB (ref 136–145)
Total Bilirubin: 1.16 mg/dL (ref 0.20–1.20)
Total Protein: 6.1 g/dL — ABNORMAL LOW (ref 6.4–8.3)

## 2017-03-16 MED ORDER — HEPARIN SOD (PORK) LOCK FLUSH 100 UNIT/ML IV SOLN
500.0000 [IU] | Freq: Once | INTRAVENOUS | Status: AC
  Administered 2017-03-16: 500 [IU] via INTRAVENOUS
  Filled 2017-03-16: qty 5

## 2017-03-16 MED ORDER — SODIUM CHLORIDE 0.9% FLUSH
10.0000 mL | INTRAVENOUS | Status: DC | PRN
  Administered 2017-03-16: 10 mL via INTRAVENOUS
  Filled 2017-03-16: qty 10

## 2017-03-16 MED ORDER — ALPRAZOLAM 0.25 MG PO TABS: 0.2500 mg | ORAL_TABLET | Freq: Two times a day (BID) | ORAL | 0 refills | Status: AC | PRN

## 2017-03-16 NOTE — Progress Notes (Addendum)
[place] OFFICE PROGRESS NOTE   Diagnosis:  Colon cancer  INTERVAL HISTORY:   Martha Becker returns as scheduled. She continues to have low back and left leg pain. She takes MS Contin 15 mg at bedtime. She sleeps through the night. When she wakes up next morning she takes ibuprofen. Around noon she takes oxycodone. Mid to late afternoon she becomes restless. She has periodic sweats. No fever. Appetite varies. She is trying to eat small frequent meals.  Objective:  Vital signs in last 24 hours:  Blood pressure 106/75, pulse (!) 117, temperature 97.8 F (36.6 C), temperature source Oral, resp. rate 18, height '5\' 6"'$  (1.676 m), weight 119 lb 11.2 oz (54.3 kg).    Resp: Lungs clear bilaterally. Cardio: Regular rate and rhythm. GI: Abdomen is soft. Mild tenderness at the right abdomen. Vascular: No leg edema. Calves soft and nontender. Neuro: Alert. She seems mildly confused.  Port-A-Cath without erythema.  Lab Results:  Lab Results  Component Value Date   WBC 12.7 (H) 02/14/2017   HGB 10.3 (L) 02/14/2017   HCT 31.3 (L) 02/14/2017   MCV 82.1 02/14/2017   PLT 358 02/14/2017   NEUTROABS 11.1 (H) 02/14/2017    Imaging:  No results found.  Medications: I have reviewed the patient's current medications.  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 no loss of mismatch repair protein expression, microsatellite stable ? 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 APC alteration detected ? Staging CT scans December 2015 and PET scan 09/22/2014 consistent with metastatic bilateral lung nodules, metastatic retroperitoneal lymphadenopathy, and a probable left liver metastasis ? Cycle 1 CAPOX 10/29/2014 (Xeloda discontinued day 9 secondary to diarrhea)  ? CT 11/14/2014 revealed progressive metastatic disease  at the lung bases and liver compared to a PET scan from 09/22/2014, mild improvement in retroperitoneal lymphadenopathy ? Cycle 1 FOLFOX 12/02/2014 ? Cycle 2 FOLFOX, Avastin added 12/16/2014 ? Cycle 3 FOLFOX/Avastin 12/30/2014 ? Cycle 4 FOLFOX/Avastin 01/13/2015 ? Cycle 5 FOLFOX/Avastin 01/27/2015  CT 02/10/2015 with improvement in hepatic metastases, mild improvement of lung metastases and retroperitoneal lymphadenopathy  Cycle 6 FOLFOX/Avastin 02/16/2015  Cycle 7 FOLFOX/Avastin 03/02/2015  Cycle 8 FOLFOX/Avastin 03/16/2015  Cycle 9 FOLFOX/Avastin 04/13/2015  Restaging CTs 04/20/2015 with a decrease in the size of pulmonary nodules, liver lesions, and retroperitoneal lymph nodes  Restaging CTs 07/27/2015 revealed enlargement of bilateral lung nodules, stable liver lesions, decreased periaortic adenopathy  CTs 10/05/2015 revealed further enlargement of bilateral lung nodules ,enlargement of liver lesions, and enlargement of peritoneal lymph nodes  Cycle 1 FOLFIRI/Avastin 10/12/2015  Cycle 2 FOLFIRI/Avastin 10/26/2015  Cycle 3 FOLFIRI/Avastin 11/09/2015  Cycle 4 FOLFIRI/Avastin 11/23/2015  Cycle 5 FOLFIRI/Avastin 12/28/2015  CTs 01/06/2016-decreased size of lung nodules , decreased / regression of liver lesions, regression of retroperitoneal nodes  Cycle 6 FOLFIRI/Avastin 01/11/2016 (Neulasta added)   Cycle 7 FOLFIRI/Avastin 02/01/2016 with Neulasta support  Cycle 8 FOLFIRI/Avastin 02/22/2016 with Neulasta support  Cycle 9 FOLFIRI/Avastin 03/13/2016 with Neulasta support  Cycle 10 FOLFIRI/Avastin 04/04/2016 with Neulasta support  Restaging CTs 04/17/2016-decreased size of lung and liver lesions  Patient decided on treatment break  Restaging CTs 08/22/2016-marked progression in the lungs and liver  Cycle 1 FOLFIRI/Avastin 09/25/2016  Cycle 2 FOLFIRI/Avastin 10/18/2016  Cycle 3 FOLFIRI/Avastin 11/08/2016  Restaging CTs 11/23/2016-stable lung metastases,  progression of hepatic metastases, increased right paratracheal node, seventh posterior lateral right rib fracture, T6 lucency-new, new left  sacral lesion  Cycle 4 FOLFIRI/Avastin 11/29/2016  Cycle 5 FOLFIRI/Avastin 12/20/2016  Cycle 6 FOLFIRI/Avastin 01/10/2017 (Neulasta held)  Restaging CTs 01/25/2017-mild improvement in pulmonary and hepatic metastases. Similar supraclavicular/low jugular adenopathy. Decrease in abdominal adenopathy. Persistent osseous metastasis.  PET scan 02/24/2017-stable liver lesions, progressive lung masses, widespread bone metastases  2. Elevated TSH, PET scan 09/22/2014 with diffuse increased metabolic uptake in the thyroid   3. Family history of multiple cancers including colon cancer in her paternal grandfather and rectal cancer in a paternal first cousin  55. Admission 11/14/2014 with nausea and diarrhea, most likely secondary to capecitabine induced enteritis-resolved. CT 11/14/2014 consistent with ileitis  5.Superficial phlebitis left hand/wrist 12/30/2014.  6. Early oxaliplatin neuropathy  7. Pain at the right gluteus and right leg-MRI of the lumbar spine 07/13/2015 confirmed a disc protrusion at L5-S1  8. Diaphoresis, rhinorrhea, shortness of breath and cough following cycle 1 FOLFIRI/Avastin, did not occur following cycle 2  9. Oral mucositis secondary to chemotherapy  10. Nonproductive coughsecondary to metastatic colon cancer  11. Neutropenia secondary to chemotherapy -Neulasta added with cycle 6 FOLFIRI 01/11/2016  12. Hospitalization 10/24/2016 through 10/25/2016 with hypotension related to dehydration, anemia. Hemoglobin improved 11/08/2016.  13. Hyponatremia. 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  14. Increased salivation-etiology unclear, she will try Protonix.Improved 02/14/2017.  15. Intermittent pain-potentially related to Neulasta, Neulasta held with the cycle of  chemotherapy given 01/10/2017  16. Dental abscess status post evaluation by dental medicine. She continues doxycycline.  17. Low back, left hip and leg pain.     Disposition: Martha Becker continues to decline. She is enrolled in the hospice program. Her pain overall is fairly well controlled. She will continue MS Contin at bedtime, ibuprofen and oxycodone during the day as needed. For the restlessness/agitation in the afternoons she will try Xanax 0.25 mg. She will return for a follow-up visit in approximately 4 weeks.  Patient seen with Dr. Benay Spice.  Ned Card ANP/GNP-BC   03/16/2017  4:05 PM  This was a shared visit with Ned Card. Martha Becker is now enrolled in the [facility] hospice program. She will continue the current narcotic regimen. [name], M.D.

## 2017-03-18 ENCOUNTER — Telehealth: Payer: Self-pay | Admitting: Oncology

## 2017-03-18 NOTE — Telephone Encounter (Signed)
/  w pt, gave appt for 04/13/17 @ 11.15am. Pt says no one has called in her script and she needs it asap. Pt asks that I ask desk nurse to call in her Xanax script to [facility] in [place]. Msg to desk RN.

## 2017-03-19 ENCOUNTER — Telehealth: Payer: Self-pay | Admitting: *Deleted

## 2017-03-19 ENCOUNTER — Other Ambulatory Visit: Payer: Self-pay

## 2017-03-19 NOTE — Telephone Encounter (Signed)
Telephone call to patient- gave lab results as directed below, understanding verbalized. Patient thanked [place] for the call, she knows to call us with and concerns or questions in the interm

## 2017-03-19 NOTE — Telephone Encounter (Signed)
-----   Message from Owens Shark, NP sent at 03/19/2017  8:54 AM EDT ----- Please let her know sodium is 128.

## 2017-03-26 ENCOUNTER — Telehealth: Payer: Self-pay | Admitting: Oncology

## 2017-03-26 NOTE — Telephone Encounter (Signed)
Funeral home called and wanted to know if Dr Benay Spice would sign the death certificate for Martha Becker

## 2017-04-11 DEATH — deceased

## 2017-04-13 ENCOUNTER — Ambulatory Visit: Payer: 59 | Admitting: Oncology

## 2017-11-08 NOTE — Telephone Encounter (Signed)
REVIEWED. PT DECEASED. 

## 2018-10-26 ENCOUNTER — Encounter: Payer: Self-pay | Admitting: Oncology

## 2018-10-26 ENCOUNTER — Other Ambulatory Visit: Payer: Self-pay | Admitting: Nurse Practitioner
